# Patient Record
Sex: Female | Born: 1965 | Race: Black or African American | Hispanic: No | Smoking: Former smoker
Health system: Southern US, Community
[De-identification: ages and names within clinical notes are randomized; demographics above are authoritative.]

## PROBLEM LIST (undated history)

## (undated) DIAGNOSIS — M549 Dorsalgia, unspecified: Secondary | ICD-10-CM

## (undated) DIAGNOSIS — J449 Chronic obstructive pulmonary disease, unspecified: Secondary | ICD-10-CM

## (undated) DIAGNOSIS — F419 Anxiety disorder, unspecified: Secondary | ICD-10-CM

## (undated) DIAGNOSIS — F4310 Post-traumatic stress disorder, unspecified: Secondary | ICD-10-CM

## (undated) DIAGNOSIS — N200 Calculus of kidney: Secondary | ICD-10-CM

## (undated) DIAGNOSIS — G8929 Other chronic pain: Secondary | ICD-10-CM

## (undated) DIAGNOSIS — J439 Emphysema, unspecified: Secondary | ICD-10-CM

## (undated) DIAGNOSIS — M792 Neuralgia and neuritis, unspecified: Secondary | ICD-10-CM

## (undated) DIAGNOSIS — M797 Fibromyalgia: Secondary | ICD-10-CM

## (undated) HISTORY — PX: TONSILLECTOMY: SUR1361

## (undated) HISTORY — PX: TUBAL LIGATION: SHX77

## (undated) HISTORY — PX: OTHER SURGICAL HISTORY: SHX169

## (undated) SURGERY — EGD (ESOPHAGOGASTRODUODENOSCOPY)
Anesthesia: Monitor Anesthesia Care

---

## 2002-03-16 ENCOUNTER — Emergency Department (HOSPITAL_COMMUNITY): Admission: EM | Admit: 2002-03-16 | Discharge: 2002-03-17 | Payer: Self-pay | Admitting: *Deleted

## 2002-04-28 ENCOUNTER — Emergency Department (HOSPITAL_COMMUNITY): Admission: EM | Admit: 2002-04-28 | Discharge: 2002-04-28 | Payer: Self-pay | Admitting: Emergency Medicine

## 2002-04-28 ENCOUNTER — Encounter: Payer: Self-pay | Admitting: Emergency Medicine

## 2002-06-22 ENCOUNTER — Emergency Department (HOSPITAL_COMMUNITY): Admission: EM | Admit: 2002-06-22 | Discharge: 2002-06-23 | Payer: Self-pay | Admitting: Emergency Medicine

## 2002-06-23 ENCOUNTER — Encounter: Payer: Self-pay | Admitting: Emergency Medicine

## 2004-01-05 ENCOUNTER — Emergency Department (HOSPITAL_COMMUNITY): Admission: EM | Admit: 2004-01-05 | Discharge: 2004-01-05 | Payer: Self-pay | Admitting: Emergency Medicine

## 2006-06-02 ENCOUNTER — Emergency Department (HOSPITAL_COMMUNITY): Admission: EM | Admit: 2006-06-02 | Discharge: 2006-06-03 | Payer: Self-pay | Admitting: Emergency Medicine

## 2006-11-03 ENCOUNTER — Emergency Department (HOSPITAL_COMMUNITY): Admission: EM | Admit: 2006-11-03 | Discharge: 2006-11-04 | Payer: Self-pay | Admitting: Emergency Medicine

## 2007-01-01 ENCOUNTER — Emergency Department (HOSPITAL_COMMUNITY): Admission: EM | Admit: 2007-01-01 | Discharge: 2007-01-01 | Payer: Self-pay | Admitting: Emergency Medicine

## 2007-08-01 ENCOUNTER — Emergency Department (HOSPITAL_COMMUNITY): Admission: EM | Admit: 2007-08-01 | Discharge: 2007-08-01 | Payer: Self-pay | Admitting: Emergency Medicine

## 2007-10-12 ENCOUNTER — Encounter: Admission: RE | Admit: 2007-10-12 | Discharge: 2007-10-12 | Payer: Self-pay | Admitting: Orthopedic Surgery

## 2008-04-23 ENCOUNTER — Emergency Department (HOSPITAL_COMMUNITY): Admission: EM | Admit: 2008-04-23 | Discharge: 2008-04-23 | Payer: Self-pay | Admitting: Emergency Medicine

## 2009-01-05 ENCOUNTER — Emergency Department (HOSPITAL_COMMUNITY): Admission: EM | Admit: 2009-01-05 | Discharge: 2009-01-05 | Payer: Self-pay | Admitting: Emergency Medicine

## 2009-09-18 ENCOUNTER — Emergency Department (HOSPITAL_COMMUNITY): Admission: EM | Admit: 2009-09-18 | Discharge: 2009-09-18 | Payer: Self-pay | Admitting: Emergency Medicine

## 2009-11-14 ENCOUNTER — Emergency Department (HOSPITAL_COMMUNITY): Admission: EM | Admit: 2009-11-14 | Discharge: 2009-11-14 | Payer: Self-pay | Admitting: Emergency Medicine

## 2010-09-16 ENCOUNTER — Emergency Department (HOSPITAL_COMMUNITY): Admission: EM | Admit: 2010-09-16 | Discharge: 2010-09-16 | Payer: Self-pay | Admitting: Emergency Medicine

## 2011-03-12 LAB — COMPREHENSIVE METABOLIC PANEL
AST: 12 U/L (ref 0–37)
Alkaline Phosphatase: 47 U/L (ref 39–117)
Calcium: 8.6 mg/dL (ref 8.4–10.5)
GFR calc non Af Amer: >60 mL/min (ref >60)
Glucose, Bld: 83 mg/dL (ref 70–99)
Potassium: 3.4 mEq/L — ABNORMAL LOW (ref 3.5–5.1)
Sodium: 145 mEq/L (ref 135–145)
Total Bilirubin: 0.8 mg/dL (ref 0.3–1.2)

## 2011-03-12 LAB — DIFFERENTIAL
Eosinophils Absolute: 0 10*3/uL (ref 0.0–0.7)
Eosinophils Relative: 0 % (ref 0–5)
Lymphocytes Relative: 16 % (ref 12–46)
Lymphs Abs: 1.2 10*3/uL (ref 0.7–4.0)

## 2011-03-12 LAB — URINALYSIS, ROUTINE W REFLEX MICROSCOPIC
Glucose, UA: NEGATIVE mg/dL
Nitrite: NEGATIVE
Urobilinogen, UA: 0.2 mg/dL (ref 0.0–1.0)
pH: 6 (ref 5.0–8.0)

## 2011-03-12 LAB — CBC
HCT: 42.3 % (ref 36.0–46.0)
RBC: 4.4 MIL/uL (ref 3.87–5.11)

## 2011-03-12 LAB — PREGNANCY, URINE: Preg Test, Ur: NEGATIVE

## 2011-03-14 LAB — POCT I-STAT, CHEM 8
Creatinine, Ser: 1 mg/dL (ref 0.4–1.2)
HCT: 53 % — ABNORMAL HIGH (ref 36.0–46.0)
Hemoglobin: 18 g/dL — ABNORMAL HIGH (ref 12.0–15.0)
Sodium: 143 mEq/L (ref 135–145)

## 2011-03-25 LAB — DIFFERENTIAL
Basophils Absolute: 0 10*3/uL (ref 0.0–0.1)
Basophils Relative: 1 % (ref 0–1)
Eosinophils Absolute: 0.1 10*3/uL (ref 0.0–0.7)
Eosinophils Relative: 1 % (ref 0–5)
Lymphocytes Relative: 13 % (ref 12–46)
Lymphs Abs: 1.2 10*3/uL (ref 0.7–4.0)
Monocytes Absolute: 1.2 10*3/uL — ABNORMAL HIGH (ref 0.1–1.0)
Monocytes Relative: 12 % (ref 3–12)
Neutro Abs: 7.4 10*3/uL (ref 1.7–7.7)
Neutrophils Relative %: 74 % (ref 43–77)

## 2011-03-25 LAB — BASIC METABOLIC PANEL
BUN: 6 mg/dL (ref 6–23)
Calcium: 9.1 mg/dL (ref 8.4–10.5)
Chloride: 112 mEq/L (ref 96–112)
Creatinine, Ser: 0.66 mg/dL (ref 0.4–1.2)
GFR calc Af Amer: >60 mL/min (ref >60)
Glucose, Bld: 88 mg/dL (ref 70–99)
Sodium: 141 mEq/L (ref 135–145)

## 2011-03-25 LAB — CBC: RDW: 13.5 % (ref 11.5–15.5)

## 2011-03-25 LAB — POCT CARDIAC MARKERS: CKMB, poc: <1 ng/mL — ABNORMAL LOW (ref 1.0–8.0)

## 2011-04-16 ENCOUNTER — Inpatient Hospital Stay (HOSPITAL_COMMUNITY)
Admission: EM | Admit: 2011-04-16 | Discharge: 2011-04-18 | DRG: 897 | Disposition: A | Discharge status: Home / Self Care | Payer: Medicare Other | Attending: Internal Medicine | Admitting: Internal Medicine

## 2011-04-16 DIAGNOSIS — F4310 Post-traumatic stress disorder, unspecified: Secondary | ICD-10-CM | POA: Diagnosis present

## 2011-04-16 DIAGNOSIS — G4700 Insomnia, unspecified: Secondary | ICD-10-CM | POA: Diagnosis present

## 2011-04-16 DIAGNOSIS — F17200 Nicotine dependence, unspecified, uncomplicated: Secondary | ICD-10-CM | POA: Diagnosis present

## 2011-04-16 DIAGNOSIS — D72829 Elevated white blood cell count, unspecified: Secondary | ICD-10-CM | POA: Diagnosis present

## 2011-04-16 DIAGNOSIS — E860 Dehydration: Secondary | ICD-10-CM | POA: Diagnosis present

## 2011-04-16 DIAGNOSIS — F19939 Other psychoactive substance use, unspecified with withdrawal, unspecified: Principal | ICD-10-CM | POA: Diagnosis present

## 2011-04-16 DIAGNOSIS — E876 Hypokalemia: Secondary | ICD-10-CM | POA: Diagnosis present

## 2011-04-16 DIAGNOSIS — J309 Allergic rhinitis, unspecified: Secondary | ICD-10-CM | POA: Diagnosis present

## 2011-04-16 DIAGNOSIS — IMO0001 Reserved for inherently not codable concepts without codable children: Secondary | ICD-10-CM | POA: Diagnosis present

## 2011-04-16 DIAGNOSIS — N179 Acute kidney failure, unspecified: Secondary | ICD-10-CM | POA: Diagnosis present

## 2011-04-16 DIAGNOSIS — G589 Mononeuropathy, unspecified: Secondary | ICD-10-CM | POA: Diagnosis present

## 2011-04-16 DIAGNOSIS — F341 Dysthymic disorder: Secondary | ICD-10-CM | POA: Diagnosis present

## 2011-04-16 DIAGNOSIS — Z79899 Other long term (current) drug therapy: Secondary | ICD-10-CM

## 2011-04-16 DIAGNOSIS — F1120 Opioid dependence, uncomplicated: Secondary | ICD-10-CM | POA: Diagnosis present

## 2011-04-16 DIAGNOSIS — G8929 Other chronic pain: Secondary | ICD-10-CM | POA: Diagnosis present

## 2011-04-16 LAB — DIFFERENTIAL
Basophils Absolute: 0 10*3/uL (ref 0.0–0.1)
Basophils Relative: 0 % (ref 0–1)
Eosinophils Absolute: 0 10*3/uL (ref 0.0–0.7)
Lymphocytes Relative: 16 % (ref 12–46)
Monocytes Absolute: 1.1 10*3/uL — ABNORMAL HIGH (ref 0.1–1.0)

## 2011-04-16 LAB — URINE MICROSCOPIC-ADD ON

## 2011-04-16 LAB — COMPREHENSIVE METABOLIC PANEL
ALT: 15 U/L (ref 0–35)
AST: 17 U/L (ref 0–37)
Albumin: 5.7 g/dL — ABNORMAL HIGH (ref 3.5–5.2)
Alkaline Phosphatase: 92 U/L (ref 39–117)
Creatinine, Ser: 1.94 mg/dL — ABNORMAL HIGH (ref 0.4–1.2)
Glucose, Bld: 129 mg/dL — ABNORMAL HIGH (ref 70–99)

## 2011-04-16 LAB — CBC
HCT: 51.7 % — ABNORMAL HIGH (ref 36.0–46.0)
Hemoglobin: 18 g/dL — ABNORMAL HIGH (ref 12.0–15.0)
MCH: 32.3 pg (ref 26.0–34.0)
MCV: 92.8 fL (ref 78.0–100.0)
RBC: 5.57 MIL/uL — ABNORMAL HIGH (ref 3.87–5.11)
WBC: 15.6 10*3/uL — ABNORMAL HIGH (ref 4.0–10.5)

## 2011-04-16 LAB — URINALYSIS, ROUTINE W REFLEX MICROSCOPIC
Leukocytes, UA: NEGATIVE
Specific Gravity, Urine: 1.022 (ref 1.005–1.030)

## 2011-04-16 LAB — LIPASE, BLOOD: Lipase: 26 U/L (ref 11–59)

## 2011-04-17 LAB — URINE CULTURE
Colony Count: 50000
Culture  Setup Time: 201205081501

## 2011-04-17 LAB — COMPREHENSIVE METABOLIC PANEL
ALT: 9 U/L (ref 0–35)
Albumin: 2.8 g/dL — ABNORMAL LOW (ref 3.5–5.2)
BUN: 13 mg/dL (ref 6–23)
Chloride: 119 mEq/L — ABNORMAL HIGH (ref 96–112)
Potassium: 3.3 mEq/L — ABNORMAL LOW (ref 3.5–5.1)
Sodium: 143 mEq/L (ref 135–145)
Total Protein: 5.4 g/dL — ABNORMAL LOW (ref 6.0–8.3)

## 2011-04-17 LAB — POCT PREGNANCY, URINE: Preg Test, Ur: NEGATIVE

## 2011-04-18 LAB — BASIC METABOLIC PANEL
Calcium: 8.6 mg/dL (ref 8.4–10.5)
GFR calc Af Amer: >60 mL/min (ref >60)
GFR calc non Af Amer: >60 mL/min (ref >60)
Glucose, Bld: 80 mg/dL (ref 70–99)
Potassium: 4 mEq/L (ref 3.5–5.1)
Sodium: 139 mEq/L (ref 135–145)

## 2011-04-18 LAB — CBC
Platelets: 143 10*3/uL — ABNORMAL LOW (ref 150–400)
WBC: 7 10*3/uL (ref 4.0–10.5)

## 2011-05-01 NOTE — Discharge Summary (Signed)
Michele Lucas, Michele Lucas               ACCOUNT NO.:  [id number]  MEDICAL RECORD NO.:  [id number]           PATIENT TYPE:  I  LOCATION:  1511                         FACILITY[facility]  [facility]  PHYSICIAN:  Rosanna Randy, MDDATE OF BIRTH:  1966-04-27  DATE OF ADMISSION:  04/16/2011 DATE OF DISCHARGE:  04/18/2011                              DISCHARGE SUMMARY   PRIMARY CARE PHYSICIAN:  Dr. Clare Gandy at [facility]  DISCHARGE DIAGNOSES: 1. Nausea, vomiting, diarrhea secondary to opiate withdrawal. 2. Dehydration with acute kidney injury. 3. Hypokalemia. 4. Depression. 5. Anxiety. 6. Chronic back pain, on chronic narcotic use. 7. Fibromyalgia. 8. Post-traumatic stress disorder. 9. Neuropathy. 10.Tobacco abuse. 11.Insomnia. 12.Allergic rhinitis. 13.Reactive leukocytosis.  DISCHARGE MEDICATIONS: 1. Claritin 10 mg by mouth daily. 2. Lyrica 75 mg by mouth twice a day. 3. Ambien 10 mg 1 tablet by mouth daily at bedtime as needed for     insomnia. 4. Remeron 15 mg 1 tablet by mouth daily at bedtime. 5. Morphine sulfate 15 mg tablet to be taken every 8 hours schedule. 6. Oxycodone 5 mg 1 tablet by mouth 4 times a day as needed for     breakthrough pain. 7. Sertraline 50 mg 1 tablet by mouth daily.  DISPOSITION AND FOLLOWUP:  The patient has been discharged in stable and improved condition, currently not having any nausea, vomiting or diarrhea; able to tolerate full diet by mouth and is also not having any electrolyte abnormalities with completely back to normal renal function. The patient had been instructed to arrange a followup appointment with her primary care physician Dr. Charlott Holler in 5 to 10 days in order to review her chronic medical problems and to have any adjustment on her medications.  It is going to be also important during that appointment to review necessity of either a higher dose for her pain control which is still present and also to calculate the amount of narcotics  that she need to receive in order to prevent that the patient ran out earlier and experienced any symptoms similar to this opiate withdrawal that she just went through.  PROCEDURES:  Procedures performed during this hospitalization:  No procedures were performed during this admission.  No consultations were made during this admission.  BRIEF HISTORY OF PRESENT ILLNESS:  Michele Lucas is a 45 year old African- American female with a history of chronic back pain and chronic opiate use who presents with 1-week history of nausea, vomiting, diarrhea and abdominal pain.  The patient reports that she stopped her opiate medications about a week ago when she ran out of her pills.  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  She had gone to see them on April 25 at which time her medications had been refilled and were reportedly going to be mailed to her but they never arrived.  She therefore stopped her medications as previously stated one week ago when she ran out.  All the symptoms began following that.  The patient has been unable to eat or drink significantly since that time secondary to her associated symptoms.  The patient denies any fevers or any dysuria.  She denies any other symptoms like headache, chest pain, shortness of breath.  Also denies any blood in her stools.  The patient reports that she also has had some upper respiratory type of symptoms including nasal congestion and dry cough with some postnasal drip.  ADMISSION PHYSICAL EXAMINATION:  VITAL SIGNS:  On admission, the patient's vital signs demonstrated a temperature of 98.3 with a heart rate of 130, blood pressure 113/79, respiratory rate 20, oxygen saturation 99% on room air. GENERAL:  In general the patient was in moderate distress. HEENT:  With dry mucous membranes.  Normocephalic and without any trauma.  There was no ectasia.  No exudates or erythema inside her mouth. CARDIOVASCULAR EXAM:  Tachycardic, regular  rhythm.  No murmurs, rubs or gallops. LUNGS:  Clear to auscultation with good air movement and just scattered wheezing. ABDOMEN:  Soft, mildly tender to palpation, nondistended with positive bowel sounds. EXTREMITIES:  Able to move all extremities with no cyanosis, clubbing or edema. NEUROLOGIC EXAM:  Unremarkable.  PERTINENT LABORATORY DATA:  During this hospitalization on admission the patient had a CBC that demonstrated white blood cells of 15.6, hemoglobin 18, platelets 243.  She also had a comprehensive metabolic panel on admission that demonstrated a sodium of 138, potassium 3.4, chloride 100, bicarb 20, blood sugar 129, BUN 25, creatinine 1.94 with a GFR of 34.  LFTs were completely normal except for mild elevation in the patient's albumin up to 5.7 and also a total protein of 10.0.  Lipase was 26.  Urinalysis was completely normal.  Urine pregnancy was negative.  There was a negative urine culture.  Subsequent comprehensive metabolic panel the next day after admission demonstrated a sodium of 143 with a potassium of 3.3 and chloride of 119 with a bicarb of 17, glucose of 88, BUN 13 with a creatinine of 0.51, now her GFR was more than 60 and her liver function tests were now back to normal.  HOSPITAL COURSE BY PROBLEM: 1. Nausea, vomiting, diarrhea secondary to opiate withdrawal.  The     patient was restarted on her opiates initially IV where she was     unable to take p.o.'s with subsequent advance in her diet and also     medications by mouth.  Her symptoms rapidly responded.  She was no     longer having any nausea, vomiting or diarrhea and she was already     feeling a lot much better after fluid resuscitation and     electrolytes repletion.  The patient had received a prescription     for 1 month supply and has been instructed to follow with her     primary care physician for the next 5 to 10 days for further     adjustment of her medications and to get further refills  on her     meds.. 2. Dehydration with acute kidney injury, looking everything to be     secondary to prerenal azotemia.  At this point, the patient has     received fluid resuscitation and electrolytes repletion and at     discharge the patient's renal function and also electrolytes are     back to normal range. 3. Hypokalemia which is secondary to combination of volume contraction     and her diarrhea due to opiate withdrawal.  She received     electrolyte repletion throughout this admission and her potassium     is normal at discharge. 4. Depression and anxiety.  Plan is to continue using  mirtazapine and     Zoloft. 5. Chronic back pain.  She is going to continue follow with her     primary care physician for this problem and is going to be using     morphine sulfate 3 times a day with oxycodone p.r.n. for     breakthrough pain. 6. Fibromyalgia and neuropathy.  She is going to be using Lyrica, the     dose has been adjusted to 75 mg by mouth twice a day due to the     component of fibromyalgia as one of her problems.. 7. Tobacco abuse.  She received counseling throughout this admission     and was placed on nicotine patch.  She is planning to quit and we     have really encouraged her to get further assistance as an  outpatient.  Resources were provided by social work. 8. Insomnia.  We are going to continue using p.r.n. zolpidem. 9. Allergic rhinitis.  We are going to continue using Claritin daily. 10.Reactive leukocytosis all secondary to stress and demargination     which completely returned to normal after fluid resuscitation.  DISCHARGE PHYSICAL EXAMINATION:  VITAL SIGNS:  At discharge, the patient's vital signs were temperature 99.2, heart rate 76, respiratory rate 20, blood pressure 148/73, oxygen saturation 98% on room air. GENERAL:  In general, she was in no acute distress. RESPIRATORY SYSTEM:  Clear to sensation bilaterally. HEART:  Regular rate and rhythm. ABDOMEN:   Soft, nontender, nondistended with positive bowel sounds. EXTREMITIES:  Without any edema. NEUROLOGIC EXAM:  Nonfocal.  LABORATORY DATA:  Her lab work at discharge demonstrated white blood cells of 7.0 with a hemoglobin of 12, platelets 150.  Basic metabolic panel showed a sodium of 139, potassium 4.0, chloride 114, bicarb 20, BUN 6, creatinine 0.47, blood sugar was 80.     Rosanna Randy, MD     CEM/MEDQ  D:  04/18/2011  T:  04/18/2011  Job:  [id number]  cc:   Dr. Clare Gandy at [facility]  Electronically Signed by Vassie Loll MD on 05/01/2011 10:20:32 PM

## 2011-09-21 ENCOUNTER — Emergency Department (HOSPITAL_COMMUNITY)
Admission: EM | Admit: 2011-09-21 | Discharge: 2011-09-21 | Disposition: A | Discharge status: Home / Self Care | Payer: Medicare Other | Attending: Emergency Medicine | Admitting: Emergency Medicine

## 2011-09-21 DIAGNOSIS — K08409 Partial loss of teeth, unspecified cause, unspecified class: Secondary | ICD-10-CM | POA: Insufficient documentation

## 2011-09-21 DIAGNOSIS — K029 Dental caries, unspecified: Secondary | ICD-10-CM | POA: Insufficient documentation

## 2011-09-21 DIAGNOSIS — K089 Disorder of teeth and supporting structures, unspecified: Secondary | ICD-10-CM | POA: Insufficient documentation

## 2011-09-21 DIAGNOSIS — K08109 Complete loss of teeth, unspecified cause, unspecified class: Secondary | ICD-10-CM | POA: Insufficient documentation

## 2011-10-07 ENCOUNTER — Emergency Department (HOSPITAL_COMMUNITY): Payer: Medicare Other

## 2011-10-07 ENCOUNTER — Emergency Department (HOSPITAL_COMMUNITY)
Admission: EM | Admit: 2011-10-07 | Discharge: 2011-10-07 | Disposition: A | Discharge status: Home / Self Care | Payer: Medicare Other | Attending: Emergency Medicine | Admitting: Emergency Medicine

## 2011-10-07 DIAGNOSIS — F4310 Post-traumatic stress disorder, unspecified: Secondary | ICD-10-CM | POA: Insufficient documentation

## 2011-10-07 DIAGNOSIS — G609 Hereditary and idiopathic neuropathy, unspecified: Secondary | ICD-10-CM | POA: Insufficient documentation

## 2011-10-07 DIAGNOSIS — F411 Generalized anxiety disorder: Secondary | ICD-10-CM | POA: Insufficient documentation

## 2011-10-07 DIAGNOSIS — IMO0001 Reserved for inherently not codable concepts without codable children: Secondary | ICD-10-CM | POA: Insufficient documentation

## 2011-10-07 DIAGNOSIS — Z7982 Long term (current) use of aspirin: Secondary | ICD-10-CM | POA: Insufficient documentation

## 2011-10-07 DIAGNOSIS — Z79899 Other long term (current) drug therapy: Secondary | ICD-10-CM | POA: Insufficient documentation

## 2011-10-07 DIAGNOSIS — F3289 Other specified depressive episodes: Secondary | ICD-10-CM | POA: Insufficient documentation

## 2011-10-07 DIAGNOSIS — J40 Bronchitis, not specified as acute or chronic: Secondary | ICD-10-CM | POA: Insufficient documentation

## 2011-10-07 DIAGNOSIS — F329 Major depressive disorder, single episode, unspecified: Secondary | ICD-10-CM | POA: Insufficient documentation

## 2011-10-07 LAB — DIFFERENTIAL
Basophils Absolute: 0 10*3/uL (ref 0.0–0.1)
Lymphocytes Relative: 21 % (ref 12–46)
Lymphs Abs: 1.8 10*3/uL (ref 0.7–4.0)
Neutro Abs: 6 10*3/uL (ref 1.7–7.7)
Neutrophils Relative %: 72 % (ref 43–77)

## 2011-10-07 LAB — BASIC METABOLIC PANEL
BUN: 11 mg/dL (ref 6–23)
Chloride: 106 mEq/L (ref 96–112)
GFR calc Af Amer: >90 mL/min (ref >90)
GFR calc non Af Amer: >90 mL/min (ref >90)
Glucose, Bld: 95 mg/dL (ref 70–99)
Potassium: 3.6 mEq/L (ref 3.5–5.1)
Sodium: 139 mEq/L (ref 135–145)

## 2011-10-07 LAB — URINALYSIS, ROUTINE W REFLEX MICROSCOPIC
Bilirubin Urine: NEGATIVE
Hgb urine dipstick: NEGATIVE
Specific Gravity, Urine: 1.028 (ref 1.005–1.030)
Urobilinogen, UA: 1 mg/dL (ref 0.0–1.0)
pH: 6 (ref 5.0–8.0)

## 2011-10-07 LAB — CBC
HCT: 39.7 % (ref 36.0–46.0)
Hemoglobin: 13.5 g/dL (ref 12.0–15.0)
MCV: 94.7 fL (ref 78.0–100.0)
RBC: 4.19 MIL/uL (ref 3.87–5.11)
RDW: 14.3 % (ref 11.5–15.5)
WBC: 8.4 10*3/uL (ref 4.0–10.5)

## 2011-10-18 ENCOUNTER — Ambulatory Visit (HOSPITAL_COMMUNITY)
Admission: RE | Admit: 2011-10-18 | Discharge: 2011-10-18 | Disposition: A | Discharge status: Home / Self Care | Payer: Medicare Other | Source: Ambulatory Visit | Attending: Psychiatry | Admitting: Psychiatry

## 2011-10-18 DIAGNOSIS — F1120 Opioid dependence, uncomplicated: Secondary | ICD-10-CM | POA: Insufficient documentation

## 2011-10-18 NOTE — BH Assessment (Signed)
Assessment Note   Michele Lucas is an 45 y.o. female.   Axis I: OPIOID DEPENDENCE Axis II: Deferred Axis III: No past medical history on file. Axis IV: problems with access to health care services Axis V: 51-60 moderate symptoms  Past Medical History: No past medical history on file.  No past surgical history on file.  Family History: No family history on file.  Social History:  does not have a smoking history on file. She does not have any smokeless tobacco history on file. Her alcohol and drug histories not on file.  Allergies: Allergies not on file  Home Medications:  No current outpatient prescriptions on file as of 10/18/2011.   No current facility-administered medications on file as of 10/18/2011.    OB/GYN Status:  No LMP recorded.  General Assessment Data Assessment Number: 1  Living Arrangements: Spouse/significant other;Children;Relatives Can pt return to current living arrangement?: Yes Admission Status: Voluntary Is patient capable of signing voluntary admission?: Yes Transfer from: Home Referral Source: Self/Family/Friend  Risk to self Suicidal Ideation: No Suicidal Intent: No-Not Currently/Within Last 6 Months Is patient at risk for suicide?: No Suicidal Plan?: No Access to Means: No What has been your use of drugs/alcohol within the last 12 months?: PT HAS BEEN DEPENDENT ON NARCATICS WHICH SHE HAS BEEN ON FOR MANY YRS Other Self Harm Risks: NA Triggers for Past Attempts: Other (Comment) (NA) Intentional Self Injurious Behavior: None Factors that decrease suicide risk: Positive coping skills Family Suicide History: Unknown Recent stressful life event(s): Other (Comment) (MEDICAL ISSUES, NARCOTICS DEPENDENCE) Persecutory voices/beliefs?: No Depression: Yes Depression Symptoms: Loss of interest in usual pleasures Substance abuse history and/or treatment for substance abuse?: Yes  Risk to Others Homicidal Ideation: No Thoughts of Harm to Others:  No Current Homicidal Plan: No Access to Homicidal Means: No Identified Victim:  (NA) History of harm to others?: No Assessment of Violence: None Noted Violent Behavior Description: NA Does patient have access to weapons?: No Criminal Charges Pending?: No Does patient have a court date: No  Mental Status Report Appear/Hygiene: Other (Comment) (NEAT) Eye Contact: Good Motor Activity: Unsteady Speech: Slurred;Slow Level of Consciousness: Alert;Drowsy Mood: Depressed;Sad;Preoccupied Affect: Depressed;Sad Anxiety Level: None Thought Processes: Coherent;Relevant Judgement: Impaired Orientation: Person;Place;Time;Situation Obsessive Compulsive Thoughts/Behaviors: None  Cognitive Functioning Concentration: Decreased Memory: Recent Intact;Remote Intact IQ: Average Insight: Poor Impulse Control: Poor Appetite: Poor Weight Loss:  (UNK) Weight Gain:  (UNK) Sleep: No Change Total Hours of Sleep:  (UNK) Vegetative Symptoms: None  Prior Inpatient/Outpatient Therapy Prior Therapy: Outpatient Prior Therapy Dates: UNK Prior Therapy Facilty/Provider(s): [facility] Reason for Treatment: THERAPY & MED MANAGEMENT   PT PRESENTED REQUESTING MEDICAL HELP TO WEENE OFF NARCOTICS MEDS THAT PCP/PROVIDER HAS PUT HER ON. PT STATES SHE DID NOT WANT TO EXPERIENCE ANY WITHDRAWAL & STATE SHE WOULD BE WILLING TO DO DETOX IF SPOUSE WAS ON IN HOSPITAL GETTING READY TO UNDERGO SURGERY. PT STATES SHE HAS A LOT OF MEDICAL ISSUES WHICH SHE HAS BEEN DEALING WITH ALONG TIME. PT ADMITS BEING ON OXYCODONE & MORPHINE WHICH SHE WANTS TO GET OFF. PT ADMITS TO HAVING CRYING SPELLS SOMETIMES & SUICIDAL THOUGHTS WHICH COME & GO. PT ADMITS STILL DEALING WITH CHILDHOOD ABUSE. PT RANKS HER PAIN AT A LEVEL OF 22. PT SAYS SHE IS AFRAID TO HAVE BACK SURGERY DUE TO POSSIBLE SIDE AFFECTS SHE MAY HAVE. PT STATES SHE HAS BEEN DIGNOISED WITH PTSD; ENDOMETREOSIS; FIBROMYALAGIA; NEUROPATHY; CHRONIC BACK PAIN & ANXIETY D/O. PT WAS  INFORMED THAT [facility] DID NOT WEENE OFF NARCOTICS &  WOULD BE REFERRED TO PROVIDER. PT EXPRESSED THAT SHE WANTED TO BE IN A LONG TERM DETOX PROGRAM. PT WAS GIVEN INFORMATION ABOUT OUT CD/PSYCH IOP. PT STATES SHE WOULD GO BACK [facility] FOR SERVICE                  Additional Information 1:1 In Past 12 Months?: No CIRT Risk: No Elopement Risk: No Does patient have medical clearance?: No     Disposition:  Disposition Disposition of Patient: Referred to;Outpatient treatment [facility]) Patient referred to: Other (Comment) ( [facility])  On Site Evaluation by:   Reviewed with Physician:     Waldron Session 10/18/2011 7:12 PM

## 2012-03-15 ENCOUNTER — Emergency Department (HOSPITAL_COMMUNITY)
Admission: EM | Admit: 2012-03-15 | Discharge: 2012-03-15 | Disposition: A | Discharge status: Home / Self Care | Payer: Medicare Other | Attending: Emergency Medicine | Admitting: Emergency Medicine

## 2012-03-15 ENCOUNTER — Encounter (HOSPITAL_COMMUNITY): Payer: Self-pay | Admitting: Emergency Medicine

## 2012-03-15 DIAGNOSIS — K0510 Chronic gingivitis, plaque induced: Secondary | ICD-10-CM | POA: Insufficient documentation

## 2012-03-15 DIAGNOSIS — J4489 Other specified chronic obstructive pulmonary disease: Secondary | ICD-10-CM | POA: Insufficient documentation

## 2012-03-15 DIAGNOSIS — G8929 Other chronic pain: Secondary | ICD-10-CM | POA: Insufficient documentation

## 2012-03-15 DIAGNOSIS — K0889 Other specified disorders of teeth and supporting structures: Secondary | ICD-10-CM

## 2012-03-15 DIAGNOSIS — K089 Disorder of teeth and supporting structures, unspecified: Secondary | ICD-10-CM | POA: Insufficient documentation

## 2012-03-15 DIAGNOSIS — J449 Chronic obstructive pulmonary disease, unspecified: Secondary | ICD-10-CM | POA: Insufficient documentation

## 2012-03-15 HISTORY — DX: Anxiety disorder, unspecified: F41.9

## 2012-03-15 HISTORY — DX: Fibromyalgia: M79.7

## 2012-03-15 HISTORY — DX: Chronic obstructive pulmonary disease, unspecified: J44.9

## 2012-03-15 HISTORY — DX: Post-traumatic stress disorder, unspecified: F43.10

## 2012-03-15 HISTORY — DX: Other chronic pain: G89.29

## 2012-03-15 HISTORY — DX: Neuralgia and neuritis, unspecified: M79.2

## 2012-03-15 HISTORY — DX: Dorsalgia, unspecified: M54.9

## 2012-03-15 MED ORDER — OXYCODONE-ACETAMINOPHEN 5-325 MG PO TABS
ORAL_TABLET | ORAL | Status: AC
  Filled 2012-03-15: qty 1

## 2012-03-15 MED ORDER — OXYCODONE-ACETAMINOPHEN 5-325 MG PO TABS
1.0000 | ORAL_TABLET | Freq: Once | ORAL | Status: AC
  Administered 2012-03-15: 1 via ORAL
  Filled 2012-03-15: qty 1

## 2012-03-15 MED ORDER — CLINDAMYCIN HCL 300 MG PO CAPS
300.0000 mg | ORAL_CAPSULE | Freq: Once | ORAL | Status: AC
  Administered 2012-03-15: 300 mg via ORAL
  Filled 2012-03-15: qty 1

## 2012-03-15 MED ORDER — CLINDAMYCIN HCL 150 MG PO CAPS: 150.0000 mg | ORAL_CAPSULE | Freq: Four times a day (QID) | ORAL | Status: AC

## 2012-03-15 MED ORDER — CHLORHEXIDINE GLUCONATE 0.12 % MT SOLN: 15.0000 mL | Freq: Two times a day (BID) | OROMUCOSAL | Status: AC

## 2012-03-15 MED ORDER — OXYCODONE-ACETAMINOPHEN 5-325 MG PO TABS
1.0000 | ORAL_TABLET | Freq: Once | ORAL | Status: AC
  Administered 2012-03-15: 1 via ORAL

## 2012-03-15 MED ORDER — OXYCODONE-ACETAMINOPHEN 5-325 MG PO TABS: 1.0000 | ORAL_TABLET | Freq: Four times a day (QID) | ORAL | Status: AC | PRN

## 2012-03-15 NOTE — ED Notes (Signed)
Pt presented to the ER with dental pain, states pain started about a week ago, however, not able to make an appt until Monday, pain increasing, 10/10 at this time, pt states that 7 teeth that she has left in the mouth are all painful and infected.

## 2012-03-15 NOTE — Discharge Instructions (Signed)
Return to the ED with any concerns including fever, difficulty breathing or swallowing, swelling under your tongue, vomiting and not able to keep down liquids or antibiotics, or any other alarming symptoms

## 2012-03-15 NOTE — ED Provider Notes (Signed)
History     CSN: [id number]  Arrival date & time 03/15/12  0001   First MD Initiated Contact with Patient 03/15/12 0321      Chief Complaint  Patient presents with  . Dental Pain    (Consider location/radiation/quality/duration/timing/severity/associated sxs/prior treatment) HPI PT presents with c/o dental pain.  She has widespread dental decay and gum pain.  She has had pain over the past several days, has been trying ibuprofen without any relief.  No fever, no difficulty swallowing or breathing.  Pt has had to have multiple teeth pulled in the past and needs to have the remaining 7 teeth pulled as well. She states she has an appointment in 2 days with her dentist.    Past Medical History  Diagnosis Date  . Endometriosis   . PTSD (post-traumatic stress disorder)   . Neuropathic pain   . Chronic back pain   . Fibromyalgia   . COPD, mild   . Anxiety     Past Surgical History  Procedure Date  . Tubal ligation   . Copolscopy   . Tonsillectomy   . Cesarean section     History reviewed. No pertinent family history.  History  Substance Use Topics  . Smoking status: Current Everyday Smoker  . Smokeless tobacco: Not on file  . Alcohol Use: Yes    OB History    Grav Para Term Preterm Abortions TAB SAB Ect Mult Living                  Review of Systems ROS reviewed and all otherwise negative except for mentioned in HPI  Allergies  Ampicillin  Home Medications   Current Outpatient Rx  Name Route Sig Dispense Refill  . CHLORHEXIDINE GLUCONATE 0.12 % MT SOLN Mouth/Throat Use as directed 15 mLs in the mouth or throat 2 (two) times daily. 120 mL 0  . CLINDAMYCIN HCL 150 MG PO CAPS Oral Take 1 capsule (150 mg total) by mouth every 6 (six) hours. 28 capsule 0  . OXYCODONE-ACETAMINOPHEN 5-325 MG PO TABS Oral Take 1-2 tablets by mouth every 6 (six) hours as needed for pain. 15 tablet 0    BP 112/70  Pulse 90  Temp(Src) 99.1 F (37.3 C) (Oral)  Resp 16  Wt 130 lb  (58.968 kg)  SpO2 96% Vitals reviewed Physical Exam Physical Examination: General appearance - alert, well appearing, and in no distress Mental status - alert, oriented to person, place, and time Mouth - mucous membranes moist, pharynx normal without lesions, almost edentulous- has approx 7 teeth remaining which are very decayed, diffuse gingival irritation, no swelling under tongue Neck - supple, no significant adenopathy Chest - clear to auscultation, no wheezes, rales or rhonchi, symmetric air entry Heart - normal rate, regular rhythm, normal S1, S2, no murmurs, rubs, clicks or gallops Abdomen - soft, nontender, nondistended, no masses or organomegaly Extremities - peripheral pulses normal, no pedal edema, no clubbing or cyanosis Skin - normal coloration and turgor, no rashes  ED Course  Procedures (including critical care time)  Labs Reviewed - No data to display No results found.   1. Pain, dental   2. Gingivitis       MDM  Pt with numerous teeth with signficant erosion and decay, majority of teeth have already been pulled.  Diffuse gingival irritation and redness, no periapical abscess visualized.  Pt given pain meds as well as clindamycin for infection.  Will also prescribe peridex oral rinse for gingivitis.  Pt states she  has an appointment 03/16/12 with her dentist.          Ethelda Chick, MD 03/18/12 [phone]

## 2012-03-15 NOTE — ED Notes (Signed)
Patient ambulatory with steady gait. Respirations equal and unlabored. Skin warm and dry. No acute distress noted. 

## 2012-05-20 ENCOUNTER — Emergency Department (HOSPITAL_COMMUNITY)
Admission: EM | Admit: 2012-05-20 | Discharge: 2012-05-20 | Disposition: A | Discharge status: Home / Self Care | Payer: Medicare Other | Attending: Emergency Medicine | Admitting: Emergency Medicine

## 2012-05-20 ENCOUNTER — Encounter (HOSPITAL_COMMUNITY): Payer: Self-pay

## 2012-05-20 DIAGNOSIS — F4310 Post-traumatic stress disorder, unspecified: Secondary | ICD-10-CM | POA: Insufficient documentation

## 2012-05-20 DIAGNOSIS — R600 Localized edema: Secondary | ICD-10-CM

## 2012-05-20 DIAGNOSIS — M79609 Pain in unspecified limb: Secondary | ICD-10-CM | POA: Insufficient documentation

## 2012-05-20 DIAGNOSIS — M79604 Pain in right leg: Secondary | ICD-10-CM

## 2012-05-20 DIAGNOSIS — G8929 Other chronic pain: Secondary | ICD-10-CM | POA: Insufficient documentation

## 2012-05-20 DIAGNOSIS — R609 Edema, unspecified: Secondary | ICD-10-CM | POA: Insufficient documentation

## 2012-05-20 DIAGNOSIS — IMO0001 Reserved for inherently not codable concepts without codable children: Secondary | ICD-10-CM | POA: Insufficient documentation

## 2012-05-20 DIAGNOSIS — J4489 Other specified chronic obstructive pulmonary disease: Secondary | ICD-10-CM | POA: Insufficient documentation

## 2012-05-20 DIAGNOSIS — F17200 Nicotine dependence, unspecified, uncomplicated: Secondary | ICD-10-CM | POA: Insufficient documentation

## 2012-05-20 DIAGNOSIS — R21 Rash and other nonspecific skin eruption: Secondary | ICD-10-CM

## 2012-05-20 DIAGNOSIS — J449 Chronic obstructive pulmonary disease, unspecified: Secondary | ICD-10-CM | POA: Insufficient documentation

## 2012-05-20 MED ORDER — FAMOTIDINE 20 MG PO TABS: 20.0000 mg | ORAL_TABLET | Freq: Two times a day (BID) | ORAL | Status: DC

## 2012-05-20 MED ORDER — PREDNISONE 20 MG PO TABS
20.0000 mg | ORAL_TABLET | Freq: Once | ORAL | Status: AC
  Administered 2012-05-20: 20 mg via ORAL
  Filled 2012-05-20: qty 1

## 2012-05-20 MED ORDER — FAMOTIDINE 20 MG PO TABS
20.0000 mg | ORAL_TABLET | Freq: Once | ORAL | Status: AC
  Administered 2012-05-20: 20 mg via ORAL
  Filled 2012-05-20: qty 1

## 2012-05-20 MED ORDER — DIPHENHYDRAMINE HCL 25 MG PO CAPS: 25.0000 mg | ORAL_CAPSULE | Freq: Four times a day (QID) | ORAL | Status: DC | PRN

## 2012-05-20 MED ORDER — PREDNISONE 10 MG PO TABS: 20.0000 mg | ORAL_TABLET | Freq: Every day | ORAL | Status: DC

## 2012-05-20 MED ORDER — DIPHENHYDRAMINE HCL 25 MG PO CAPS
25.0000 mg | ORAL_CAPSULE | Freq: Once | ORAL | Status: AC
  Administered 2012-05-20: 25 mg via ORAL
  Filled 2012-05-20: qty 1

## 2012-05-20 NOTE — Discharge Instructions (Signed)
I suspect your rash is due to a drug reaction. Stop clindamycin. Take benadryl, pepcid, prednisone as prescribed. Keep legs elevated.   Drug Rash Skin reactions can be caused by several different drugs. Allergy to the medicine can cause itching, hives, and other rashes. Sun exposure causes a red rash with some medicines. Mononucleosis virus can cause a similar red rash when you are taking antibiotics. Sometimes, the rash may be accompanied by pain. The drug rash may happen with new drugs or with medicines that you have been taking for a while. The rash cannot be spread from person to person. In most cases, the symptoms of a drug rash are gone within a few days of stopping the medicine. Your rash, including hives (urticaria), is most likely from the following medicines:  Antibiotics or antimicrobials.   Anticonvulsants or seizure medicines.   Antihypertensives or blood pressure medicines.   Antimalarials.   Antidepressants or depression medicines.   Antianxiety drugs.   Diuretics or water pills.   Nonsteroidal anti-inflammatory drugs.   Simvastatin.   Lithium.   Omeprazole.   Allopurinol.   Pseudoephedrine.   Amiodarone.   Packed red blood cells, when you get a blood transfusion.   Contrast media, such as when getting an imaging test (CT or CAT scan).  This drug list is not all inclusive, but drug rashes have been reported with all the medicines listed above.Your caregiver will tell you which medicines to avoid. If you react to a medicine, a similar or worse reaction can occur the next time you take it. If you need to stop taking an antibiotic because of a drug rash, an alternative antibiotic may be needed to get rid of your infection. Antihistamine or cortisone drugs may be prescribed to help relieve your symptoms. Stay out of the sun until the rash is completely gone.  Be sure to let your caregiver know about your drug reaction. Do not take this medicine in the future. Call  your caregiver if your drug rash does not improve within 3 to 4 days. SEEK IMMEDIATE MEDICAL CARE IF:   You develop breathing problems, swelling in the throat, or wheezing.   You have weakness, fainting, fever, and muscle or joint pains.   You develop blisters or peeling of skin, especially around the mouth.  Document Released: 01/02/2005 Document Revised: 11/14/2011 Document Reviewed: 10/13/2008 [facility] Patient Information 2012 [place], [place].

## 2012-05-20 NOTE — ED Notes (Signed)
Bilat leg swelling & pain w/red splotches x 3 days.  States this happens Q 3-4 months.  Denies shob.

## 2012-05-20 NOTE — ED Provider Notes (Signed)
Medical screening examination/treatment/procedure(s) were conducted as a shared visit with non-physician practitioner(s) and myself.  I personally evaluated the patient during the encounter  Pt with nonspecific Bilat. LE redness and swelling; after starting on Clindamycin.    Flint Melter, MD 05/22/12 [phone]

## 2012-05-20 NOTE — ED Notes (Signed)
Pt states redness and swelling to feet and ankles bilateral increasing since Monday. Pt states pain. Warm to touch.

## 2012-05-20 NOTE — ED Provider Notes (Signed)
History     CSN: [id number]  Arrival date & time 05/20/12  2054   First MD Initiated Contact with Patient 05/20/12 2207      Chief Complaint  Patient presents with  . Leg Pain  . Leg Swelling    (Consider location/radiation/quality/duration/timing/severity/associated sxs/prior treatment) Patient is a 46 y.o. female presenting with leg pain. The history is provided by the patient.  Leg Pain  The incident occurred 2 days ago. There was no injury mechanism. The quality of the pain is described as aching and burning. The pain is at a severity of 7/10. The pain is moderate. The pain has been constant since onset. Pertinent negatives include no numbness.  Pt states she developed bilateral lower leg rash and swelling 2 days ago. States lower legs are very painful. Hx of the same, has happened twice in the last year, always resolves on its own. States it is very painful. Pt taking morphine and percocet at home with no relief. She denies any injury. Denies fever, chills, malaise. Pt does admits to starting on clindamycin 7days ago for tooth infection. States she has taken it in the past with no problems. Pt denies chest pain or sob.   Past Medical History  Diagnosis Date  . Endometriosis   . PTSD (post-traumatic stress disorder)   . Neuropathic pain   . Chronic back pain   . Fibromyalgia   . COPD, mild   . Anxiety     Past Surgical History  Procedure Date  . Tubal ligation   . Copolscopy   . Tonsillectomy   . Cesarean section     History reviewed. No pertinent family history.  History  Substance Use Topics  . Smoking status: Current Everyday Smoker  . Smokeless tobacco: Not on file  . Alcohol Use: Yes    OB History    Grav Para Term Preterm Abortions TAB SAB Ect Mult Living                  Review of Systems  Constitutional: Negative for fever and chills.  Respiratory: Negative.   Cardiovascular: Negative.   Musculoskeletal: Positive for myalgias and joint swelling.    Skin: Positive for color change.  Neurological: Negative for dizziness, weakness and numbness.    Allergies  Ampicillin  Home Medications   Current Outpatient Rx  Name Route Sig Dispense Refill  . ALBUTEROL SULFATE HFA 108 (90 BASE) MCG/ACT IN AERS Inhalation Inhale 2 puffs into the lungs every 6 (six) hours as needed. Shortness of breath    . VITAMIN D 1000 UNITS PO TABS Oral Take 1,000 Units by mouth daily.    Marland Kitchen CLINDAMYCIN HCL 150 MG PO CAPS Oral Take 150 mg by mouth 3 (three) times daily.    Marland Kitchen MEDROXYPROGESTERONE ACETATE 150 MG/ML IM SUSP Intramuscular Inject 150 mg into the muscle every 3 (three) months.    Marland Kitchen MIRTAZAPINE 15 MG PO TABS Oral Take 15 mg by mouth at bedtime.    . MORPHINE SULFATE ER 60 MG PO TBCR Oral Take 60 mg by mouth every 8 (eight) hours.    . OXYCODONE HCL 5 MG PO TABS Oral Take 5 mg by mouth every 4 (four) hours as needed. Pain    . PREGABALIN 50 MG PO CAPS Oral Take 50 mg by mouth 2 (two) times daily.    . SERTRALINE HCL 50 MG PO TABS Oral Take 50 mg by mouth daily.    Marland Kitchen ZOLPIDEM TARTRATE 10 MG PO TABS Oral  Take 10 mg by mouth at bedtime as needed.      BP 125/63  Pulse 113  Temp 98.5 F (36.9 C) (Oral)  Resp 20  Ht 5\' 2"  (1.575 m)  Wt 140 lb (63.504 kg)  BMI 25.61 kg/m2  SpO2 97%  Physical Exam  Vitals reviewed. Constitutional: She is oriented to person, place, and time. She appears well-developed and well-nourished. No distress.  HENT:  Head: Normocephalic.       Normal oral mucosa, no rash or lesions  Neck: Neck supple.  Cardiovascular: Normal rate.   Murmur heard. Pulmonary/Chest: Effort normal and breath sounds normal. No respiratory distress. She has no wheezes.  Musculoskeletal:       Swelling of bilateral feet and ankle up to mid shin. There is erythema over the lower shins, circumferential, extending from mid shin to the top of the ankle. Rash is non blanching. Tender, warm to the touch.   Neurological: She is alert and oriented to  person, place, and time.  Skin: Skin is warm and dry.       See Musculoskeletal exam  Psychiatric: She has a normal mood and affect.    ED Course  Procedures (including critical care time)  Lower extremity edema and rash. Possible vasculitis vs allergic reaction to clindamycin. Doubt cellulitis, pt afebrile, non toxic, rash is bilateral. Pt discussed with Dr. Effie Shy who has seen pt as well. Will treat with steroids, H1 and H2 blockers. Pt has appointment for follow up with pcp tomorrow.     1. Bilateral lower extremity edema   2. Bilateral lower extremity pain   3. Rash       MDM          Lottie Mussel, PA 05/21/12 0139

## 2012-12-05 ENCOUNTER — Emergency Department (HOSPITAL_COMMUNITY)
Admission: EM | Admit: 2012-12-05 | Discharge: 2012-12-05 | Disposition: A | Discharge status: Home / Self Care | Payer: Medicare Other | Attending: Emergency Medicine | Admitting: Emergency Medicine

## 2012-12-05 ENCOUNTER — Encounter (HOSPITAL_COMMUNITY): Payer: Self-pay | Admitting: Emergency Medicine

## 2012-12-05 DIAGNOSIS — M549 Dorsalgia, unspecified: Secondary | ICD-10-CM | POA: Insufficient documentation

## 2012-12-05 DIAGNOSIS — F17200 Nicotine dependence, unspecified, uncomplicated: Secondary | ICD-10-CM | POA: Insufficient documentation

## 2012-12-05 DIAGNOSIS — IMO0001 Reserved for inherently not codable concepts without codable children: Secondary | ICD-10-CM | POA: Insufficient documentation

## 2012-12-05 DIAGNOSIS — N809 Endometriosis, unspecified: Secondary | ICD-10-CM | POA: Insufficient documentation

## 2012-12-05 DIAGNOSIS — J4489 Other specified chronic obstructive pulmonary disease: Secondary | ICD-10-CM | POA: Insufficient documentation

## 2012-12-05 DIAGNOSIS — J449 Chronic obstructive pulmonary disease, unspecified: Secondary | ICD-10-CM | POA: Insufficient documentation

## 2012-12-05 DIAGNOSIS — H6120 Impacted cerumen, unspecified ear: Secondary | ICD-10-CM | POA: Insufficient documentation

## 2012-12-05 DIAGNOSIS — G8929 Other chronic pain: Secondary | ICD-10-CM | POA: Insufficient documentation

## 2012-12-05 DIAGNOSIS — F411 Generalized anxiety disorder: Secondary | ICD-10-CM | POA: Insufficient documentation

## 2012-12-05 DIAGNOSIS — F4310 Post-traumatic stress disorder, unspecified: Secondary | ICD-10-CM | POA: Insufficient documentation

## 2012-12-05 DIAGNOSIS — Z79899 Other long term (current) drug therapy: Secondary | ICD-10-CM | POA: Insufficient documentation

## 2012-12-05 NOTE — ED Notes (Signed)
Pt states that she needs her right ear irrigated.  She was trying to clean out her ears with a q tip last night but thinks that she pushed it further back and thinks that the end of the q tip may still be in her ears.  States this has happened before.

## 2012-12-05 NOTE — ED Provider Notes (Signed)
History     CSN: [id number]  Arrival date & time 12/05/12  0865   First MD Initiated Contact with Patient 12/05/12 [phone]      Chief Complaint  Patient presents with  . Cerumen Impaction    (Consider location/radiation/quality/duration/timing/severity/associated sxs/prior treatment) HPI Comments: This is a 46 year old female, who presents emergency department with chief complaint of earwax impaction. Patient states that she is using a Q-tip last night, when she noticed that her hearing decreased. She believes that she passed the earwax further into the ear canal. She states that her symptoms are moderate in severity. There are no aggravating or alleviating factors. She denies any other symptoms at this time.  The history is provided by the patient. No language interpreter was used.    Past Medical History  Diagnosis Date  . Endometriosis   . PTSD (post-traumatic stress disorder)   . Neuropathic pain   . Chronic back pain   . Fibromyalgia   . COPD, mild   . Anxiety     Past Surgical History  Procedure Date  . Tubal ligation   . Copolscopy   . Tonsillectomy   . Cesarean section     No family history on file.  History  Substance Use Topics  . Smoking status: Current Every Day Smoker  . Smokeless tobacco: Not on file  . Alcohol Use: Yes    OB History    Grav Para Term Preterm Abortions TAB SAB Ect Mult Living                  Review of Systems  All other systems reviewed and are negative.    Allergies  Ampicillin  Home Medications   Current Outpatient Rx  Name  Route  Sig  Dispense  Refill  . ALBUTEROL SULFATE HFA 108 (90 BASE) MCG/ACT IN AERS   Inhalation   Inhale 2 puffs into the lungs every 6 (six) hours as needed. Shortness of breath         . VITAMIN D 1000 UNITS PO TABS   Oral   Take 1,000 Units by mouth daily.         Marland Kitchen CLINDAMYCIN HCL 150 MG PO CAPS   Oral   Take 150 mg by mouth 3 (three) times daily.         Marland Kitchen DIPHENHYDRAMINE HCL  25 MG PO CAPS   Oral   Take 1 capsule (25 mg total) by mouth every 6 (six) hours as needed for itching.   30 capsule   0   . FAMOTIDINE 20 MG PO TABS   Oral   Take 1 tablet (20 mg total) by mouth 2 (two) times daily.   30 tablet   0   . MEDROXYPROGESTERONE ACETATE 150 MG/ML IM SUSP   Intramuscular   Inject 150 mg into the muscle every 3 (three) months.         Marland Kitchen MIRTAZAPINE 15 MG PO TABS   Oral   Take 15 mg by mouth at bedtime.         . MORPHINE SULFATE ER 60 MG PO TBCR   Oral   Take 60 mg by mouth every 8 (eight) hours.         . OXYCODONE HCL 5 MG PO TABS   Oral   Take 5 mg by mouth every 4 (four) hours as needed. Pain         . PREDNISONE 10 MG PO TABS   Oral   Take 2  tablets (20 mg total) by mouth daily.   10 tablet   0   . PREGABALIN 50 MG PO CAPS   Oral   Take 50 mg by mouth 2 (two) times daily.         . SERTRALINE HCL 50 MG PO TABS   Oral   Take 50 mg by mouth daily.         Marland Kitchen ZOLPIDEM TARTRATE 10 MG PO TABS   Oral   Take 10 mg by mouth at bedtime as needed.           BP 112/69  Pulse 89  Temp 98 F (36.7 C) (Oral)  Resp 16  SpO2 98%  Physical Exam  Nursing note and vitals reviewed. Constitutional: She is oriented to person, place, and time. She appears well-developed and well-nourished.  HENT:  Head: Normocephalic and atraumatic.  Left Ear: External ear normal.       Right ear cerumen impaction  Eyes: Conjunctivae normal and EOM are normal. Pupils are equal, round, and reactive to light.  Neck: Normal range of motion. Neck supple.  Cardiovascular: Normal rate and regular rhythm.  Exam reveals no friction rub.   Pulmonary/Chest: Effort normal. No respiratory distress.  Musculoskeletal: Normal range of motion.  Neurological: She is alert and oriented to person, place, and time.  Skin: Skin is warm and dry.  Psychiatric: She has a normal mood and affect. Her behavior is normal. Judgment and thought content normal.    ED  Course  Procedures (including critical care time)  Labs Reviewed - No data to display No results found.   1. Cerumen impaction       MDM  46 year old female with cerumen impaction. This rim and has been successfully removed by the nurse. Patient feels much better. I will discharge the patient to home with proper ear cleaning instructions. Patient understands and agrees with the plan. She is stable and ready for discharge.        Roxy Horseman, PA-C 12/05/12 1114

## 2012-12-05 NOTE — ED Provider Notes (Signed)
Medical screening examination/treatment/procedure(s) were performed by non-physician practitioner and as supervising physician I was immediately available for consultation/collaboration.   Upton Russey Y. Adora Yeh, MD 12/05/12 1142 

## 2012-12-05 NOTE — ED Notes (Signed)
Pt escorted to discharge window. Pt verbalized understanding discharge instructions. In no acute distress.  

## 2013-07-17 ENCOUNTER — Encounter (HOSPITAL_COMMUNITY): Payer: Self-pay | Admitting: Emergency Medicine

## 2013-07-17 ENCOUNTER — Emergency Department (HOSPITAL_COMMUNITY)
Admission: EM | Admit: 2013-07-17 | Discharge: 2013-07-17 | Disposition: A | Discharge status: Home / Self Care | Payer: Medicare Other | Attending: Emergency Medicine | Admitting: Emergency Medicine

## 2013-07-17 DIAGNOSIS — L03319 Cellulitis of trunk, unspecified: Secondary | ICD-10-CM | POA: Insufficient documentation

## 2013-07-17 DIAGNOSIS — L02214 Cutaneous abscess of groin: Secondary | ICD-10-CM

## 2013-07-17 DIAGNOSIS — IMO0002 Reserved for concepts with insufficient information to code with codable children: Secondary | ICD-10-CM | POA: Insufficient documentation

## 2013-07-17 DIAGNOSIS — IMO0001 Reserved for inherently not codable concepts without codable children: Secondary | ICD-10-CM | POA: Insufficient documentation

## 2013-07-17 DIAGNOSIS — F17200 Nicotine dependence, unspecified, uncomplicated: Secondary | ICD-10-CM | POA: Insufficient documentation

## 2013-07-17 DIAGNOSIS — Z8659 Personal history of other mental and behavioral disorders: Secondary | ICD-10-CM | POA: Insufficient documentation

## 2013-07-17 DIAGNOSIS — L02219 Cutaneous abscess of trunk, unspecified: Secondary | ICD-10-CM | POA: Insufficient documentation

## 2013-07-17 DIAGNOSIS — N949 Unspecified condition associated with female genital organs and menstrual cycle: Secondary | ICD-10-CM | POA: Insufficient documentation

## 2013-07-17 DIAGNOSIS — Z79899 Other long term (current) drug therapy: Secondary | ICD-10-CM | POA: Insufficient documentation

## 2013-07-17 DIAGNOSIS — J4489 Other specified chronic obstructive pulmonary disease: Secondary | ICD-10-CM | POA: Insufficient documentation

## 2013-07-17 DIAGNOSIS — N809 Endometriosis, unspecified: Secondary | ICD-10-CM | POA: Insufficient documentation

## 2013-07-17 DIAGNOSIS — G8929 Other chronic pain: Secondary | ICD-10-CM | POA: Insufficient documentation

## 2013-07-17 DIAGNOSIS — J449 Chronic obstructive pulmonary disease, unspecified: Secondary | ICD-10-CM | POA: Insufficient documentation

## 2013-07-17 MED ORDER — OXYCODONE-ACETAMINOPHEN 5-325 MG PO TABS: 1.0000 | ORAL_TABLET | ORAL | Status: DC | PRN

## 2013-07-17 MED ORDER — OXYCODONE-ACETAMINOPHEN 5-325 MG PO TABS
1.0000 | ORAL_TABLET | Freq: Once | ORAL | Status: AC
  Administered 2013-07-17: 1 via ORAL
  Filled 2013-07-17: qty 1

## 2013-07-17 NOTE — ED Provider Notes (Signed)
CSN: [id number]     Arrival date & time 07/17/13  1547 History     First MD Initiated Contact with Patient 07/17/13 1622     Chief Complaint  Patient presents with  . vaginal abcess    (Consider location/radiation/quality/duration/timing/severity/associated sxs/prior Treatment) Patient is a 47 y.o. female presenting with abscess. The history is provided by the patient.  Abscess Location:  Ano-genital Ano-genital abscess location:  Vagina Abscess quality: draining, painful and redness   Associated symptoms: no fever   Associated symptoms comment:  Painful swelling that started as a "hair bump" that she tried to open at home. No fever. She reports history of the same.    Past Medical History  Diagnosis Date  . Endometriosis   . PTSD (post-traumatic stress disorder)   . Neuropathic pain   . Chronic back pain   . Fibromyalgia   . COPD, mild   . Anxiety    Past Surgical History  Procedure Laterality Date  . Tubal ligation    . Copolscopy    . Tonsillectomy    . Cesarean section     No family history on file. History  Substance Use Topics  . Smoking status: Current Every Day Smoker  . Smokeless tobacco: Not on file  . Alcohol Use: Yes   OB History   Grav Para Term Preterm Abortions TAB SAB Ect Mult Living                 Review of Systems  Constitutional: Negative for fever.  Gastrointestinal: Negative for abdominal pain.  Genitourinary: Positive for vaginal pain. Negative for dysuria.  Skin: Positive for wound.    Allergies  Guaifenesin & derivatives; Ampicillin; and Clindamycin/lincomycin  Home Medications   Current Outpatient Rx  Name  Route  Sig  Dispense  Refill  . albuterol (PROVENTIL HFA;VENTOLIN HFA) 108 (90 BASE) MCG/ACT inhaler   Inhalation   Inhale 2 puffs into the lungs every 6 (six) hours as needed. Shortness of breath         . fluticasone (FLONASE) 50 MCG/ACT nasal spray   Nasal   Place 2 sprays into the nose daily.         Marland Kitchen morphine  (MS CONTIN) 60 MG 12 hr tablet   Oral   Take 60 mg by mouth every 12 (twelve) hours.          Marland Kitchen oxyCODONE (OXY IR/ROXICODONE) 5 MG immediate release tablet   Oral   Take 15 mg by mouth 3 (three) times daily. Pain         . pregabalin (LYRICA) 50 MG capsule   Oral   Take 50 mg by mouth every morning.          . medroxyPROGESTERone (DEPO-PROVERA) 150 MG/ML injection   Intramuscular   Inject 150 mg into the muscle every 3 (three) months.          BP 155/67  Temp(Src) 98.3 F (36.8 C) (Oral)  Resp 18  SpO2 96% Physical Exam  Constitutional: She is oriented to person, place, and time. She appears well-developed and well-nourished.  Neck: Normal range of motion.  Pulmonary/Chest: Effort normal.  Genitourinary:  No vulvar or vaginal swelling or tenderness.   Neurological: She is alert and oriented to person, place, and time.  Skin: Skin is warm and dry.  Painful swollen area to left inguinal fold with minimal drainage c/w abscess.     ED Course   Procedures (including critical care time) INCISION AND DRAINAGE Performed  by: Shilo Philipson A Consent: Verbal consent obtained. Risks and benefits: risks, benefits and alternatives were discussed Type: abscess  Body area: left inguinal fold  Anesthesia: local infiltration  Incision was made with a scalpel.  Local anesthetic: lidocaine 2% w/o epinephrine  Anesthetic total: 1 ml  Complexity: complex Blunt dissection to break up loculations  Drainage: purulent  Drainage amount: minimal  Packing material: 1/4 in iodoform gauze  Patient tolerance: Patient tolerated the procedure well with no immediate complications.    Labs Reviewed - No data to display No results found. No diagnosis found. 1. Abscess  MDM  Simple cutaneous abscess with I&D.  Arnoldo Hooker, PA-C 07/17/13 1818

## 2013-07-17 NOTE — ED Notes (Signed)
Pt states that last week she had an ingrown hair in her vaginal area and tried to pull all the hair out and cleaned it the next day she woke up with the abcess the size of her thumb. Pt states that she has tried squeezing all the infection out but it getting bigger and more sore and swollen.

## 2013-07-22 NOTE — ED Provider Notes (Signed)
Medical screening examination/treatment/procedure(s) were performed by non-physician practitioner and as supervising physician I was immediately available for consultation/collaboration.  Jalynne Persico T Lailie Smead, MD 07/22/13 1218 

## 2013-11-16 ENCOUNTER — Encounter (HOSPITAL_COMMUNITY): Payer: Self-pay | Admitting: Emergency Medicine

## 2013-11-16 ENCOUNTER — Emergency Department (HOSPITAL_COMMUNITY)
Admission: EM | Admit: 2013-11-16 | Discharge: 2013-11-16 | Disposition: A | Discharge status: Home / Self Care | Payer: Medicare Other | Attending: Emergency Medicine | Admitting: Emergency Medicine

## 2013-11-16 DIAGNOSIS — J449 Chronic obstructive pulmonary disease, unspecified: Secondary | ICD-10-CM | POA: Insufficient documentation

## 2013-11-16 DIAGNOSIS — Z79899 Other long term (current) drug therapy: Secondary | ICD-10-CM | POA: Insufficient documentation

## 2013-11-16 DIAGNOSIS — IMO0001 Reserved for inherently not codable concepts without codable children: Secondary | ICD-10-CM | POA: Insufficient documentation

## 2013-11-16 DIAGNOSIS — M2550 Pain in unspecified joint: Secondary | ICD-10-CM | POA: Insufficient documentation

## 2013-11-16 DIAGNOSIS — R000 Tachycardia, unspecified: Secondary | ICD-10-CM | POA: Insufficient documentation

## 2013-11-16 DIAGNOSIS — F411 Generalized anxiety disorder: Secondary | ICD-10-CM | POA: Insufficient documentation

## 2013-11-16 DIAGNOSIS — Z880 Allergy status to penicillin: Secondary | ICD-10-CM | POA: Insufficient documentation

## 2013-11-16 DIAGNOSIS — G8929 Other chronic pain: Secondary | ICD-10-CM | POA: Insufficient documentation

## 2013-11-16 DIAGNOSIS — F17200 Nicotine dependence, unspecified, uncomplicated: Secondary | ICD-10-CM | POA: Insufficient documentation

## 2013-11-16 DIAGNOSIS — J4489 Other specified chronic obstructive pulmonary disease: Secondary | ICD-10-CM | POA: Insufficient documentation

## 2013-11-16 DIAGNOSIS — M79609 Pain in unspecified limb: Secondary | ICD-10-CM | POA: Insufficient documentation

## 2013-11-16 DIAGNOSIS — Z8742 Personal history of other diseases of the female genital tract: Secondary | ICD-10-CM | POA: Insufficient documentation

## 2013-11-16 DIAGNOSIS — IMO0002 Reserved for concepts with insufficient information to code with codable children: Secondary | ICD-10-CM | POA: Insufficient documentation

## 2013-11-16 MED ORDER — BACLOFEN 10 MG PO TABS: 10.0000 mg | ORAL_TABLET | Freq: Three times a day (TID) | ORAL | Status: DC

## 2013-11-16 MED ORDER — KETOROLAC TROMETHAMINE 30 MG/ML IJ SOLN
30.0000 mg | Freq: Once | INTRAMUSCULAR | Status: AC
  Administered 2013-11-16: 30 mg via INTRAMUSCULAR
  Filled 2013-11-16: qty 1

## 2013-11-16 NOTE — ED Provider Notes (Addendum)
CSN: [id number]     Arrival date & time 11/16/13  0054 History   First MD Initiated Contact with Patient 11/16/13 0104     Chief Complaint  Patient presents with  . CHRONIC Leg Pain     bilateral   (Consider location/radiation/quality/duration/timing/severity/associated sxs/prior Treatment) HPI Comments: Patient presents with bilateral leg pain, onset @1  hour ago. Patient states this happens at least 3 times per month, states she saw a spine specialist [employer]) who told her she has "leaking discs that are pressing on the nerves in her legs" Patient states this has been going on for @ 2 years. Patient was advised she needs back surgery but does not want it, has not scheduled it. Patient states it feels like both legs have charley horses in them. Patient states pain is improved with walking. Patient took 20mg  Oxycodone at 1230-45 at home.  The history is provided by the patient.    Past Medical History  Diagnosis Date  . Endometriosis   . PTSD (post-traumatic stress disorder)   . Neuropathic pain   . Chronic back pain   . Fibromyalgia   . COPD, mild   . Anxiety    Past Surgical History  Procedure Laterality Date  . Tubal ligation    . Copolscopy    . Tonsillectomy    . Cesarean section     History reviewed. No pertinent family history. History  Substance Use Topics  . Smoking status: Current Every Day Smoker -- 1.00 packs/day    Types: Cigarettes  . Smokeless tobacco: Not on file  . Alcohol Use: Yes     Comment: denies   OB History   Grav Para Term Preterm Abortions TAB SAB Ect Mult Living                 Review of Systems  Musculoskeletal: Positive for arthralgias. Negative for back pain and joint swelling.  Skin: Negative for rash and wound.  Neurological: Negative for weakness and numbness.    Allergies  Guaifenesin & derivatives; Ampicillin; and Clindamycin/lincomycin  Home Medications   Current Outpatient Rx  Name  Route  Sig  Dispense   Refill  . albuterol (PROVENTIL HFA;VENTOLIN HFA) 108 (90 BASE) MCG/ACT inhaler   Inhalation   Inhale 2 puffs into the lungs every 6 (six) hours as needed. Shortness of breath         . baclofen (LIORESAL) 10 MG tablet   Oral   Take 1 tablet (10 mg total) by mouth 3 (three) times daily.   30 each   0   . fluticasone (FLONASE) 50 MCG/ACT nasal spray   Nasal   Place 2 sprays into the nose daily.         . medroxyPROGESTERone (DEPO-PROVERA) 150 MG/ML injection   Intramuscular   Inject 150 mg into the muscle every 3 (three) months.         . morphine (MS CONTIN) 60 MG 12 hr tablet   Oral   Take 60 mg by mouth every 12 (twelve) hours.          Marland Kitchen oxyCODONE (OXY IR/ROXICODONE) 5 MG immediate release tablet   Oral   Take 15 mg by mouth 3 (three) times daily. Pain         . oxyCODONE-acetaminophen (PERCOCET/ROXICET) 5-325 MG per tablet   Oral   Take 1 tablet by mouth every 4 (four) hours as needed for pain.   12 tablet   0   . pregabalin (LYRICA) 50  MG capsule   Oral   Take 50 mg by mouth every morning.           BP 140/78  Pulse 124  Temp(Src) 98.2 F (36.8 C) (Oral)  Resp 16  SpO2 97% Physical Exam  Nursing note and vitals reviewed. Constitutional: She appears well-developed and well-nourished.  HENT:  Head: Normocephalic.  Eyes: Pupils are equal, round, and reactive to light.  Neck: Normal range of motion.  Cardiovascular: Regular rhythm.  Tachycardia present.   Crying and aggitated  Pulmonary/Chest: Effort normal.  Musculoskeletal: Normal range of motion. She exhibits tenderness. She exhibits no edema.       Legs:   ED Course  Procedures (including critical care time) Labs Review Labs Reviewed - No data to display Imaging Review No results found.  EKG Interpretation   None       MDM   1. Chronic leg pain, left   2. Chronic leg pain, right    Patient states, that she no longer has a local primary care provider, that she is being  followed at the [facility] in [place], and she is waiting for referral to the pain clinic Patient took 20 mg of oxycodone prior to arrival.  With this in mind I will treat her with IM Toradol, and give her a prescription for a muscle relaxer, and suggestion followup with her doctors for further evaluation   Arman Filter, NP 11/16/13 0159  Arman Filter, NP 12/01/13 [phone]

## 2013-11-16 NOTE — ED Provider Notes (Signed)
Medical screening examination/treatment/procedure(s) were performed by non-physician practitioner and as supervising physician I was immediately available for consultation/collaboration.  EKG Interpretation   None        Layla Maw Dejon Lukas, DO 11/16/13 [phone]

## 2013-11-16 NOTE — ED Notes (Signed)
Patient presents with bilateral leg pain, onset @1  hour ago. Patient states this happens at least 3 times per month, states she saw a spine specialist [employer]) who told her she has "leaking discs that are pressing on the nerves in her legs" Patient states this has been going on for @ 2 years. Patient was advised she needs back surgery but does not want it, has not scheduled it. Patient states it feels like both legs have charley horses in them. Patient states pain is improved with walking. Patient took 20mg  Oxycodone at 1230-45 at home.

## 2013-11-16 NOTE — ED Notes (Signed)
Gail, NP at bedside. 

## 2013-12-02 NOTE — ED Provider Notes (Signed)
Medical screening examination/treatment/procedure(s) were performed by non-physician practitioner and as supervising physician I was immediately available for consultation/collaboration.  EKG Interpretation   None          Layla Maw Wonda Goodgame, DO 12/02/13 [phone]

## 2015-04-01 ENCOUNTER — Emergency Department (HOSPITAL_COMMUNITY): Payer: Medicare Other

## 2015-04-01 ENCOUNTER — Encounter (HOSPITAL_COMMUNITY): Payer: Self-pay | Admitting: Emergency Medicine

## 2015-04-01 ENCOUNTER — Inpatient Hospital Stay (HOSPITAL_COMMUNITY)
Admission: EM | Admit: 2015-04-01 | Discharge: 2015-04-04 | DRG: 194 | Disposition: A | Discharge status: Home / Self Care | Payer: Medicare Other | Attending: Internal Medicine | Admitting: Internal Medicine

## 2015-04-01 DIAGNOSIS — F17210 Nicotine dependence, cigarettes, uncomplicated: Secondary | ICD-10-CM | POA: Diagnosis present

## 2015-04-01 DIAGNOSIS — J189 Pneumonia, unspecified organism: Secondary | ICD-10-CM | POA: Diagnosis present

## 2015-04-01 DIAGNOSIS — R0602 Shortness of breath: Secondary | ICD-10-CM | POA: Diagnosis present

## 2015-04-01 DIAGNOSIS — R8290 Unspecified abnormal findings in urine: Secondary | ICD-10-CM | POA: Diagnosis present

## 2015-04-01 DIAGNOSIS — D649 Anemia, unspecified: Secondary | ICD-10-CM | POA: Diagnosis present

## 2015-04-01 DIAGNOSIS — J449 Chronic obstructive pulmonary disease, unspecified: Secondary | ICD-10-CM | POA: Diagnosis present

## 2015-04-01 DIAGNOSIS — J441 Chronic obstructive pulmonary disease with (acute) exacerbation: Secondary | ICD-10-CM | POA: Diagnosis present

## 2015-04-01 DIAGNOSIS — Z79899 Other long term (current) drug therapy: Secondary | ICD-10-CM

## 2015-04-01 DIAGNOSIS — F411 Generalized anxiety disorder: Secondary | ICD-10-CM | POA: Diagnosis present

## 2015-04-01 DIAGNOSIS — F1120 Opioid dependence, uncomplicated: Secondary | ICD-10-CM | POA: Diagnosis present

## 2015-04-01 DIAGNOSIS — H9190 Unspecified hearing loss, unspecified ear: Secondary | ICD-10-CM | POA: Diagnosis present

## 2015-04-01 DIAGNOSIS — G894 Chronic pain syndrome: Secondary | ICD-10-CM | POA: Diagnosis present

## 2015-04-01 DIAGNOSIS — F4310 Post-traumatic stress disorder, unspecified: Secondary | ICD-10-CM | POA: Diagnosis present

## 2015-04-01 DIAGNOSIS — M797 Fibromyalgia: Secondary | ICD-10-CM | POA: Diagnosis present

## 2015-04-01 DIAGNOSIS — E876 Hypokalemia: Secondary | ICD-10-CM | POA: Diagnosis present

## 2015-04-01 DIAGNOSIS — J42 Unspecified chronic bronchitis: Secondary | ICD-10-CM | POA: Diagnosis not present

## 2015-04-01 LAB — CBC WITH DIFFERENTIAL/PLATELET
Basophils Absolute: 0 10*3/uL (ref 0.0–0.1)
Basophils Relative: 0 % (ref 0–1)
Eosinophils Absolute: 0.1 10*3/uL (ref 0.0–0.7)
Eosinophils Relative: 1 % (ref 0–5)
HEMATOCRIT: 38.4 % (ref 36.0–46.0)
Hemoglobin: 12.2 g/dL (ref 12.0–15.0)
LYMPHS ABS: 1.6 10*3/uL (ref 0.7–4.0)
LYMPHS PCT: 20 % (ref 12–46)
MCH: 30 pg (ref 26.0–34.0)
MCHC: 31.8 g/dL (ref 30.0–36.0)
MCV: 94.6 fL (ref 78.0–100.0)
MONO ABS: 1.3 10*3/uL — AB (ref 0.1–1.0)
Monocytes Relative: 17 % — ABNORMAL HIGH (ref 3–12)
Neutro Abs: 5 10*3/uL (ref 1.7–7.7)
Neutrophils Relative %: 62 % (ref 43–77)
Platelets: 249 10*3/uL (ref 150–400)
RBC: 4.06 MIL/uL (ref 3.87–5.11)
RDW: 13.9 % (ref 11.5–15.5)
WBC: 8 10*3/uL (ref 4.0–10.5)

## 2015-04-01 LAB — URINALYSIS, ROUTINE W REFLEX MICROSCOPIC
Glucose, UA: NEGATIVE mg/dL
HGB URINE DIPSTICK: NEGATIVE
KETONES UR: 15 mg/dL — AB
Nitrite: POSITIVE — AB
PH: 5.5 (ref 5.0–8.0)
Protein, ur: 30 mg/dL — AB
Specific Gravity, Urine: >1.03 — ABNORMAL HIGH (ref 1.005–1.030)
Urobilinogen, UA: 2 mg/dL — ABNORMAL HIGH (ref 0.0–1.0)

## 2015-04-01 LAB — COMPREHENSIVE METABOLIC PANEL
ALK PHOS: 76 U/L (ref 39–117)
ALT: 21 U/L (ref 0–35)
AST: 30 U/L (ref 0–37)
Albumin: 3.7 g/dL (ref 3.5–5.2)
Anion gap: 11 (ref 5–15)
BILIRUBIN TOTAL: 1 mg/dL (ref 0.3–1.2)
BUN: 9 mg/dL (ref 6–23)
CHLORIDE: 103 mmol/L (ref 96–112)
CO2: 25 mmol/L (ref 19–32)
Calcium: 9.2 mg/dL (ref 8.4–10.5)
Creatinine, Ser: 0.72 mg/dL (ref 0.50–1.10)
GFR calc Af Amer: >90 mL/min (ref >90)
GFR calc non Af Amer: >90 mL/min (ref >90)
GLUCOSE: 111 mg/dL — AB (ref 70–99)
Potassium: 3.2 mmol/L — ABNORMAL LOW (ref 3.5–5.1)
Sodium: 139 mmol/L (ref 135–145)
Total Protein: 7.8 g/dL (ref 6.0–8.3)

## 2015-04-01 LAB — EXPECTORATED SPUTUM ASSESSMENT W GRAM STAIN, RFLX TO RESP C

## 2015-04-01 LAB — URINE MICROSCOPIC-ADD ON

## 2015-04-01 LAB — EXPECTORATED SPUTUM ASSESSMENT W REFEX TO RESP CULTURE: Special Requests: NORMAL

## 2015-04-01 LAB — I-STAT CG4 LACTIC ACID, ED: Lactic Acid, Venous: 1.19 mmol/L (ref 0.5–2.0)

## 2015-04-01 MED ORDER — IPRATROPIUM BROMIDE 0.02 % IN SOLN
0.5000 mg | Freq: Four times a day (QID) | RESPIRATORY_TRACT | Status: DC
  Administered 2015-04-02 – 2015-04-03 (×5): 0.5 mg via RESPIRATORY_TRACT
  Filled 2015-04-01 (×5): qty 2.5

## 2015-04-01 MED ORDER — ALBUTEROL SULFATE (2.5 MG/3ML) 0.083% IN NEBU: 2.5000 mg | INHALATION_SOLUTION | RESPIRATORY_TRACT | Status: DC | PRN

## 2015-04-01 MED ORDER — DEXTROSE 5 % IV SOLN
500.0000 mg | INTRAVENOUS | Status: DC
  Administered 2015-04-01 – 2015-04-03 (×3): 500 mg via INTRAVENOUS
  Filled 2015-04-01 (×3): qty 500

## 2015-04-01 MED ORDER — MORPHINE SULFATE ER 30 MG PO TBCR
60.0000 mg | EXTENDED_RELEASE_TABLET | Freq: Two times a day (BID) | ORAL | Status: DC
  Administered 2015-04-02 – 2015-04-04 (×5): 60 mg via ORAL
  Filled 2015-04-01 (×7): qty 2

## 2015-04-01 MED ORDER — ENOXAPARIN SODIUM 40 MG/0.4ML ~~LOC~~ SOLN
40.0000 mg | SUBCUTANEOUS | Status: DC
  Administered 2015-04-01 – 2015-04-03 (×2): 40 mg via SUBCUTANEOUS
  Filled 2015-04-01 (×3): qty 0.4

## 2015-04-01 MED ORDER — OXYCODONE HCL 5 MG PO TABS
15.0000 mg | ORAL_TABLET | Freq: Four times a day (QID) | ORAL | Status: DC | PRN
  Administered 2015-04-01 – 2015-04-04 (×8): 15 mg via ORAL
  Filled 2015-04-01 (×8): qty 3

## 2015-04-01 MED ORDER — OXYCODONE HCL 5 MG PO TABS
10.0000 mg | ORAL_TABLET | Freq: Once | ORAL | Status: AC
  Administered 2015-04-01: 10 mg via ORAL
  Filled 2015-04-01: qty 2

## 2015-04-01 MED ORDER — SODIUM CHLORIDE 0.9 % IV SOLN
INTRAVENOUS | Status: DC
  Administered 2015-04-01 – 2015-04-03 (×5): via INTRAVENOUS

## 2015-04-01 MED ORDER — CLONAZEPAM 0.5 MG PO TABS
0.5000 mg | ORAL_TABLET | Freq: Two times a day (BID) | ORAL | Status: DC | PRN
  Administered 2015-04-01 – 2015-04-03 (×3): 0.5 mg via ORAL
  Filled 2015-04-01 (×3): qty 1

## 2015-04-01 MED ORDER — DEXTROSE 5 % IV SOLN
1.0000 g | INTRAVENOUS | Status: DC
  Administered 2015-04-01 – 2015-04-03 (×3): 1 g via INTRAVENOUS
  Filled 2015-04-01 (×4): qty 10

## 2015-04-01 MED ORDER — OXYCODONE HCL 5 MG PO TABS
5.0000 mg | ORAL_TABLET | Freq: Once | ORAL | Status: AC
  Administered 2015-04-01: 5 mg via ORAL
  Filled 2015-04-01: qty 1

## 2015-04-01 MED ORDER — SODIUM CHLORIDE 0.9 % IV BOLUS (SEPSIS)
1000.0000 mL | Freq: Once | INTRAVENOUS | Status: AC
  Administered 2015-04-01: 1000 mL via INTRAVENOUS

## 2015-04-01 MED ORDER — ACETAMINOPHEN 325 MG PO TABS
650.0000 mg | ORAL_TABLET | Freq: Once | ORAL | Status: DC
  Filled 2015-04-01 (×2): qty 2

## 2015-04-01 MED ORDER — OSELTAMIVIR PHOSPHATE 75 MG PO CAPS
75.0000 mg | ORAL_CAPSULE | Freq: Two times a day (BID) | ORAL | Status: DC
  Administered 2015-04-01 – 2015-04-02 (×2): 75 mg via ORAL
  Filled 2015-04-01 (×2): qty 1

## 2015-04-01 MED ORDER — BUDESONIDE-FORMOTEROL FUMARATE 80-4.5 MCG/ACT IN AERO
2.0000 | INHALATION_SPRAY | Freq: Two times a day (BID) | RESPIRATORY_TRACT | Status: DC
  Administered 2015-04-02 – 2015-04-04 (×5): 2 via RESPIRATORY_TRACT
  Filled 2015-04-01: qty 6.9

## 2015-04-01 MED ORDER — POTASSIUM CHLORIDE CRYS ER 20 MEQ PO TBCR
40.0000 meq | EXTENDED_RELEASE_TABLET | Freq: Once | ORAL | Status: AC
  Administered 2015-04-01: 40 meq via ORAL
  Filled 2015-04-01: qty 2

## 2015-04-01 NOTE — ED Provider Notes (Signed)
CSN: [id number]     Arrival date & time 04/01/15  1538 History   First MD Initiated Contact with Patient 04/01/15 1612     Chief Complaint  Patient presents with  . Cough  . Generalized Body Aches     (Consider location/radiation/quality/duration/timing/severity/associated sxs/prior Treatment) Patient is a 49 y.o. female presenting with cough. The history is provided by the patient.  Cough Cough characteristics:  Productive Sputum characteristics:  Yellow Severity:  Severe Onset quality:  Gradual Duration:  2 weeks Timing:  Constant Progression:  Worsening Chronicity:  Recurrent Smoker: yes   Relieved by:  Nothing Worsened by:  Smoking Ineffective treatments:  Beta-agonist inhaler Associated symptoms: chills, fever, shortness of breath and wheezing   Associated symptoms: no rash     Past Medical History  Diagnosis Date  . Endometriosis   . PTSD (post-traumatic stress disorder)   . Neuropathic pain   . Chronic back pain   . Fibromyalgia   . COPD, mild   . Anxiety    Past Surgical History  Procedure Laterality Date  . Tubal ligation    . Copolscopy    . Tonsillectomy    . Cesarean section     No family history on file. History  Substance Use Topics  . Smoking status: Current Every Day Smoker -- 1.00 packs/day    Types: Cigarettes  . Smokeless tobacco: Not on file  . Alcohol Use: Yes     Comment: denies   OB History    No data available     Review of Systems  Constitutional: Positive for fever and chills.  Respiratory: Positive for cough, shortness of breath and wheezing.   Skin: Negative for rash.  All other systems reviewed and are negative.     Allergies  Guaifenesin & derivatives; Ampicillin; and Clindamycin/lincomycin  Home Medications   Prior to Admission medications   Medication Sig Start Date End Date Taking? Authorizing Provider  albuterol (PROVENTIL HFA;VENTOLIN HFA) 108 (90 BASE) MCG/ACT inhaler Inhale 2 puffs into the lungs every 6  (six) hours as needed. Shortness of breath    Historical Provider, MD  baclofen (LIORESAL) 10 MG tablet Take 1 tablet (10 mg total) by mouth 3 (three) times daily. 11/16/13   Earley Favor, NP  fluticasone (FLONASE) 50 MCG/ACT nasal spray Place 2 sprays into the nose daily.    Historical Provider, MD  medroxyPROGESTERone (DEPO-PROVERA) 150 MG/ML injection Inject 150 mg into the muscle every 3 (three) months.    Historical Provider, MD  morphine (MS CONTIN) 60 MG 12 hr tablet Take 60 mg by mouth every 12 (twelve) hours.     Historical Provider, MD  oxyCODONE (OXY IR/ROXICODONE) 5 MG immediate release tablet Take 15 mg by mouth 3 (three) times daily. Pain    Historical Provider, MD  oxyCODONE-acetaminophen (PERCOCET/ROXICET) 5-325 MG per tablet Take 1 tablet by mouth every 4 (four) hours as needed for pain. 07/17/13   Elpidio Anis, PA-C  pregabalin (LYRICA) 50 MG capsule Take 50 mg by mouth every morning.     Historical Provider, MD   BP 127/45 mmHg  Pulse 129  Temp(Src) 98.2 F (36.8 C) (Oral)  Resp 22  SpO2 89% Physical Exam  Constitutional: She is oriented to person, place, and time. She appears well-developed and well-nourished.  Non-toxic appearance. No distress.  HENT:  Head: Normocephalic and atraumatic.  Eyes: Conjunctivae, EOM and lids are normal. Pupils are equal, round, and reactive to light.  Neck: Normal range of motion. Neck supple. No tracheal  deviation present. No thyroid mass present.  Cardiovascular: Regular rhythm and normal heart sounds.  Tachycardia present.  Exam reveals no gallop.   No murmur heard. Pulmonary/Chest: Effort normal. No stridor. No respiratory distress. She has decreased breath sounds. She has wheezes. She has no rhonchi. She has no rales.  Abdominal: Soft. Normal appearance and bowel sounds are normal. She exhibits no distension. There is no tenderness. There is no rebound and no CVA tenderness.  Musculoskeletal: Normal range of motion. She exhibits no edema  or tenderness.  Neurological: She is alert and oriented to person, place, and time. She has normal strength. No cranial nerve deficit or sensory deficit. GCS eye subscore is 4. GCS verbal subscore is 5. GCS motor subscore is 6.  Skin: Skin is warm and dry. No abrasion and no rash noted.  Psychiatric: She has a normal mood and affect. Her speech is normal and behavior is normal.  Nursing note and vitals reviewed.   ED Course  Procedures (including critical care time) Labs Review Labs Reviewed  CULTURE, BLOOD (ROUTINE X 2)  CULTURE, BLOOD (ROUTINE X 2)  URINE CULTURE  CBC WITH DIFFERENTIAL/PLATELET  COMPREHENSIVE METABOLIC PANEL  URINALYSIS, ROUTINE W REFLEX MICROSCOPIC  I-STAT CG4 LACTIC ACID, ED    Imaging Review No results found.   EKG Interpretation None      MDM   Final diagnoses:  SOB (shortness of breath)    Patient started on IV antibiotics for 3 acquired pneumonia on the right lower lobe. Will be admitted to the medicine service    Lorre NickAnthony Tayona Sarnowski, MD 04/01/15 1840

## 2015-04-01 NOTE — H&P (Signed)
PCP:  [facility]   Referring provider Joni Reining Pisciotta  Chief Complaint:  cough  HPI: Michele Lucas is a 49 y.o. female   has a past medical history of Endometriosis; PTSD (post-traumatic stress disorder); Neuropathic pain; Chronic back pain; Fibromyalgia; COPD, mild; and Anxiety.   Presented with  2 weeks of cough, She states 1.5 weeks ago she was taken care of her grandson with the flu. She started to have body aches. She has chronic back pain but had to lift her Grandson she thought her pain was due to caring him. Few days later she started to have cough that was much worse. She presented to ER due to coughing and headache. Reports no fever, she has productive cough. Patient reports popping her Lyrica because it wasn't helping her. She is normally followed by [facility] for history of PTSD and fibromyalgia.  Hospitalist was called for admission for  CAP possible influenza  Review of Systems:    Pertinent positives include: fatigue, excess mucus,  productive cough, body aches  Constitutional:  No weight loss, night sweats, Fevers, chills,  weight loss  HEENT:  No headaches, Difficulty swallowing,Tooth/dental problems,Sore throat,  No sneezing, itching, ear ache, nasal congestion, post nasal drip,  Cardio-vascular:  No chest pain, Orthopnea, PND, anasarca, dizziness, palpitations.no Bilateral lower extremity swelling  GI:  No heartburn, indigestion, abdominal pain, nausea, vomiting, diarrhea, change in bowel habits, loss of appetite, melena, blood in stool, hematemesis Resp:  no shortness of breath at rest. No dyspnea on exertion, No No non-productive cough, No coughing up of blood.No change in color of mucus.No wheezing. Skin:  no rash or lesions. No jaundice GU:  no dysuria, change in color of urine, no urgency or frequency. No straining to urinate.  No flank pain.  Musculoskeletal:  No joint pain or no joint swelling. No decreased range of motion. No back pain.  Psych:    No change in mood or affect. No depression or anxiety. No memory loss.  Neuro: no localizing neurological complaints, no tingling, no weakness, no double vision, no gait abnormality, no slurred speech, no confusion  Otherwise ROS are negative except for above, 10 systems were reviewed  Past Medical History: Past Medical History  Diagnosis Date  . Endometriosis   . PTSD (post-traumatic stress disorder)   . Neuropathic pain   . Chronic back pain   . Fibromyalgia   . COPD, mild   . Anxiety    Past Surgical History  Procedure Laterality Date  . Tubal ligation    . Copolscopy    . Tonsillectomy    . Cesarean section       Medications: Prior to Admission medications   Medication Sig Start Date End Date Taking? Authorizing Provider  budesonide-formoterol (SYMBICORT) 80-4.5 MCG/ACT inhaler Inhale 2 puffs into the lungs 2 (two) times daily.   Yes Historical Provider, MD  clonazePAM (KLONOPIN) 0.5 MG tablet Take 0.5 mg by mouth 2 (two) times daily as needed for anxiety (anxiety).   Yes Historical Provider, MD  morphine (MS CONTIN) 60 MG 12 hr tablet Take 60 mg by mouth every 12 (twelve) hours.    Yes Historical Provider, MD  oxyCODONE (OXY IR/ROXICODONE) 5 MG immediate release tablet Take 15 mg by mouth 3 (three) times daily. Pain   Yes Historical Provider, MD  pregabalin (LYRICA) 50 MG capsule Take 50 mg by mouth every morning.    Yes Historical Provider, MD  baclofen (LIORESAL) 10 MG tablet Take 1 tablet (10 mg total)  by mouth 3 (three) times daily. Patient not taking: Reported on 04/01/2015 11/16/13   Earley FavorGail Schulz, NP  fluticasone [facility](FLONASE) 50 MCG/ACT nasal spray Place 2 sprays into the nose daily.    Historical Provider, MD  medroxyPROGESTERone (DEPO-PROVERA) 150 MG/ML injection Inject 150 mg into the muscle every 3 (three) months.    Historical Provider, MD  oxyCODONE-acetaminophen (PERCOCET/ROXICET) 5-325 MG per tablet Take 1 tablet by mouth every 4 (four) hours as needed for  pain. Patient not taking: Reported on 04/01/2015 07/17/13   Elpidio AnisShari Upstill, PA-C    Allergies:   Allergies  Allergen Reactions  . Guaifenesin & Derivatives Shortness Of Breath    "Mucinex brand"  . Ampicillin Hives and Rash  . Clindamycin/Lincomycin Swelling and Rash    Have these reactions in feet, ankles and legs.    Social History:  Ambulatory  independently   Lives at home alone,      reports that she has been smoking Cigarettes.  She has been smoking about 1.00 pack per day. She does not have any smokeless tobacco history on file. She reports that she does not drink alcohol or use illicit drugs.    Family History: family history is negative for Diabetes type II and Stroke.    Physical Exam: Patient Vitals for the past 24 hrs:  BP Temp Temp src Pulse Resp SpO2  04/01/15 1902 137/63 mmHg - - 93 17 95 %  04/01/15 1657 125/69 mmHg - - 100 17 95 %  04/01/15 1604 (!) 127/45 mmHg 98.2 F (36.8 C) Oral (!) 129 22 (!) 89 %    1. General:  in No Acute distress 2. Psychological: Alert and  Oriented 3. Head/ENT:     Dry Mucous Membranes                          Head Non traumatic, neck supple                          Normal   Dentition 4. SKIN:  decreased Skin turgor,  Skin clean Dry and intact no rash 5. Heart: Regular rate and rhythm no Murmur, Rub or gallop 6. Lungs:  no wheezes extensive crackles   right base 7. Abdomen: Soft, non-tender, Non distended 8. Lower extremities: no clubbing, cyanosis, or edema 9. Neurologically Grossly intact, moving all 4 extremities equally 10. MSK: Normal range of motion  body mass index is unknown because there is no weight on file.   Labs on Admission:   Results for orders placed or performed during the hospital encounter of 04/01/15 (from the past 24 hour(s))  CBC with Differential/Platelet     Status: Abnormal   Collection Time: 04/01/15  4:35 PM  Result Value Ref Range   WBC 8.0 4.0 - 10.5 K/uL   RBC 4.06 3.87 - 5.11 MIL/uL    Hemoglobin 12.2 12.0 - 15.0 g/dL   HCT 96.038.4 45.436.0 - 09.846.0 %   MCV 94.6 78.0 - 100.0 fL   MCH 30.0 26.0 - 34.0 pg   MCHC 31.8 30.0 - 36.0 g/dL   RDW 11.913.9 14.711.5 - 82.915.5 %   Platelets 249 150 - 400 K/uL   Neutrophils Relative % 62 43 - 77 %   Neutro Abs 5.0 1.7 - 7.7 K/uL   Lymphocytes Relative 20 12 - 46 %   Lymphs Abs 1.6 0.7 - 4.0 K/uL   Monocytes Relative 17 (H) 3 - 12 %  Monocytes Absolute 1.3 (H) 0.1 - 1.0 K/uL   Eosinophils Relative 1 0 - 5 %   Eosinophils Absolute 0.1 0.0 - 0.7 K/uL   Basophils Relative 0 0 - 1 %   Basophils Absolute 0.0 0.0 - 0.1 K/uL  Comprehensive metabolic panel     Status: Abnormal   Collection Time: 04/01/15  4:35 PM  Result Value Ref Range   Sodium 139 135 - 145 mmol/L   Potassium 3.2 (L) 3.5 - 5.1 mmol/L   Chloride 103 96 - 112 mmol/L   CO2 25 19 - 32 mmol/L   Glucose, Bld 111 (H) 70 - 99 mg/dL   BUN 9 6 - 23 mg/dL   Creatinine, Ser 7.85 0.50 - 1.10 mg/dL   Calcium 9.2 8.4 - 88.5 mg/dL   Total Protein 7.8 6.0 - 8.3 g/dL   Albumin 3.7 3.5 - 5.2 g/dL   AST 30 0 - 37 U/L   ALT 21 0 - 35 U/L   Alkaline Phosphatase 76 39 - 117 U/L   Total Bilirubin 1.0 0.3 - 1.2 mg/dL   GFR calc non Af Amer >90 >90 mL/min   GFR calc Af Amer >90 >90 mL/min   Anion gap 11 5 - 15  Urinalysis, Routine w reflex microscopic     Status: Abnormal   Collection Time: 04/01/15  4:51 PM  Result Value Ref Range   Color, Urine AMBER (A) YELLOW   APPearance CLOUDY (A) CLEAR   Specific Gravity, Urine >1.030 (H) 1.005 - 1.030   pH 5.5 5.0 - 8.0   Glucose, UA NEGATIVE NEGATIVE mg/dL   Hgb urine dipstick NEGATIVE NEGATIVE   Bilirubin Urine LARGE (A) NEGATIVE   Ketones, ur 15 (A) NEGATIVE mg/dL   Protein, ur 30 (A) NEGATIVE mg/dL   Urobilinogen, UA 2.0 (H) 0.0 - 1.0 mg/dL   Nitrite POSITIVE (A) NEGATIVE   Leukocytes, UA SMALL (A) NEGATIVE  Urine microscopic-add on     Status: None   Collection Time: 04/01/15  4:51 PM  Result Value Ref Range   Squamous Epithelial / LPF RARE  RARE   WBC, UA 0-2 <3 WBC/hpf   Bacteria, UA RARE RARE   Urine-Other MUCOUS PRESENT   I-Stat CG4 Lactic Acid, ED     Status: None   Collection Time: 04/01/15  5:03 PM  Result Value Ref Range   Lactic Acid, Venous 1.19 0.5 - 2.0 mmol/L    UA: nitrite positive WBC 0-2 rare bacteria  No results found for: HGBA1C  CrCl cannot be calculated (Unknown ideal weight.).  BNP (last 3 results) No results for input(s): PROBNP in the last 8760 hours.  Other results:  I have pearsonaly reviewed this: ECG REPORT not obtained   There were no vitals filed for this visit.   Cultures:    Component Value Date/Time   SDES URINE, CLEAN CATCH 04/16/2011 1146   SPECREQUEST NONE 04/16/2011 1146   CULT  04/16/2011 1146    Multiple bacterial morphotypes present, none predominant. Suggest appropriate recollection if clinically indicated.   REPTSTATUS 04/17/2011 FINAL 04/16/2011 1146     Radiological Exams on Admission: Dg Chest 2 View  04/01/2015   CLINICAL DATA:  Cough.  Congestion with fever.  EXAM: CHEST  2 VIEW  COMPARISON:  10/07/2011.  FINDINGS: Patchy RIGHT lower lobe opacity consistent with pneumonia. This was not present previously. Otherwise clear lung fields. Mild chronic CP angle blunting. Normal cardiomediastinal silhouette. No osseous findings.  IMPRESSION: RIGHT lower lobe pneumonia.   Electronically  Signed   By: Davonna Belling M.D.   On: 04/01/2015 18:16    Chart has been reviewed  Family not at  Bedside she has a neighbor here      Assessment/Plan  49 year old female with history of PTSD possible COPD presents cough and myalgias with chest x-ray significant for pneumonia with history of exposure to the person with influenza  Present on Admission:  . CAP (community acquired pneumonia) -  - will admit for treatment of CAP will start on appropriate antibiotic coverage.   Obtain sputum cultures, blood cultures if febrile or if decompensates.  Provide oxygen as needed. Given history  of exposure to influenza well start Tamiflu and check for flu  . COPD (chronic obstructive pulmonary disease) - currently appears to be stable will provide albuterol as needed and scheduled Atrovent audible wheezes at this point UA positive for nitrites will await results of urine culture   Prophylaxis:   Lovenox   CODE STATUS:  FULL CODE  as per patient    Disposition:                           To home once workup is complete and patient is stable  Other plan as per orders.  I have spent a total of 55 min on this admission  Elchanan Bob 04/01/2015, 7:40 PM  [facility]  Pager [phone]   after 2 AM please page floor coverage PA If 7AM-7PM, please contact the day team taking care of the patient  [email or web address]  Password [email or web address]

## 2015-04-01 NOTE — ED Notes (Signed)
Pt c/o cough, body aches, and "dehydration" x 2 wks.  Pt states that her urine is dark orange.  Wretching in triage.

## 2015-04-02 ENCOUNTER — Encounter (HOSPITAL_COMMUNITY): Payer: Self-pay

## 2015-04-02 DIAGNOSIS — F411 Generalized anxiety disorder: Secondary | ICD-10-CM

## 2015-04-02 DIAGNOSIS — J42 Unspecified chronic bronchitis: Secondary | ICD-10-CM

## 2015-04-02 DIAGNOSIS — R8290 Unspecified abnormal findings in urine: Secondary | ICD-10-CM | POA: Diagnosis present

## 2015-04-02 DIAGNOSIS — R0602 Shortness of breath: Secondary | ICD-10-CM | POA: Insufficient documentation

## 2015-04-02 DIAGNOSIS — J189 Pneumonia, unspecified organism: Principal | ICD-10-CM

## 2015-04-02 DIAGNOSIS — E876 Hypokalemia: Secondary | ICD-10-CM | POA: Diagnosis present

## 2015-04-02 DIAGNOSIS — G894 Chronic pain syndrome: Secondary | ICD-10-CM | POA: Diagnosis present

## 2015-04-02 DIAGNOSIS — D649 Anemia, unspecified: Secondary | ICD-10-CM | POA: Diagnosis present

## 2015-04-02 LAB — CBC WITH DIFFERENTIAL/PLATELET
Basophils Absolute: 0 10*3/uL (ref 0.0–0.1)
Basophils Relative: 0 % (ref 0–1)
EOS PCT: 2 % (ref 0–5)
Eosinophils Absolute: 0.1 10*3/uL (ref 0.0–0.7)
HEMATOCRIT: 34.1 % — AB (ref 36.0–46.0)
Hemoglobin: 10.8 g/dL — ABNORMAL LOW (ref 12.0–15.0)
LYMPHS ABS: 1.9 10*3/uL (ref 0.7–4.0)
Lymphocytes Relative: 33 % (ref 12–46)
MCH: 30.3 pg (ref 26.0–34.0)
MCHC: 31.7 g/dL (ref 30.0–36.0)
MCV: 95.5 fL (ref 78.0–100.0)
MONOS PCT: 10 % (ref 3–12)
Monocytes Absolute: 0.6 10*3/uL (ref 0.1–1.0)
NEUTROS ABS: 3.1 10*3/uL (ref 1.7–7.7)
NEUTROS PCT: 55 % (ref 43–77)
Platelets: 242 10*3/uL (ref 150–400)
RBC: 3.57 MIL/uL — AB (ref 3.87–5.11)
RDW: 14 % (ref 11.5–15.5)
WBC: 5.8 10*3/uL (ref 4.0–10.5)

## 2015-04-02 LAB — COMPREHENSIVE METABOLIC PANEL
ALBUMIN: 3 g/dL — AB (ref 3.5–5.2)
ALT: 15 U/L (ref 0–35)
ANION GAP: 5 (ref 5–15)
AST: 17 U/L (ref 0–37)
Alkaline Phosphatase: 55 U/L (ref 39–117)
BUN: 8 mg/dL (ref 6–23)
CALCIUM: 8.4 mg/dL (ref 8.4–10.5)
CO2: 27 mmol/L (ref 19–32)
CREATININE: 0.62 mg/dL (ref 0.50–1.10)
Chloride: 108 mmol/L (ref 96–112)
GFR calc Af Amer: >90 mL/min (ref >90)
GFR calc non Af Amer: >90 mL/min (ref >90)
GLUCOSE: 91 mg/dL (ref 70–99)
Potassium: 3.1 mmol/L — ABNORMAL LOW (ref 3.5–5.1)
Sodium: 140 mmol/L (ref 135–145)
Total Bilirubin: 0.8 mg/dL (ref 0.3–1.2)
Total Protein: 6.4 g/dL (ref 6.0–8.3)

## 2015-04-02 LAB — INFLUENZA PANEL BY PCR (TYPE A & B)
H1N1 flu by pcr: NOT DETECTED
Influenza A By PCR: NEGATIVE
Influenza B By PCR: NEGATIVE

## 2015-04-02 LAB — HEPATITIS PANEL, ACUTE
HCV Ab: NEGATIVE
Hep A IgM: NONREACTIVE
Hep B C IgM: NONREACTIVE
Hepatitis B Surface Ag: NEGATIVE

## 2015-04-02 LAB — HIV ANTIBODY (ROUTINE TESTING W REFLEX): HIV Screen 4th Generation wRfx: NONREACTIVE

## 2015-04-02 LAB — EXPECTORATED SPUTUM ASSESSMENT W REFEX TO RESP CULTURE

## 2015-04-02 LAB — EXPECTORATED SPUTUM ASSESSMENT W GRAM STAIN, RFLX TO RESP C

## 2015-04-02 LAB — GAMMA GT: GGT: 47 U/L (ref 7–51)

## 2015-04-02 LAB — TSH: TSH: 0.318 u[IU]/mL — ABNORMAL LOW (ref 0.350–4.500)

## 2015-04-02 LAB — MAGNESIUM: Magnesium: 1.8 mg/dL (ref 1.5–2.5)

## 2015-04-02 LAB — PHOSPHORUS: PHOSPHORUS: 2.5 mg/dL (ref 2.3–4.6)

## 2015-04-02 LAB — STREP PNEUMONIAE URINARY ANTIGEN: Strep Pneumo Urinary Antigen: NEGATIVE

## 2015-04-02 MED ORDER — ACETAMINOPHEN 325 MG PO TABS
650.0000 mg | ORAL_TABLET | ORAL | Status: DC | PRN
  Administered 2015-04-02: 325 mg via ORAL
  Administered 2015-04-03: 650 mg via ORAL
  Filled 2015-04-02 (×2): qty 2

## 2015-04-02 MED ORDER — POTASSIUM CHLORIDE 10 MEQ/100ML IV SOLN
10.0000 meq | INTRAVENOUS | Status: AC
  Administered 2015-04-02 (×4): 10 meq via INTRAVENOUS
  Filled 2015-04-02 (×4): qty 100

## 2015-04-02 MED ORDER — ENSURE ENLIVE PO LIQD
237.0000 mL | Freq: Two times a day (BID) | ORAL | Status: DC
  Administered 2015-04-02 – 2015-04-03 (×2): 237 mL via ORAL

## 2015-04-02 MED ORDER — POTASSIUM CHLORIDE CRYS ER 20 MEQ PO TBCR
40.0000 meq | EXTENDED_RELEASE_TABLET | Freq: Once | ORAL | Status: AC
  Administered 2015-04-02: 40 meq via ORAL
  Filled 2015-04-02: qty 4

## 2015-04-02 NOTE — Progress Notes (Signed)
Progress Note   Michele Lucas DOB: [birth year] DOA: 04/01/2015 PCP: [facility]   Brief Narrative:   Michele Lucas is an 49 y.o. female with a PMH of COPD managed with Symbicort who was admitted 04/01/15 chief complaint of cough, myalgias and headache. On initial evaluation, vital signs showed BP 127/45, pulse 129, respirations 22 and O2 sats ration of 89%. Chest x-ray showed a right lower lobe pneumonia.  Assessment/Plan:   Principal Problem:   CAP (community acquired pneumonia) - Continue empiric Rocephin/azithromycin, d/c Tamiflu. - Follow-up sputum cultures, blood cultures, influenza panel, HIV PCR, strep/Legionella antigens.  Active Problems:   Urolbilinogen level high in urine - No elevation of LFTs or evidence of biliary obstruction. - May be from chronic hepatitis or cirrhosis.  Husband is being treated for hepatitis C. - Check acute hepatitis panel and GGT.    Normocytic anemia - 2 g drop in hemoglobin likely secondary to dilutional factors. Monitor.    Hypokalemia - Give 4 runs of IV potassium.  Unable to swallow oral tablets.    Chronic pain syndrome / opiate dependency - Patient is on chronic MS Contin 60 mg every 12 hours and oxycodone for breakthrough pain. - Lyrica currently on hold.    Anxiety - Continue Klonopin.    COPD (chronic obstructive pulmonary disease) - Continue bronchodilators every 6 hours. Continue Symbicort.    DVT Prophylaxis - Continue Lovenox.  Code Status: Full. Family Communication: Husband, Michele Lucas, is emergency contact.  Updated by telephone.  He can be reached at [phone]. Disposition Plan: Home when respiratory status stable, cultures/work up completed.   IV Access:    Peripheral IV   Procedures and diagnostic studies:   Dg Chest 2 View 04/01/2015: RIGHT lower lobe pneumonia.     Medical Consultants:    None.  Anti-Infectives:    Rocephin 04/01/15--->  Azithromycin 04/01/15--->  Tamiflu  04/01/15--->04/02/15  Subjective:   Michele Lucas says she is less dyspneic, still with a cough.  Says her urine is dark in color.  Says she has had recent thrush in her mouth.   Objective:    Filed Vitals:   04/01/15 2028 04/02/15 0237 04/02/15 0605 04/02/15 0747  BP: 127/73  128/56   Pulse: 106  90   Temp: 98.9 F (37.2 C)  99 F (37.2 C)   TempSrc: Oral  Oral   Resp: 18  18   Height:  (1.575 m)     Weight: 45.7 kg (100 lb 12 oz)     SpO2: 94% 88% 92% 92%    Intake/Output Summary (Last 24 hours) at 04/02/15 0751 Last data filed at 04/02/15 0740  Gross per 24 hour  Intake      0 ml  Output    125 ml  Net   -125 ml    Exam: Gen:  NAD, poor dentition, thin Cardiovascular:  Tachy, No M/R/G Respiratory:  Lungs with scattered rhonchi Gastrointestinal:  Abdomen soft, NT/ND, + BS Extremities:  No C/E/C   Data Reviewed:    Labs: Basic Metabolic Panel:  Recent Labs Lab 04/01/15 1635 04/02/15 0454  NA 139 140  K 3.2* 3.1*  CL 103 108  CO2 25 27  GLUCOSE 111* 91  BUN 9 8  CREATININE 0.72 0.62  CALCIUM 9.2 8.4   GFR Estimated Creatinine Clearance: 60.7 mL/min (by C-G formula based on Cr of 0.62). Liver Function Tests:  Recent Labs Lab 04/01/15 1635 04/02/15 0454  AST 30 17  ALT 21  15  ALKPHOS 76 55  BILITOT 1.0 0.8  PROT 7.8 6.4  ALBUMIN 3.7 3.0*   CBC:  Recent Labs Lab 04/01/15 1635 04/02/15 0454  WBC 8.0 5.8  NEUTROABS 5.0 3.1  HGB 12.2 10.8*  HCT 38.4 34.1*  MCV 94.6 95.5  PLT 249 242   Hgb A1c: No results for input(s): HGBA1C in the last 72 hours. Lipid Profile: No results for input(s): CHOL, HDL, LDLCALC, TRIG, CHOLHDL, LDLDIRECT in the last 72 hours. Thyroid function studies: No results for input(s): TSH, T4TOTAL, T3FREE, THYROIDAB in the last 72 hours.  Invalid input(s): FREET3  Sepsis Labs:  Recent Labs Lab 04/01/15 1635 04/01/15 1703 04/02/15 0454  WBC 8.0  --  5.8  LATICACIDVEN  --  1.19  --     Microbiology Recent Results (from the past 240 hour(s))  Culture, sputum-assessment     Status: None   Collection Time: 04/01/15  9:29 PM  Result Value Ref Range Status   Specimen Description SPU  Final   Special Requests Normal  Final   Sputum evaluation   Final    MICROSCOPIC FINDINGS SUGGEST THAT THIS SPECIMEN IS NOT REPRESENTATIVE OF LOWER RESPIRATORY SECRETIONS. PLEASE RECOLLECT. NOTIFIED Oretha EllisSOPHIA PICKETT,RN [phone] @ 1034 BY J SCOTTON    Report Status 04/01/2015 FINAL  Final     Medications:   . acetaminophen  650 mg Oral Once  . azithromycin  500 mg Intravenous Q24H  . budesonide-formoterol  2 puff Inhalation BID  . cefTRIAXone (ROCEPHIN)  IV  1 g Intravenous Q24H  . enoxaparin (LOVENOX) injection  40 mg Subcutaneous Q24H  . feeding supplement (ENSURE ENLIVE)  237 mL Oral BID BM  . ipratropium  0.5 mg Nebulization Q6H  . morphine  60 mg Oral Q12H  . oseltamivir  75 mg Oral BID   Continuous Infusions: . sodium chloride 125 mL/hr at 04/02/15 0543    Time spent: 35 minutes with > 50% of time discussing current diagnostic test results, clinical impression and plan of care.    LOS: 1 day   RAMA,CHRISTINA  [facility] Pager [phone]. If unable to reach me by pager, please call my cell phone at [phone].  *Please refer to [email or web address], password [email or web address] to get updated schedule on who will round on this patient, as hospitalists switch teams weekly. If 7PM-7AM, please contact night-coverage at [email or web address], password [email or web address] for any overnight needs.  04/02/2015, 7:51 AM

## 2015-04-02 NOTE — Progress Notes (Signed)
Utilization review completed.  

## 2015-04-02 NOTE — Progress Notes (Signed)
Pt with low grade temp of 100.2. MD made aware and request made for tylenol. Order given. Vwilliams,rn.

## 2015-04-03 DIAGNOSIS — R8290 Unspecified abnormal findings in urine: Secondary | ICD-10-CM

## 2015-04-03 LAB — BASIC METABOLIC PANEL
ANION GAP: 4 — AB (ref 5–15)
BUN: 9 mg/dL (ref 6–23)
CO2: 23 mmol/L (ref 19–32)
CREATININE: 0.66 mg/dL (ref 0.50–1.10)
Calcium: 8.2 mg/dL — ABNORMAL LOW (ref 8.4–10.5)
Chloride: 113 mmol/L — ABNORMAL HIGH (ref 96–112)
GFR calc Af Amer: >90 mL/min (ref >90)
GFR calc non Af Amer: >90 mL/min (ref >90)
Glucose, Bld: 83 mg/dL (ref 70–99)
Potassium: 3.9 mmol/L (ref 3.5–5.1)
SODIUM: 140 mmol/L (ref 135–145)

## 2015-04-03 LAB — EXPECTORATED SPUTUM ASSESSMENT W GRAM STAIN, RFLX TO RESP C

## 2015-04-03 LAB — URINALYSIS, ROUTINE W REFLEX MICROSCOPIC
BILIRUBIN URINE: NEGATIVE
Glucose, UA: NEGATIVE mg/dL
Hgb urine dipstick: NEGATIVE
KETONES UR: NEGATIVE mg/dL
Leukocytes, UA: NEGATIVE
Nitrite: NEGATIVE
PH: 6 (ref 5.0–8.0)
Protein, ur: NEGATIVE mg/dL
SPECIFIC GRAVITY, URINE: 1.025 (ref 1.005–1.030)
Urobilinogen, UA: 1 mg/dL (ref 0.0–1.0)

## 2015-04-03 LAB — EXPECTORATED SPUTUM ASSESSMENT W REFEX TO RESP CULTURE

## 2015-04-03 LAB — CBC
HEMATOCRIT: 33.4 % — AB (ref 36.0–46.0)
HEMOGLOBIN: 10.7 g/dL — AB (ref 12.0–15.0)
MCH: 30.7 pg (ref 26.0–34.0)
MCHC: 32 g/dL (ref 30.0–36.0)
MCV: 95.7 fL (ref 78.0–100.0)
PLATELETS: 244 10*3/uL (ref 150–400)
RBC: 3.49 MIL/uL — ABNORMAL LOW (ref 3.87–5.11)
RDW: 14.2 % (ref 11.5–15.5)
WBC: 4.5 10*3/uL (ref 4.0–10.5)

## 2015-04-03 LAB — URINE CULTURE
CULTURE: NO GROWTH
Colony Count: NO GROWTH

## 2015-04-03 LAB — LACTATE DEHYDROGENASE: LDH: 137 U/L (ref 94–250)

## 2015-04-03 LAB — PROTIME-INR
INR: 1.11 (ref 0.00–1.49)
Prothrombin Time: 14.5 seconds (ref 11.6–15.2)

## 2015-04-03 MED ORDER — IPRATROPIUM-ALBUTEROL 0.5-2.5 (3) MG/3ML IN SOLN
3.0000 mL | Freq: Three times a day (TID) | RESPIRATORY_TRACT | Status: DC
  Administered 2015-04-03 – 2015-04-04 (×3): 3 mL via RESPIRATORY_TRACT
  Filled 2015-04-03 (×3): qty 3

## 2015-04-03 MED ORDER — BENZONATATE 100 MG PO CAPS
100.0000 mg | ORAL_CAPSULE | Freq: Two times a day (BID) | ORAL | Status: DC | PRN
  Administered 2015-04-03 (×2): 100 mg via ORAL
  Filled 2015-04-03 (×2): qty 1

## 2015-04-03 MED ORDER — DEXTROMETHORPHAN POLISTIREX ER 30 MG/5ML PO SUER
30.0000 mg | Freq: Two times a day (BID) | ORAL | Status: DC
  Administered 2015-04-03 – 2015-04-04 (×3): 30 mg via ORAL
  Filled 2015-04-03 (×3): qty 5

## 2015-04-03 NOTE — Progress Notes (Signed)
Pt O2 sats 91% at rest on RA.  Have placed back on 1L of O2 via [place], sats are at 94%.  Will continue to monitor.

## 2015-04-03 NOTE — Progress Notes (Signed)
97% on RA at rest.

## 2015-04-03 NOTE — Progress Notes (Signed)
INITIAL NUTRITION ASSESSMENT   DOCUMENTATION CODES Per approved criteria  -Underweight   INTERVENTION: - Continue FLD and advance to Dysphagia 3 if able - Will order Ensure Enlive po BID, each supplement provides 350 kcal and 20 grams of protein - RD to continue to monitor for needs  NUTRITION DIAGNOSIS: Difficulty chewing related to recent teeth extractions with associated pain as evidenced by pt report of extractions 03/15/15.   Goal: Pt to meet >/= 90% estimated nutrition needs  Monitor:  Meal and supplement intakes, weight trends, labs, I/O's  Reason for Assessment: Consult and Malnutrition Screening Tool assessment  49 y.o. female  Admitting Dx: CAP (community acquired pneumonia)  ASSESSMENT: Pt is a 49 year old with hx which includes PTSD and possible COPD. She is hard of hearing. Pt reports she had multiple teeth pulled on 03/15/15. Since that time, she has had pain with intakes and has mainly consumed items that require minimal to no chewing.  Pt seen for MST: 2 and consult for assessment of nutrition requirements and status. BMI indicates underweight status. Pt ate 0% of breakfast and 25% lunch yesterday per chart review. Visualized breakfast tray with 25-50% completion and pt reports she is saving jello and juice for later.   Pt indicates that since teeth extractions she has not been consuming much PO and that her son bought her Ensure. Pt very interested in receiving a supplement here. Pt reports UBW of 153 lbs and that she last weighed this prior to extractions. Question current weight recording based on visualization of pt. No signs of muscle or fat wasting or edema during physical assessment.   Not meeting needs at this time. Labs and medications reviewed; K: 3.1 mmol/L.  Height: Ht Readings from Last 1 Encounters:  04/01/15  (1.575 m)    Weight: Wt Readings from Last 1 Encounters:  04/01/15 100 lb 12 oz (45.7 kg)    Ideal Body Weight: 110 lbs (50 kg)  %  Ideal Body Weight: 91%  Wt Readings from Last 10 Encounters:  04/01/15 100 lb 12 oz (45.7 kg)  05/20/12 140 lb (63.504 kg)  03/15/12 130 lb (58.968 kg)    Usual Body Weight: per pt report: 153 lbs (69.54 kg)  % Usual Body Weight: 65%  BMI:  Body mass index is 18.42 kg/(m^2).  Estimated Nutritional Needs: Kcal: 1300-1500  Protein: 60-80 grams Fluid: 2L/day  Skin: WDL  Diet Order: Diet regular Room service appropriate?: Yes; Fluid consistency:: Thin  EDUCATION NEEDS: -No education needs identified at this time   Intake/Output Summary (Last 24 hours) at 04/03/15 1003 Last data filed at 04/03/15 0605  Gross per 24 hour  Intake 1975.33 ml  Output    350 ml  Net 1625.33 ml    Last BM: PTA   Labs:   Recent Labs Lab 04/01/15 1635 04/02/15 0454 04/02/15 1138 04/03/15 0814  NA 139 140  --  140  K 3.2* 3.1*  --  3.9  CL 103 108  --  113*  CO2 25 27  --  23  BUN 9 8  --  9  CREATININE 0.72 0.62  --  0.66  CALCIUM 9.2 8.4  --  8.2*  MG  --   --  1.8  --   PHOS  --   --  2.5  --   GLUCOSE 111* 91  --  83    CBG (last 3)  No results for input(s): GLUCAP in the last 72 hours.  Scheduled Meds: .  acetaminophen  650 mg Oral Once  . azithromycin  500 mg Intravenous Q24H  . budesonide-formoterol  2 puff Inhalation BID  . cefTRIAXone (ROCEPHIN)  IV  1 g Intravenous Q24H  . dextromethorphan  30 mg Oral BID  . enoxaparin (LOVENOX) injection  40 mg Subcutaneous Q24H  . feeding supplement (ENSURE ENLIVE)  237 mL Oral BID BM  . ipratropium-albuterol  3 mL Nebulization TID  . morphine  60 mg Oral Q12H    Continuous Infusions: . sodium chloride 50 mL/hr at 04/02/15 1702    Past Medical History  Diagnosis Date  . Endometriosis   . PTSD (post-traumatic stress disorder)   . Neuropathic pain   . Chronic back pain   . Fibromyalgia   . COPD, mild   . Anxiety     Past Surgical History  Procedure Laterality Date  . Tubal ligation    . Copolscopy    .  Tonsillectomy    . Cesarean section      Trenton GammonJessica Michale Emmerich, RD, LDN Inpatient Clinical Dietitian Pager # [phone] After hours/weekend pager # [phone]

## 2015-04-03 NOTE — Progress Notes (Signed)
Patient is 93% on RA.  Will check during ambulation when patient is ready to walk.

## 2015-04-03 NOTE — Progress Notes (Signed)
Progress Note   Michele Lucas ZOX:[id number] DOB: 06/14/1966 DOA: 04/01/2015 [facility]   Brief Narrative:   Michele BariCarla Lucas is an 49 y.o. female with a PMH of COPD managed with Symbicort who was admitted 04/01/15 chief complaint of cough, myalgias and headache. On initial evaluation, vital signs showed BP 127/45, pulse 129, respirations 22 and O2 sats ration of 89%. Chest x-ray showed a right lower lobe pneumonia.  Assessment/Plan:   Principal Problem:   CAP (community acquired pneumonia) - Continue empiric Rocephin/azithromycin. - Strep pneumonia negative. HIV nonreactive. Influenza panel negative. - Follow-up sputum cultures, blood cultures, Legionella antigens.  Active Problems:   Urolbilinogen level high in urine - No elevation of LFTs or evidence of biliary obstruction. GGT levels WNL. Acute hepatitis serologies negative. - Check PT, haptoglobin, LDH rule out hemolysis. - Repeat U/A.    Normocytic anemia - 2 g drop in hemoglobin likely secondary to dilutional factors. Monitor.    Hypokalemia - Given 4 runs of IV potassium 04/02/15.    Chronic pain syndrome / opiate dependency - Patient is on chronic MS Contin 60 mg every 12 hours and oxycodone for breakthrough pain. - Lyrica currently on hold.    Anxiety - Continue Klonopin.    COPD (chronic obstructive pulmonary disease) - Continue bronchodilators every 6 hours. Continue Symbicort.    DVT Prophylaxis - Continue Lovenox.  Code Status: Full. Family Communication: Husband, Michele Lucas, is emergency contact.  Updated by telephone.  He can be reached at [phone]. Disposition Plan: Home when respiratory status stable, cultures/work up completed.   IV Access:    Peripheral IV   Procedures and diagnostic studies:   Dg Chest 2 View 04/01/2015: RIGHT lower lobe pneumonia.     Medical Consultants:    None.  Anti-Infectives:    Rocephin 04/01/15--->  Azithromycin 04/01/15--->  Tamiflu  04/01/15--->04/02/15  Subjective:   Michele Lucas says she is less dyspneic, still with a cough.  Says her urine is still dark in color.  Still a bit hoarse from coughing spell, yellow sputum.  Allergic to Mucinex.  Objective:    Filed Vitals:   04/03/15 0115 04/03/15 0127 04/03/15 0225 04/03/15 0536  BP:   136/58 141/57  Pulse:   73 71  Temp:   98.3 F (36.8 C) 98.4 F (36.9 C)  TempSrc:   Oral Oral  Resp:   18 18  Height:      Weight:      SpO2: 90% 96% 99% 100%    Intake/Output Summary (Last 24 hours) at 04/03/15 0719 Last data filed at 04/03/15 [id number]  Gross per 24 hour  Intake 1324.5 ml  Output    475 ml  Net  849.5 ml    Exam: Gen:  NAD, oxygen on Cardiovascular:  Tachy, No M/R/G Respiratory:  Lungs with scattered rhonchi on right.  Occ wheeze Gastrointestinal:  Abdomen soft, NT/ND, + BS Extremities:  No C/E/C   Data Reviewed:    Labs: Basic Metabolic Panel:  Recent Labs Lab 04/01/15 1635 04/02/15 0454 04/02/15 1138  NA 139 140  --   K 3.2* 3.1*  --   CL 103 108  --   CO2 25 27  --   GLUCOSE 111* 91  --   BUN 9 8  --   CREATININE 0.72 0.62  --   CALCIUM 9.2 8.4  --   MG  --   --  1.8  PHOS  --   --  2.5   GFR  Estimated Creatinine Clearance: 60.7 mL/min (by C-G formula based on Cr of 0.62). Liver Function Tests:  Recent Labs Lab 04/01/15 1635 04/02/15 0454  AST 30 17  ALT 21 15  ALKPHOS 76 55  BILITOT 1.0 0.8  PROT 7.8 6.4  ALBUMIN 3.7 3.0*   CBC:  Recent Labs Lab 04/01/15 1635 04/02/15 0454  WBC 8.0 5.8  NEUTROABS 5.0 3.1  HGB 12.2 10.8*  HCT 38.4 34.1*  MCV 94.6 95.5  PLT 249 242   Thyroid function studies:  Recent Labs  04/02/15 1138  TSH 0.318*    Sepsis Labs:  Recent Labs Lab 04/01/15 1635 04/01/15 1703 04/02/15 0454  WBC 8.0  --  5.8  LATICACIDVEN  --  1.19  --    Microbiology Recent Results (from the past 240 hour(s))  Culture, blood (routine x 2)     Status: None (Preliminary result)   Collection  Time: 04/01/15  4:35 PM  Result Value Ref Range Status   Specimen Description BLOOD RIGHT HAND  Final   Special Requests BOTTLES DRAWN AEROBIC ONLY 2 CC  Final   Culture   Final           BLOOD CULTURE RECEIVED NO GROWTH TO DATE CULTURE WILL BE HELD FOR 5 DAYS BEFORE ISSUING A FINAL NEGATIVE REPORT Note: Culture results may be compromised due to an inadequate volume of blood received in culture bottles. Performed at [facility]    Report Status PENDING  Incomplete  Culture, blood (routine x 2)     Status: None (Preliminary result)   Collection Time: 04/01/15  4:35 PM  Result Value Ref Range Status   Specimen Description BLOOD BLOOD RIGHT FOREARM  Final   Special Requests BOTTLES DRAWN AEROBIC AND ANAEROBIC 2 CC EACH  Final   Culture   Final           BLOOD CULTURE RECEIVED NO GROWTH TO DATE CULTURE WILL BE HELD FOR 5 DAYS BEFORE ISSUING A FINAL NEGATIVE REPORT Note: Culture results may be compromised due to an inadequate volume of blood received in culture bottles. Performed at [facility]    Report Status PENDING  Incomplete  Urine culture     Status: None   Collection Time: 04/01/15  4:52 PM  Result Value Ref Range Status   Specimen Description URINE, RANDOM  Final   Special Requests NONE  Final   Colony Count NO GROWTH Performed at [facility]   Final   Culture NO GROWTH Performed at [facility]   Final   Report Status 04/03/2015 FINAL  Final  Culture, sputum-assessment     Status: None   Collection Time: 04/01/15  9:29 PM  Result Value Ref Range Status   Specimen Description SPU  Final   Special Requests Normal  Final   Sputum evaluation   Final    MICROSCOPIC FINDINGS SUGGEST THAT THIS SPECIMEN IS NOT REPRESENTATIVE OF LOWER RESPIRATORY SECRETIONS. PLEASE RECOLLECT. NOTIFIED Oretha Ellis [id number] @ 1034 BY J SCOTTON    Report Status 04/01/2015 FINAL  Final  Culture, expectorated sputum-assessment     Status: None   Collection  Time: 04/02/15  7:33 AM  Result Value Ref Range Status   Specimen Description SPUTUM  Final   Special Requests NONE  Final   Sputum evaluation   Final    MICROSCOPIC FINDINGS SUGGEST THAT THIS SPECIMEN IS NOT REPRESENTATIVE OF LOWER RESPIRATORY SECRETIONS. PLEASE RECOLLECT. V JACKSON AT 0854 ON 04.24.2016 BY NBROOKS  Report Status 04/02/2015 FINAL  Final     Medications:   . acetaminophen  650 mg Oral Once  . azithromycin  500 mg Intravenous Q24H  . budesonide-formoterol  2 puff Inhalation BID  . cefTRIAXone (ROCEPHIN)  IV  1 g Intravenous Q24H  . enoxaparin (LOVENOX) injection  40 mg Subcutaneous Q24H  . feeding supplement (ENSURE ENLIVE)  237 mL Oral BID BM  . ipratropium-albuterol  3 mL Nebulization TID  . morphine  60 mg Oral Q12H   Continuous Infusions: . sodium chloride 50 mL/hr at 04/02/15 1702    Time spent: 25 minutes.    LOS: 2 days   RAMA,CHRISTINA  [facility] Pager [phone]. If unable to reach me by pager, please call my cell phone at [phone].  *Please refer to [email or web address], password [email or web address] to get updated schedule on who will round on this patient, as hospitalists switch teams weekly. If 7PM-7AM, please contact night-coverage at [email or web address], password [email or web address] for any overnight needs.  04/03/2015, 7:19 AM

## 2015-04-04 LAB — LEGIONELLA ANTIGEN, URINE

## 2015-04-04 LAB — HAPTOGLOBIN: HAPTOGLOBIN: 193 mg/dL (ref 34–200)

## 2015-04-04 MED ORDER — ACETAMINOPHEN 325 MG PO TABS: 650.0000 mg | ORAL_TABLET | ORAL | Status: DC | PRN

## 2015-04-04 MED ORDER — BENZONATATE 100 MG PO CAPS: 100.0000 mg | ORAL_CAPSULE | Freq: Two times a day (BID) | ORAL | Status: DC | PRN

## 2015-04-04 MED ORDER — AZITHROMYCIN 500 MG PO TABS
500.0000 mg | ORAL_TABLET | ORAL | Status: DC
  Filled 2015-04-04: qty 1

## 2015-04-04 MED ORDER — CEFUROXIME AXETIL 500 MG PO TABS: 500.0000 mg | ORAL_TABLET | Freq: Two times a day (BID) | ORAL | Status: DC

## 2015-04-04 MED ORDER — DEXTROMETHORPHAN POLISTIREX ER 30 MG/5ML PO SUER: 30.0000 mg | Freq: Two times a day (BID) | ORAL | Status: DC

## 2015-04-04 MED ORDER — AZITHROMYCIN 250 MG PO TABS: ORAL_TABLET | ORAL | Status: DC

## 2015-04-04 NOTE — Progress Notes (Signed)
D/C home no needs or orders.

## 2015-04-04 NOTE — Discharge Summary (Signed)
Physician Discharge Summary  Michele Lucas BJY:[id number] DOB: 04/24/1966 DOA: 04/01/2015  [facility]: [facility]  Admit date: 04/01/2015 Discharge date: 04/04/2015   Recommendations for Outpatient Follow-Up:   1. The patient has outpatient follow-up at the [facility] on 04/11/15. 2. Follow-up final sputum and blood cultures when available.   Discharge Diagnosis:   Principal Problem:    CAP (community acquired pneumonia) Active Problems:    COPD (chronic obstructive pulmonary disease)    Chronic pain syndrome    Generalized anxiety disorder    Hypokalemia    Normocytic anemia    Abnormal urinalysis    SOB (shortness of breath)   Discharge disposition:  Home.    Discharge Condition: Improved.  Diet recommendation:  Regular.   History of Present Illness:   Michele Lucas is an 49 y.o. female with a PMH of COPD managed with Symbicort who was admitted 04/01/15 chief complaint of cough, myalgias and headache. On initial evaluation, vital signs showed BP 127/45, pulse 129, respirations 22 and O2 sats ration of 89%. Chest x-ray showed a right lower lobe pneumonia.   Hospital Course by Problem:   Principal Problem:  CAP (community acquired pneumonia) - Treated with empiric Rocephin/azithromycin with rapid improvement in symptoms. - Discharge home on Ceftin/azithromycin to complete a 7 day course of Ceftin and a five-day course of azithromycin. - Strep pneumonia and legionella negative. HIV nonreactive. Influenza panel negative. - Follow-up sputum cultures, blood cultures.  Active Problems:  Urolbilinogen level high in urine - No elevation of LFTs or evidence of biliary obstruction. GGT levels WNL. Acute hepatitis serologies negative. - PT, haptoglobin, and LDH WNL, no evidence of hemolysis. - Repeat U/A negative for bilirubin.   Normocytic anemia - 2 g drop in hemoglobin likely secondary to dilutional factors.    Hypokalemia - Given 4 runs of IV potassium  04/02/15.   Chronic pain syndrome / opiate dependency - Patient is on chronic MS Contin 60 mg every 12 hours and oxycodone for breakthrough pain. - Lyrica currently on hold.   Anxiety - Continue Klonopin.   COPD (chronic obstructive pulmonary disease) - Continue bronchodilators every 6 hours. Continue Symbicort.    Medical Consultants:    None.   Discharge Exam:   Filed Vitals:   04/04/15 0559  BP: 124/66  Pulse: 70  Temp: 98 F (36.7 C)  Resp: 18   Filed Vitals:   04/03/15 2139 04/03/15 2205 04/04/15 0559 04/04/15 0812  BP: 130/53  124/66   Pulse: 76  70   Temp: 98.3 F (36.8 C)  98 F (36.7 C)   TempSrc: Oral  Oral   Resp: 18  18   Height:      Weight:      SpO2: 96% 98% 94% 92%    Gen:  NAD Cardiovascular:  RRR, No M/R/G Respiratory: Lungs CTAB Gastrointestinal: Abdomen soft, NT/ND with normal active bowel sounds. Extremities: No C/E/C   The results of significant diagnostics from this hospitalization (including imaging, microbiology, ancillary and laboratory) are listed below for reference.     Procedures and Diagnostic Studies:   Dg Chest 2 View 04/01/2015: RIGHT lower lobe pneumonia.   Labs:   Basic Metabolic Panel:  Recent Labs Lab 04/01/15 1635 04/02/15 0454 04/02/15 1138 04/03/15 0814  NA 139 140  --  140  K 3.2* 3.1*  --  3.9  CL 103 108  --  113*  CO2 25 27  --  23  GLUCOSE 111* 91  --  83  BUN 9 8  --  9  CREATININE 0.72 0.62  --  0.66  CALCIUM 9.2 8.4  --  8.2*  MG  --   --  1.8  --   PHOS  --   --  2.5  --    GFR Estimated Creatinine Clearance: 60.7 mL/min (by C-G formula based on Cr of 0.66). Liver Function Tests:  Recent Labs Lab 04/01/15 1635 04/02/15 0454  AST 30 17  ALT 21 15  ALKPHOS 76 55  BILITOT 1.0 0.8  PROT 7.8 6.4  ALBUMIN 3.7 3.0*   Coagulation profile  Recent Labs Lab 04/03/15 0814  INR 1.11    CBC:  Recent Labs Lab 04/01/15 1635 04/02/15 0454 04/03/15 0814  WBC 8.0 5.8 4.5   NEUTROABS 5.0 3.1  --   HGB 12.2 10.8* 10.7*  HCT 38.4 34.1* 33.4*  MCV 94.6 95.5 95.7  PLT 249 242 244   Thyroid function studies  Recent Labs  04/02/15 1138  TSH 0.318*   Microbiology Recent Results (from the past 240 hour(s))  Culture, blood (routine x 2)     Status: None (Preliminary result)   Collection Time: 04/01/15  4:35 PM  Result Value Ref Range Status   Specimen Description BLOOD RIGHT HAND  Final   Special Requests BOTTLES DRAWN AEROBIC ONLY 2 CC  Final   Culture   Final           BLOOD CULTURE RECEIVED NO GROWTH TO DATE CULTURE WILL BE HELD FOR 5 DAYS BEFORE ISSUING A FINAL NEGATIVE REPORT Note: Culture results may be compromised due to an inadequate volume of blood received in culture bottles. Performed at [facility]    Report Status PENDING  Incomplete  Culture, blood (routine x 2)     Status: None (Preliminary result)   Collection Time: 04/01/15  4:35 PM  Result Value Ref Range Status   Specimen Description BLOOD BLOOD RIGHT FOREARM  Final   Special Requests BOTTLES DRAWN AEROBIC AND ANAEROBIC 2 CC EACH  Final   Culture   Final           BLOOD CULTURE RECEIVED NO GROWTH TO DATE CULTURE WILL BE HELD FOR 5 DAYS BEFORE ISSUING A FINAL NEGATIVE REPORT Note: Culture results may be compromised due to an inadequate volume of blood received in culture bottles. Performed at [facility]    Report Status PENDING  Incomplete  Urine culture     Status: None   Collection Time: 04/01/15  4:52 PM  Result Value Ref Range Status   Specimen Description URINE, RANDOM  Final   Special Requests NONE  Final   Colony Count NO GROWTH Performed at [facility]   Final   Culture NO GROWTH Performed at [facility]   Final   Report Status 04/03/2015 FINAL  Final  Culture, sputum-assessment     Status: None   Collection Time: 04/01/15  9:29 PM  Result Value Ref Range Status   Specimen Description SPU  Final   Special Requests Normal  Final    Sputum evaluation   Final    MICROSCOPIC FINDINGS SUGGEST THAT THIS SPECIMEN IS NOT REPRESENTATIVE OF LOWER RESPIRATORY SECRETIONS. PLEASE RECOLLECT. NOTIFIED Oretha Ellis [id number] @ 1034 BY J SCOTTON    Report Status 04/01/2015 FINAL  Final  Culture, expectorated sputum-assessment     Status: None   Collection Time: 04/02/15  7:33 AM  Result Value Ref Range Status   Specimen Description SPUTUM  Final  Special Requests NONE  Final   Sputum evaluation   Final    MICROSCOPIC FINDINGS SUGGEST THAT THIS SPECIMEN IS NOT REPRESENTATIVE OF LOWER RESPIRATORY SECRETIONS. PLEASE RECOLLECT. Levert Feinstein AT 1610 ON 04.24.2016 BY NBROOKS    Report Status 04/02/2015 FINAL  Final  Culture, expectorated sputum-assessment     Status: None   Collection Time: 04/03/15  9:41 AM  Result Value Ref Range Status   Specimen Description SPUTUM  Final   Special Requests NONE  Final   Sputum evaluation   Final    THIS SPECIMEN IS ACCEPTABLE. RESPIRATORY CULTURE REPORT TO FOLLOW.   Report Status 04/03/2015 FINAL  Final  Culture, respiratory (NON-Expectorated)     Status: None (Preliminary result)   Collection Time: 04/03/15  9:41 AM  Result Value Ref Range Status   Specimen Description SPUTUM  Final   Special Requests NONE  Final   Gram Stain   Final    FEW WBC PRESENT,BOTH PMN AND MONONUCLEAR RARE SQUAMOUS EPITHELIAL CELLS PRESENT NO ORGANISMS SEEN Performed at [facility]    Culture   Final    Culture reincubated for better growth Performed at [facility]    Report Status PENDING  Incomplete     Discharge Instructions:   Discharge Instructions    Call MD for:  difficulty breathing, headache or visual disturbances    Complete by:  As directed      Call MD for:  extreme fatigue    Complete by:  As directed      Call MD for:  hives    Complete by:  As directed      Call MD for:  severe uncontrolled pain    Complete by:  As directed      Call MD for:  temperature >100.4     Complete by:  As directed      Diet general    Complete by:  As directed      Discharge instructions    Complete by:  As directed   You were treated for pneumonia while you were in the hospital.  It is important that you see your PCP in follow up, and have him/her order a follow up chest x-ray in 4-6 weeks to ensure resolution of the pneumonia and to exclude any underlying pathology.  Take all of your antibiotics, as prescribed, even if you feel better.  Do not discontinue antibiotics prematurely.     Increase activity slowly    Complete by:  As directed             Medication List    STOP taking these medications        baclofen 10 MG tablet  Commonly known as:  LIORESAL     oxyCODONE-acetaminophen 5-325 MG per tablet  Commonly known as:  PERCOCET/ROXICET      TAKE these medications        acetaminophen 325 MG tablet  Commonly known as:  TYLENOL  Take 2 tablets (650 mg total) by mouth every 4 (four) hours as needed for mild pain, fever or headache.     azithromycin 250 MG tablet  Commonly known as:  ZITHROMAX  Take for 2 more days.     benzonatate 100 MG capsule  Commonly known as:  TESSALON  Take 1 capsule (100 mg total) by mouth 2 (two) times daily as needed for cough.     budesonide-formoterol 80-4.5 MCG/ACT inhaler  Commonly known as:  SYMBICORT  Inhale 2 puffs into the  lungs 2 (two) times daily.     cefUROXime 500 MG tablet  Commonly known as:  CEFTIN  Take 1 tablet (500 mg total) by mouth 2 (two) times daily with a meal.     clonazePAM 0.5 MG tablet  Commonly known as:  KLONOPIN  Take 0.5 mg by mouth 2 (two) times daily as needed for anxiety (anxiety).     dextromethorphan 30 MG/5ML liquid  Commonly known as:  DELSYM  Take 5 mLs (30 mg total) by mouth 2 (two) times daily.     fluticasone 50 MCG/ACT nasal spray  Commonly known as:  FLONASE  Place 2 sprays into the nose daily.     medroxyPROGESTERone 150 MG/ML injection  Commonly known as:   DEPO-PROVERA  Inject 150 mg into the muscle every 3 (three) months.     morphine 60 MG 12 hr tablet  Commonly known as:  MS CONTIN  Take 60 mg by mouth every 12 (twelve) hours.     oxyCODONE 5 MG immediate release tablet  Commonly known as:  Oxy IR/ROXICODONE  Take 15 mg by mouth 3 (three) times daily. Pain     pregabalin 50 MG capsule  Commonly known as:  LYRICA  Take 50 mg by mouth every morning.           Follow-up Information    [facility].   Specialty:  General Practice   Why:  Hospital follow up for pneumonia.   Contact information:   [address]  [facility] [place] [id number] [phone]        Time coordinating discharge: 35 minutes.  Signed:  Lelon Lucas  Pager [phone] [facility] 04/04/2015, 6:16 PM

## 2015-04-04 NOTE — Care Management Note (Signed)
    Page 1 of 1   04/04/2015     1:04:52 PM CARE MANAGEMENT NOTE 04/04/2015  Patient:  Michele Lucas,Michele Lucas   Account Number:  [id number]  Date Initiated:  04/04/2015  Documentation initiated by:  Lanier ClamMAHABIR,Thimothy Barretta  Subjective/Objective Assessment:   49 y/o f admitted w/Pna.     Action/Plan:   From home.   Anticipated DC Date:  04/04/2015   Anticipated DC Plan:  HOME/SELF CARE      DC Planning Services  CM consult      Choice offered to / List presented to:             Status of service:  Completed, signed off Medicare Important Message given?   (If response is "NO", the following Medicare IM given date fields will be blank) Date Medicare IM given:   Medicare IM given by:   Date Additional Medicare IM given:   Additional Medicare IM given by:    Discharge Disposition:  HOME/SELF CARE  Per UR Regulation:  Reviewed for med. necessity/level of care/duration of stay  If discussed at Long Length of Stay Meetings, dates discussed:    Comments:  04/04/15 Lanier ClamKathy Micayla Brathwaite RN BSN [phone] d/c home no needs or orders.

## 2015-04-04 NOTE — Progress Notes (Signed)
D/C home, no needs or orders.

## 2015-04-04 NOTE — Progress Notes (Signed)
This patient is receiving the antibiotic Azithromycin by the intravenous route. Based on criteria approved by the Pharmacy and Therapeutics Committee, and the Infectious Disease Division, the antibiotic(s) is / are being converted to equivalent oral dose form(s). These criteria include: . Patient being treated for a respiratory tract infection, urinary tract infection, cellulitis, or Clostridium Difficile Associated Diarrhea . The patient is not neutropenic and does not exhibit a GI malabsorption state . The patient is eating (either orally or per tube) and/or has been taking other orally administered medications for at least 24 hours. . The patient is improving clinically (physician assessment and a 24-hour Tmax of < 100.5 F).  If you have questions about this conversion, please contact the pharmacy department. Thank you.  Clance BollAmanda Aarushi Hemric, PharmD, BCPS Pager: [phone] 04/04/2015 8:35 AM

## 2015-04-04 NOTE — Discharge Instructions (Signed)
Cough, Adult ° A cough is a reflex that helps clear your throat and airways. It can help heal the body or may be a reaction to an irritated airway. A cough may only last 2 or 3 weeks (acute) or may last more than 8 weeks (chronic).  °CAUSES °Acute cough: °· Viral or bacterial infections. °Chronic cough: °· Infections. °· Allergies. °· Asthma. °· Post-nasal drip. °· Smoking. °· Heartburn or acid reflux. °· Some medicines. °· Chronic lung problems (COPD). °· Cancer. °SYMPTOMS  °· Cough. °· Fever. °· Chest pain. °· Increased breathing rate. °· High-pitched whistling sound when breathing (wheezing). °· Colored mucus that you cough up (sputum). °TREATMENT  °· A bacterial cough may be treated with antibiotic medicine. °· A viral cough must run its course and will not respond to antibiotics. °· Your caregiver may recommend other treatments if you have a chronic cough. °HOME CARE INSTRUCTIONS  °· Only take over-the-counter or prescription medicines for pain, discomfort, or fever as directed by your caregiver. Use cough suppressants only as directed by your caregiver. °· Use a cold steam vaporizer or humidifier in your bedroom or home to help loosen secretions. °· Sleep in a semi-upright position if your cough is worse at night. °· Rest as needed. °· Stop smoking if you smoke. °SEEK IMMEDIATE MEDICAL CARE IF:  °· You have pus in your sputum. °· Your cough starts to worsen. °· You cannot control your cough with suppressants and are losing sleep. °· You begin coughing up blood. °· You have difficulty breathing. °· You develop pain which is getting worse or is uncontrolled with medicine. °· You have a fever. °MAKE SURE YOU:  °· Understand these instructions. °· Will watch your condition. °· Will get help right away if you are not doing well or get worse. °Document Released: 05/24/2011 Document Revised: 02/17/2012 Document Reviewed: 05/24/2011 °ExitCare® Patient Information ©2015 [employer]. This information is not intended  to replace advice given to you by your health care provider. Make sure you discuss any questions you have with your health care provider. °Pneumonia °Pneumonia is an infection of the lungs.  °CAUSES °Pneumonia may be caused by bacteria or a virus. Usually, these infections are caused by breathing infectious particles into the lungs (respiratory tract). °SIGNS AND SYMPTOMS  °· Cough. °· Fever. °· Chest pain. °· Increased rate of breathing. °· Wheezing. °· Mucus production. °DIAGNOSIS  °If you have the common symptoms of pneumonia, your health care provider will typically confirm the diagnosis with a chest X-ray. The X-ray will show an abnormality in the lung (pulmonary infiltrate) if you have pneumonia. Other tests of your blood, urine, or sputum may be done to find the specific cause of your pneumonia. Your health care provider may also do tests (blood gases or pulse oximetry) to see how well your lungs are working. °TREATMENT  °Some forms of pneumonia may be spread to other people when you cough or sneeze. You may be asked to wear a mask before and during your exam. Pneumonia that is caused by bacteria is treated with antibiotic medicine. Pneumonia that is caused by the influenza virus may be treated with an antiviral medicine. Most other viral infections must run their course. These infections will not respond to antibiotics.  °HOME CARE INSTRUCTIONS  °· Cough suppressants may be used if you are losing too much rest. However, coughing protects you by clearing your lungs. You should avoid using cough suppressants if you can. °· Your health care provider may have   prescribed medicine if he or she thinks your pneumonia is caused by bacteria or influenza. Finish your medicine even if you start to feel better. °· Your health care provider may also prescribe an expectorant. This loosens the mucus to be coughed up. °· Take medicines only as directed by your health care provider. °· Do not smoke. Smoking is a common cause  of bronchitis and can contribute to pneumonia. If you are a smoker and continue to smoke, your cough may last several weeks after your pneumonia has cleared. °· A cold steam vaporizer or humidifier in your room or home may help loosen mucus. °· Coughing is often worse at night. Sleeping in a semi-upright position in a recliner or using a couple pillows under your head will help with this. °· Get rest as you feel it is needed. Your body will usually let you know when you need to rest. °PREVENTION °A pneumococcal shot (vaccine) is available to prevent a common bacterial cause of pneumonia. This is usually suggested for: °· People over 65 years old. °· Patients on chemotherapy. °· People with chronic lung problems, such as bronchitis or emphysema. °· People with immune system problems. °If you are over 65 or have a high risk condition, you may receive the pneumococcal vaccine if you have not received it before. In some countries, a routine influenza vaccine is also recommended. This vaccine can help prevent some cases of pneumonia. You may be offered the influenza vaccine as part of your care. °If you smoke, it is time to quit. You may receive instructions on how to stop smoking. Your health care provider can provide medicines and counseling to help you quit. °SEEK MEDICAL CARE IF: °You have a fever. °SEEK IMMEDIATE MEDICAL CARE IF:  °· Your illness becomes worse. This is especially true if you are elderly or weakened from any other disease. °· You cannot control your cough with suppressants and are losing sleep. °· You begin coughing up blood. °· You develop pain which is getting worse or is uncontrolled with medicines. °· Any of the symptoms which initially brought you in for treatment are getting worse rather than better. °· You develop shortness of breath or chest pain. °MAKE SURE YOU:  °· Understand these instructions. °· Will watch your condition. °· Will get help right away if you are not doing well or get  worse. °Document Released: 11/25/2005 Document Revised: 04/11/2014 Document Reviewed: 02/14/2011 °ExitCare® Patient Information ©2015 [employer]. This information is not intended to replace advice given to you by your health care provider. Make sure you discuss any questions you have with your health care provider. ° °

## 2015-04-06 LAB — CULTURE, RESPIRATORY W GRAM STAIN: Culture: NORMAL

## 2015-04-06 LAB — CULTURE, RESPIRATORY

## 2015-04-07 LAB — CULTURE, BLOOD (ROUTINE X 2)
CULTURE: NO GROWTH
Culture: NO GROWTH

## 2015-10-01 ENCOUNTER — Encounter (HOSPITAL_COMMUNITY): Payer: Self-pay | Admitting: Emergency Medicine

## 2015-10-01 ENCOUNTER — Emergency Department (HOSPITAL_COMMUNITY)
Admission: EM | Admit: 2015-10-01 | Discharge: 2015-10-01 | Disposition: A | Discharge status: Home / Self Care | Payer: Medicare Other | Attending: Emergency Medicine | Admitting: Emergency Medicine

## 2015-10-01 DIAGNOSIS — Z79899 Other long term (current) drug therapy: Secondary | ICD-10-CM | POA: Diagnosis not present

## 2015-10-01 DIAGNOSIS — G8929 Other chronic pain: Secondary | ICD-10-CM | POA: Diagnosis not present

## 2015-10-01 DIAGNOSIS — Z9851 Tubal ligation status: Secondary | ICD-10-CM | POA: Insufficient documentation

## 2015-10-01 DIAGNOSIS — N809 Endometriosis, unspecified: Secondary | ICD-10-CM | POA: Insufficient documentation

## 2015-10-01 DIAGNOSIS — Z720 Tobacco use: Secondary | ICD-10-CM | POA: Insufficient documentation

## 2015-10-01 DIAGNOSIS — F419 Anxiety disorder, unspecified: Secondary | ICD-10-CM | POA: Insufficient documentation

## 2015-10-01 DIAGNOSIS — Z793 Long term (current) use of hormonal contraceptives: Secondary | ICD-10-CM | POA: Diagnosis not present

## 2015-10-01 DIAGNOSIS — M549 Dorsalgia, unspecified: Secondary | ICD-10-CM | POA: Diagnosis not present

## 2015-10-01 DIAGNOSIS — M79604 Pain in right leg: Secondary | ICD-10-CM | POA: Insufficient documentation

## 2015-10-01 DIAGNOSIS — Z3202 Encounter for pregnancy test, result negative: Secondary | ICD-10-CM | POA: Insufficient documentation

## 2015-10-01 DIAGNOSIS — J449 Chronic obstructive pulmonary disease, unspecified: Secondary | ICD-10-CM | POA: Insufficient documentation

## 2015-10-01 LAB — URINALYSIS, ROUTINE W REFLEX MICROSCOPIC
Bilirubin Urine: NEGATIVE
GLUCOSE, UA: NEGATIVE mg/dL
Hgb urine dipstick: NEGATIVE
Ketones, ur: NEGATIVE mg/dL
LEUKOCYTES UA: NEGATIVE
Nitrite: NEGATIVE
PROTEIN: NEGATIVE mg/dL
SPECIFIC GRAVITY, URINE: 1.024 (ref 1.005–1.030)
UROBILINOGEN UA: 1 mg/dL (ref 0.0–1.0)
pH: 5.5 (ref 5.0–8.0)

## 2015-10-01 LAB — POC URINE PREG, ED: PREG TEST UR: NEGATIVE

## 2015-10-01 MED ORDER — HYDROMORPHONE HCL 1 MG/ML IJ SOLN
1.0000 mg | Freq: Once | INTRAMUSCULAR | Status: AC
  Administered 2015-10-01: 1 mg via INTRAMUSCULAR
  Filled 2015-10-01: qty 1

## 2015-10-01 MED ORDER — OXYCODONE-ACETAMINOPHEN 5-325 MG PO TABS
2.0000 | ORAL_TABLET | Freq: Once | ORAL | Status: AC
  Administered 2015-10-01: 2 via ORAL
  Filled 2015-10-01: qty 2

## 2015-10-01 MED ORDER — KETOROLAC TROMETHAMINE 60 MG/2ML IM SOLN
60.0000 mg | Freq: Once | INTRAMUSCULAR | Status: AC
  Administered 2015-10-01: 60 mg via INTRAMUSCULAR
  Filled 2015-10-01: qty 2

## 2015-10-01 NOTE — ED Provider Notes (Signed)
CSN: [id number]     Arrival date & time 10/01/15  1118 History   First MD Initiated Contact with Patient 10/01/15 1146     Chief Complaint  Patient presents with  . Leg Pain    chronic recurrent r/leg pain  . Back Pain    chronic back pain     (Consider location/radiation/quality/duration/timing/severity/associated sxs/prior Treatment) Patient is a 49 y.o. female presenting with leg pain.  Leg Pain Location:  Leg Injury: no   Leg location:  R leg Pain details:    Quality:  Sharp   Radiates to:  Does not radiate   Severity:  Severe   Onset quality:  Gradual   Duration: chronic, worse over past 2 days.   Timing:  Constant   Progression:  Worsening Relieved by:  Nothing Exacerbated by: bearing weight. Ineffective treatments:  None tried Associated symptoms: back pain (chronic) and swelling (Intermittent swelling of both legs.  Severely in June, she says (4 months ago). none today.)   Associated symptoms: no muscle weakness, no numbness and no tingling     Past Medical History  Diagnosis Date  . Endometriosis   . PTSD (post-traumatic stress disorder)   . Neuropathic pain   . Chronic back pain   . Fibromyalgia   . COPD, mild (HCC)   . Anxiety    Past Surgical History  Procedure Laterality Date  . Tubal ligation    . Copolscopy    . Tonsillectomy    . Cesarean section     Family History  Problem Relation Age of Onset  . Diabetes type II Neg Hx   . Stroke Neg Hx    Social History  Substance Use Topics  . Smoking status: Current Every Day Smoker -- 1.00 packs/day    Types: Cigarettes  . Smokeless tobacco: None  . Alcohol Use: No     Comment: denies   OB History    No data available     Review of Systems  Musculoskeletal: Positive for back pain (chronic).  All other systems reviewed and are negative.     Allergies  Guaifenesin & derivatives; Ampicillin; and Clindamycin/lincomycin  Home Medications   Prior to Admission medications   Medication  Sig Start Date End Date Taking? Authorizing Provider  budesonide-formoterol (SYMBICORT) 80-4.5 MCG/ACT inhaler Inhale 2 puffs into the lungs 2 (two) times daily.   Yes Historical Provider, MD  clonazePAM (KLONOPIN) 0.5 MG tablet Take 0.5 mg by mouth 2 (two) times daily as needed for anxiety (anxiety).   Yes Historical Provider, MD  fluticasone (FLONASE) 50 MCG/ACT nasal spray Place 2 sprays into the nose daily.   Yes Historical Provider, MD  medroxyPROGESTERone (DEPO-PROVERA) 150 MG/ML injection Inject 150 mg into the muscle every 3 (three) months.   Yes Historical Provider, MD  morphine (MS CONTIN) 60 MG 12 hr tablet Take 60 mg by mouth every 12 (twelve) hours.    Yes Historical Provider, MD  oxyCODONE (OXY IR/ROXICODONE) 5 MG immediate release tablet Take 15 mg by mouth 3 (three) times daily. Pain   Yes Historical Provider, MD  acetaminophen (TYLENOL) 325 MG tablet Take 2 tablets (650 mg total) by mouth every 4 (four) hours as needed for mild pain, fever or headache. Patient not taking: Reported on 10/01/2015 04/04/15   Maryruth Bunhristina P Rama, MD  azithromycin (ZITHROMAX) 250 MG tablet Take for 2 more days. Patient not taking: Reported on 10/01/2015 04/04/15   Maryruth Bunhristina P Rama, MD  benzonatate (TESSALON) 100 MG capsule Take 1 capsule (  100 mg total) by mouth 2 (two) times daily as needed for cough. Patient not taking: Reported on 10/01/2015 04/04/15   Maryruth Bun Rama, MD  cefUROXime (CEFTIN) 500 MG tablet Take 1 tablet (500 mg total) by mouth 2 (two) times daily with a meal. Patient not taking: Reported on 10/01/2015 04/04/15   Maryruth Bun Rama, MD  dextromethorphan (DELSYM) 30 MG/5ML liquid Take 5 mLs (30 mg total) by mouth 2 (two) times daily. Patient not taking: Reported on 10/01/2015 04/04/15   Maryruth Bun Rama, MD   BP 141/68 mmHg  Pulse 105  Temp(Src) 98.1 F (36.7 C) (Oral)  Resp 18  SpO2 96%  LMP  Physical Exam  Constitutional: She is oriented to person, place, and time. She appears  well-developed and well-nourished. She appears distressed (lying on side, crying.).  HENT:  Head: Normocephalic and atraumatic.  Mouth/Throat: Oropharynx is clear and moist.  Eyes: Conjunctivae are normal. Pupils are equal, round, and reactive to light. No scleral icterus.  Neck: Neck supple.  Cardiovascular: Normal rate, regular rhythm, normal heart sounds and intact distal pulses.   No murmur heard. Pulmonary/Chest: Effort normal and breath sounds normal. No stridor. No respiratory distress. She has no rales.  Abdominal: Soft. Bowel sounds are normal. She exhibits no distension. There is no tenderness.  Musculoskeletal: Normal range of motion.  Hyperalgesic with even near-palpation of her entire right leg.  No focality. 2+ DP pulses and foot is warm and well perfused.  No erythema. Good sensation and strength.   Neurological: She is alert and oriented to person, place, and time.  Skin: Skin is warm and dry. No rash noted.  Psychiatric: She has a normal mood and affect. Her behavior is normal.  Nursing note and vitals reviewed.   ED Course  Procedures (including critical care time) Labs Review Labs Reviewed  URINALYSIS, ROUTINE W REFLEX MICROSCOPIC (NOT AT [facility]) - Abnormal; Notable for the following:    Color, Urine AMBER (*)    APPearance CLOUDY (*)    All other components within normal limits  POC URINE PREG, ED    Imaging Review No results found. I have personally reviewed and evaluated these images and lab results as part of my medical decision-making.   EKG Interpretation None      MDM   Final diagnoses:  Right leg pain  Chronic back pain    49 yo female with hx of chronic back and leg pain and fibromyalgia presenting with severe right leg and low back pain.  No fevers, no muscle weakness, no numbness, no tingling.  Her family reports that she develops pain this severe with some frequency.  She has been followed by her primary doctor for this pain and for her  intermittent leg swelling.    After pain meds, she was able to ambulate and had good strength in legs.  I don't think she needs any emergent spinal imaging today.  She will follow up with her PCP and spine specialist and return if needed.    Blake Divine, MD 10/01/15 [id number]

## 2015-10-01 NOTE — Discharge Instructions (Signed)

## 2015-10-01 NOTE — ED Notes (Signed)
Pt reports r/leg pain. Pt reports increased in r/leg pain this morning. [facility] and [facility]. Pt reports intermittent swelling in legs

## 2016-05-22 ENCOUNTER — Emergency Department (HOSPITAL_COMMUNITY)

## 2016-05-22 ENCOUNTER — Emergency Department (HOSPITAL_COMMUNITY)
Admission: EM | Admit: 2016-05-22 | Discharge: 2016-05-22 | Disposition: A | Discharge status: Home / Self Care | Attending: Emergency Medicine | Admitting: Emergency Medicine

## 2016-05-22 ENCOUNTER — Encounter (HOSPITAL_COMMUNITY): Payer: Self-pay | Admitting: Emergency Medicine

## 2016-05-22 DIAGNOSIS — R5383 Other fatigue: Secondary | ICD-10-CM

## 2016-05-22 DIAGNOSIS — J449 Chronic obstructive pulmonary disease, unspecified: Secondary | ICD-10-CM | POA: Diagnosis not present

## 2016-05-22 DIAGNOSIS — R059 Cough, unspecified: Secondary | ICD-10-CM

## 2016-05-22 DIAGNOSIS — Z79899 Other long term (current) drug therapy: Secondary | ICD-10-CM | POA: Diagnosis not present

## 2016-05-22 DIAGNOSIS — F17210 Nicotine dependence, cigarettes, uncomplicated: Secondary | ICD-10-CM | POA: Diagnosis not present

## 2016-05-22 DIAGNOSIS — M7989 Other specified soft tissue disorders: Secondary | ICD-10-CM | POA: Diagnosis not present

## 2016-05-22 DIAGNOSIS — R05 Cough: Secondary | ICD-10-CM

## 2016-05-22 DIAGNOSIS — R0602 Shortness of breath: Secondary | ICD-10-CM

## 2016-05-22 LAB — CBC WITH DIFFERENTIAL/PLATELET
BASOS ABS: 0 10*3/uL (ref 0.0–0.1)
Basophils Relative: 0 %
Eosinophils Absolute: 0 10*3/uL (ref 0.0–0.7)
Eosinophils Relative: 1 %
HEMATOCRIT: 40.6 % (ref 36.0–46.0)
HEMOGLOBIN: 13.5 g/dL (ref 12.0–15.0)
LYMPHS PCT: 36 %
Lymphs Abs: 1.8 10*3/uL (ref 0.7–4.0)
MCH: 30.1 pg (ref 26.0–34.0)
MCHC: 33.3 g/dL (ref 30.0–36.0)
MCV: 90.6 fL (ref 78.0–100.0)
MONO ABS: 0.5 10*3/uL (ref 0.1–1.0)
Monocytes Relative: 9 %
NEUTROS ABS: 2.7 10*3/uL (ref 1.7–7.7)
NEUTROS PCT: 54 %
Platelets: 186 10*3/uL (ref 150–400)
RBC: 4.48 MIL/uL (ref 3.87–5.11)
RDW: 13.2 % (ref 11.5–15.5)
WBC: 5.1 10*3/uL (ref 4.0–10.5)

## 2016-05-22 LAB — COMPREHENSIVE METABOLIC PANEL
ALBUMIN: 4.3 g/dL (ref 3.5–5.0)
ALT: 10 U/L — ABNORMAL LOW (ref 14–54)
AST: 15 U/L (ref 15–41)
Alkaline Phosphatase: 47 U/L (ref 38–126)
Anion gap: 8 (ref 5–15)
BILIRUBIN TOTAL: 0.9 mg/dL (ref 0.3–1.2)
BUN: 11 mg/dL (ref 6–20)
CALCIUM: 9.4 mg/dL (ref 8.9–10.3)
CO2: 22 mmol/L (ref 22–32)
Chloride: 107 mmol/L (ref 101–111)
Creatinine, Ser: 0.66 mg/dL (ref 0.44–1.00)
GFR calc Af Amer: >60 mL/min (ref >60)
GLUCOSE: 85 mg/dL (ref 65–99)
POTASSIUM: 4 mmol/L (ref 3.5–5.1)
Sodium: 137 mmol/L (ref 135–145)
TOTAL PROTEIN: 7.5 g/dL (ref 6.5–8.1)

## 2016-05-22 LAB — URINALYSIS, ROUTINE W REFLEX MICROSCOPIC
GLUCOSE, UA: NEGATIVE mg/dL
Hgb urine dipstick: NEGATIVE
KETONES UR: 40 mg/dL — AB
LEUKOCYTES UA: NEGATIVE
Nitrite: NEGATIVE
Protein, ur: NEGATIVE mg/dL
Specific Gravity, Urine: 1.031 — ABNORMAL HIGH (ref 1.005–1.030)
pH: 5.5 (ref 5.0–8.0)

## 2016-05-22 LAB — LIPASE, BLOOD: LIPASE: 23 U/L (ref 11–51)

## 2016-05-22 LAB — D-DIMER, QUANTITATIVE: D-Dimer, Quant: <0.27 ug/mL-FEU (ref 0.00–0.50)

## 2016-05-22 MED ORDER — OXYCODONE-ACETAMINOPHEN 5-325 MG PO TABS: 1.0000 | ORAL_TABLET | Freq: Once | ORAL | Status: DC

## 2016-05-22 MED ORDER — SODIUM CHLORIDE 0.9 % IV BOLUS (SEPSIS)
1000.0000 mL | Freq: Once | INTRAVENOUS | Status: AC
  Administered 2016-05-22: 1000 mL via INTRAVENOUS

## 2016-05-22 MED ORDER — KETOROLAC TROMETHAMINE 30 MG/ML IJ SOLN
30.0000 mg | Freq: Once | INTRAMUSCULAR | Status: AC
  Administered 2016-05-22: 30 mg via INTRAVENOUS
  Filled 2016-05-22: qty 1

## 2016-05-22 MED ORDER — DOXYCYCLINE HYCLATE 100 MG PO CAPS: 100.0000 mg | ORAL_CAPSULE | Freq: Two times a day (BID) | ORAL | Status: DC

## 2016-05-22 MED ORDER — DEXAMETHASONE SODIUM PHOSPHATE 10 MG/ML IJ SOLN
10.0000 mg | Freq: Once | INTRAMUSCULAR | Status: AC
  Administered 2016-05-22: 10 mg via INTRAVENOUS
  Filled 2016-05-22: qty 1

## 2016-05-22 MED ORDER — ONDANSETRON HCL 4 MG/2ML IJ SOLN: 4.0000 mg | Freq: Once | INTRAMUSCULAR | Status: DC

## 2016-05-22 MED ORDER — ACETAMINOPHEN 325 MG PO TABS: 650.0000 mg | ORAL_TABLET | Freq: Once | ORAL | Status: DC

## 2016-05-22 NOTE — ED Notes (Signed)
Pt states that she is ShOB, dehydrated and lethargic.  She states that she has PNA but the "[facility] [place] [facility] [place] [facility]'t".

## 2016-05-22 NOTE — ED Notes (Addendum)
Made 3rd request for urine,pt not quite ready to provide one.

## 2016-05-22 NOTE — ED Provider Notes (Signed)
CSN: [id number]     Arrival date & time 05/22/16  1000 History   First MD Initiated Contact with Patient 05/22/16 1142     Chief Complaint  Patient presents with  . Shortness of Breath  . Fatigue     (Consider location/radiation/quality/duration/timing/severity/associated sxs/prior Treatment) HPI Comments: Michele Lucas is a [age] y.o. female with history of COPD, fibromyalgia, neuropathic pain, chronic back pain, raynaud's, and anxiety presents to ED with complaint of dehydration, fatigue, and shortness of breath. Patient states symptoms have persisted for approximately one month. She has associated productive cough with blood in sputum. She endorses "crackling in chest" with breathing.  Other associated symptoms include chills, night sweats, nasal congestion, decrease appetite, and palpitations. She denies chest pain. No lower extremity swelling at this time, although she endorses a history of intermittent b/l lower extremity swelling. She denies any recent long distance travel/surgery/hospitalizations, no h/o blood clots, and no h/o cancer or cancer treatment in the last six months. She uses albuterol and symbicort inhalers for her shortness of breath. Denies abdominal complaints. No urinary complaints. No headache, dizziness, or lightheadedness. She has b/l weakness in her feet she attributes to her raynaud's. Patient also states she is out of her chronic pain medication. 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   The history is provided by the patient.    Past Medical History  Diagnosis Date  . Endometriosis   . PTSD (post-traumatic stress disorder)   . Neuropathic pain   . Chronic back pain   . Fibromyalgia   . COPD, mild (HCC)   . Anxiety    Past Surgical History  Procedure Laterality Date  . Tubal ligation    . Copolscopy    . Tonsillectomy    . Cesarean section     Family History  Problem Relation Age of  Onset  . Diabetes type II Neg Hx   . Stroke Neg Hx    Social History  Substance Use Topics  . Smoking status: Current Every Day Smoker -- 1.00 packs/day    Types: Cigarettes  . Smokeless tobacco: None  . Alcohol Use: No     Comment: denies   OB History    No data available     Review of Systems  Constitutional: Positive for chills, diaphoresis and fatigue ( generalized). Negative for fever.  HENT: Positive for congestion.   Respiratory: Positive for cough and shortness of breath.   Cardiovascular: Positive for palpitations and leg swelling ( intermittent, currently none).  Neurological: Positive for weakness ( b/l lower extremity, secondary to Raynauds).  All other systems reviewed and are negative.     Allergies  Guaifenesin & derivatives; Ibuprofen; Tylenol; Ampicillin; and Clindamycin/lincomycin  Home Medications   Prior to Admission medications   Medication Sig Start Date End Date Taking? Authorizing Provider  budesonide-formoterol (SYMBICORT) 80-4.5 MCG/ACT inhaler Inhale 2 puffs into the lungs 2 (two) times daily as needed (shortness of breath).    Yes Historical Provider, MD  fluticasone (FLONASE) 50 MCG/ACT nasal spray Place 2 sprays into the nose daily as needed for allergies.    Yes Historical Provider, MD  medroxyPROGESTERone (DEPO-PROVERA) 150 MG/ML injection Inject 150 mg into the muscle every 3 (three) months.   Yes Historical Provider, MD  morphine (MS CONTIN) 60 MG 12 hr tablet Take 60 mg by mouth every 12 (twelve) hours.    Yes Historical Provider, MD  oxyCODONE (OXY  IR/ROXICODONE) 5 MG immediate release tablet Take 15 mg by mouth 3 (three) times daily. Pain   Yes Historical Provider, MD  doxycycline (VIBRAMYCIN) 100 MG capsule Take 1 capsule (100 mg total) by mouth 2 (two) times daily. 05/22/16   Lona Kettle, PA-C   BP 154/68 mmHg  Pulse 82  Temp(Src) 99.8 F (37.7 C) (Oral)  Resp 16  Ht 5\' 2"  (1.575 m)  Wt 45.178 kg  BMI 18.21 kg/m2  SpO2  97% Physical Exam  Constitutional: She appears well-developed. She appears cachectic. She appears ill.  HENT:  Head: Normocephalic and atraumatic.  Mouth/Throat: Uvula is midline, oropharynx is clear and moist and mucous membranes are normal. No trismus in the jaw. No uvula swelling. No oropharyngeal exudate, posterior oropharyngeal edema, posterior oropharyngeal erythema or tonsillar abscesses.  Eyes: Conjunctivae and EOM are normal. Pupils are equal, round, and reactive to light. Right eye exhibits no discharge. Left eye exhibits no discharge. No scleral icterus.  Neck: Normal range of motion. Neck supple.  Cardiovascular: Normal rate, regular rhythm, normal heart sounds and intact distal pulses.   No murmur heard. Pulmonary/Chest: Effort normal. No respiratory distress. She has decreased breath sounds (throughout ). She has no wheezes. She has no rhonchi. She has no rales.  Abdominal: Soft. Bowel sounds are normal. There is tenderness ( diffuse mild). There is no rebound and no guarding.  Musculoskeletal: Normal range of motion.  Lymphadenopathy:    She has no cervical adenopathy.  Neurological: She is alert. Coordination normal.  Skin: Skin is warm and dry. She is not diaphoretic.  Psychiatric: She has a normal mood and affect.    ED Course  Procedures (including critical care time) Labs Review Labs Reviewed  COMPREHENSIVE METABOLIC PANEL - Abnormal; Notable for the following:    ALT 10 (*)    All other components within normal limits  URINALYSIS, ROUTINE W REFLEX MICROSCOPIC (NOT AT [facility]) - Abnormal; Notable for the following:    Color, Urine AMBER (*)    Specific Gravity, Urine 1.031 (*)    Bilirubin Urine SMALL (*)    Ketones, ur 40 (*)    All other components within normal limits  CBC WITH DIFFERENTIAL/PLATELET  LIPASE, BLOOD  D-DIMER, QUANTITATIVE (NOT AT [facility])    Imaging Review Dg Chest 2 View  05/22/2016  CLINICAL DATA:  Increased weakness, and lethargy. EXAM: CHEST   2 VIEW COMPARISON:  04/01/2015. FINDINGS: The heart size and mediastinal contours are within normal limits. Both lungs are clear but hyperinflated. The visualized skeletal structures are unremarkable. Improved aeration compared with priors. IMPRESSION: COPD, no active disease. Electronically Signed   By: Elsie Stain M.D.   On: 05/22/2016 12:01   I have personally reviewed and evaluated these images and lab results as part of my medical decision-making.   EKG Interpretation   Date/Time:  Wednesday May 22 2016 12:01:58 EDT Ventricular Rate:  89 PR Interval:  116 QRS Duration: 70 QT Interval:  410 QTC Calculation: 499 R Axis:   77 Text Interpretation:  Sinus rhythm Borderline short PR interval Right  atrial enlargement Left ventricular hypertrophy Borderline prolonged QT  interval since last tracing no significant change Confirmed by Effie Shy  MD,  ELLIOTT ([id number]) on 05/22/2016 3:52:58 PM      MDM   Final diagnoses:  Cough  Other fatigue  Shortness of breath    Patient is afebrile and ill appearing. She appears older than stated age. She is tachycardic and BP 110/5mmHg. She had mildly diffuse  abdominal tenderness and decreased breath sounds throughout on physical exam. CXR negative for acute pneumonia, PTX, or pleural effusion. Well's score 2.5. D-dimer <0.27, doubt PE. EKG shows not significant change from previous tracing. Lipase normal - doubt pancreatitis. CMP re-assuring, doubt acute cholecystitis. CBC re-assuring. U/A remarkable for dehydration - patient received 2L IVF. IV steroids in ED and pain medication.   ?URI vs. ?COPD exacerbation. Pressure improved and no longer tachycardic. Discussed results with patient and daughter. Given history of COPD, will treat with ABX for possible exacerbation - Rx ABX. Continue budesonide and albuterol inhaler PRN for relief of shortness of breath or wheezing. Symptomatic management for nasal congestion - zyrtec, allegra, or claritin + flonase.   Encouraged follow up with PCP within one week. Return precautions provided. Patient voiced understanding and is agreeable.    Lona Kettle, PA-C 05/22/16 2008  Mancel Bale, MD 05/23/16 2051

## 2016-05-22 NOTE — Discharge Instructions (Signed)
Read the information below.   Your urine showed some dehydration - we gave you two liters of fluid. You were also give steroids in the ED to help reduce inflammation.   Your labs were re-assuring. Your chest x-ray was negative for pneumonia; however, given history of COPD you are being treated for possible pneumonia.  Continue to take your inhalers as prescribed and as needed for shortness of breath.  Be sure to drink plenty of fluids.  Use the prescribed medication as directed.  Please discuss all new medications with your pharmacist.   Be sure to follow up with your PCP in the next week for re-evaluation following ED visit.  You may return to the Emergency Department at any time for worsening condition or any new symptoms that concern you. Return to ED if you develop fever, shortness of breath, chest pain, new neurologic symptoms, or loss of consciousness.      Fatigue Fatigue is feeling tired all of the time, a lack of energy, or a lack of motivation. Occasional or mild fatigue is often a normal response to activity or life in general. However, long-lasting (chronic) or extreme fatigue may indicate an underlying medical condition. HOME CARE INSTRUCTIONS  Watch your fatigue for any changes. The following actions may help to lessen any discomfort you are feeling:  Talk to your health care provider about how much sleep you need each night. Try to get the required amount every night.  Take medicines only as directed by your health care provider.  Eat a healthy and nutritious diet. Ask your health care provider if you need help changing your diet.  Drink enough fluid to keep your urine clear or pale yellow.  Practice ways of relaxing, such as yoga, meditation, massage therapy, or acupuncture.  Exercise regularly.   Change situations that cause you stress. Try to keep your work and personal routine reasonable.  Do not abuse illegal drugs.  Limit alcohol intake to no more than 1 drink  per day for nonpregnant women and 2 drinks per day for men. One drink equals 12 ounces of beer, 5 ounces of wine, or 1 ounces of hard liquor.  Take a multivitamin, if directed by your health care provider. SEEK MEDICAL CARE IF:   Your fatigue does not get better.  You have a fever.   You have unintentional weight loss or gain.  You have headaches.   You have difficulty:   Falling asleep.  Sleeping throughout the night.  You feel angry, guilty, anxious, or sad.   You are unable to have a bowel movement (constipation).   You skin is dry.   Your legs or another part of your body is swollen.  SEEK IMMEDIATE MEDICAL CARE IF:   You feel confused.   Your vision is blurry.  You feel faint or pass out.   You have a severe headache.   You have severe abdominal, pelvic, or back pain.   You have chest pain, shortness of breath, or an irregular or fast heartbeat.   You are unable to urinate or you urinate less than normal.   You develop abnormal bleeding, such as bleeding from the rectum, vagina, nose, lungs, or nipples.  You vomit blood.   You have thoughts about harming yourself or committing suicide.   You are worried that you might harm someone else.    This information is not intended to replace advice given to you by your health care provider. Make sure you discuss any questions you  have with your health care provider.   Document Released: 09/22/2007 Document Revised: 12/16/2014 Document Reviewed: 03/29/2014 Elsevier Interactive Patient Education 2016 [employer]  Shortness of Breath Shortness of breath means you have trouble breathing. Shortness of breath needs medical care right away. HOME CARE   Do not smoke.  Avoid being around chemicals or things (paint fumes, dust) that may bother your breathing.  Rest as needed. Slowly begin your normal activities.  Only take medicines as told by your doctor.  Keep all doctor visits as  told. GET HELP RIGHT AWAY IF:   Your shortness of breath gets worse.  You feel lightheaded, pass out (faint), or have a cough that is not helped by medicine.  You cough up blood.  You have pain with breathing.  You have pain in your chest, arms, shoulders, or belly (abdomen).  You have a fever.  You cannot walk up stairs or exercise the way you normally do.  You do not get better in the time expected.  You have a hard time doing normal activities even with rest.  You have problems with your medicines.  You have any new symptoms. MAKE SURE YOU:  Understand these instructions.  Will watch your condition.  Will get help right away if you are not doing well or get worse.   This information is not intended to replace advice given to you by your health care provider. Make sure you discuss any questions you have with your health care provider.   Document Released: 05/13/2008 Document Revised: 11/30/2013 Document Reviewed: 02/10/2012 Elsevier Interactive Patient Education 2016 [employer]   [employer] Resource Guide Financial Assistance The United Ways 211 is a great source of information about community services available.  Access by dialing 2-1-1 from anywhere in [place], or by website -  PooledIncome.plwww.nc211.org.   Other Local Resources (Updated 12/2015)  Financial Assistance   Services    Phone Number and Address  [facility] Low-cost medical care - 1st and 3rd Saturday of every month Must not qualify for public or private insurance and must have limited income [phone] [address] [place], [place]    [place] [employer] [place] Department of Social Services Child care Emergency assistance for housing and [employer] Food stamps Medicaid [phone] [address] [place], [place] [address]   [facility] Low-cost medical care for children, communicable diseases, sexually-transmitted diseases, immunizations, maternity  care, womens health and family planning [phone] [place], [place] [address]  [facility]  Medication assistance for [place] [facility] [facility] [place] residents Must meet income requirements [phone] [address] [place], [place].    [facility] Social Services Child care Emergency assistance for housing and [employer] Food stamps Medicaid [phone] [address] [place], [place] [address]  [facility]  Low-cost medical care,  Monday through Friday, 9 am to 6 pm.   Accepts Medicare/Medicaid, and self-pay [phone] [address] [place], [place] [address]  [facility] Low-cost medical care - Monday through Friday, 8:30 am - 5:30 pm Accepts Medicaid and self-pay [phone] [address] [place], [place] [address]   [facility] Primary medical care, including for those with sickle cell disease Accepts Medicare, Medicaid, insurance and self-pay [phone] [address] [place], [place]  [facility]  Primary medical care Accepts Medicare, [place], insurance and self-pay [phone], [address] [place] [place], [place] [address]   [place] [facility]  [employer] of Social Services Child care Emergency assistance for housing and [employer] Medicaid [phone] [address] [place], [place] [address]  [facility] Department of Health and [employer] Child care Emergency assistance for housing and [employer] Medicaid [phone] [address] [place], [place] [address]   [facility] Medication Assistance Program Medication assistance for [facility] residents with no insurance only Must have a primary care doctor [phone] [address] [place], [place]  [facility]  Primary medical care [place], [place],  insurance  [phone] [place], [place]  Med[facility]ist  Medication assistance [phone]  [facility]  Primary medical care Accepts Medicare, [place], insurance and self-pay [phone] [address] [place], [place] [address]  [facility]  Primary medical care Accepts Medicare, [place], insurance and self-pay [phone] [address] [place], [place] [address]  [facility] [place] residents between the ages of 32 and 44 who do not have any form of health insurance, Medicare, [place], or [place] benefits.  Services are provided free of charge to uninsured patients who fall within federal poverty guidelines.   Hours: Tuesdays and Thursdays, 4:15 - 8 pm [phone] [address] [place], [place] [address]  [facility]    Primary medical care Dental care Nutritional counseling Pharmacy Accepts Medicaid, Medicare, most insurance. Fees are adjusted based on ability to pay.   [phone] [facility] [address] [place], [place]  [phone] [facility] [address] [place], [place]  [phone] [facility] [place], [place]  [phone] [facility], [address] [place], [place]  [phone] [facility] [address] [place], [place]  [facility] Womens health and family planning [phone] [address] [place], [place]  [facility] Department of Social Services Child care Emergency assistance for housing and [employer] Medicaid [phone] [address], [place], [place] [address]   [facility]   Ages 10 and older Hours: Mondays and Thursdays, 7:00 am - 9:00 am Patients are seen on a first come, first served basis. [phone], [phone] [place], [place]  [facility] Division of Social Services Child care Emergency assistance for housing and [employer] Medicaid [phone] [address] [place], [place] [address]  [facility] Medication assistance Rental assistance Food pantry Medication assistance Housing assistance Emergency food distribution Utility assistance [phone] [address] [place], [place]  [phone]  [address] [place], [place] [address] Hours: Tuesdays and Thursdays from 9am - 12 noon by appointment only  [phone] [address] [place], [place] [address]  [facility] Lanae Boast  Accepts private insurance, [place], and [place]. Payment is based on a sliding scale for those without insurance. Hours: Mondays, Tuesdays and Thursdays, 8:30 am - 5:30 pm.   [phone] [address] [place], [place]  [facility] - Family Medicine at [facility], [place], and [place]. Payment is based on a sliding scale for those without insurance. [phone] [address] [place], [place]  [facility] - Pediatrics at E. Control and instrumentation engineer, [employer], and [place]. Payment is based on a sliding scale for those without insurance [phone] [address], [place], [place]  [facility] - Pediatrics at [employer]  private insurance, Medicare, and Medicaid. Payment is based on a sliding scale for those without insurance. [phone] [address] [place], [place]  [facility] - Pediatrics at [facility], [place], and [place]. Payment is based on a sliding scale for those without insurance. [phone], [phone] [place], [place].    [facility] Maternity care. Accepts Medicaid and self-pay. [phone] [address] [place], [place]

## 2016-05-22 NOTE — ED Notes (Signed)
Made 2nd request for urine,pt unable to provide on at this time.

## 2016-05-22 NOTE — ED Notes (Signed)
RN advised she would collect blood samples when she started the IV.

## 2016-05-22 NOTE — ED Notes (Addendum)
Made 1st request for urine,pt unable to provide one at this time 

## 2016-05-22 NOTE — ED Provider Notes (Signed)
  Face-to-face evaluation   History: She presents for evaluation of cough productive of sputum for several days. Chills with generalized achiness. He denies fever, nausea or vomiting.  Physical exam: Frail, elderly-appearing individual, in no respiratory distress. Clear drainage from left eye. No conjunctival injection. Heart regular rate and rhythm, no murmur. Lungs with diminished bilaterally without wheezes, Rales or rhonchi    EKG Interpretation  Date/Time:  Wednesday May 22 2016 12:01:58 EDT Ventricular Rate:  89 PR Interval:  116 QRS Duration: 70 QT Interval:  410 QTC Calculation: 499 R Axis:   77 Text Interpretation:  Sinus rhythm Borderline short PR interval Right atrial enlargement Left ventricular hypertrophy Borderline prolonged QT interval since last tracing no significant change Confirmed by Effie ShyWENTZ  MD, Mechele CollinELLIOTT ([id number]) on 05/22/2016 3:52:58 PM        Medical screening examination/treatment/procedure(s) were conducted as a shared visit with non-physician practitioner(s) and myself.  I personally evaluated the patient during the encounter  Mancel BaleElliott Fritzi Scripter, MD 05/23/16 2051

## 2016-10-06 ENCOUNTER — Inpatient Hospital Stay (HOSPITAL_COMMUNITY)
Admission: EM | Admit: 2016-10-06 | Discharge: 2016-10-13 | DRG: 200 | Disposition: A | Discharge status: Home Health | Attending: Internal Medicine | Admitting: Internal Medicine

## 2016-10-06 ENCOUNTER — Emergency Department (HOSPITAL_COMMUNITY)

## 2016-10-06 ENCOUNTER — Encounter (HOSPITAL_COMMUNITY): Payer: Self-pay | Admitting: Emergency Medicine

## 2016-10-06 DIAGNOSIS — N809 Endometriosis, unspecified: Secondary | ICD-10-CM | POA: Diagnosis not present

## 2016-10-06 DIAGNOSIS — M797 Fibromyalgia: Secondary | ICD-10-CM | POA: Diagnosis not present

## 2016-10-06 DIAGNOSIS — Z881 Allergy status to other antibiotic agents status: Secondary | ICD-10-CM | POA: Diagnosis not present

## 2016-10-06 DIAGNOSIS — R042 Hemoptysis: Secondary | ICD-10-CM | POA: Diagnosis not present

## 2016-10-06 DIAGNOSIS — Z886 Allergy status to analgesic agent status: Secondary | ICD-10-CM | POA: Diagnosis not present

## 2016-10-06 DIAGNOSIS — Z09 Encounter for follow-up examination after completed treatment for conditions other than malignant neoplasm: Secondary | ICD-10-CM

## 2016-10-06 DIAGNOSIS — K5900 Constipation, unspecified: Secondary | ICD-10-CM | POA: Diagnosis not present

## 2016-10-06 DIAGNOSIS — F1123 Opioid dependence with withdrawal: Secondary | ICD-10-CM | POA: Diagnosis present

## 2016-10-06 DIAGNOSIS — F17210 Nicotine dependence, cigarettes, uncomplicated: Secondary | ICD-10-CM | POA: Diagnosis not present

## 2016-10-06 DIAGNOSIS — J9311 Primary spontaneous pneumothorax: Secondary | ICD-10-CM | POA: Diagnosis not present

## 2016-10-06 DIAGNOSIS — R079 Chest pain, unspecified: Secondary | ICD-10-CM | POA: Diagnosis present

## 2016-10-06 DIAGNOSIS — F4310 Post-traumatic stress disorder, unspecified: Secondary | ICD-10-CM | POA: Diagnosis present

## 2016-10-06 DIAGNOSIS — G894 Chronic pain syndrome: Secondary | ICD-10-CM | POA: Diagnosis present

## 2016-10-06 DIAGNOSIS — I7300 Raynaud's syndrome without gangrene: Secondary | ICD-10-CM | POA: Diagnosis present

## 2016-10-06 DIAGNOSIS — J449 Chronic obstructive pulmonary disease, unspecified: Secondary | ICD-10-CM | POA: Diagnosis not present

## 2016-10-06 DIAGNOSIS — M549 Dorsalgia, unspecified: Secondary | ICD-10-CM | POA: Diagnosis not present

## 2016-10-06 DIAGNOSIS — Z7951 Long term (current) use of inhaled steroids: Secondary | ICD-10-CM | POA: Diagnosis not present

## 2016-10-06 DIAGNOSIS — Z9689 Presence of other specified functional implants: Secondary | ICD-10-CM

## 2016-10-06 DIAGNOSIS — F419 Anxiety disorder, unspecified: Secondary | ICD-10-CM | POA: Diagnosis present

## 2016-10-06 DIAGNOSIS — J9383 Other pneumothorax: Principal | ICD-10-CM | POA: Diagnosis present

## 2016-10-06 DIAGNOSIS — Z9889 Other specified postprocedural states: Secondary | ICD-10-CM

## 2016-10-06 DIAGNOSIS — J939 Pneumothorax, unspecified: Secondary | ICD-10-CM | POA: Diagnosis present

## 2016-10-06 DIAGNOSIS — D62 Acute posthemorrhagic anemia: Secondary | ICD-10-CM | POA: Diagnosis not present

## 2016-10-06 LAB — BASIC METABOLIC PANEL
Anion gap: 13 (ref 5–15)
BUN: 30 mg/dL — AB (ref 6–20)
CALCIUM: 10.7 mg/dL — AB (ref 8.9–10.3)
CO2: 19 mmol/L — AB (ref 22–32)
CREATININE: 0.96 mg/dL (ref 0.44–1.00)
Chloride: 102 mmol/L (ref 101–111)
GFR calc Af Amer: >60 mL/min (ref >60)
GFR calc non Af Amer: >60 mL/min (ref >60)
GLUCOSE: 146 mg/dL — AB (ref 65–99)
Potassium: 4.7 mmol/L (ref 3.5–5.1)
Sodium: 134 mmol/L — ABNORMAL LOW (ref 135–145)

## 2016-10-06 LAB — I-STAT TROPONIN, ED: Troponin i, poc: 0.02 ng/mL (ref 0.00–0.08)

## 2016-10-06 LAB — CBC WITH DIFFERENTIAL/PLATELET
Basophils Absolute: 0 10*3/uL (ref 0.0–0.1)
Basophils Relative: 0 %
EOS PCT: 0 %
Eosinophils Absolute: 0 10*3/uL (ref 0.0–0.7)
HCT: 46.2 % — ABNORMAL HIGH (ref 36.0–46.0)
Hemoglobin: 15.8 g/dL — ABNORMAL HIGH (ref 12.0–15.0)
LYMPHS ABS: 1 10*3/uL (ref 0.7–4.0)
LYMPHS PCT: 6 %
MCH: 32 pg (ref 26.0–34.0)
MCHC: 34.2 g/dL (ref 30.0–36.0)
MCV: 93.5 fL (ref 78.0–100.0)
MONO ABS: 1.7 10*3/uL — AB (ref 0.1–1.0)
Monocytes Relative: 10 %
Neutro Abs: 13.7 10*3/uL — ABNORMAL HIGH (ref 1.7–7.7)
Neutrophils Relative %: 84 %
PLATELETS: 267 10*3/uL (ref 150–400)
RBC: 4.94 MIL/uL (ref 3.87–5.11)
RDW: 14.1 % (ref 11.5–15.5)
WBC: 16.5 10*3/uL — ABNORMAL HIGH (ref 4.0–10.5)

## 2016-10-06 LAB — BRAIN NATRIURETIC PEPTIDE: B Natriuretic Peptide: 29.5 pg/mL (ref 0.0–100.0)

## 2016-10-06 MED ORDER — CLONIDINE HCL 0.1 MG PO TABS
0.1000 mg | ORAL_TABLET | Freq: Once | ORAL | Status: DC
  Filled 2016-10-06: qty 1

## 2016-10-06 MED ORDER — FENTANYL CITRATE (PF) 100 MCG/2ML IJ SOLN
50.0000 ug | Freq: Once | INTRAMUSCULAR | Status: AC
  Administered 2016-10-06: 50 ug via INTRAVENOUS
  Filled 2016-10-06: qty 2

## 2016-10-06 MED ORDER — FENTANYL CITRATE (PF) 100 MCG/2ML IJ SOLN
50.0000 ug | INTRAMUSCULAR | Status: AC | PRN
  Administered 2016-10-06 – 2016-10-07 (×3): 50 ug via INTRAVENOUS
  Filled 2016-10-06 (×3): qty 2

## 2016-10-06 MED ORDER — SODIUM CHLORIDE 0.9 % IV BOLUS (SEPSIS)
1000.0000 mL | Freq: Once | INTRAVENOUS | Status: AC
  Administered 2016-10-06: 1000 mL via INTRAVENOUS

## 2016-10-06 MED ORDER — HYDROMORPHONE HCL 2 MG/ML IJ SOLN
1.0000 mg | Freq: Once | INTRAMUSCULAR | Status: AC
  Administered 2016-10-06: 1 mg via INTRAVENOUS
  Filled 2016-10-06: qty 1

## 2016-10-06 NOTE — ED Notes (Signed)
Daughter Lana FishBrionna would like to be called when patient is admitted to room [phone].

## 2016-10-06 NOTE — ED Provider Notes (Signed)
[facility] DEPT Provider Note   CSN: [id number] Arrival date & time: 10/06/16  2102     History   Chief Complaint No chief complaint on file.   HPI Michele Lucas is a 50 y.o. female.  The history is provided by the patient.  Chest Pain   This is a new problem. The current episode started 12 to 24 hours ago. The problem occurs constantly. The problem has been gradually worsening. The pain is associated with rest. The pain is present in the lateral region. The pain is moderate. The quality of the pain is described as sharp. The pain does not radiate. Duration of episode(s) is 1 day. Associated symptoms include cough and shortness of breath. She has tried nothing for the symptoms. The treatment provided no relief. Risk factors: chronic opioid use with withdrawal after running out of medicatirons.  Her past medical history is significant for COPD. Past medical history comments: chronic pain syndrome    Past Medical History:  Diagnosis Date  . Anxiety   . Chronic back pain   . COPD, mild ([place])   . Endometriosis   . Fibromyalgia   . Neuropathic pain   . PTSD (post-traumatic stress disorder)     Patient Active Problem List   Diagnosis Date Noted  . Chronic pain syndrome 04/02/2015  . Generalized anxiety disorder 04/02/2015  . Hypokalemia 04/02/2015  . Normocytic anemia 04/02/2015  . Abnormal urinalysis 04/02/2015  . SOB (shortness of breath)   . CAP (community acquired pneumonia) 04/01/2015  . COPD (chronic obstructive pulmonary disease) ([place]) 04/01/2015    Past Surgical History:  Procedure Laterality Date  . CESAREAN SECTION    . copolscopy    . TONSILLECTOMY    . TUBAL LIGATION      OB History    No data available       Home Medications    Prior to Admission medications   Medication Sig Start Date End Date Taking? Authorizing Provider  budesonide-formoterol (SYMBICORT) 80-4.5 MCG/ACT inhaler Inhale 2 puffs into the lungs 2 (two) times daily as needed  (shortness of breath).     Historical Provider, MD  doxycycline (VIBRAMYCIN) 100 MG capsule Take 1 capsule (100 mg total) by mouth 2 (two) times daily. 05/22/16   Roxanna Mew, PA-C  fluticasone [facility]) 50 MCG/ACT nasal spray Place 2 sprays into the nose daily as needed for allergies.     Historical Provider, MD  medroxyPROGESTERone (DEPO-PROVERA) 150 MG/ML injection Inject 150 mg into the muscle every 3 (three) months.    Historical Provider, MD  morphine (MS CONTIN) 60 MG 12 hr tablet Take 60 mg by mouth every 12 (twelve) hours.     Historical Provider, MD  oxyCODONE (OXY IR/ROXICODONE) 5 MG immediate release tablet Take 15 mg by mouth 3 (three) times daily. Pain    Historical Provider, MD    Family History Family History  Problem Relation Age of Onset  . Diabetes type II Neg Hx   . Stroke Neg Hx     Social History Social History  Substance Use Topics  . Smoking status: Current Every Day Smoker    Packs/day: 1.00    Types: Cigarettes  . Smokeless tobacco: Not on file  . Alcohol use No     Comment: denies     Allergies   Guaifenesin & derivatives; Ibuprofen; Tylenol [acetaminophen]; Ampicillin; and Clindamycin/lincomycin   Review of Systems Review of Systems  Respiratory: Positive for cough and shortness of breath.   Cardiovascular: Positive for  chest pain.  All other systems reviewed and are negative.    Physical Exam Updated Vital Signs BP 145/87 (BP Location: Right Arm)   Pulse 115   Temp 98.2 F (36.8 C) (Oral)   Resp (!) 35   SpO2 (!) 89%   Physical Exam  Constitutional: She is oriented to person, place, and time. She appears well-developed and well-nourished. She appears distressed (uncomfortable d/t pain).  HENT:  Head: Normocephalic.  Nose: Nose normal.  Eyes: Conjunctivae are normal.  Neck: Neck supple. No tracheal deviation present.  Cardiovascular: Regular rhythm and normal heart sounds.  Tachycardia present.   Pulmonary/Chest: Effort  normal. Tachypnea noted. No respiratory distress. She has decreased breath sounds (diffusely worse on left ).  Abdominal: Soft. She exhibits no distension.  Neurological: She is alert and oriented to person, place, and time.  Skin: Skin is warm and dry.  Psychiatric: She has a normal mood and affect.     ED Treatments / Results  Labs (all labs ordered are listed, but only abnormal results are displayed) Labs Reviewed  BASIC METABOLIC PANEL - Abnormal; Notable for the following:       Result Value   Sodium 134 (*)    CO2 19 (*)    Glucose, Bld 146 (*)    BUN 30 (*)    Calcium 10.7 (*)    All other components within normal limits  CBC WITH DIFFERENTIAL/PLATELET - Abnormal; Notable for the following:    WBC 16.5 (*)    Hemoglobin 15.8 (*)    HCT 46.2 (*)    Neutro Abs 13.7 (*)    Monocytes Absolute 1.7 (*)    All other components within normal limits  BRAIN NATRIURETIC PEPTIDE  URINALYSIS, ROUTINE W REFLEX MICROSCOPIC (NOT AT [facility])  BASIC METABOLIC PANEL  I-STAT TROPOININ, ED    EKG  EKG Interpretation  Date/Time:  Sunday October 06 2016 21:06:04 EDT Ventricular Rate:  117 PR Interval:    QRS Duration: 67 QT Interval:  395 QTC Calculation: 552 R Axis:   93 Text Interpretation:  Sinus tachycardia Ventricular premature complex Biatrial enlargement Borderline right axis deviation Probable left ventricular hypertrophy Nonspecific T abnormalities, diffuse leads Prolonged QT interval Artifact in lead(s) V4 V5 V6 Since last tracing rate faster Interpretation limited secondary to artifact Confirmed by Alahia Whicker MD, Venetta Knee [phone]) on 10/07/2016 1:21:09 AM       Radiology Dg Chest Port 1 View  Result Date: 10/06/2016 CLINICAL DATA:  Left pneumothorax EXAM: PORTABLE CHEST 1 VIEW COMPARISON:  10/06/2016 at 21:25 FINDINGS: A left chest tube has been placed with reduction of the left pneumothorax. A moderate left pleural air collection remains in the medial, apical and lateral base  regions. The right lung remains clear. Pulmonary vasculature is normal. IMPRESSION: Moderate residual left pneumothorax although it is substantially reduced after chest tube placement. Electronically Signed   By: Andreas Newport M.D.   On: 10/06/2016 23:18   Dg Chest Port 1 View  Result Date: 10/06/2016 CLINICAL DATA:  Acute onset of left-sided chest pain and tenderness. Initial encounter. EXAM: PORTABLE CHEST 1 VIEW COMPARISON:  Chest radiograph performed 05/22/2016 FINDINGS: There is a large left-sided pneumothorax, with partial collapse of the left lung. No definite tension is seen. The right lung appears clear. No pleural effusion is identified. The cardiomediastinal silhouette remains normal in size. No acute osseous abnormalities are seen. IMPRESSION: Large left-sided pneumothorax, with partial collapse of the left lung. No evidence of tension at this time. Critical Value/emergent results  were called by telephone at the time of interpretation on 10/06/2016 at 9:40 pm to Dr. Leo Grosser, who verbally acknowledged these results. Electronically Signed   By: Garald Balding M.D.   On: 10/06/2016 21:41    Procedures  CRITICAL CARE Performed by: Leo Grosser Total critical care time: 30 minutes Critical care time was exclusive of separately billable procedures and treating other patients. Critical care was necessary to treat or prevent imminent or life-threatening deterioration. Critical care was time spent personally by me on the following activities: development of treatment plan with patient and/or surrogate as well as nursing, discussions with consultants, evaluation of patient's response to treatment, examination of patient, obtaining history from patient or surrogate, ordering and performing treatments and interventions, ordering and review of laboratory studies, ordering and review of radiographic studies, pulse oximetry and re-evaluation of patient's condition.  CHEST TUBE  INSERTION Date/Time: 10/07/2016 1:15 AM Performed by: Leo Grosser Authorized by: Leo Grosser   Consent:    Consent obtained:  Verbal and written   Consent given by:  Patient   Risks discussed:  Bleeding, infection, pain and damage to surrounding structures   Alternatives discussed:  No treatment Pre-procedure details:    Skin preparation:  ChloraPrep Sedation:    Sedation type:  Anxiolysis (and pain control with fentanyl and dilaudid) Anesthesia (see MAR for exact dosages):    Anesthesia method:  Local infiltration   Local anesthetic:  Lidocaine 2% WITH epi Procedure details:    Placement location:  L lateral   Scalpel size:  11   Tube size ([place]): 14 Fr.   Dissection instrument: percutaneous kit with wire guidance.   Tension pneumothorax: no     Tube connected to:  Suction   Drainage characteristics:  Air only   Suture material:  2-0 silk   Dressing:  Xeroform gauze Post-procedure details:    Post-insertion x-ray findings: tube in good position     Patient tolerance of procedure:  Tolerated well, no immediate complications   (including critical care time)  Medications Ordered in ED Medications  fentaNYL (SUBLIMAZE) injection 50 mcg (50 mcg Intravenous Given 10/07/16 0043)  0.9 %  sodium chloride infusion (not administered)  sodium chloride 0.9 % bolus 1,000 mL (1,000 mLs Intravenous New Bag/Given 10/06/16 2141)  HYDROmorphone (DILAUDID) injection 1 mg (1 mg Intravenous Given 10/06/16 2225)  fentaNYL (SUBLIMAZE) injection 50 mcg (50 mcg Intravenous Given 10/06/16 2306)     Initial Impression / Assessment and Plan / ED Course  I have reviewed the triage vital signs and the nursing notes.  Pertinent labs & imaging results that were available during my care of the patient were reviewed by me and considered in my medical decision making (see chart for details).  Clinical Course    50 y.o. female presents with left chest pain starting in left neck and spreading  down starting yesterday and worsening today. She is on high dose narcotic therapy for chronic pain and ran out of medicine 2 days ago which precipitated withdrawal with vomiting, diarrhea and sweatiness. She appears uncomfortable and is hypoxic with h/o COPD. XR with large left pneumothorax likely explaining symptoms. Possibly secondary to vomiting and blebs from COPD. Chest tube placed after discussion with CT surgery who recommended admission to medicine and will consult. Internal medicine was consulted for admission and will see the patient in the emergency department.   Final Clinical Impressions(s) / ED Diagnoses   Final diagnoses:  Chest pain  Pneumothorax on left    New  Prescriptions New Prescriptions   No medications on file     Leo Grosser, MD 10/07/16 [phone]

## 2016-10-06 NOTE — H&P (Signed)
Date: 10/06/2016               Patient Name:  Michele Lucas MRN: [id number]  DOB: December 28, 1965 Age / Sex: 50 y.o., female   PCP: No Pcp Per Patient         Medical Service: Internal Medicine Teaching Service         Attending Physician: Dr. Earl Lagos, MD    First Contact: Dr. Thomasene Lot Pager: [phone]  Second Contact: Dr. Valentino Nose Pager: [phone]       After Hours (After 5p/  First Contact Pager: [phone]  weekends / holidays): Second Contact Pager: [phone]   Chief Complaint: left sided chest pain  History of Present Illness: Michele Lucas is a 50 year old woman with history of COPD and chronic pain that presents to the ED with left sided chest pain that started tonight when trying to lay down.  She states the pain spontaneously started in her neck and migrated to the left side of her chest.  Prior to her chest pain she was having a three day history of coughing, vomiting, diarrhea and weakness.  She stated that she ran out of her pain medications which include Oxycodone and Morphine and states she was having withdrawal symptoms.  She has only had one previous episode of withdrawals in the past put states that her pain medication usually lasts for the month.  She reports that her husband has been in and out of the hospital recently.  She states she had to use more pain medication this month because of increased stress and physical activity related to her husband's health.  She reports taking Oxycodone 20mg  three times a day and morphine 60mg  twice a day.  She states she is seen by the [facility] for her medical conditions which include fibromyalgia, endometriosis, back pain and raynaud's syndrome.   Meds:  Current Meds  Medication Sig  . budesonide-formoterol (SYMBICORT) 80-4.5 MCG/ACT inhaler Inhale 2 puffs into the lungs 2 (two) times daily as needed (shortness of breath).   . citalopram (CELEXA) 40 MG tablet Take 40 mg by mouth daily.  . fluticasone (FLONASE) 50  MCG/ACT nasal spray Place 2 sprays into the nose daily as needed for allergies.   . medroxyPROGESTERone (DEPO-PROVERA) 150 MG/ML injection Inject 150 mg into the muscle every 3 (three) months.  . morphine (MS CONTIN) 60 MG 12 hr tablet Take 60 mg by mouth every 12 (twelve) hours.   [place] Kitchen oxyCODONE (OXY IR/ROXICODONE) 5 MG immediate release tablet Take 15 mg by mouth 3 (three) times daily. Pain     Allergies: Allergies as of 10/06/2016 - Review Complete 10/06/2016  Allergen Reaction Noted  . Guaifenesin & derivatives Shortness Of Breath 12/05/2012  . Ibuprofen  05/22/2016  . Tylenol [acetaminophen]  05/22/2016  . Ampicillin Hives and Rash 03/15/2012  . Clindamycin/lincomycin Swelling and Rash 12/05/2012   Past Medical History:  Diagnosis Date  . Anxiety   . Chronic back pain   . COPD, mild ([facility])   . Endometriosis   . Fibromyalgia   . Neuropathic pain   . PTSD (post-traumatic stress disorder)     Family History:  Patient states that she is not informed of any medical conditions in her family.  Social History: Tobacco use:  Smoked cigarettes since she was 41.  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  [place]    Review of Systems: A complete ROS was negative except as per HPI.   Physical Exam: Blood pressure 140/82, pulse 119, temperature 98.2 F (36.8 C), temperature source Oral, resp. rate 24, weight 99 lb (44.9 kg), SpO2 93 %. Vitals:   10/06/16 2123 10/06/16 2200 10/06/16 2230 10/06/16 2315  BP:  147/78 146/79 140/82  Pulse:  110 116 119  Resp:  24 26 24   Temp:      TempSrc:      SpO2:  97% 98% 93%  Weight: 99 lb (44.9 kg)      General: Vital signs reviewed.  Patient is thin, in  acute distress and unable to fully cooperative with exam, specifically pulmonary exam.  Head: Normocephalic and atraumatic. Eyes: EOMI, conjunctivae normal, no scleral icterus.  Neck:  Supple, trachea midline, normal ROM Cardiovascular: RRR, S1 normal, S2 normal, no murmurs, gallops, or rubs. Pulmonary/Chest: Poor inspiratory effort.  No wheezes, rhales or rhonic noted Abdominal: Soft, non-tender, non-distended, BS +, no masses, organomegaly, or guarding present.  Musculoskeletal: No joint deformities, erythema, or stiffness,tenderness to palpation of lower extremities bilaterally. Extremities: No lower extremity edema bilaterally,  pulses symmetric and intact bilaterally. No cyanosis or clubbing. Skin: Warm, dry and intact. No rashes or erythema. Psychiatric: Normal mood and affect. speech and behavior is normal. Cognition and memory are normal.   BMET    Component Value Date/Time   NA 134 (L) 10/06/2016 2114   K 4.7 10/06/2016 2114   CL 102 10/06/2016 2114   CO2 19 (L) 10/06/2016 2114   GLUCOSE 146 (H) 10/06/2016 2114   BUN 30 (H) 10/06/2016 2114   CREATININE 0.96 10/06/2016 2114   CALCIUM 10.7 (H) 10/06/2016 2114   GFRNONAA >60 10/06/2016 2114   GFRAA >60 10/06/2016 2114   CBC    Component Value Date/Time   WBC 16.5 (H) 10/06/2016 2114   RBC 4.94 10/06/2016 2114   HGB 15.8 (H) 10/06/2016 2114   HCT 46.2 (H) 10/06/2016 2114   PLT 267 10/06/2016 2114   MCV 93.5 10/06/2016 2114   MCH 32.0 10/06/2016 2114   MCHC 34.2 10/06/2016 2114   RDW 14.1 10/06/2016 2114   LYMPHSABS 1.0 10/06/2016 2114   MONOABS 1.7 (H) 10/06/2016 2114   EOSABS 0.0 10/06/2016 2114   BASOSABS 0.0 10/06/2016 2114     EKG: Sinus tachycardia with no ST elevations or t wave inversions.  Premature ventricular complexes and QT is prolonged.  CXR:  FINDINGS: There is a large left-sided pneumothorax, with partial collapse of the left lung. No definite tension is seen. The right lung appears clear. No pleural effusion is identified. IMPRESSION: Large left-sided pneumothorax, with partial collapse of the left lung. No evidence of tension at this time.  Assessment & Plan by  Problem: Active Problems: Spontaneous pneumothorax on left After several days of nausea and vomiting, attributed to opioid withdrawal the patient developed a spontaneous pneumothorax.  She denies any previous history of a pneumothorax.  Patient has an extensive smoking history and is the most significant risk factor for developing a spontaneous pneumothorax.  She is being managed with a chest tube and fentanyl for pain. - Chest tube - fentanyl   - IV zofran - telemetry  - cardiothoracic following, appreciate recommendations   Chronic Pain Patient is on morphine 60mg  ER BID and Oxycodone 5mg  three times daily.  She refilled her medication on 09/12/2016 and ran out of her medication on Friday 10/27.  She states she has been  taking her morphine as prescribed but has been taking an extra dose of oxycodone daily because of the increased stress and physical activity required to take care of her husband that has been in and out of the hospital and currently in a rehab center.  She has been having withdrawal symptoms for the past 3 days.  We will restart her home narcotic medication - Restart oxycodone and morphine  COPD Stable. On Symbicort.  [facility]. - Continue home medications  Raynaud syndrome [facility].  Those medical notes are not in EPIC. She states she has raynaud's syndrome and had a recent flare that only affect her feet.  She was tender on exam to her lower extremities bilaterally.  She is not taking any medication currently.  May consider a calcium channel blocker such as nifedipine or amlodipine. - Monitor     Anxiety Stable.  Patient takes celexa 40mg  daily - Continue celexa  Diet:  Heart, thin DVT prophylaxis: subq lovenox Code status:  Full code    Dispo: Admit patient to Inpatient with expected length of stay greater than 2 midnights.  Signed: Camelia PhenesJessica Ratliff Rheagan Nayak,  DO 10/06/2016, 11:38 PM  Pager: [phone]

## 2016-10-06 NOTE — ED Notes (Signed)
Nurse drawing labs. 

## 2016-10-06 NOTE — ED Triage Notes (Signed)
Patient presents to ED with Chest pain to left side of chest. Tender on palpation per EDP. Patient has been without chronic pain meds since Friday and states she feels like she's withdrawing. On arrival, patient on nonrebreather, 02 saturation at 92% with labored, shallow breathing. Patient alert and oriented x 4. 90% on room air, with said history of COPD. Yellow sclera noted.

## 2016-10-07 ENCOUNTER — Inpatient Hospital Stay (HOSPITAL_COMMUNITY)

## 2016-10-07 DIAGNOSIS — Z79891 Long term (current) use of opiate analgesic: Secondary | ICD-10-CM

## 2016-10-07 DIAGNOSIS — Z978 Presence of other specified devices: Secondary | ICD-10-CM

## 2016-10-07 DIAGNOSIS — J449 Chronic obstructive pulmonary disease, unspecified: Secondary | ICD-10-CM

## 2016-10-07 DIAGNOSIS — G894 Chronic pain syndrome: Secondary | ICD-10-CM

## 2016-10-07 DIAGNOSIS — Z79899 Other long term (current) drug therapy: Secondary | ICD-10-CM

## 2016-10-07 DIAGNOSIS — Z7951 Long term (current) use of inhaled steroids: Secondary | ICD-10-CM

## 2016-10-07 DIAGNOSIS — J9383 Other pneumothorax: Principal | ICD-10-CM

## 2016-10-07 DIAGNOSIS — I7300 Raynaud's syndrome without gangrene: Secondary | ICD-10-CM

## 2016-10-07 DIAGNOSIS — J9311 Primary spontaneous pneumothorax: Secondary | ICD-10-CM

## 2016-10-07 DIAGNOSIS — F17200 Nicotine dependence, unspecified, uncomplicated: Secondary | ICD-10-CM

## 2016-10-07 DIAGNOSIS — F419 Anxiety disorder, unspecified: Secondary | ICD-10-CM

## 2016-10-07 LAB — BASIC METABOLIC PANEL
ANION GAP: 8 (ref 5–15)
BUN: 21 mg/dL — ABNORMAL HIGH (ref 6–20)
CALCIUM: 9.5 mg/dL (ref 8.9–10.3)
CO2: 21 mmol/L — ABNORMAL LOW (ref 22–32)
Chloride: 107 mmol/L (ref 101–111)
Creatinine, Ser: 0.61 mg/dL (ref 0.44–1.00)
Glucose, Bld: 119 mg/dL — ABNORMAL HIGH (ref 65–99)
POTASSIUM: 3.7 mmol/L (ref 3.5–5.1)
SODIUM: 136 mmol/L (ref 135–145)

## 2016-10-07 LAB — MRSA PCR SCREENING: MRSA BY PCR: NEGATIVE

## 2016-10-07 MED ORDER — ENOXAPARIN SODIUM 30 MG/0.3ML ~~LOC~~ SOLN
20.0000 mg | SUBCUTANEOUS | Status: DC
  Administered 2016-10-07 – 2016-10-09 (×3): 20 mg via SUBCUTANEOUS
  Filled 2016-10-07 (×3): qty 0.3
  Filled 2016-10-07 (×2): qty 0.2
  Filled 2016-10-07: qty 0.3
  Filled 2016-10-07: qty 0.2

## 2016-10-07 MED ORDER — SODIUM CHLORIDE 0.9 % IV SOLN
INTRAVENOUS | Status: AC
  Administered 2016-10-07: 02:00:00 via INTRAVENOUS

## 2016-10-07 MED ORDER — ONDANSETRON HCL 4 MG PO TABS: 4.0000 mg | ORAL_TABLET | Freq: Four times a day (QID) | ORAL | Status: DC | PRN

## 2016-10-07 MED ORDER — ONDANSETRON HCL 4 MG/2ML IJ SOLN: 4.0000 mg | Freq: Four times a day (QID) | INTRAMUSCULAR | Status: DC | PRN

## 2016-10-07 MED ORDER — CITALOPRAM HYDROBROMIDE 20 MG PO TABS
40.0000 mg | ORAL_TABLET | Freq: Every day | ORAL | Status: DC
  Administered 2016-10-07 – 2016-10-13 (×7): 40 mg via ORAL
  Filled 2016-10-07 (×7): qty 2

## 2016-10-07 MED ORDER — ENOXAPARIN SODIUM 40 MG/0.4ML ~~LOC~~ SOLN: 40.0000 mg | SUBCUTANEOUS | Status: DC

## 2016-10-07 MED ORDER — OXYCODONE HCL 5 MG PO TABS
10.0000 mg | ORAL_TABLET | Freq: Three times a day (TID) | ORAL | Status: DC
  Administered 2016-10-07 – 2016-10-09 (×7): 10 mg via ORAL
  Filled 2016-10-07 (×7): qty 2

## 2016-10-07 MED ORDER — MORPHINE SULFATE ER 30 MG PO TBCR
60.0000 mg | EXTENDED_RELEASE_TABLET | Freq: Two times a day (BID) | ORAL | Status: DC
  Administered 2016-10-07 – 2016-10-09 (×6): 60 mg via ORAL
  Filled 2016-10-07 (×6): qty 2

## 2016-10-07 MED ORDER — SODIUM CHLORIDE 0.9% FLUSH
3.0000 mL | Freq: Two times a day (BID) | INTRAVENOUS | Status: DC
  Administered 2016-10-07 – 2016-10-12 (×12): 3 mL via INTRAVENOUS

## 2016-10-07 MED ORDER — MOMETASONE FURO-FORMOTEROL FUM 100-5 MCG/ACT IN AERO
2.0000 | INHALATION_SPRAY | Freq: Two times a day (BID) | RESPIRATORY_TRACT | Status: DC
  Administered 2016-10-07 – 2016-10-13 (×13): 2 via RESPIRATORY_TRACT
  Filled 2016-10-07 (×2): qty 8.8

## 2016-10-07 MED ORDER — SENNOSIDES-DOCUSATE SODIUM 8.6-50 MG PO TABS
2.0000 | ORAL_TABLET | Freq: Every day | ORAL | Status: DC
  Filled 2016-10-07 (×4): qty 2

## 2016-10-07 NOTE — Progress Notes (Addendum)
[address]       [place] [id number]             [phone]         Subjective:  Assisting with chest tube management. CXR done on H2O seal. - Now on suction Associates the pntx sx with cough and vomiting  Objective: Vital signs in last 24 hours: Temp:  [98.2 F (36.8 C)-99.4 F (37.4 C)] 99.4 F (37.4 C) (10/30 0719) Pulse Rate:  [92-120] 120 (10/30 0900) Cardiac Rhythm: Sinus tachycardia (10/30 0900) Resp:  [19-37] 23 (10/30 0900) BP: (127-152)/(71-88) 142/72 (10/30 0719) SpO2:  [86 %-99 %] 97 % (10/30 1012) FiO2 (%):  [4 %] 4 % (10/30 0236) Weight:  [93 lb 14.7 oz (42.6 kg)-99 lb (44.9 kg)] 93 lb 14.7 oz (42.6 kg) (10/30 0155)  Hemodynamic parameters for last 24 hours:    Intake/Output from previous day: 10/29 0701 - 10/30 0700 In: 100 [I.V.:100] Out: 200 [Urine:200] Intake/Output this shift: Total I/O In: 646.7 [I.V.:646.7] Out: 0   General appearance: alert, cooperative, appears older than stated age and distracted Heart: regular rate and rhythm Lungs: poor effort, sounds clear Abdomen: benign  Lab Results:  Recent Labs  10/06/16 2114  WBC 16.5*  HGB 15.8*  HCT 46.2*  PLT 267   BMET:  Recent Labs  10/06/16 2114 10/07/16 0405  NA 134* 136  K 4.7 3.7  CL 102 107  CO2 19* 21*  GLUCOSE 146* 119*  BUN 30* 21*  CREATININE 0.96 0.61  CALCIUM 10.7* 9.5    PT/INR: No results for input(s): LABPROT, INR in the last 72 hours. ABG    Component Value Date/Time   TCO2 15 09/18/2009 0923   CBG (last 3)  No results for input(s): GLUCAP in the last 72 hours.  Meds Scheduled Meds: . citalopram  40 mg Oral Daily  . enoxaparin (LOVENOX) injection  20 mg Subcutaneous Q24H  . mometasone-formoterol  2 puff Inhalation BID  . morphine  60 mg Oral Q12H  . oxyCODONE  10 mg Oral TID  . sodium chloride flush  3 mL Intravenous Q12H   Continuous Infusions: . sodium chloride Stopped (10/07/16 0856)   PRN Meds:.ondansetron **OR**  ondansetron (ZOFRAN) IV  Xrays Dg Chest Port 1 View  Result Date: 10/07/2016 CLINICAL DATA:  Left-sided pneumothorax with chest tube treatment. EXAM: PORTABLE CHEST 1 VIEW COMPARISON:  Portable chest x-ray of October 06, 2016 FINDINGS: The patient is mildly rotated toward the left. There is a persistent left-sided pneumothorax similar in volume to that seen on the previous study. The pleural space air lies predominantly medially in the mid and upper hemi thorax and laterally in the lower hemi thorax. The small caliber left chest tube overlies the posterior lateral aspects of the sixth and seventh ribs. The right lung is hyperinflated and clear. The heart is normal in size. The pulmonary vascularity is not engorged. There is calcification in the wall of the thoracic aorta. There is no pleural effusion. The observed bony thorax is unremarkable. IMPRESSION: Persistent left-sided pneumothorax likely occupying 30% of the pleural space volume. The chest tube is in reasonable position. Probable underlying COPD. Electronically Signed   By: David  [place]az[place] M.D.   On: 10/07/2016 07:44   Dg Chest Port 1 View  Result Date: 10/06/2016 CLINICAL DATA:  Left pneumothorax EXAM: PORTABLE CHEST 1 VIEW COMPARISON:  10/06/2016 at 21:25 FINDINGS: A left chest tube has been placed with reduction of the left  pneumothorax. A moderate left pleural air collection remains in the medial, apical and lateral base regions. The right lung remains clear. Pulmonary vasculature is normal. IMPRESSION: Moderate residual left pneumothorax although it is substantially reduced after chest tube placement. Electronically Signed   By: Ellery Plunkaniel R Mitchell M.D.   On: 10/06/2016 23:18   Dg Chest Port 1 View  Result Date: 10/06/2016 CLINICAL DATA:  Acute onset of left-sided chest pain and tenderness. Initial encounter. EXAM: PORTABLE CHEST 1 VIEW COMPARISON:  Chest radiograph performed 05/22/2016 FINDINGS: There is a large left-sided pneumothorax,  with partial collapse of the left lung. No definite tension is seen. The right lung appears clear. No pleural effusion is identified. The cardiomediastinal silhouette remains normal in size. No acute osseous abnormalities are seen. IMPRESSION: Large left-sided pneumothorax, with partial collapse of the left lung. No evidence of tension at this time. Critical Value/emergent results were called by telephone at the time of interpretation on 10/06/2016 at 9:40 pm to Dr. Lyndal PulleyANIEL KNOTT, who verbally acknowledged these results. Electronically Signed   By: Roanna RaiderJeffery  Chang M.D.   On: 10/06/2016 21:41    Assessment/Plan: L pntx - recheck CXR now that tube is on suction. No air leak but cough effort is poor secondary to pain    LOS: 1 day    Michele Lucas,Michele Lucas 10/07/2016  Chest tube needs to be on suction currently Follow up chest xray in am I have seen and examined Michele Lucas and agree with the above assessment  and plan.  Delight OvensEdward B Lakevia Perris MD [facility] [phone] Office [phone] 10/07/2016 5:53 PM

## 2016-10-07 NOTE — Progress Notes (Signed)
   Subjective: Patient appeared in distress this morning. She continues to have severe left-sided chest pain. She did state that her shortness of breath was improved from presentation. She only asked for better pain control but had no additional questions this morning.  Objective:  Vital signs in last 24 hours: Vitals:   10/07/16 0236 10/07/16 0719 10/07/16 0900 10/07/16 1012  BP: 127/71 (!) 142/72    Pulse: (!) 103 92 (!) 120   Resp: (!) 23 19 (!) 23   Temp: 99 F (37.2 C) 99.4 F (37.4 C)    TempSrc: Oral Oral    SpO2: 99% 96% 96% 97%  Weight:       Physical Exam  Constitutional: She is oriented to person, place, and time.  Patient appeared in acute distress. She was clutching a pillow and laying on her right side secondary to severe pain from her chest tube and pneumothorax. She would not remove this pillow to allow me to auscultate for heart sounds or for breath sounds other than over the left lung which was the site of her back that was exposed.  Breath sounds were absent at the apices but present in the base.  HENT:  Head: Normocephalic and atraumatic.  GI:  Unable to assess secondary to patient positioning and limitation of pain  Musculoskeletal: She exhibits no edema.  Neurological: She is alert and oriented to person, place, and time.     Assessment/Plan: 1. Spontaneous left pneumothorax The patient presented with severe left chest pain which was demonstrated on chest radiograph secondary to a left pneumothorax. This pneumothorax occurred after several days of violent vomiting secondary to opioid withdrawal. The patient has several risk factors which predisposes her to developing a pneumothorax including COPD and smoking. I think the most likely etiology for the patient's pneumothorax was secondary to bleb rupture in the setting of increased intrathoracic pressure during episodes of emesis. A chest tube was placed in the emergency department. Cardiovascular thoracic surgery  is following regarding management of the chest tube. -- Follow-up CVTS recommendations -- Chest tube -- Fentanyl 50mcg every hour prn -- MS Contin 60 mg every 12 hours -- Oxycodone immediate release 10 mg 3 times a day   2. Chronic Pain She is on an extensive opioid regimen at home for chronic pain. We will resume this home regimen. Additionally, we have added fentanyl 50 mcg as above for continued management of her pain secondary to left pneumothorax and chest tube -- Oxycodone immediate release 10 mg 3 times daily -- MS Contin 60 mg every 12 hours -- Fentanyl as above  3. COPD Stable. On Symbicort.  She states she was diagnosed at the [facility] with COPD two years ago. -- Continue home medications  4. Raynaud syndrome [facility].  Those medical notes are not in EPIC. She states she has raynaud's syndrome and had a recent flare that only affect her feet.  She was tender on exam to her lower extremities bilaterally. She continues to be tender on palpation today. -- Monitor    --  We will discuss calcium channel blocker with the patient when she is able  5. Anxiety Stable.  Patient takes celexa 40mg  daily - Continue celexa  Diet:  Heart, thin DVT prophylaxis: subq lovenox Code status:  Full code    Dispo: Anticipated discharge in approximately 1-2 day(s).   Thomasene LotJames Toy Eisemann, MD 10/07/2016, 11:19 AM Pager: [phone]

## 2016-10-08 ENCOUNTER — Inpatient Hospital Stay (HOSPITAL_COMMUNITY)

## 2016-10-08 LAB — BASIC METABOLIC PANEL
Anion gap: 8 (ref 5–15)
BUN: 15 mg/dL (ref 6–20)
CHLORIDE: 105 mmol/L (ref 101–111)
CO2: 23 mmol/L (ref 22–32)
Calcium: 9.4 mg/dL (ref 8.9–10.3)
Creatinine, Ser: 0.56 mg/dL (ref 0.44–1.00)
GFR calc non Af Amer: >60 mL/min (ref >60)
Glucose, Bld: 100 mg/dL — ABNORMAL HIGH (ref 65–99)
POTASSIUM: 4 mmol/L (ref 3.5–5.1)
SODIUM: 136 mmol/L (ref 135–145)

## 2016-10-08 LAB — URINALYSIS, ROUTINE W REFLEX MICROSCOPIC
BILIRUBIN URINE: NEGATIVE
Glucose, UA: NEGATIVE mg/dL
KETONES UR: NEGATIVE mg/dL
Leukocytes, UA: NEGATIVE
NITRITE: NEGATIVE
PH: 6 (ref 5.0–8.0)
Protein, ur: NEGATIVE mg/dL
SPECIFIC GRAVITY, URINE: 1.023 (ref 1.005–1.030)

## 2016-10-08 LAB — URINE MICROSCOPIC-ADD ON

## 2016-10-08 MED ORDER — FENTANYL CITRATE (PF) 100 MCG/2ML IJ SOLN
50.0000 ug | INTRAMUSCULAR | Status: DC | PRN
  Administered 2016-10-08 (×2): 50 ug via INTRAVENOUS
  Filled 2016-10-08 (×2): qty 2

## 2016-10-08 MED ORDER — FENTANYL CITRATE (PF) 100 MCG/2ML IJ SOLN
INTRAMUSCULAR | Status: AC
  Filled 2016-10-08: qty 2

## 2016-10-08 MED ORDER — FENTANYL CITRATE (PF) 100 MCG/2ML IJ SOLN
50.0000 ug | INTRAMUSCULAR | Status: DC | PRN
  Administered 2016-10-08 – 2016-10-09 (×4): 50 ug via INTRAVENOUS
  Filled 2016-10-08 (×4): qty 2

## 2016-10-08 NOTE — Clinical Social Work Note (Addendum)
Clinical Social Work Assessment  Patient Details  Name: Michele Lucas MRN: [id number] Date of Birth: 02/04/1966  Date of referral:  10/08/16               Reason for consult:  Substance Use/ETOH Abuse                Permission sought to share information with:  Family Supports Permission granted to share information::  Yes, Verbal Permission Granted  Name::     Rochel BromeBrionna Ayoub (Daughter) [phone]  Agency::     Relationship::     Contact Information:     Housing/Transportation Living arrangements for the past 2 months:  Single Family Home Source of Information:  Patient Patient Interpreter Needed:  None Criminal Activity/Legal Involvement Pertinent to Current Situation/Hospitalization:  No - Comment as needed Significant Relationships:  Adult Children, Other Family Members, Spouse, Community Support Lives with:  Spouse Do you feel safe going back to the place where you live?  Yes Need for family participation in patient care:  No (Coment)  Care giving concerns:  Patient reports no concerns. It should be noted that Patient's [facility] and family expressed concerns with Patient abusing her prescription pain medications.    Social Worker assessment / plan:  Patient is a 50 y.o. female who presents with left chest pain starting in left neck and spreading down starting yesterday and worsening today. She is on high dose narcotic therapy for chronic pain and ran out of medicine 2 days ago which precipitated withdrawal with vomiting, diarrhea and sweatiness. CSW engaged with Patient at her bedside. CSW introduced, self, role of CSW, and discussed concerns for prescription pain medication abuse. Patient denies abusing her pain medications. She reports that she is prescribed Oxycodone and Morphine for her chronic pain and reports that she is prescribed her medication prescribed by the [facility]. She states she has been taking her morphine as prescribed but has been taking an extra dose of  oxycodone daily because of the increased stress and physical activity required to take care of her husband that has been in and out of the hospital and currently in a rehab center.  She has been having withdrawal symptoms for the past 3 days. 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 Patient reports that she used to receive mental health services but stopped when her husband got sick. CSW provided Patient with resources for substance abuse. Patient receptive to information provided. CSW provided emotional support and supportive counseling. Patient denies any further concerns at this time. CSW attempted Patient's daughter for further collateral information. HIPAA compliant voice message left. CSW signing off at this time. Please contact if new need(s) arise.   Employment status:  Disabled (Comment on whether or not currently receiving Disability) Insurance information:  VA Benefit PT Recommendations:  Not assessed at this time Information / Referral to community resources:  Residential Substance Abuse Treatment Options, Outpatient Substance Abuse Treatment Options  Patient/Family's Response to care:  Patient reports that her care has "been wonderful". She denies any concerns at this time.   Patient/Family's Understanding of and Emotional Response to Diagnosis, Current Treatment, and Prognosis:  Patient verbalizes an understanding of her diagnosis, current treatment, and prognosis. Patient denies abusing her pain medications. Patient able to describe in great detail what led to her present hospitalization. Patient agreeable to substance abuse resources however does not feel that she is abusing her medications.   Emotional Assessment Appearance:  Appears stated age Attitude/Demeanor/Rapport:   (Cooperative; Engaging; Appropriate) Affect (typically observed):  Appropriate, Calm, Pleasant, Stable Orientation:  Oriented to Self, Oriented to Place, Oriented  to  Time, Oriented to Situation Alcohol / Substance use:  Tobacco Use, Other (Prescription Drugs) Psych involvement (Current and /or in the community):  Outpatient Provider  Discharge Needs  Concerns to be addressed:  Care Coordination, Substance Abuse Concerns Readmission within the last 30 days:  No Current discharge risk:  None Barriers to Discharge:  Continued Medical Work up   North[place] Airlinesshley N Gardner, LCSW 10/08/2016, 2:25 PM

## 2016-10-08 NOTE — Progress Notes (Addendum)
[address]       [place] [id number]             [phone]         Subjective: C/o pain from tube  Objective: Vital signs in last 24 hours: Temp:  [97.6 F (36.4 C)-100.2 F (37.9 C)] 97.6 F (36.4 C) (10/31 0341) Pulse Rate:  [90-120] 90 (10/31 0341) Cardiac Rhythm: Normal sinus rhythm (10/31 0341) Resp:  [17-26] 23 (10/31 0341) BP: (119-136)/(63-78) 122/73 (10/31 0341) SpO2:  [92 %-97 %] 94 % (10/31 0749)  Hemodynamic parameters for last 24 hours:    Intake/Output from previous day: 10/30 0701 - 10/31 0700 In: 886.7 [P.O.:240; I.V.:646.7] Out: 165 [Urine:150; Chest Tube:15] Intake/Output this shift: No intake/output data recorded.  General appearance: alert, cooperative and no distress Heart: regular rate and rhythm Lungs: coarse Abdomen: nontender Extremities: no edema  Lab Results:  Recent Labs  10/06/16 2114  WBC 16.5*  HGB 15.8*  HCT 46.2*  PLT 267   BMET:  Recent Labs  10/07/16 0405 10/08/16 0722  NA 136 136  K 3.7 4.0  CL 107 105  CO2 21* 23  GLUCOSE 119* 100*  BUN 21* 15  CREATININE 0.61 0.56  CALCIUM 9.5 9.4    PT/INR: No results for input(s): LABPROT, INR in the last 72 hours. ABG    Component Value Date/Time   TCO2 15 09/18/2009 0923   CBG (last 3)  No results for input(s): GLUCAP in the last 72 hours.  Meds Scheduled Meds: . citalopram  40 mg Oral Daily  . enoxaparin (LOVENOX) injection  20 mg Subcutaneous Q24H  . mometasone-formoterol  2 puff Inhalation BID  . morphine  60 mg Oral Q12H  . oxyCODONE  10 mg Oral TID  . senna-docusate  2 tablet Oral QHS  . sodium chloride flush  3 mL Intravenous Q12H   Continuous Infusions:  PRN Meds:.ondansetron **OR** ondansetron (ZOFRAN) IV  Xrays Dg Chest Port 1 View  Result Date: 10/07/2016 CLINICAL DATA:  Left-sided pneumothorax. EXAM: PORTABLE CHEST 1 VIEW COMPARISON:  Earlier today at 0736 hours. FINDINGS: 1125 hours. Left chest tube is unchanged in  position. Decrease in size of pneumothorax, with the visceral pleural line 12 mm from the chest wall currently versus 23 mm previously. Approximately 10%. Patient rotated left. Normal heart size. Minimal left pleural fluid suspected. Clear right lung. Patchy left base atelectasis is increased. IMPRESSION: Left chest tube remaining in place, with decrease in approximately 10% pneumothorax. Electronically Signed   By: Jeronimo GreavesKyle  Talbot M.D.   On: 10/07/2016 11:58   Dg Chest Port 1 View  Result Date: 10/07/2016 CLINICAL DATA:  Left-sided pneumothorax with chest tube treatment. EXAM: PORTABLE CHEST 1 VIEW COMPARISON:  Portable chest x-ray of October 06, 2016 FINDINGS: The patient is mildly rotated toward the left. There is a persistent left-sided pneumothorax similar in volume to that seen on the previous study. The pleural space air lies predominantly medially in the mid and upper hemi thorax and laterally in the lower hemi thorax. The small caliber left chest tube overlies the posterior lateral aspects of the sixth and seventh ribs. The right lung is hyperinflated and clear. The heart is normal in size. The pulmonary vascularity is not engorged. There is calcification in the wall of the thoracic aorta. There is no pleural effusion. The observed bony thorax is unremarkable. IMPRESSION: Persistent left-sided pneumothorax likely occupying 30% of the pleural space volume. The chest tube is in  reasonable position. Probable underlying COPD. Electronically Signed   By: David  SwazilandJordan M.D.   On: 10/07/2016 07:44   Dg Chest Port 1 View  Result Date: 10/06/2016 CLINICAL DATA:  Left pneumothorax EXAM: PORTABLE CHEST 1 VIEW COMPARISON:  10/06/2016 at 21:25 FINDINGS: A left chest tube has been placed with reduction of the left pneumothorax. A moderate left pleural air collection remains in the medial, apical and lateral base regions. The right lung remains clear. Pulmonary vasculature is normal. IMPRESSION: Moderate residual  left pneumothorax although it is substantially reduced after chest tube placement. Electronically Signed   By: Ellery Plunkaniel R Mitchell M.D.   On: 10/06/2016 23:18   Dg Chest Port 1 View  Result Date: 10/06/2016 CLINICAL DATA:  Acute onset of left-sided chest pain and tenderness. Initial encounter. EXAM: PORTABLE CHEST 1 VIEW COMPARISON:  Chest radiograph performed 05/22/2016 FINDINGS: There is a large left-sided pneumothorax, with partial collapse of the left lung. No definite tension is seen. The right lung appears clear. No pleural effusion is identified. The cardiomediastinal silhouette remains normal in size. No acute osseous abnormalities are seen. IMPRESSION: Large left-sided pneumothorax, with partial collapse of the left lung. No evidence of tension at this time. Critical Value/emergent results were called by telephone at the time of interpretation on 10/06/2016 at 9:40 pm to Dr. Lyndal PulleyANIEL KNOTT, who verbally acknowledged these results. Electronically Signed   By: Roanna RaiderJeffery  Chang M.D.   On: 10/06/2016 21:41    Assessment/Plan: 1 lung is expanded on suction- appears to have a large lower lobe bleb on left. Keep on suction 2 mobilize as able- PT is consulted   LOS: 2 days    GOLD,WAYNE E 10/08/2016  I have seen and examined Michele Lucas and agree with the above assessment  and plan.  Delight OvensEdward B Arthor Gorter MD Beeper [phone] Office [phone] 10/08/2016 6:59 PM

## 2016-10-08 NOTE — Evaluation (Signed)
Physical Therapy Evaluation Patient Details Name: Michele Lucas MRN: [id number] DOB: 07/04/1966 Today's Date: 10/08/2016   History of Present Illness  Patient is a 50 y/o female with hx of PTSD, fibromyalgia, COPD, endometrosis, chronic pain back presents with left PTX s/p chest tube placement.   Clinical Impression  Patient presents with pain, mild DOE and decreased mobility s/p chest tube and PTX. Tolerated standing and SPT to chair with Min A for balance/safety. Education re: log roll technique, bracing and importance of mobility. Pt will need supervision at home initially. Will plan for gait training next session as tolerated. Will follow acutely to maximize independence and mobility prior to return home.    Follow Up Recommendations  (pending progress)HHPT and supervision for OOB/mobility    Equipment Recommendations  Rolling walker with 5" wheels    Recommendations for Other Services       Precautions / Restrictions Precautions Precautions: Fall Precaution Comments: chest tube Restrictions Weight Bearing Restrictions: No      Mobility  Bed Mobility Overal bed mobility: Needs Assistance Bed Mobility: Rolling;Sidelying to Sit Rolling: Min guard Sidelying to sit: Min guard;HOB elevated       General bed mobility comments: Increased time to get to EOB with step by step cues. No dizziness.  Transfers Overall transfer level: Needs assistance Equipment used: 1 person hand held assist Transfers: Sit to/from UGI CorporationStand;Stand Pivot Transfers Sit to Stand: Min assist Stand pivot transfers: Min assist       General transfer comment: Assist to boost from EOB with cues for hand placement. No dizziness. SPT bed to chair with Min A. Sp02 dropped to high 80s on 02 but resolved quickly.  Ambulation/Gait                Stairs            Wheelchair Mobility    Modified Rankin (Stroke Patients Only)       Balance Overall balance assessment: Needs  assistance Sitting-balance support: Feet supported;No upper extremity supported Sitting balance-Leahy Scale: Fair     Standing balance support: During functional activity Standing balance-Leahy Scale: Poor Standing balance comment: Reliant on UE support in static and dynamic standing for balance. Difficulty getting upright due to pain from chest tube.                             Pertinent Vitals/Pain Pain Assessment: Faces Faces Pain Scale: Hurts even more Pain Location: chest tube site Pain Descriptors / Indicators: Sore;Aching;Grimacing;Guarding Pain Intervention(s): Monitored during session;Repositioned;Limited activity within patient's tolerance    Home Living Family/patient expects to be discharged to:: Private residence Living Arrangements: Spouse/significant other Available Help at Discharge: Family Type of Home: House Home Access: Stairs to enter Entrance Stairs-Rails: Right Entrance Stairs-Number of Steps: 2 Home Layout: One level Home Equipment: Cane - single point;Crutches      Prior Function Level of Independence: Independent with assistive device(s)         Comments: Pt uses SPC vs crutches when having a Raynaud's flair up in feet.      Hand Dominance        Extremity/Trunk Assessment   Upper Extremity Assessment: Defer to OT evaluation           Lower Extremity Assessment: Generalized weakness         Communication   Communication: No difficulties  Cognition Arousal/Alertness: Awake/alert Behavior During Therapy: WFL for tasks assessed/performed Overall Cognitive Status: Within Functional Limits for  tasks assessed                      General Comments      Exercises     Assessment/Plan    PT Assessment Patient needs continued PT services  PT Problem List Decreased strength;Decreased mobility;Decreased activity tolerance;Decreased balance;Pain;Decreased knowledge of use of DME;Cardiopulmonary status limiting  activity          PT Treatment Interventions DME instruction;Therapeutic activities;Gait training;Therapeutic exercise;Patient/family education;Balance training;Functional mobility training;Stair training    PT Goals (Current goals can be found in the Care Plan section)  Acute Rehab PT Goals Patient Stated Goal: to feel better and get this chest tube out PT Goal Formulation: With patient Time For Goal Achievement: 10/22/16 Potential to Achieve Goals: Fair    Frequency Min 3X/week   Barriers to discharge        Co-evaluation               End of Session   Activity Tolerance: Patient limited by pain Patient left: in chair;with call bell/phone within reach;with nursing/sitter in room Nurse Communication: Mobility status         Time: [id number] PT Time Calculation (min) (ACUTE ONLY): 18 min   Charges:   PT Evaluation $PT Eval Moderate Complexity: 1 Procedure     PT G Codes:        Jissell Trafton A Tonishia Steffy 10/08/2016, 10:45 AM Mylo RedShauna Shakora Nordquist, PT, DPT [phone]

## 2016-10-08 NOTE — Progress Notes (Signed)
Subjective: No acute events overnight. Patient continues to have severe left-sided pleuritic chest pain associated with her chest tube and pneumothorax. She did receive additional pain medication this morning which helped her. She worked with physical therapy and was sitting up in a chair. She denied nausea, vomiting or abdominal pain. Overall, she states that her breathing has improved. She had no additional questions this morning.  Objective:  Vital signs in last 24 hours: Vitals:   10/08/16 0749 10/08/16 0814 10/08/16 1245 10/08/16 1323  BP:  (!) 187/75 112/65 127/60  Pulse:  89 94   Resp:  (!) 28 (!) 24   Temp:  98 F (36.7 C) 98.1 F (36.7 C)   TempSrc:  Axillary Oral   SpO2: 94% 91% 90%   Weight:       Physical Exam  Constitutional: She is oriented to person, place, and time. She appears well-developed and well-nourished.  Appears in pain but no longer in acute distress  HENT:  Head: Normocephalic and atraumatic.  Cardiovascular: Regular rhythm.  Exam reveals no friction rub.   No murmur heard. Tachycardic on examination  Respiratory:  Left upper lobe quiet on auscultation, right lung fields normal, left middle and lower lobe with some crackles but the patient is moving air. She has a left chest tube present with a large bandage over the area.  GI: Soft. Bowel sounds are normal. She exhibits no distension. There is no tenderness.  Musculoskeletal: She exhibits no edema.  Neurological: She is alert and oriented to person, place, and time.     Assessment/Plan:  In summary, patient continues to have left-sided chest pain related to her chest tube and pneumothorax. She worked with PT and was able to sit up in bed. She is receiving more of her pain medication which has greatly improved her pain. We did a point-of-care ultrasound and we noticed  A lines and no presence of B lines. We did not appreciate movement associated with the visceral or parietal pleura at the lung apices.  No sliding sign identified. For now, we will continue with chest tube attached to suction and follow recommendations by cardiovascular thoracic surgery.  1. Spontaneous left pneumothorax A lines present by point-of-care ultrasound. No B lines appreciated. No sliding sign identified. We will continue with chest tube attached to suction. -- Follow-up CVTS recommendations -- Chest tube -- Fentanyl 50mcg every 2 hours prn -- MS Contin 60 mg every 12 hours -- Oxycodone immediate release 10 mg 3 times a day   2. Chronic Pain She is on an extensive opioid regimen at home for chronic pain. We will resume this home regimen. Additionally, we have added fentanyl 50 mcg as above for continued management of her pain secondary to left pneumothorax and chest tube -- Oxycodone immediate release 10 mg 3 times daily -- MS Contin 60 mg every 12 hours -- Fentanyl as above  3. COPD Stable. On Symbicort. She states she was [facility] two [facility]. -- Continue home medications  4. Raynaud syndrome [facility] Those medical notes are not in EPIC. She states she has raynaud's syndrome and had a recent flare that only affect her feet. She was tender on exam to her lower extremities bilaterally. She continues to be tender on palpation today. -- Monitor  --  We will discuss calcium channel blocker with the patient when she is able  5. Anxiety Stable. Patient takes celexa  40mg  daily - Continue celexa  Diet: Heart, thin DVT prophylaxis: subq lovenox Code status: Full code  Dispo: Anticipated discharge in approximately 1-2 day(s).   Thomasene LotJames Delroy Ordway, MD 10/08/2016, 1:24 PM Pager: [phone]

## 2016-10-08 NOTE — Care Management Note (Signed)
Case Management Note  Patient Details  Name: Michele BariCarla Papandrea MRN: [id number] Date of Birth: 01/24/1966  Subjective/Objective:   Patient with l ptx, chest tube to suction, per pt eval rec hhpt and rolling walker.  Will offer choice to patient.                 Action/Plan:   Expected Discharge Date:                  Expected Discharge Plan:  Home w Home Health Services  In-House Referral:     Discharge planning Services  CM Consult  Post Acute Care Choice:    Choice offered to:     DME Arranged:    DME Agency:     HH Arranged:    HH Agency:     Status of Service:  In process, will continue to follow  If discussed at Long Length of Stay Meetings, dates discussed:    Additional Comments:  Leone Havenaylor, Ivin Rosenbloom Clinton, RN 10/08/2016, 4:53 PM

## 2016-10-09 ENCOUNTER — Inpatient Hospital Stay (HOSPITAL_COMMUNITY)

## 2016-10-09 DIAGNOSIS — M797 Fibromyalgia: Secondary | ICD-10-CM

## 2016-10-09 LAB — CBC
HCT: 39.2 % (ref 36.0–46.0)
HEMOGLOBIN: 12.3 g/dL (ref 12.0–15.0)
MCH: 30.5 pg (ref 26.0–34.0)
MCHC: 31.4 g/dL (ref 30.0–36.0)
MCV: 97.3 fL (ref 78.0–100.0)
PLATELETS: 171 10*3/uL (ref 150–400)
RBC: 4.03 MIL/uL (ref 3.87–5.11)
RDW: 13.7 % (ref 11.5–15.5)
WBC: 9 10*3/uL (ref 4.0–10.5)

## 2016-10-09 LAB — BASIC METABOLIC PANEL
ANION GAP: 8 (ref 5–15)
BUN: 11 mg/dL (ref 6–20)
CALCIUM: 9.1 mg/dL (ref 8.9–10.3)
CO2: 28 mmol/L (ref 22–32)
CREATININE: 0.62 mg/dL (ref 0.44–1.00)
Chloride: 101 mmol/L (ref 101–111)
Glucose, Bld: 103 mg/dL — ABNORMAL HIGH (ref 65–99)
Potassium: 3.9 mmol/L (ref 3.5–5.1)
SODIUM: 137 mmol/L (ref 135–145)

## 2016-10-09 MED ORDER — ENOXAPARIN SODIUM 30 MG/0.3ML ~~LOC~~ SOLN
30.0000 mg | SUBCUTANEOUS | Status: DC
  Administered 2016-10-10 – 2016-10-13 (×4): 30 mg via SUBCUTANEOUS
  Filled 2016-10-09 (×4): qty 0.3

## 2016-10-09 MED ORDER — OXYCODONE HCL 5 MG PO TABS
10.0000 mg | ORAL_TABLET | Freq: Four times a day (QID) | ORAL | Status: DC | PRN
  Administered 2016-10-09 (×3): 10 mg via ORAL
  Filled 2016-10-09 (×3): qty 2

## 2016-10-09 MED ORDER — MORPHINE SULFATE ER 30 MG PO TBCR
60.0000 mg | EXTENDED_RELEASE_TABLET | Freq: Three times a day (TID) | ORAL | Status: DC
  Administered 2016-10-09 – 2016-10-10 (×2): 60 mg via ORAL
  Filled 2016-10-09 (×2): qty 2

## 2016-10-09 NOTE — Progress Notes (Signed)
Physical Therapy Treatment Patient Details Name: Michele Lucas MRN: [id number] DOB: 11/13/1966 Today's Date: 10/09/2016    History of Present Illness Patient is a 50 y/o female with hx of PTSD, fibromyalgia, COPD, endometrosis, chronic pain back presents with left PTX s/p chest tube placement.     PT Comments    Pt ambulated 4480' today with RW and min A on 3L O2 with sats maintained 93%, HR up to 106 bpm. Pt continues to be limited  By left sided pain from chest tube as well as fatigue. PT will continue to follow.   Follow Up Recommendations  Home health PT;Supervision for mobility/OOB     Equipment Recommendations  Rolling walker with 5" wheels    Recommendations for Other Services       Precautions / Restrictions Precautions Precautions: Fall Precaution Comments: chest tube Restrictions Weight Bearing Restrictions: No    Mobility  Bed Mobility Overal bed mobility: Needs Assistance Bed Mobility: Sidelying to Sit;Sit to Supine   Sidelying to sit: Supervision   Sit to supine: Supervision   General bed mobility comments: pt already on side with HOB elevated, was able to pivot to EOB with supervision. Pt returned to SL position with assist only for management of lines  Transfers Overall transfer level: Needs assistance Equipment used: Rolling walker (2 wheeled) Transfers: Sit to/from [employer]Stand;Stand Pivot Transfers Sit to Stand: Min assist         General transfer comment: vc's for hand placement, min A for power up, increased time needed to stand. Pt also pivoted from recliner to [facility] with min A. Instructed her to begin using BSC or toilet with nursing staff instead of bed pan  Ambulation/Gait Ambulation/Gait assistance: Min assist Ambulation Distance (Feet): 80 Feet Assistive device: Rolling walker (2 wheeled) Gait Pattern/deviations: Decreased stride length;Trunk flexed Gait velocity: decreased Gait velocity interpretation: <1.8 ft/sec, indicative of risk for  recurrent falls General Gait Details: guarded gait with very short step length but pt glad to be ambulating. O2 sats 93% on 3L O2. HR up to 106 bpm with ambulation.    Stairs            Wheelchair Mobility    Modified Rankin (Stroke Patients Only)       Balance Overall balance assessment: Needs assistance Sitting-balance support: Single extremity supported Sitting balance-Leahy Scale: Fair Sitting balance - Comments: limited by pain   Standing balance support: No upper extremity supported Standing balance-Leahy Scale: Fair Standing balance comment: able to pivot to [facility] without UE support                    Cognition Arousal/Alertness: Awake/alert Behavior During Therapy: WFL for tasks assessed/performed Overall Cognitive Status: Within Functional Limits for tasks assessed                      Exercises      General Comments        Pertinent Vitals/Pain Pain Assessment: Faces Faces Pain Scale: Hurts even more Pain Location: left chest Pain Descriptors / Indicators: Guarding;Sore Pain Intervention(s): Limited activity within patient's tolerance;Monitored during session;Premedicated before session    Home Living                      Prior Function            PT Goals (current goals can now be found in the care plan section) Acute Rehab PT Goals Patient Stated Goal: to feel better and get  this chest tube out PT Goal Formulation: With patient Time For Goal Achievement: 10/22/16 Potential to Achieve Goals: Fair Progress towards PT goals: Progressing toward goals    Frequency    Min 3X/week      PT Plan Current plan remains appropriate    Co-evaluation             End of Session Equipment Utilized During Treatment: Oxygen Activity Tolerance: Patient limited by pain Patient left: in bed;with call bell/phone within reach     Time: 1007-1045 PT Time Calculation (min) (ACUTE ONLY): 38 min  Charges:  $Gait Training:  23-37 mins $Therapeutic Activity: 8-22 mins                    G Codes:     Lyanne CoVictoria Deigo Alonso, PT  Acute Rehab Services  [phone]  Lyanne CoManess, Mandee Pluta 10/09/2016, 12:13 PM

## 2016-10-09 NOTE — Progress Notes (Signed)
   Subjective: Patient endorses improvement in functionality and shortness of breath, though still in significant pain that limits regular respirations. She endorses one episode of hemoptysis early this AM with no other productive cough before or since. She has an appetite and is eager to eat again.   Objective:  Vital signs in last 24 hours: Vitals:   10/09/16 0830 10/09/16 1002 10/09/16 1229 10/09/16 1515  BP: 109/69  (!) 101/57 118/66  Pulse: 88  96 100  Resp: 15  (!) 21 17  Temp: 98.1 F (36.7 C)  98 F (36.7 C) 97.4 F (36.3 C)  TempSrc: Oral  Oral Oral  SpO2: 100% 97% 98% 96%  Weight:       Constitutional: sitting up in bed, appears in mild discomfort CV: RRR, no murmurs, rubs or gallops appreciated, pulses intact Resp: Breath sounds appreciated in all lung regions, mild crackles Abd: soft, +BS MSK: moves all 4 extremities freely Neuro: A&O x3  Assessment/Plan:  Active Problems:   Pneumothorax on left  Spontaneous left pneumothorax: AM CXR consistent with almost complete re-expansion of L lung. Chest tube on suction  --f/u CTVS recc's, continue chest tube, switch to water seal --MS Contin 60mg  TID, Oxy IR 10mg  q6hr   Chronic pain 2/2 fibromyalgia:  Home regimen of MS Contin 60mg  q12 and Oxy IR 20 TID; working on adequately managing her pain in setting of chest tube --MS Contin 60mg  TID, oxy IR 10mg  q6hr  COPD:  On symbicort at home. Continue inpatient.  Anxiety:  Patient on Celexa 40mg  daily --continue home med  Dispo: Anticipated discharge in approximately 2 day(s).   Nyra MarketGorica Michele Harr, MD 10/09/2016, 5:22 PM Pager: Nyra MarketGorica Kailen Hinkle, MD IMTS - PGY1 Pager [phone]

## 2016-10-09 NOTE — Progress Notes (Addendum)
[address]       [place] [id number]             [phone]         Subjective: Feeling a bit better, some productive cough  Objective: Vital signs in last 24 hours: Temp:  [97.4 F (36.3 C)-99.1 F (37.3 C)] 98.3 F (36.8 C) (11/01 0250) Pulse Rate:  [83-102] 102 (11/01 0250) Cardiac Rhythm: Normal sinus rhythm (11/01 0808) Resp:  [19-28] 19 (11/01 0250) BP: (112-187)/(60-75) 140/72 (11/01 0250) SpO2:  [90 %-99 %] 98 % (11/01 0250)  Hemodynamic parameters for last 24 hours:    Intake/Output from previous day: 10/31 0701 - 11/01 0700 In: 123 [P.O.:120; I.V.:3] Out: 0  Intake/Output this shift: No intake/output data recorded.  General appearance: alert, cooperative and no distress Heart: regular rate and rhythm Lungs: clear to auscultation bilaterally  Lab Results:  Recent Labs  10/06/16 2114 10/09/16 0422  WBC 16.5* 9.0  HGB 15.8* 12.3  HCT 46.2* 39.2  PLT 267 171   BMET:  Recent Labs  10/08/16 0722 10/09/16 0422  NA 136 137  K 4.0 3.9  CL 105 101  CO2 23 28  GLUCOSE 100* 103*  BUN 15 11  CREATININE 0.56 0.62  CALCIUM 9.4 9.1    PT/INR: No results for input(s): LABPROT, INR in the last 72 hours. ABG    Component Value Date/Time   TCO2 15 09/18/2009 0923   CBG (last 3)  No results for input(s): GLUCAP in the last 72 hours.  Meds Scheduled Meds: . citalopram  40 mg Oral Daily  . enoxaparin (LOVENOX) injection  20 mg Subcutaneous Q24H  . mometasone-formoterol  2 puff Inhalation BID  . morphine  60 mg Oral Q12H  . oxyCODONE  10 mg Oral TID  . senna-docusate  2 tablet Oral QHS  . sodium chloride flush  3 mL Intravenous Q12H   Continuous Infusions:  PRN Meds:.fentaNYL (SUBLIMAZE) injection, ondansetron **OR** ondansetron (ZOFRAN) IV  Xrays Dg Chest Port 1 View  Result Date: 10/08/2016 CLINICAL DATA:  Shortness of breath, left chest tube EXAM: PORTABLE CHEST 1 VIEW COMPARISON:  10/07/2016 FINDINGS: Left chest  tube remains in place. No visible pneumothorax. Elevation of the left hemidiaphragm with left base atelectasis. Right lung is clear. Heart is normal size. No visible effusion. IMPRESSION: Left chest tube in place without visible pneumothorax. Elevated left hemidiaphragm with left base atelectasis. Electronically Signed   By: Charlett NoseKevin  Dover M.D.   On: 10/08/2016 08:40   Dg Chest Port 1 View  Result Date: 10/07/2016 CLINICAL DATA:  Left-sided pneumothorax. EXAM: PORTABLE CHEST 1 VIEW COMPARISON:  Earlier today at 0736 hours. FINDINGS: 1125 hours. Left chest tube is unchanged in position. Decrease in size of pneumothorax, with the visceral pleural line 12 mm from the chest wall currently versus 23 mm previously. Approximately 10%. Patient rotated left. Normal heart size. Minimal left pleural fluid suspected. Clear right lung. Patchy left base atelectasis is increased. IMPRESSION: Left chest tube remaining in place, with decrease in approximately 10% pneumothorax. Electronically Signed   By: Jeronimo GreavesKyle  Talbot M.D.   On: 10/07/2016 11:58    Assessment/Plan:  1 appears stable, will get CXR. No air leak currently so hopefully can go to H2O seal today 2 medical management per primary svc    LOS: 3 days    GOLD,WAYNE E 10/09/2016   Ct to water seal I have seen and examined Tamala Bariarla Kolk and agree with the  above assessment  and plan.  Delight OvensEdward B Aicia Babinski MD [facility] [phone] Office [phone] 10/09/2016 5:56 PM

## 2016-10-10 ENCOUNTER — Inpatient Hospital Stay (HOSPITAL_COMMUNITY)

## 2016-10-10 DIAGNOSIS — R042 Hemoptysis: Secondary | ICD-10-CM

## 2016-10-10 DIAGNOSIS — J9311 Primary spontaneous pneumothorax: Secondary | ICD-10-CM

## 2016-10-10 MED ORDER — KETOROLAC TROMETHAMINE 30 MG/ML IJ SOLN
30.0000 mg | Freq: Three times a day (TID) | INTRAMUSCULAR | Status: AC | PRN
Start: 2016-10-10 — End: 2016-10-11
  Administered 2016-10-10 – 2016-10-11 (×3): 30 mg via INTRAVENOUS
  Filled 2016-10-10 (×3): qty 1

## 2016-10-10 MED ORDER — OXYCODONE HCL 5 MG PO TABS
10.0000 mg | ORAL_TABLET | ORAL | Status: DC | PRN
  Administered 2016-10-10 – 2016-10-13 (×16): 10 mg via ORAL
  Filled 2016-10-10 (×16): qty 2

## 2016-10-10 MED ORDER — MORPHINE SULFATE ER 15 MG PO TBCR
60.0000 mg | EXTENDED_RELEASE_TABLET | Freq: Two times a day (BID) | ORAL | Status: DC
  Administered 2016-10-10 – 2016-10-13 (×6): 60 mg via ORAL
  Filled 2016-10-10 (×2): qty 4
  Filled 2016-10-10 (×2): qty 2
  Filled 2016-10-10 (×3): qty 4

## 2016-10-10 NOTE — Progress Notes (Addendum)
      [address]       [place] [id number]             [phone]       Subjective:  Continues to have pain along left side.  Pain medication is not providing full relief.  She has also been having hemoptysis, which is dark in color.  Objective: Vital signs in last 24 hours: Temp:  [97.4 F (36.3 C)-98.6 F (37 C)] 97.8 F (36.6 C) (11/02 0321) Pulse Rate:  [82-100] 88 (11/02 0321) Cardiac Rhythm: Normal sinus rhythm (11/02 0711) Resp:  [12-21] 16 (11/02 0321) BP: (96-124)/(56-69) 96/59 (11/02 0321) SpO2:  [92 %-100 %] 94 % (11/02 0321)  Intake/Output from previous day: 11/01 0701 - 11/02 0700 In: 240 [P.O.:240] Out: 210 [Urine:200; Chest Tube:10]  General appearance: alert, cooperative and no distress Heart: regular rate and rhythm Lungs: clear to auscultation bilaterally Abdomen: soft, non-tender; bowel sounds normal; no masses,  no organomegaly Extremities: extremities normal, atraumatic, no cyanosis or edema Wound: clean and dry  Lab Results:  Recent Labs  10/09/16 0422  WBC 9.0  HGB 12.3  HCT 39.2  PLT 171   BMET:  Recent Labs  10/08/16 0722 10/09/16 0422  NA 136 137  K 4.0 3.9  CL 105 101  CO2 23 28  GLUCOSE 100* 103*  BUN 15 11  CREATININE 0.56 0.62  CALCIUM 9.4 9.1    PT/INR: No results for input(s): LABPROT, INR in the last 72 hours. ABG    Component Value Date/Time   TCO2 15 09/18/2009 0923   CBG (last 3)  No results for input(s): GLUCAP in the last 72 hours.  Assessment/Plan:  1. Pneumothorax- CXR shows re-development of 10-15% pneumothorax since transitioned off suction 2. Pulm- wean oxygen as tolerated, hemoptysis likely due to popped bleb, monitor instructed patient to let nursing know immediately if this is bright red in color 3. Pain control- will add Toradol 30 mg every 8 hours prn for 3 doses 4. Dispo- patient placed back to suction, repeat CXR in AM... Care per primary  LOS: 4 days    BARRETT,  ERIN 10/10/2016  Small basilar ptx put back to suction  I have seen and examined Tamala Bariarla Baumgardner and agree with the above assessment  and plan.  Delight OvensEdward B Mustapha Colson MD Bee[facility] [phone] Office [phone] 10/10/2016 9:47 AM

## 2016-10-10 NOTE — Progress Notes (Signed)
   Subjective: Patient states she has had more episodes of coughing up dark blood. States breathing is easier today and she is overall more comfortable but pain is still present. Denies shortness of breath, abdominal pain, or diarrhea. Has not had BM since admission and has been declining stool softener.  Objective:  Vital signs in last 24 hours: Vitals:   10/09/16 2011 10/09/16 2303 10/10/16 0321 10/10/16 0800  BP:  (!) 119/56 (!) 96/59   Pulse:  90 88 91  Resp:  17 16 (!) 21  Temp:  97.6 F (36.4 C) 97.8 F (36.6 C)   TempSrc:  Oral Oral   SpO2: 93% 92% 94% 92%  Weight:       Constitutional: NAD, lying in bed CV: RRR, no murmurs, rubs or gallops appreciated, no LE edema, pulses intact Resp: no increased work of breathing, CTAB, no crackles or wheezing appreciated Abd: soft, +BS, NDNT MSK: Chest tube in place on R lateral chest - dry, clean bandages, moves all extremities without difficulty.  Assessment/Plan:  Active Problems:   Pneumothorax on left  Spontaneous left pneumothorax: AM CXR consistent with development of two areas of pneumo in RU and RL regions since being on water seal. Dark hemoptysis likely due to bleb burst 2/2 COPD and trauma. --f/u CTVS recc's, continue chest tube, switch to suction --MS Contin 60mg  BID, Oxy IR 10mg  q4hr, Toradol q8 PRN --continue monitoring for bright red blood in sputum  Chronic pain 2/2 fibromyalgia:  Home regimen of MS Contin 60mg  q12 and Oxy IR 20 TID; working on adequately managing her pain in setting of chest tube --MS Contin 60mg  BID, Oxy IR 10mg  q4hr, Toradol q8 PRN --encouraged stool softener this PM if no BM  COPD:  On symbicort at home. Continue inpatient.  Anxiety:  Patient on Celexa 40mg  daily --continue home med  Dispo: Anticipated discharge in approximately 1-2 day(s).   Nyra MarketGorica Azile Minardi, MD 10/10/2016, 8:46 AM Pager: Nyra MarketGorica Zeta Bucy, MD IMTS - PGY1 Pager [phone]

## 2016-10-11 ENCOUNTER — Inpatient Hospital Stay (HOSPITAL_COMMUNITY)

## 2016-10-11 LAB — CBC
HCT: 35.3 % — ABNORMAL LOW (ref 36.0–46.0)
HEMOGLOBIN: 11.3 g/dL — AB (ref 12.0–15.0)
MCH: 30.5 pg (ref 26.0–34.0)
MCHC: 32 g/dL (ref 30.0–36.0)
MCV: 95.4 fL (ref 78.0–100.0)
PLATELETS: 176 10*3/uL (ref 150–400)
RBC: 3.7 MIL/uL — AB (ref 3.87–5.11)
RDW: 13.3 % (ref 11.5–15.5)
WBC: 6.5 10*3/uL (ref 4.0–10.5)

## 2016-10-11 LAB — BASIC METABOLIC PANEL
ANION GAP: 5 (ref 5–15)
BUN: 17 mg/dL (ref 6–20)
CO2: 30 mmol/L (ref 22–32)
Calcium: 9.1 mg/dL (ref 8.9–10.3)
Chloride: 102 mmol/L (ref 101–111)
Creatinine, Ser: 0.62 mg/dL (ref 0.44–1.00)
GFR calc Af Amer: >60 mL/min (ref >60)
Glucose, Bld: 88 mg/dL (ref 65–99)
POTASSIUM: 4 mmol/L (ref 3.5–5.1)
SODIUM: 137 mmol/L (ref 135–145)

## 2016-10-11 MED ORDER — SENNOSIDES-DOCUSATE SODIUM 8.6-50 MG PO TABS
2.0000 | ORAL_TABLET | Freq: Every day | ORAL | Status: DC
Start: 2016-10-11 — End: 2016-10-11
  Administered 2016-10-11: 2 via ORAL
  Filled 2016-10-11: qty 2

## 2016-10-11 MED ORDER — KETOROLAC TROMETHAMINE 30 MG/ML IJ SOLN
30.0000 mg | Freq: Three times a day (TID) | INTRAMUSCULAR | Status: DC | PRN
  Administered 2016-10-11 – 2016-10-13 (×3): 30 mg via INTRAVENOUS
  Filled 2016-10-11 (×3): qty 1

## 2016-10-11 MED ORDER — SENNOSIDES-DOCUSATE SODIUM 8.6-50 MG PO TABS
2.0000 | ORAL_TABLET | Freq: Two times a day (BID) | ORAL | Status: DC
  Administered 2016-10-11 – 2016-10-13 (×4): 2 via ORAL
  Filled 2016-10-11 (×4): qty 2

## 2016-10-11 NOTE — Discharge Summary (Signed)
Name: Michele Lucas MRN: [id number] DOB: 10/29/1966 50 y.o. PCP: No Pcp Per Patient  Date of Admission: 10/06/2016  9:02 PM Date of Discharge: 10/20/2016 Attending Physician: No att. providers found  Discharge Diagnosis: 1. Pneumothorax Active Problems:   Pneumothorax on left   Discharge Medications:   Medication List    TAKE these medications   budesonide-formoterol 80-4.5 MCG/ACT inhaler Commonly known as:  SYMBICORT Inhale 2 puffs into the lungs 2 (two) times daily as needed (shortness of breath).   citalopram 40 MG tablet Commonly known as:  CELEXA Take 40 mg by mouth daily.   fluticasone 50 MCG/ACT nasal spray Commonly known as:  FLONASE Place 2 sprays into the nose daily as needed for allergies.   medroxyPROGESTERone 150 MG/ML injection Commonly known as:  DEPO-PROVERA Inject 150 mg into the muscle every 3 (three) months.   morphine 60 MG 12 hr tablet Commonly known as:  MS CONTIN Take 1 tablet (60 mg total) by mouth every 12 (twelve) hours.   oxyCODONE 5 MG immediate release tablet Commonly known as:  Oxy IR/ROXICODONE Take 3 tablets (15 mg total) by mouth 3 (three) times daily. Pain       Disposition and follow-up:   [facility]  At the hospital follow up visit please address:  1.   --is her chest pain improving? --any further hemoptysis?  2.  Labs / imaging needed at time of follow-up:  --possibly CXR if concerned about persistent or recurrent pneumothorax  3.  Pending labs/ test needing follow-up: none  Follow-up Appointments: Follow-up Information    [facility].   Why:  HHPT Contact information: [address] [place] [address] [phone]        Delight OvensEdward B Gerhardt, MD. Call in 1 day(s).   Specialty:  Cardiothoracic Surgery Why:  Will need to call the office for a 2 week follow-up appointment Contact information: [address] [place] [address] [phone]           Hospital Course by problem list: Active Problems:   Pneumothorax on left   Spontaneous left pneumothorax:  Patient admitted with chest pain for 1 day with associated shortness of breath. She recently ran out of long term pain medicine and developed withdrawal with nausea, vomiting, and diarrhea which preceded the chest pain. On admission, CXR showed left pneumothorax; a chest tube was place in the ED. During admission, patient was eventually transitioned from suction, to water seal to removal of chest tube, with confirmation of pneumothorax resolution by daily CXRs. The chest tube was monitored by cardiothoracic surgery team. She did have a couple of episodes of coughing up dark blood that resolved. This was though to be due to bleb burst (suggestive on CXR). Her pain was managed with initially fentanyl then switched to POoxy IR and short course of toradol, and she was given GI regimen to combat constipation. She worked with PT/OT while inpatient and was recommended for HHPT - info was provided for patient at discharge  COPD:  Patient with COPD with suggestion of pulmonary bleb on xrays. She was continued on her home symbicort.   Chronic pain 2/2 fibromyalgia:  Patient on home regimen of MS Contin 60mg  q12hrs and OXY IR 20mg  TID, which was continued at discharge.   Anxiety: Patient was continued on her home Celexa 40mg  daily.  Discharge Vitals:   BP (!) 95/42 (BP Location: Right  Arm)   Pulse 97   Temp 98.3 F (36.8 C) (Oral)   Resp 18   Ht 5\' 2"  (1.575 m)   Wt 96 lb 12.5 oz (43.9 kg)   SpO2 97%   BMI 17.70 kg/m   Pertinent Labs, Studies, and Procedures:  CBC    Component Value Date/Time   WBC 5.2 10/12/2016 0324   RBC 3.55 (L) 10/12/2016 0324   HGB 10.8 (L) 10/12/2016 0324   HCT 34.0 (L) 10/12/2016 0324   PLT 192 10/12/2016 0324   MCV 95.8 10/12/2016 0324   MCH 30.4 10/12/2016 0324   MCHC 31.8  10/12/2016 0324   RDW 13.3 10/12/2016 0324   LYMPHSABS 1.0 10/06/2016 2114   MONOABS 1.7 (H) 10/06/2016 2114   EOSABS 0.0 10/06/2016 2114   BASOSABS 0.0 10/06/2016 2114   BMP Latest Ref Rng & Units 10/11/2016 10/09/2016 10/08/2016  Glucose 65 - 99 mg/dL 88 161(W103(H) 960(A100(H)  BUN 6 - 20 mg/dL 17 11 15   Creatinine 0.44 - 1.00 mg/dL 5.400.62 9.810.62 1.910.56  Sodium 135 - 145 mmol/L 137 137 136  Potassium 3.5 - 5.1 mmol/L 4.0 3.9 4.0  Chloride 101 - 111 mmol/L 102 101 105  CO2 22 - 32 mmol/L 30 28 23   Calcium 8.9 - 10.3 mg/dL 9.1 9.1 9.4   CXR 47/8/295611/04/2016: Mild left basilar atelectasis with persistent visualization of a loculated gas collection at the inferior left chest question loculated pneumothorax versus bleb.   Discharge Instructions: Discharge Instructions    Diet - low sodium heart healthy    Complete by:  As directed    Increase activity slowly    Complete by:  As directed       Signed: Nyra MarketGorica Alleene Stoy, MD 10/20/2016, 10:20 PM   Pager [phone]

## 2016-10-11 NOTE — Clinical Social Work Note (Signed)
CSW met with patient to discuss need for substance abuse treatment per unit CSW's request. Patient states she is willing to go to a residential treatment program. CSW explained that the patient will not remain in the hospital waiting for a residential treatment bed if she is medically stable. CSW will send referrals to residential treatment facilities once the patient is medically stable as the treatment facilities will not take referrals on patient that is not medically stable. The patient and patient's sister understands that bed availability may be an issue. Unit CSW will also be looking into possible SNF placement in the event the patient is unable to return home at discharge.  Liz Beach MSW, [place], [place], [phone]

## 2016-10-11 NOTE — Progress Notes (Addendum)
      [address]       [place], [id number]             [phone]           Subjective: Patient states gets an occasional "mucous darkened sputum".   Objective: Vital signs in last 24 hours: Temp:  [97.7 F (36.5 C)-98.9 F (37.2 C)] 98 F (36.7 C) (11/03 0700) Pulse Rate:  [80-95] 93 (11/03 0700) Cardiac Rhythm: Normal sinus rhythm (11/03 0758) Resp:  [12-18] 17 (11/03 0700) BP: (98-115)/(51-66) 101/59 (11/03 0700) SpO2:  [92 %-99 %] 96 % (11/03 0848) Weight:  [96 lb 12.5 oz (43.9 kg)] 96 lb 12.5 oz (43.9 kg) (11/03 0352)      Intake/Output from previous day: 11/02 0701 - 11/03 0700 In: 1100 [P.O.:1080; I.V.:20] Out: 3 [Urine:2; Stool:1]   Physical Exam:  Cardiovascular: RRR Pulmonary: Clear to auscultation on left and mostly clear on right-slightly diminished basilar breath sound Wounds: Dressing is clean and dry.   Chest Tube: to suction, no air leak  Lab Results: CBC: Recent Labs  10/09/16 0422 10/11/16 0553  WBC 9.0 6.5  HGB 12.3 11.3*  HCT 39.2 35.3*  PLT 171 176   BMET:  Recent Labs  10/09/16 0422 10/11/16 0553  NA 137 137  K 3.9 4.0  CL 101 102  CO2 28 30  GLUCOSE 103* 88  BUN 11 17  CREATININE 0.62 0.62  CALCIUM 9.1 9.1    PT/INR: No results for input(s): LABPROT, INR in the last 72 hours. ABG:  INR: Will add last result for INR, ABG once components are confirmed Will add last 4 CBG results once components are confirmed  Assessment/Plan:  1. CV - SR in the 90's. 2.  Pulmonary - continue Symbicort. Chest tube to suction. No air leak. CXR this am shows improvement in left pneumothorax, stable left base bleb/ pneumothorax. Will place chest tube to water seal. Check CXR in am. 3. ABL anemia-H and H 11.3 and 35.3 this am. 4. Mangaement per medicine  Delson Dulworth MPA-C 10/11/2016,10:38 AM

## 2016-10-11 NOTE — Progress Notes (Signed)
   Subjective: Patient had one episode of dark hemoptysis this AM. She states her breathing and mobility continue to improve. She still has pain but endorsed the toradol has helped yesterday in conjunction with oxy IR. Patient endorses no BM and has declined senna each night because she does not have abdominal discomfort from constipation currently. We discussed importance of regular bowel movements due to opioid use and more importantly to reduce straining.   Objective:  Vital signs in last 24 hours: Vitals:   10/11/16 0700 10/11/16 0848 10/11/16 1100 10/11/16 1500  BP: (!) 101/59  108/69 118/62  Pulse: 93  94 92  Resp: 17  18 15   Temp: 98 F (36.7 C)  98.3 F (36.8 C) 98.4 F (36.9 C)  TempSrc: Oral  Oral Axillary  SpO2: 93% 96% 97% 90%  Weight:      Height:       Constitutional: NAD, lying in bed CV: RRR, no murmurs, rubs or gallops appreciated, no LE edema, pulses intact Resp: no increased work of breathing, CTAB, no crackles or wheezing appreciated Abd: soft, +BS, NDNT MSK: Chest tube in place on R lateral chest - dry, clean bandages, moves all extremities without difficulty.  Assessment/Plan:  Active Problems:   Pneumothorax on left  Spontaneous left pneumothorax: AM CXR consistent with decreased LUL pneumothorax and either resolving LLL pneumothorax vs bleb. Dark hemoptysis likely due to bleb burst 2/2 COPD and trauma. --f/u CTVS recc's, continue chest tube - water seal --transfer to floor --MS Contin 60mg  BID, Oxy IR 10mg  q4hr, Toradol q8 PRN x 6 doses (recieved for one day, will keep <5 days total) --continue monitoring for bright red blood in sputum  Chronic pain 2/2 fibromyalgia:  Home regimen of MS Contin 60mg  q12 and Oxy IR 20 TID; working on adequately managing her pain in setting of chest tube.  --MS Contin 60mg  BID, Oxy IR 10mg  q4hr, Toradol q8 PRN x 6 doses (recieved for one day, will keep <5 days total) --Patient agreed to senna - increased to BID  COPD:    On symbicort at home. Continue inpatient.  Anxiety:  Patient on Celexa 40mg  daily --continue home med  Dispo: Anticipated discharge in approximately 1-2 day(s).   Nyra MarketGorica Callum Wolf, MD 10/11/2016, 4:41 PM Pager [phone]

## 2016-10-11 NOTE — Care Management Note (Addendum)
Case Management Note  Patient Details  Name: Michele BariCarla Brandow MRN: [id number] Date of Birth: 07/29/1966  Subjective/Objective:   Patient with l ptx, chest tube to suction, per pt eval rec hhpt and rolling walker, patient states she has a rolling walker at home that is her husband's but she can use it , she also has a quad cane.  She states she would like [facility] for HHPT because they were suppose to work with her spouse also.  [facility] and pcp is Colgate PalmoliveDavid Edleman.  She states her two sisters will be coming to help her when she goes home, so she will have assistance.   NCM spoke with [facility]aura [facility] she gave me Thyra BreedSusan Heaton RN phone [phone].  She would be the one to put in consult for HHPT.  Also she gave me Fredrik CoveAnn Adams [phone] or [place] [phone].  The clinic phone is [phone].     NCM spoke with Fredrik CoveAnn Adams at [phone] she states to [facility] the [facility] and she will get the authorization to them to start on Monday.  NCM called Elnita Maxwellheryl with [facility] to relay this message and she states she will get patient set for the HHPT.   NCM will need to fax dc summary to Avelina Laineavid Edleman at [phone].           Action/Plan:   Expected Discharge Date:                  Expected Discharge Plan:  Home w Home Health Services  In-House Referral:     Discharge planning Services  CM Consult  Post Acute Care Choice:    Choice offered to:     DME Arranged:    DME Agency:     HH Arranged:    HH Agency:     Status of Service:  In process, will continue to follow  If discussed at Long Length of Stay Meetings, dates discussed:    Additional Comments:  Leone Havenaylor, Delainey Winstanley Clinton, RN 10/11/2016, 12:32 PM

## 2016-10-11 NOTE — Progress Notes (Signed)
Although on soft diet, graham crackers ok for patient to eat per MD.

## 2016-10-11 NOTE — Progress Notes (Signed)
Offered to get patient up to chair, she states she would like to get up to eat breakfast but does not want to get up now.  Will continue to encourage and monitor patient.

## 2016-10-11 NOTE — Progress Notes (Signed)
Physical Therapy Treatment Patient Details Name: Michele Lucas MRN: [id number] DOB: 06/26/1966 Today's Date: 10/11/2016    History of Present Illness Patient is a 50 y/o female with hx of PTSD, fibromyalgia, COPD, endometrosis, chronic pain back presents with left PTX s/p chest tube placement.     PT Comments    Patient progressing well towards PT goals. Improved ambulation distance with encouragement. Tolerated ambulating on RA while maintaining Sp02 >90%. Encouraged increasing activity and mobility while in hospital and to ambulate with nursing daily. Will continue to follow.   Follow Up Recommendations  Home health PT;Supervision for mobility/OOB     Equipment Recommendations  Rolling walker with 5" wheels    Recommendations for Other Services       Precautions / Restrictions Precautions Precautions: Fall Precaution Comments: chest tube Restrictions Weight Bearing Restrictions: No    Mobility  Bed Mobility Overal bed mobility: Needs Assistance Bed Mobility: Rolling;Sidelying to Sit Rolling: Supervision Sidelying to sit: Supervision;HOB elevated       General bed mobility comments: Cues for log roll technique. Use of rail for support. Managed lines/chest tube.  Transfers Overall transfer level: Needs assistance Equipment used: Rolling walker (2 wheeled);None Transfers: Sit to/from UGI CorporationStand;Stand Pivot Transfers Sit to Stand: Min assist;Min guard Stand pivot transfers: Min assist       General transfer comment: VC's for hand placement. SPT bed to chair Min A for pt to eat breakfast. Stood from chair x1.  Ambulation/Gait Ambulation/Gait assistance: Min guard Ambulation Distance (Feet): 165 Feet Assistive device: Rolling walker (2 wheeled) Gait Pattern/deviations: Step-through pattern;Decreased stride length;Trunk flexed Gait velocity: decreased Gait velocity interpretation: <1.8 ft/sec, indicative of risk for recurrent falls General Gait Details: Guarded gait  with very short step lengths; Sp02 >90% on RA. HR in low 100s.   Stairs            Wheelchair Mobility    Modified Rankin (Stroke Patients Only)       Balance Overall balance assessment: Needs assistance Sitting-balance support: Feet supported;No upper extremity supported Sitting balance-Leahy Scale: Fair     Standing balance support: No upper extremity supported Standing balance-Leahy Scale: Fair Standing balance comment: Able to stand statically without UE support.                     Cognition Arousal/Alertness: Awake/alert Behavior During Therapy: WFL for tasks assessed/performed Overall Cognitive Status: Within Functional Limits for tasks assessed                      Exercises      General Comments        Pertinent Vitals/Pain Pain Assessment: Faces Faces Pain Scale: Hurts even more Pain Location: left chest Pain Descriptors / Indicators: Guarding;Sore Pain Intervention(s): Monitored during session;Premedicated before session    Home Living                      Prior Function            PT Goals (current goals can now be found in the care plan section) Progress towards PT goals: Progressing toward goals    Frequency    Min 3X/week      PT Plan Current plan remains appropriate    Co-evaluation             End of Session   Activity Tolerance: Patient tolerated treatment well Patient left: in bed;with nursing/sitter in room (sitting EOB with nurse tech in room for  bath)     Time: 1019 [phone] PT Time Calculation (min) (ACUTE ONLY): 21 min  Charges:  $Gait Training: 8-22 mins $Therapeutic Activity: 8-22 mins                    G Codes:      Hobart Marte A Kmya Placide 10/11/2016, 11:08 AM  Mylo RedShauna Latron Ribas, PT, DPT [phone]

## 2016-10-12 ENCOUNTER — Inpatient Hospital Stay (HOSPITAL_COMMUNITY)

## 2016-10-12 DIAGNOSIS — Z9689 Presence of other specified functional implants: Secondary | ICD-10-CM

## 2016-10-12 DIAGNOSIS — K5900 Constipation, unspecified: Secondary | ICD-10-CM

## 2016-10-12 LAB — CBC
HCT: 34 % — ABNORMAL LOW (ref 36.0–46.0)
Hemoglobin: 10.8 g/dL — ABNORMAL LOW (ref 12.0–15.0)
MCH: 30.4 pg (ref 26.0–34.0)
MCHC: 31.8 g/dL (ref 30.0–36.0)
MCV: 95.8 fL (ref 78.0–100.0)
PLATELETS: 192 10*3/uL (ref 150–400)
RBC: 3.55 MIL/uL — ABNORMAL LOW (ref 3.87–5.11)
RDW: 13.3 % (ref 11.5–15.5)
WBC: 5.2 10*3/uL (ref 4.0–10.5)

## 2016-10-12 NOTE — Progress Notes (Signed)
10/12/2016 1630 Chest tube removed per MD order and protocol.  Pt tolerated well. Kathryne HitchAllen, Ja Pistole C

## 2016-10-12 NOTE — Progress Notes (Signed)
   Subjective:   No acute events overnight. Pt feels better and is excited to know that her pneumothorax had resolved and that the chest tube was coming out today.   Objective:  Vital signs in last 24 hours: Vitals:   10/11/16 2015 10/11/16 2242 10/12/16 0509 10/12/16 0829  BP: (!) 110/50  (!) 98/48   Pulse: 76  74   Resp: 20  20   Temp: 97.7 F (36.5 C)  98.1 F (36.7 C)   TempSrc: Oral  Oral   SpO2: 94% 95% 94% 91%  Weight:      Height:       Constitutional: NAD, lying in bed CV: RRR, no murmurs, rubs or gallops appreciated, no LE edema, pulses intact Resp: no increased work of breathing, CTAB, no crackles or wheezing appreciated Abd: soft, +BS, NDNT MSK: Chest tube in place on R lateral chest - dry, clean bandages, moves all extremities without difficulty.  Assessment/Plan:  Active Problems:   Pneumothorax on left  Spontaneous left pneumothorax: AM CXR shows pneumothorax resolved  -per CVTS, chest tube likely coming out today  --MS Contin 60mg  BID, Oxy IR 10mg  q4hr, Toradol q8 PRN x 6 doses  --continue monitoring for bright red blood in sputum  Chronic pain 2/2 fibromyalgia:  Home regimen of MS Contin 60mg  q12 and Oxy IR 20 TID; working on adequately managing her pain in setting of chest tube.  --MS Contin 60mg  BID, Oxy IR 10mg  q4hr, Toradol q8 PRN   Constipation: -senokot BID  COPD:  On symbicort at home. Continue inpatient.  Anxiety:  Patient on Celexa 40mg  daily --continue home med  Dispo: Anticipated discharge in approximately 1-2 day(s).   Michele LeverParth Willene Holian, MD 10/12/2016, 9:21 AM Pager [phone]

## 2016-10-12 NOTE — Progress Notes (Addendum)
      [address]       [place] [id number]             [phone]         Subjective: Feeling like she can breath better this morning.  Objective: Vital signs in last 24 hours: Temp:  [97.7 F (36.5 C)-98.5 F (36.9 C)] 98.1 F (36.7 C) (11/04 0509) Pulse Rate:  [74-94] 74 (11/04 0509) Cardiac Rhythm: Normal sinus rhythm (11/04 0700) Resp:  [15-20] 20 (11/04 0509) BP: (98-119)/(48-69) 98/48 (11/04 0509) SpO2:  [90 %-97 %] 94 % (11/04 0509)     Intake/Output from previous day: 11/03 0701 - 11/04 0700 In: 960 [P.O.:960] Out: 250 [Urine:250] Intake/Output this shift: No intake/output data recorded.  General appearance: alert, cooperative and no distress Heart: regular rate and rhythm, S1, S2 normal, no murmur, click, rub or gallop Lungs: clear to auscultation bilaterally Extremities: extremities normal, atraumatic, no cyanosis or edema Wound: clean and dry  Lab Results:  Recent Labs  10/11/16 0553 10/12/16 0324  WBC 6.5 5.2  HGB 11.3* 10.8*  HCT 35.3* 34.0*  PLT 176 192   BMET:  Recent Labs  10/11/16 0553  NA 137  K 4.0  CL 102  CO2 30  GLUCOSE 88  BUN 17  CREATININE 0.62  CALCIUM 9.1    PT/INR: No results for input(s): LABPROT, INR in the last 72 hours. ABG    Component Value Date/Time   TCO2 15 09/18/2009 0923   CBG (last 3)  No results for input(s): GLUCAP in the last 72 hours.  Assessment/Plan: S/P chest tube placement for pneumothorax  1. CV- NSR, BP stable 2. Pulm-chest xray this morning shows complete resolution of left pneumothorax. CT on water seal this morning without leak. Can likely discontinue today 3. H and H stable today at 10.8/34.0 4. Management per medicine  Plan: Likely remove chest tube today with a repeat chest xray in the morning. Will discuss with attending physician.      LOS: 6 days    Michele Lucas 10/12/2016   Chart reviewed, patient examined, agree with above. CXR ok and no air leak  so will remove the tube.

## 2016-10-13 ENCOUNTER — Inpatient Hospital Stay (HOSPITAL_COMMUNITY)

## 2016-10-13 MED ORDER — MORPHINE SULFATE ER 60 MG PO TBCR: 60.0000 mg | EXTENDED_RELEASE_TABLET | Freq: Two times a day (BID) | ORAL | 0 refills | Status: DC

## 2016-10-13 MED ORDER — OXYCODONE HCL 5 MG PO TABS: 15.0000 mg | ORAL_TABLET | Freq: Three times a day (TID) | ORAL | 0 refills | Status: DC

## 2016-10-13 NOTE — Discharge Instructions (Signed)
It was a pleasure taking care of you Please call the surgeon's office for a 2 week follow up appointment Please also follow up with your PCP/ pain management clinic.

## 2016-10-13 NOTE — Progress Notes (Addendum)
      [address]       Michele Lucas,[place] [id number]             [phone]         Subjective: She is overall feeling better. She has some pain below her left breast which she says feels like fluid.   Objective: Vital signs in last 24 hours: Temp:  [98.2 F (36.8 C)-98.3 F (36.8 C)] 98.3 F (36.8 C) (11/05 0701) Pulse Rate:  [77-80] 77 (11/05 0701) Cardiac Rhythm: Normal sinus rhythm (11/05 0700) Resp:  [18] 18 (11/05 0701) BP: (104-107)/(52-55) 104/52 (11/05 0701) SpO2:  [91 %-97 %] 93 % (11/05 0736)     Intake/Output from previous day: 11/04 0701 - 11/05 0700 In: 1203 [P.O.:1200; I.V.:3] Out: -  Intake/Output this shift: No intake/output data recorded.  General appearance: alert, cooperative and no distress Heart: regular rate and rhythm, S1, S2 normal, no murmur, click, rub or gallop Lungs: clear to auscultation bilaterally and fluid sound below left breast Extremities: extremities normal, atraumatic, no cyanosis or edema Wound: clean and dry  Lab Results:  Recent Labs  10/11/16 0553 10/12/16 0324  WBC 6.5 5.2  HGB 11.3* 10.8*  HCT 35.3* 34.0*  PLT 176 192   BMET:  Recent Labs  10/11/16 0553  NA 137  K 4.0  CL 102  CO2 30  GLUCOSE 88  BUN 17  CREATININE 0.62  CALCIUM 9.1    PT/INR: No results for input(s): LABPROT, INR in the last 72 hours. ABG    Component Value Date/Time   TCO2 15 09/18/2009 0923   CBG (last 3)  No results for input(s): GLUCAP in the last 72 hours.  Assessment/Plan: S/P chest tube placement for pneumothorax  1. CV- NSR, BP stable 2. Pulm-chest xray this morning showed Mild LEFT basilar atelectasis with persistent visualization of a loculated gas collection at the inferior LEFT chest question loculated pneumothorax versus bleb. Patient on room air with good oxygen saturation. 3. H and H stable at 10.8/34.0 4. Management per medicine  Plan: Anticipated discharge today or tomorrow per medicine. Loculated  air bubble vs bleb on CXR.     LOS: 7 days    Michele Lucas 10/13/2016   Chart reviewed, patient examined, agree with above. CXR looks fine. She can go home per medicine team.

## 2016-10-13 NOTE — Progress Notes (Signed)
AC spoke with pt - pt agreeable to discharge. Spoke with pt daughter on the phone - daughter will be at hospital at 6pm to pick up pt.  Pt given discharge paperwork including paper prescriptions for MS Contin and Oxycodone. Pt educated on follow up appointments, medications. Pt discharged with list of outpatient rehab facilities.   IV removed. Telemetry removed, CCMD notified.   Leonidas Rombergaitlin S Bumbledare, RN

## 2016-10-13 NOTE — Progress Notes (Signed)
CM received call from RN pt is to be discharged.  CM notified [facility] DME rep, Reggie to please deliver the rolling walker to room prior to discharge. Per previous CM directive, this CM faxed DC summary to Avelina Laineavid Edleman [phone].  No other CM needs were communicated.

## 2016-10-13 NOTE — Progress Notes (Signed)
CSW called [facility] to see if they had any bed available for patient. [facility] currently does not have any beds and is not accepting patients to their ER. Patient spoke with crisis line representative regarding needs. CSW updated family and confirmed [facility]. CSW gave patient list of residential treatment facilities for patient to follow up with.   Patients niece, Colin MuldersBrianna, will be picking patient up for discharge.   Stacy GardnerErin Wardell Pokorski, LCSWA Clinical Social Worker [phone]

## 2016-10-13 NOTE — Progress Notes (Addendum)
Pt refusing discharge. SW gave pt information on rehab facilities and instructions to call Monday. Pt states she "feels safer" in the hospital and refuses to call her ride or give RN the phone number.   Primary team made aware. [facility] AC made aware - will come see pt.  Leonidas Rombergaitlin S Bumbledare, RN

## 2016-10-13 NOTE — Progress Notes (Signed)
   Subjective:   No acute events overnight. Pt feels better. Denies dyspnea, pain, n/v. Chest tube removed yesterday  Looking forward to be discharged  Objective:  Vital signs in last 24 hours: Vitals:   10/12/16 2043 10/12/16 2052 10/13/16 0701 10/13/16 0736  BP:  (!) 107/55 (!) 104/52   Pulse:  77 77   Resp:  18 18   Temp:  98.2 F (36.8 C) 98.3 F (36.8 C)   TempSrc:  Oral Oral   SpO2: 95% 97% 92% 93%  Weight:      Height:       Constitutional: NAD, lying in bed CV: RRR, no murmurs, rubs or gallops appreciated, no LE edema, pulses intact Resp: no increased work of breathing, CTAB, no crackles or wheezing appreciated Abd: soft, +BS, NDNT MSK: chest tube removed yesterday, bandage present. Mildly tender to palpation. - dry, clean bandages, moves all extremities without difficulty.  Assessment/Plan:  Active Problems:   Pneumothorax on left  Spontaneous left pneumothorax: CVTS following, and had chest tube removed yesterday. Had CXR today which showed some left basilar atelectasis. Overall we independently reviewed the CXR and compared it to yesterday, and her pneumothorax is resolved.   -discharge home   Chronic pain 2/2 fibromyalgia:  Home regimen of MS Contin 60mg  q12 and Oxy IR 20 TID; working on adequately managing her pain in setting of chest tube.  --MS Contin 60mg  BID, Oxy IR 10mg  q4hr, Toradol q8 PRN   Constipation: -senokot BID  COPD:  On symbicort at home. Continue inpatient.  Anxiety:  Patient on Celexa 40mg  daily --continue home med  Dispo: Anticipated discharge today   Deneise LeverParth Harlynn Kimbell, MD 10/13/2016, 11:56 AM Pager [phone]

## 2016-10-13 NOTE — Discharge Summary (Signed)
Medicine attending discharge note: I personally examined this patient on the day of discharge together with resident physician Dr. Deneise LeverParth Saraiya and I attests to the accuracy of his discharge evaluation and plan which we discussed together.  Clinical summary: 50 year old woman admitted to the hospital on 10/06/2016 when she presented with acute onset of left sided chest pain. She is a heavy smoker and has underlying obstructive airway disease. Pain occurred after a 3 day history of coughing and vomiting. On initial exam she was in acute distress. Lungs overall clear but poor respiratory effort. Oxygen saturation variable between 93-97 percent on room air. A chest radiograph showed a large left pneumothorax with partial lung collapse.  Hospital course: Urgent consultation with cardiovascular surgery was obtained in a left chest tube was placed. She had some initial low-grade hemoptysis which resolved. Lung reexpanded. Chest tube was removed on November 4. Chest radiograph remains stable at 24 hours with complete resolution of the pneumothorax. 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 It appears that the patient probably ruptured a bulla to account for the pneumothorax related to coughing and vomiting.  No other acute issues determined during this admission. She has chronic fibromyalgia. She is on chronic narcotic analgesics.  Disposition: Condition stable at time of discharge. There were no complications.

## 2016-10-13 NOTE — Care Management (Signed)
facesheet for fax Michele Lucas, Michele Lucas #[id number] (CSN: [id number]) (50 y.o. F) (Adm: 10/06/16)  [id number]  PCP   NO PCP PER PATIENT  Demographics  Comment    Last edited by  on at   Address: Home Phone: Work Phone: Mobile Phone:  [address]  [place] [address]   [phone]  SSN: Insurance: Marital Status: Religion:  [id number] CHAMPVA Married None  Patient Ethnicity & Race   Ethnic Group Patient Race  Not Hispanic or Latino Black or African American  Documents Filed to Patient   Power of Attorney Living Will Clinical Unknown Study Attachment Consent Form ABN Waiver After Visit Summary Lab Result Scan Code Status MyChart Status Advance Care Planning  Not on File Not on File Not on File Not on File Not on File Not on File Mosetta PigeonFiled Filed FULL [Updated on 10/07/16 0159] Pending Jump to the Activity  Admission Information   Attending Provider Admitting Provider Admission Type Admission Date/Time  Gust RungErik C Hoffman, DO Earl LagosNischal Narendra, MD Emergency 10/06/16 2102  Discharge Date Hospital Service Auth/Cert Status Service Area   Internal Medicine Incomplete [facility] SERVICE AREA  Unit Room/Bed Admission Status   MC-2W CARDIAC     2W18C/2W18C-01 Admission (Confirmed)   Hospital Account   Name Acct ID Class Status Primary Coverage  Michele Lucas, Michele Lucas [id number] Inpatient Open CHAMPVA - CHAMPVA      Guarantor Account (for Hospital Account [id number])   Name Relation to Pt Service Area Active? Acct Type  Michele Barioleman, Epifania Self CHSA Yes Personal/Family  Address Phone    [address] [place] [address] [phone])        Coverage Information (for Hospital Account [id number])   F/O Payor/Plan Precert #  CHAMPVA/CHAMPVA   Subscriber Subscriber #  Michele Lucas, Michele Lucas [id number]  Address Phone  [id number] [place], [place] [id number] [phone]

## 2016-10-15 NOTE — Progress Notes (Signed)
Patient left her RW in her room. Contacted pt to make aware. States she does not need and wants credit back. She has two RW at home. Contacted [facility] DME, Brad to pick up at nurse's station to credit back to pt's account. Isidoro DonningAlesia Moriyah Byington RN CCM Case Mgmt phone [phone]

## 2017-06-24 ENCOUNTER — Encounter (HOSPITAL_COMMUNITY): Payer: Self-pay | Admitting: Family Medicine

## 2017-06-24 ENCOUNTER — Emergency Department (HOSPITAL_COMMUNITY)

## 2017-06-24 DIAGNOSIS — Z79899 Other long term (current) drug therapy: Secondary | ICD-10-CM | POA: Diagnosis not present

## 2017-06-24 DIAGNOSIS — R079 Chest pain, unspecified: Secondary | ICD-10-CM | POA: Insufficient documentation

## 2017-06-24 DIAGNOSIS — J449 Chronic obstructive pulmonary disease, unspecified: Secondary | ICD-10-CM | POA: Diagnosis not present

## 2017-06-24 DIAGNOSIS — F17210 Nicotine dependence, cigarettes, uncomplicated: Secondary | ICD-10-CM | POA: Diagnosis not present

## 2017-06-24 NOTE — ED Triage Notes (Signed)
Patient reports is experiencing left sided chest pain underneath her breast. Pain started today while she was getting out of bed to ambulate to the bathroom. Denies pain radiating but reports she has SHOB, lightheadedness, diaphoresis, and weakness as associated symptoms. Patient talking in full sentences with no distress noted.

## 2017-06-25 ENCOUNTER — Emergency Department (HOSPITAL_COMMUNITY)
Admission: EM | Admit: 2017-06-25 | Discharge: 2017-06-25 | Disposition: A | Discharge status: Home / Self Care | Attending: Emergency Medicine | Admitting: Emergency Medicine

## 2017-06-25 DIAGNOSIS — R079 Chest pain, unspecified: Secondary | ICD-10-CM

## 2017-06-25 HISTORY — DX: Emphysema, unspecified: J43.9

## 2017-06-25 LAB — CBC
HEMATOCRIT: 40.9 % (ref 36.0–46.0)
Hemoglobin: 13.3 g/dL (ref 12.0–15.0)
MCH: 31.3 pg (ref 26.0–34.0)
MCHC: 32.5 g/dL (ref 30.0–36.0)
MCV: 96.2 fL (ref 78.0–100.0)
PLATELETS: 158 10*3/uL (ref 150–400)
RBC: 4.25 MIL/uL (ref 3.87–5.11)
RDW: 14.1 % (ref 11.5–15.5)
WBC: 5.8 10*3/uL (ref 4.0–10.5)

## 2017-06-25 LAB — BASIC METABOLIC PANEL
Anion gap: 5 (ref 5–15)
BUN: 15 mg/dL (ref 6–20)
CHLORIDE: 106 mmol/L (ref 101–111)
CO2: 31 mmol/L (ref 22–32)
CREATININE: 0.77 mg/dL (ref 0.44–1.00)
Calcium: 9.7 mg/dL (ref 8.9–10.3)
GFR calc Af Amer: >60 mL/min (ref >60)
GFR calc non Af Amer: >60 mL/min (ref >60)
GLUCOSE: 69 mg/dL (ref 65–99)
POTASSIUM: 4.3 mmol/L (ref 3.5–5.1)
SODIUM: 142 mmol/L (ref 135–145)

## 2017-06-25 LAB — POCT I-STAT TROPONIN I: Troponin i, poc: 0.01 ng/mL (ref 0.00–0.08)

## 2017-06-25 MED ORDER — OMEPRAZOLE 20 MG PO CPDR: 20.0000 mg | DELAYED_RELEASE_CAPSULE | Freq: Every day | ORAL | 0 refills | Status: DC

## 2017-06-25 MED ORDER — SUCRALFATE 1 GM/10ML PO SUSP
1.0000 g | Freq: Three times a day (TID) | ORAL | 0 refills | Status: DC
Start: 2017-06-25 — End: 2017-11-05

## 2017-06-25 NOTE — ED Provider Notes (Signed)
[facility]-EMERGENCY DEPT Provider Note   CSN: [id number] Arrival date & time: 06/24/17  2317     History   Chief Complaint Chief Complaint  Patient presents with  . Chest Pain    HPI Michele Lucas is a 51 y.o. female.  Patient with history of COPD, chronic pain, fibromyalgia, anxiety, presents with complaint of chest pain that is left sided, sharp, that started yesterday morning (06/24/17) and has been constant. Later in the day she started having left arm pain. No modifying factors to the chest pain. Arm is more painful when moving in any direction. No SOB, cough, fever, diaphoresis. She has not taken anything for symptoms.    The history is provided by the patient. No language interpreter was used.  Chest Pain   Pertinent negatives include no diaphoresis, no fever and no nausea.    Past Medical History:  Diagnosis Date  . Anxiety   . Chronic back pain   . COPD, mild (HCC)   . Emphysema lung (HCC)   . Endometriosis   . Fibromyalgia   . Neuropathic pain   . PTSD (post-traumatic stress disorder)     Patient Active Problem List   Diagnosis Date Noted  . Pneumothorax on left 10/06/2016  . Chronic pain syndrome 04/02/2015  . Generalized anxiety disorder 04/02/2015  . Hypokalemia 04/02/2015  . Normocytic anemia 04/02/2015  . Abnormal urinalysis 04/02/2015  . SOB (shortness of breath)   . CAP (community acquired pneumonia) 04/01/2015  . COPD (chronic obstructive pulmonary disease) (HCC) 04/01/2015    Past Surgical History:  Procedure Laterality Date  . CESAREAN SECTION    . copolscopy    . TONSILLECTOMY    . TUBAL LIGATION      OB History    No data available       Home Medications    Prior to Admission medications   Medication Sig Start Date End Date Taking? Authorizing Provider  budesonide-formoterol (SYMBICORT) 80-4.5 MCG/ACT inhaler Inhale 2 puffs into the lungs 2 (two) times daily as needed (shortness of breath).     [provider]  citalopram  (CELEXA) 40 MG tablet Take 40 mg by mouth daily.    [provider]  fluticasone (FLONASE) 50 MCG/ACT nasal spray Place 2 sprays into the nose daily as needed for allergies.     [provider]  medroxyPROGESTERone (DEPO-PROVERA) 150 MG/ML injection Inject 150 mg into the muscle every 3 (three) months.    [provider]  morphine (MS CONTIN) 60 MG 12 hr tablet Take 1 tablet (60 mg total) by mouth every 12 (twelve) hours. 10/13/16   Deneise Lever, MD  oxyCODONE (OXY IR/ROXICODONE) 5 MG immediate release tablet Take 3 tablets (15 mg total) by mouth 3 (three) times daily. Pain 10/13/16   Deneise Lever, MD    Family History Family History  Problem Relation Age of Onset  . Diabetes type II Neg Hx   . Stroke Neg Hx     Social History Social History  Substance Use Topics  . Smoking status: Current Every Day Smoker    Packs/day: 1.00    Types: Cigarettes  . Smokeless tobacco: Current User  . Alcohol use No     Allergies   Guaifenesin & derivatives; Ibuprofen; Tylenol [acetaminophen]; Ampicillin; and Clindamycin/lincomycin   Review of Systems Review of Systems  Constitutional: Negative for chills, diaphoresis and fever.  HENT: Negative.   Respiratory: Negative.   Cardiovascular: Positive for chest pain.  Gastrointestinal: Negative.  Negative for nausea.  Musculoskeletal: Negative.   Skin: Negative.   Neurological: Negative.      Physical Exam Updated Vital Signs Ht 5\' 2"  (1.575 m)   Wt 40.8 kg (90 lb)   BMI 16.46 kg/m   Physical Exam  Constitutional: She is oriented to person, place, and time. She appears well-developed and well-nourished.  HENT:  Head: Normocephalic.  Neck: Normal range of motion. Neck supple.  Cardiovascular: Normal rate, regular rhythm and intact distal pulses.   Pulmonary/Chest: Effort normal and breath sounds normal. She has no wheezes. She has no rales. She exhibits tenderness.  Abdominal: Soft. Bowel sounds are normal.  There is no tenderness. There is no rebound and no guarding.  Musculoskeletal: Normal range of motion. She exhibits no edema.  Left arm and shoulder unremarkable in appearance, no swelling or discoloration. There is pain with movement. No bony deformity.  Neurological: She is alert and oriented to person, place, and time.  Skin: Skin is warm and dry. No rash noted.  Psychiatric: She has a normal mood and affect.     ED Treatments / Results  Labs (all labs ordered are listed, but only abnormal results are displayed) Labs Reviewed  BASIC METABOLIC PANEL  CBC  I-STAT TROPOININ, ED  POCT I-STAT TROPONIN I   Results for orders placed or performed during the hospital encounter of 06/25/17  Basic metabolic panel  Result Value Ref Range   Sodium 142 135 - 145 mmol/L   Potassium 4.3 3.5 - 5.1 mmol/L   Chloride 106 101 - 111 mmol/L   CO2 31 22 - 32 mmol/L   Glucose, Bld 69 65 - 99 mg/dL   BUN 15 6 - 20 mg/dL   Creatinine, Ser 1.610.77 0.44 - 1.00 mg/dL   Calcium 9.7 8.9 - 09.610.3 mg/dL   GFR calc non Af Amer >60 >60 mL/min   GFR calc Af Amer >60 >60 mL/min   Anion gap 5 5 - 15  CBC  Result Value Ref Range   WBC 5.8 4.0 - 10.5 K/uL   RBC 4.25 3.87 - 5.11 MIL/uL   Hemoglobin 13.3 12.0 - 15.0 g/dL   HCT 04.540.9 40.936.0 - 81.146.0 %   MCV 96.2 78.0 - 100.0 fL   MCH 31.3 26.0 - 34.0 pg   MCHC 32.5 30.0 - 36.0 g/dL   RDW 91.414.1 78.211.5 - 95.615.5 %   Platelets 158 150 - 400 K/uL  POCT i-Stat troponin I  Result Value Ref Range   Troponin i, poc 0.01 0.00 - 0.08 ng/mL   Comment 3             EKG  EKG Interpretation None       Radiology Dg Chest 2 View  Result Date: 06/25/2017 CLINICAL DATA:  Chest pain beneath the left breast radiating to left arm times 24 hours. Dyspnea. EXAM: CHEST  2 VIEW COMPARISON:  10/13/2016 FINDINGS: Emphysematous hyperinflation of the lungs consistent with COPD. Heart and mediastinal contours are stable. No pneumothorax or pulmonary consolidations. No acute nor suspicious  osseous abnormality. IMPRESSION: Findings consistent with COPD. Electronically Signed   By: Tollie Ethavid  Kwon M.D.   On: 06/25/2017 00:45    Procedures Procedures (including critical care time)  Medications Ordered in ED Medications - No data to display   Initial Impression / Assessment and Plan / ED Course  I have reviewed the triage vital signs and the nursing notes.  Pertinent labs & imaging results that were available during my care of the patient were reviewed by  me and considered in my medical decision making (see chart for details).     Patient with history of COPD, continuous smoker, presents with chest pain atypical for cardiac pain. Labs are negative, including troponin. EKG without ischemia.   Feel she is stable for discharge home and PCP follow up.   Final Clinical Impressions(s) / ED Diagnoses   Final diagnoses:  None   1. Nonspecific chest pain 2. Musculoskeletal pain, left shoulder  New Prescriptions New Prescriptions   No medications on file     Danne Harbor 06/25/17 Merrily Brittle, MD 06/27/17 [phone]

## 2017-11-05 ENCOUNTER — Encounter (HOSPITAL_COMMUNITY): Payer: Self-pay | Admitting: Emergency Medicine

## 2017-11-05 ENCOUNTER — Emergency Department (HOSPITAL_COMMUNITY)
Admission: EM | Admit: 2017-11-05 | Discharge: 2017-11-06 | Disposition: A | Discharge status: Home / Self Care | Payer: Medicare Other | Attending: Emergency Medicine | Admitting: Emergency Medicine

## 2017-11-05 ENCOUNTER — Emergency Department (HOSPITAL_COMMUNITY): Payer: Medicare Other

## 2017-11-05 DIAGNOSIS — Z79899 Other long term (current) drug therapy: Secondary | ICD-10-CM | POA: Insufficient documentation

## 2017-11-05 DIAGNOSIS — H6121 Impacted cerumen, right ear: Secondary | ICD-10-CM | POA: Diagnosis not present

## 2017-11-05 DIAGNOSIS — R51 Headache: Secondary | ICD-10-CM | POA: Insufficient documentation

## 2017-11-05 DIAGNOSIS — J449 Chronic obstructive pulmonary disease, unspecified: Secondary | ICD-10-CM | POA: Diagnosis not present

## 2017-11-05 DIAGNOSIS — F17210 Nicotine dependence, cigarettes, uncomplicated: Secondary | ICD-10-CM | POA: Diagnosis not present

## 2017-11-05 DIAGNOSIS — R519 Headache, unspecified: Secondary | ICD-10-CM

## 2017-11-05 NOTE — ED Notes (Signed)
ED Provider at bedside. 

## 2017-11-05 NOTE — ED Provider Notes (Signed)
[facility]-EMERGENCY DEPT Provider Note   CSN: [id number] Arrival date & time: 11/05/17  1816     History   Chief Complaint Chief Complaint  Patient presents with  . Headache  . right ear clogged    HPI Michele Lucas is a 51 y.o. female.  Patient presents to the emergency department with a chief complaint of headache.  She states that she has had a constant daily headache that is worse upon awakening times 3 weeks.  She denies any numbness, weakness, or tingling.  Denies any vision changes or slurred speech.  She denies any fevers, chills, or neck stiffness.  She states that she has never been evaluated for this headache before.  She rates the pain as a 5 out of 10.  She states that the headache is central, but is also sometimes on the right side.  Additionally, patient complains of right-sided cerumen impaction times 3 months.   The history is provided by the patient. No language interpreter was used.    Past Medical History:  Diagnosis Date  . Anxiety   . Chronic back pain   . COPD, mild (HCC)   . Emphysema lung (HCC)   . Endometriosis   . Fibromyalgia   . Neuropathic pain   . PTSD (post-traumatic stress disorder)     Patient Active Problem List   Diagnosis Date Noted  . Pneumothorax on left 10/06/2016  . Chronic pain syndrome 04/02/2015  . Generalized anxiety disorder 04/02/2015  . Hypokalemia 04/02/2015  . Normocytic anemia 04/02/2015  . Abnormal urinalysis 04/02/2015  . SOB (shortness of breath)   . CAP (community acquired pneumonia) 04/01/2015  . COPD (chronic obstructive pulmonary disease) (HCC) 04/01/2015    Past Surgical History:  Procedure Laterality Date  . CESAREAN SECTION    . copolscopy    . TONSILLECTOMY    . TUBAL LIGATION      OB History    No data available       Home Medications    Prior to Admission medications   Medication Sig Start Date End Date Taking? Authorizing Provider  albuterol (PROVENTIL  HFA;VENTOLIN HFA) 108 (90 Base) MCG/ACT inhaler Inhale 1-2 puffs into the lungs every 6 (six) hours as needed for wheezing or shortness of breath.   Yes [provider]  buprenorphine-naloxone (SUBOXONE) 8-2 mg SUBL SL tablet Place 1 tablet under the tongue daily.   Yes [provider]  mirtazapine (REMERON) 15 MG tablet Take 15 mg by mouth at bedtime.   Yes [provider]  venlafaxine (EFFEXOR) 75 MG tablet Take 75 mg by mouth 2 (two) times daily.   Yes [provider]  vitamin B-12 (CYANOCOBALAMIN) 1000 MCG tablet Take 2,000 mcg by mouth daily.   Yes [provider]    Family History Family History  Problem Relation Age of Onset  . Diabetes type II Neg Hx   . Stroke Neg Hx     Social History Social History   Tobacco Use  . Smoking status: Current Every Day Smoker    Packs/day: 1.00    Types: Cigarettes  . Smokeless tobacco: Current User  Substance Use Topics  . Alcohol use: No  . Drug use: No     Allergies   Guaifenesin & derivatives; Ibuprofen; Tylenol [acetaminophen]; Ampicillin; and Clindamycin/lincomycin   Review of Systems Review of Systems  All other systems reviewed and are negative.    Physical Exam Updated Vital Signs BP 132/83 (BP Location: Left Arm)  Pulse (!) 105   Temp 98.1 F (36.7 C) (Oral)   Resp 17   Ht 5\' 2"  (1.575 m)   Wt 45.4 kg (100 lb)   SpO2 98%   BMI 18.29 kg/m   Physical Exam  Constitutional: She is oriented to person, place, and time. She appears well-developed and well-nourished.  HENT:  Head: Normocephalic and atraumatic.  Right Ear: External ear normal.  Left Ear: External ear normal.  Eyes: Conjunctivae and EOM are normal. Pupils are equal, round, and reactive to light.  Neck: Normal range of motion. Neck supple.  No pain with neck flexion, no meningismus  Cardiovascular: Normal rate, regular rhythm and normal heart sounds. Exam reveals no gallop and no friction rub.  No murmur  heard. Pulmonary/Chest: Effort normal and breath sounds normal. No respiratory distress. She has no wheezes. She has no rales. She exhibits no tenderness.  Abdominal: Soft. She exhibits no distension and no mass. There is no tenderness. There is no rebound and no guarding.  Musculoskeletal: Normal range of motion. She exhibits no edema or tenderness.  Normal gait.  Neurological: She is alert and oriented to person, place, and time. She has normal reflexes.  CN 3-12 intact, normal finger to nose, no pronator drift, sensation and strength intact bilaterally.  Skin: Skin is warm and dry.  Psychiatric: She has a normal mood and affect. Her behavior is normal. Judgment and thought content normal.  Nursing note and vitals reviewed.    ED Treatments / Results  Labs (all labs ordered are listed, but only abnormal results are displayed) Labs Reviewed  CBC WITH DIFFERENTIAL/PLATELET  BASIC METABOLIC PANEL    EKG  EKG Interpretation None     ED ECG REPORT  I personally interpreted this EKG   Date: 11/06/2017   Rate: 91  Rhythm: normal sinus rhythm  QRS Axis: normal  Intervals: normal  ST/T Wave abnormalities: normal  Conduction Disutrbances:none  Narrative Interpretation:   Old EKG Reviewed: none available    Radiology Ct Head Wo Contrast  Result Date: 11/06/2017 CLINICAL DATA:  Acute headache. Patient reports constant headache for 2 months. Dizziness and lightheadedness. EXAM: CT HEAD WITHOUT CONTRAST TECHNIQUE: Contiguous axial images were obtained from the base of the skull through the vertex without intravenous contrast. COMPARISON:  None. FINDINGS: Brain: Brain volume is normal for age. No intracranial hemorrhage, mass effect, or midline shift. No hydrocephalus. The basilar cisterns are patent. No evidence of territorial infarct or acute ischemia. No extra-axial or intracranial fluid collection. Vascular: No hyperdense vessel or unexpected calcification. Skull: Normal. Negative  for fracture or focal lesion. Sinuses/Orbits: Frontal sinus is hypo pneumatized. Paranasal sinuses and mastoid air cells are clear. The visualized orbits are unremarkable. Other: None. IMPRESSION: Unremarkable noncontrast head CT.  No explanation for headache. Electronically Signed   By: Rubye Oaks M.D.   On: 11/06/2017 00:19    Procedures Procedures (including critical care time)  Medications Ordered in ED Medications - No data to display   Initial Impression / Assessment and Plan / ED Course  I have reviewed the triage vital signs and the nursing notes.  Pertinent labs & imaging results that were available during my care of the patient were reviewed by me and considered in my medical decision making (see chart for details).     Patient with headache x 3 weeks.  CT imaging is negative.  Laboratory workup is reassuring.  Patient given fluids, Benadryl, and Reglan in the ED.  Pt HA treated and improved  while in ED.  Presentation is non concerning [facility], ICH, Meningitis, or temporal arteritis. Pt is afebrile with no focal neuro deficits, nuchal rigidity, or change in vision. Pt is to follow up with PCP to discuss prophylactic medication. Pt verbalizes understanding and is agreeable with plan to dc.    Final Clinical Impressions(s) / ED Diagnoses   Final diagnoses:  Acute nonintractable headache, unspecified headache type  Impacted cerumen of right ear    ED Discharge Orders    None       Roxy HorsemanBrowning, Herschel Fleagle, PA-C 11/06/17 0121    Rolland PorterJames, Mark, MD 11/12/17 2210

## 2017-11-05 NOTE — ED Triage Notes (Signed)
Patient c/o headache that has been constant over the past 2 months with dizziness and lightheadedness. Patient reports right clogged ear that needs to be irrigated for 3 months, which she thinks could be cause of lightheadedness.

## 2017-11-06 DIAGNOSIS — R51 Headache: Secondary | ICD-10-CM | POA: Diagnosis not present

## 2017-11-06 LAB — CBC WITH DIFFERENTIAL/PLATELET
BASOS PCT: 0 %
Basophils Absolute: 0 10*3/uL (ref 0.0–0.1)
Eosinophils Absolute: 0.2 10*3/uL (ref 0.0–0.7)
Eosinophils Relative: 3 %
HEMATOCRIT: 40.6 % (ref 36.0–46.0)
HEMOGLOBIN: 13.2 g/dL (ref 12.0–15.0)
LYMPHS ABS: 3.9 10*3/uL (ref 0.7–4.0)
Lymphocytes Relative: 53 %
MCH: 31.3 pg (ref 26.0–34.0)
MCHC: 32.5 g/dL (ref 30.0–36.0)
MCV: 96.2 fL (ref 78.0–100.0)
MONOS PCT: 8 %
Monocytes Absolute: 0.6 10*3/uL (ref 0.1–1.0)
NEUTROS ABS: 2.6 10*3/uL (ref 1.7–7.7)
NEUTROS PCT: 36 %
Platelets: 150 10*3/uL (ref 150–400)
RBC: 4.22 MIL/uL (ref 3.87–5.11)
RDW: 13.1 % (ref 11.5–15.5)
WBC: 7.3 10*3/uL (ref 4.0–10.5)

## 2017-11-06 LAB — BASIC METABOLIC PANEL
Anion gap: 7 (ref 5–15)
BUN: 14 mg/dL (ref 6–20)
CHLORIDE: 103 mmol/L (ref 101–111)
CO2: 29 mmol/L (ref 22–32)
CREATININE: 0.68 mg/dL (ref 0.44–1.00)
Calcium: 9.5 mg/dL (ref 8.9–10.3)
GFR calc non Af Amer: >60 mL/min (ref >60)
Glucose, Bld: 88 mg/dL (ref 65–99)
Potassium: 4.1 mmol/L (ref 3.5–5.1)
Sodium: 139 mmol/L (ref 135–145)

## 2017-11-06 MED ORDER — METOCLOPRAMIDE HCL 5 MG/ML IJ SOLN
10.0000 mg | Freq: Once | INTRAMUSCULAR | Status: DC
  Filled 2017-11-06: qty 2

## 2017-11-06 MED ORDER — CARBAMIDE PEROXIDE 6.5 % OT SOLN: 5.0000 [drp] | Freq: Two times a day (BID) | OTIC | 0 refills | Status: DC

## 2017-11-06 MED ORDER — SODIUM CHLORIDE 0.9 % IV BOLUS (SEPSIS): 1000.0000 mL | Freq: Once | INTRAVENOUS | Status: DC

## 2017-11-06 MED ORDER — DIPHENHYDRAMINE HCL 50 MG/ML IJ SOLN
25.0000 mg | Freq: Once | INTRAMUSCULAR | Status: DC
  Filled 2017-11-06: qty 1

## 2017-11-06 MED ORDER — METOCLOPRAMIDE HCL 10 MG PO TABS
10.0000 mg | ORAL_TABLET | Freq: Once | ORAL | Status: AC
  Administered 2017-11-06: 10 mg via ORAL
  Filled 2017-11-06: qty 1

## 2017-11-06 MED ORDER — DIPHENHYDRAMINE HCL 25 MG PO CAPS
25.0000 mg | ORAL_CAPSULE | Freq: Once | ORAL | Status: AC
  Administered 2017-11-06: 25 mg via ORAL
  Filled 2017-11-06: qty 1

## 2017-11-06 NOTE — ED Notes (Signed)
Ear irrigation has been performed and caused the delay in departure.

## 2017-11-06 NOTE — ED Notes (Signed)
Patient transported to CT 

## 2018-04-03 ENCOUNTER — Inpatient Hospital Stay (HOSPITAL_COMMUNITY)
Admission: EM | Admit: 2018-04-03 | Discharge: 2018-04-13 | DRG: 190 | Disposition: A | Discharge status: Home / Self Care | Payer: Medicare Other | Attending: Internal Medicine | Admitting: Internal Medicine

## 2018-04-03 ENCOUNTER — Other Ambulatory Visit: Payer: Self-pay

## 2018-04-03 ENCOUNTER — Emergency Department (HOSPITAL_COMMUNITY): Payer: Medicare Other

## 2018-04-03 ENCOUNTER — Encounter (HOSPITAL_COMMUNITY): Payer: Self-pay | Admitting: Emergency Medicine

## 2018-04-03 DIAGNOSIS — M792 Neuralgia and neuritis, unspecified: Secondary | ICD-10-CM | POA: Insufficient documentation

## 2018-04-03 DIAGNOSIS — I080 Rheumatic disorders of both mitral and aortic valves: Secondary | ICD-10-CM | POA: Diagnosis present

## 2018-04-03 DIAGNOSIS — Z888 Allergy status to other drugs, medicaments and biological substances status: Secondary | ICD-10-CM | POA: Diagnosis not present

## 2018-04-03 DIAGNOSIS — D696 Thrombocytopenia, unspecified: Secondary | ICD-10-CM | POA: Diagnosis not present

## 2018-04-03 DIAGNOSIS — E871 Hypo-osmolality and hyponatremia: Secondary | ICD-10-CM | POA: Diagnosis not present

## 2018-04-03 DIAGNOSIS — N809 Endometriosis, unspecified: Secondary | ICD-10-CM | POA: Diagnosis present

## 2018-04-03 DIAGNOSIS — Z79899 Other long term (current) drug therapy: Secondary | ICD-10-CM

## 2018-04-03 DIAGNOSIS — Z881 Allergy status to other antibiotic agents status: Secondary | ICD-10-CM | POA: Diagnosis not present

## 2018-04-03 DIAGNOSIS — F4310 Post-traumatic stress disorder, unspecified: Secondary | ICD-10-CM | POA: Diagnosis present

## 2018-04-03 DIAGNOSIS — J439 Emphysema, unspecified: Principal | ICD-10-CM | POA: Diagnosis present

## 2018-04-03 DIAGNOSIS — Z681 Body mass index (BMI) 19 or less, adult: Secondary | ICD-10-CM | POA: Diagnosis not present

## 2018-04-03 DIAGNOSIS — J209 Acute bronchitis, unspecified: Secondary | ICD-10-CM | POA: Diagnosis not present

## 2018-04-03 DIAGNOSIS — E860 Dehydration: Secondary | ICD-10-CM | POA: Diagnosis present

## 2018-04-03 DIAGNOSIS — Z716 Tobacco abuse counseling: Secondary | ICD-10-CM

## 2018-04-03 DIAGNOSIS — E43 Unspecified severe protein-calorie malnutrition: Secondary | ICD-10-CM

## 2018-04-03 DIAGNOSIS — J441 Chronic obstructive pulmonary disease with (acute) exacerbation: Secondary | ICD-10-CM | POA: Diagnosis not present

## 2018-04-03 DIAGNOSIS — B9781 Human metapneumovirus as the cause of diseases classified elsewhere: Secondary | ICD-10-CM | POA: Diagnosis present

## 2018-04-03 DIAGNOSIS — R7989 Other specified abnormal findings of blood chemistry: Secondary | ICD-10-CM | POA: Diagnosis present

## 2018-04-03 DIAGNOSIS — E46 Unspecified protein-calorie malnutrition: Secondary | ICD-10-CM | POA: Insufficient documentation

## 2018-04-03 DIAGNOSIS — G8929 Other chronic pain: Secondary | ICD-10-CM | POA: Diagnosis present

## 2018-04-03 DIAGNOSIS — F17210 Nicotine dependence, cigarettes, uncomplicated: Secondary | ICD-10-CM | POA: Diagnosis present

## 2018-04-03 DIAGNOSIS — M549 Dorsalgia, unspecified: Secondary | ICD-10-CM | POA: Diagnosis not present

## 2018-04-03 DIAGNOSIS — Z720 Tobacco use: Secondary | ICD-10-CM | POA: Diagnosis not present

## 2018-04-03 DIAGNOSIS — R000 Tachycardia, unspecified: Secondary | ICD-10-CM | POA: Diagnosis present

## 2018-04-03 DIAGNOSIS — Z886 Allergy status to analgesic agent status: Secondary | ICD-10-CM

## 2018-04-03 DIAGNOSIS — F419 Anxiety disorder, unspecified: Secondary | ICD-10-CM | POA: Diagnosis present

## 2018-04-03 DIAGNOSIS — J9601 Acute respiratory failure with hypoxia: Secondary | ICD-10-CM | POA: Diagnosis not present

## 2018-04-03 DIAGNOSIS — E872 Acidosis: Secondary | ICD-10-CM | POA: Diagnosis present

## 2018-04-03 DIAGNOSIS — G629 Polyneuropathy, unspecified: Secondary | ICD-10-CM | POA: Diagnosis present

## 2018-04-03 DIAGNOSIS — E861 Hypovolemia: Secondary | ICD-10-CM | POA: Diagnosis present

## 2018-04-03 DIAGNOSIS — M797 Fibromyalgia: Secondary | ICD-10-CM | POA: Diagnosis present

## 2018-04-03 DIAGNOSIS — J449 Chronic obstructive pulmonary disease, unspecified: Secondary | ICD-10-CM

## 2018-04-03 DIAGNOSIS — R0600 Dyspnea, unspecified: Secondary | ICD-10-CM | POA: Diagnosis not present

## 2018-04-03 DIAGNOSIS — J440 Chronic obstructive pulmonary disease with acute lower respiratory infection: Secondary | ICD-10-CM | POA: Diagnosis not present

## 2018-04-03 DIAGNOSIS — R109 Unspecified abdominal pain: Secondary | ICD-10-CM | POA: Diagnosis not present

## 2018-04-03 DIAGNOSIS — K219 Gastro-esophageal reflux disease without esophagitis: Secondary | ICD-10-CM | POA: Diagnosis present

## 2018-04-03 DIAGNOSIS — R197 Diarrhea, unspecified: Secondary | ICD-10-CM | POA: Diagnosis not present

## 2018-04-03 LAB — INFLUENZA PANEL BY PCR (TYPE A & B)
Influenza A By PCR: NEGATIVE
Influenza B By PCR: NEGATIVE

## 2018-04-03 LAB — RESPIRATORY PANEL BY PCR
ADENOVIRUS-RVPPCR: NOT DETECTED
Bordetella pertussis: NOT DETECTED
CORONAVIRUS 229E-RVPPCR: NOT DETECTED
CORONAVIRUS HKU1-RVPPCR: NOT DETECTED
CORONAVIRUS OC43-RVPPCR: NOT DETECTED
Chlamydophila pneumoniae: NOT DETECTED
Coronavirus NL63: NOT DETECTED
INFLUENZA B-RVPPCR: NOT DETECTED
Influenza A: NOT DETECTED
MYCOPLASMA PNEUMONIAE-RVPPCR: NOT DETECTED
Metapneumovirus: DETECTED — AB
PARAINFLUENZA VIRUS 1-RVPPCR: NOT DETECTED
Parainfluenza Virus 2: NOT DETECTED
Parainfluenza Virus 3: NOT DETECTED
Parainfluenza Virus 4: NOT DETECTED
RESPIRATORY SYNCYTIAL VIRUS-RVPPCR: NOT DETECTED
Rhinovirus / Enterovirus: NOT DETECTED

## 2018-04-03 LAB — I-STAT VENOUS BLOOD GAS, ED
ACID-BASE EXCESS: 2 mmol/L (ref 0.0–2.0)
Bicarbonate: 28.8 mmol/L — ABNORMAL HIGH (ref 20.0–28.0)
O2 Saturation: 60 %
PH VEN: 7.341 (ref 7.250–7.430)
TCO2: 30 mmol/L (ref 22–32)
pCO2, Ven: 53.2 mmHg (ref 44.0–60.0)
pO2, Ven: 34 mmHg (ref 32.0–45.0)

## 2018-04-03 LAB — BASIC METABOLIC PANEL
Anion gap: 12 (ref 5–15)
BUN: 9 mg/dL (ref 6–20)
CALCIUM: 9.1 mg/dL (ref 8.9–10.3)
CO2: 24 mmol/L (ref 22–32)
Chloride: 96 mmol/L — ABNORMAL LOW (ref 101–111)
Creatinine, Ser: 0.72 mg/dL (ref 0.44–1.00)
GFR calc Af Amer: >60 mL/min (ref >60)
GLUCOSE: 117 mg/dL — AB (ref 65–99)
POTASSIUM: 4.5 mmol/L (ref 3.5–5.1)
Sodium: 132 mmol/L — ABNORMAL LOW (ref 135–145)

## 2018-04-03 LAB — I-STAT CG4 LACTIC ACID, ED
Lactic Acid, Venous: 1.76 mmol/L (ref 0.5–1.9)
Lactic Acid, Venous: 2.12 mmol/L (ref 0.5–1.9)

## 2018-04-03 LAB — CBC WITH DIFFERENTIAL/PLATELET
Basophils Absolute: 0 10*3/uL (ref 0.0–0.1)
Basophils Relative: 0 %
EOS PCT: 0 %
Eosinophils Absolute: 0 10*3/uL (ref 0.0–0.7)
HCT: 39.8 % (ref 36.0–46.0)
Hemoglobin: 12.9 g/dL (ref 12.0–15.0)
LYMPHS PCT: 16 %
Lymphs Abs: 1 10*3/uL (ref 0.7–4.0)
MCH: 30.7 pg (ref 26.0–34.0)
MCHC: 32.4 g/dL (ref 30.0–36.0)
MCV: 94.8 fL (ref 78.0–100.0)
MONO ABS: 0.7 10*3/uL (ref 0.1–1.0)
MONOS PCT: 11 %
Neutro Abs: 4.7 10*3/uL (ref 1.7–7.7)
Neutrophils Relative %: 73 %
PLATELETS: 96 10*3/uL — AB (ref 150–400)
RBC: 4.2 MIL/uL (ref 3.87–5.11)
RDW: 13.4 % (ref 11.5–15.5)
WBC: 6.4 10*3/uL (ref 4.0–10.5)

## 2018-04-03 LAB — I-STAT TROPONIN, ED: Troponin i, poc: 0 ng/mL (ref 0.00–0.08)

## 2018-04-03 LAB — BRAIN NATRIURETIC PEPTIDE: B NATRIURETIC PEPTIDE 5: 16.4 pg/mL (ref 0.0–100.0)

## 2018-04-03 LAB — LACTIC ACID, PLASMA
Lactic Acid, Venous: 2.5 mmol/L (ref 0.5–1.9)
Lactic Acid, Venous: 1.8 mmol/L (ref 0.5–1.9)

## 2018-04-03 MED ORDER — METHYLPREDNISOLONE SODIUM SUCC 125 MG IJ SOLR
60.0000 mg | Freq: Four times a day (QID) | INTRAMUSCULAR | Status: DC
  Administered 2018-04-03 – 2018-04-04 (×4): 60 mg via INTRAVENOUS
  Filled 2018-04-03 (×4): qty 2

## 2018-04-03 MED ORDER — MIRTAZAPINE 15 MG PO TABS
15.0000 mg | ORAL_TABLET | Freq: Every day | ORAL | Status: DC
  Administered 2018-04-03: 15 mg via ORAL
  Filled 2018-04-03: qty 1

## 2018-04-03 MED ORDER — LEVALBUTEROL HCL 0.63 MG/3ML IN NEBU
0.6300 mg | INHALATION_SOLUTION | Freq: Four times a day (QID) | RESPIRATORY_TRACT | Status: DC
  Administered 2018-04-03 – 2018-04-06 (×11): 0.63 mg via RESPIRATORY_TRACT
  Filled 2018-04-03 (×12): qty 3

## 2018-04-03 MED ORDER — SODIUM CHLORIDE 0.9 % IV SOLN
INTRAVENOUS | Status: DC
  Administered 2018-04-03: 14:00:00 via INTRAVENOUS
  Administered 2018-04-03: 125 mL/h via INTRAVENOUS
  Administered 2018-04-04 (×2): via INTRAVENOUS
  Administered 2018-04-05: 75 mL/h via INTRAVENOUS
  Administered 2018-04-05: 18:00:00 via INTRAVENOUS

## 2018-04-03 MED ORDER — ACETAMINOPHEN 325 MG PO TABS: 650.0000 mg | ORAL_TABLET | Freq: Four times a day (QID) | ORAL | Status: DC | PRN

## 2018-04-03 MED ORDER — ONDANSETRON HCL 4 MG/2ML IJ SOLN
4.0000 mg | Freq: Four times a day (QID) | INTRAMUSCULAR | Status: DC | PRN
  Administered 2018-04-04 – 2018-04-07 (×4): 4 mg via INTRAVENOUS
  Filled 2018-04-03 (×4): qty 2

## 2018-04-03 MED ORDER — METHYLPREDNISOLONE SODIUM SUCC 125 MG IJ SOLR
125.0000 mg | Freq: Once | INTRAMUSCULAR | Status: AC
  Administered 2018-04-03: 125 mg via INTRAVENOUS
  Filled 2018-04-03: qty 2

## 2018-04-03 MED ORDER — IPRATROPIUM BROMIDE 0.02 % IN SOLN
0.5000 mg | Freq: Four times a day (QID) | RESPIRATORY_TRACT | Status: DC
  Administered 2018-04-03 – 2018-04-06 (×11): 0.5 mg via RESPIRATORY_TRACT
  Filled 2018-04-03 (×12): qty 2.5

## 2018-04-03 MED ORDER — SODIUM CHLORIDE 0.9 % IV SOLN
1.0000 g | Freq: Once | INTRAVENOUS | Status: AC
  Administered 2018-04-03: 1 g via INTRAVENOUS
  Filled 2018-04-03: qty 10

## 2018-04-03 MED ORDER — ALBUTEROL (5 MG/ML) CONTINUOUS INHALATION SOLN
10.0000 mg/h | INHALATION_SOLUTION | RESPIRATORY_TRACT | Status: DC
  Administered 2018-04-03: 10 mg/h via RESPIRATORY_TRACT
  Filled 2018-04-03: qty 20

## 2018-04-03 MED ORDER — AZITHROMYCIN 250 MG PO TABS
500.0000 mg | ORAL_TABLET | Freq: Once | ORAL | Status: AC
  Administered 2018-04-03: 500 mg via ORAL
  Filled 2018-04-03: qty 2

## 2018-04-03 MED ORDER — ACETAMINOPHEN 650 MG RE SUPP: 650.0000 mg | Freq: Four times a day (QID) | RECTAL | Status: DC | PRN

## 2018-04-03 MED ORDER — NICOTINE 21 MG/24HR TD PT24
21.0000 mg | MEDICATED_PATCH | Freq: Every day | TRANSDERMAL | Status: DC
  Administered 2018-04-03 – 2018-04-13 (×11): 21 mg via TRANSDERMAL
  Filled 2018-04-03 (×11): qty 1

## 2018-04-03 MED ORDER — VENLAFAXINE HCL 75 MG PO TABS
75.0000 mg | ORAL_TABLET | Freq: Two times a day (BID) | ORAL | Status: DC
  Administered 2018-04-03 – 2018-04-13 (×20): 75 mg via ORAL
  Filled 2018-04-03 (×22): qty 1

## 2018-04-03 MED ORDER — ONDANSETRON HCL 4 MG PO TABS
4.0000 mg | ORAL_TABLET | Freq: Four times a day (QID) | ORAL | Status: DC | PRN
  Administered 2018-04-12: 4 mg via ORAL
  Filled 2018-04-03: qty 1

## 2018-04-03 MED ORDER — LEVALBUTEROL HCL 0.63 MG/3ML IN NEBU: 0.6300 mg | INHALATION_SOLUTION | Freq: Three times a day (TID) | RESPIRATORY_TRACT | Status: DC

## 2018-04-03 MED ORDER — ALBUTEROL SULFATE (2.5 MG/3ML) 0.083% IN NEBU: 2.5000 mg | INHALATION_SOLUTION | RESPIRATORY_TRACT | Status: DC | PRN

## 2018-04-03 MED ORDER — ENSURE ENLIVE PO LIQD
237.0000 mL | Freq: Three times a day (TID) | ORAL | Status: DC
  Administered 2018-04-03 – 2018-04-11 (×21): 237 mL via ORAL

## 2018-04-03 MED ORDER — DOXYCYCLINE HYCLATE 100 MG PO TABS
100.0000 mg | ORAL_TABLET | Freq: Two times a day (BID) | ORAL | Status: DC
  Administered 2018-04-04 – 2018-04-13 (×19): 100 mg via ORAL
  Filled 2018-04-03 (×20): qty 1

## 2018-04-03 MED ORDER — SODIUM CHLORIDE 0.9% FLUSH
3.0000 mL | Freq: Two times a day (BID) | INTRAVENOUS | Status: DC
  Administered 2018-04-03 – 2018-04-13 (×18): 3 mL via INTRAVENOUS

## 2018-04-03 MED ORDER — ENOXAPARIN SODIUM 40 MG/0.4ML ~~LOC~~ SOLN: 40.0000 mg | SUBCUTANEOUS | Status: DC

## 2018-04-03 MED ORDER — BUPRENORPHINE HCL-NALOXONE HCL 8-2 MG SL SUBL
1.0000 | SUBLINGUAL_TABLET | Freq: Every day | SUBLINGUAL | Status: DC
  Administered 2018-04-03 – 2018-04-13 (×11): 1 via SUBLINGUAL
  Filled 2018-04-03 (×12): qty 1

## 2018-04-03 NOTE — ED Triage Notes (Addendum)
Pt arrives via [facility] from home for c/o worsening sob over the past 2 days, hx of copd. Pt received 0.5 mg atrovent and 10mb albuterol pta. Endorses yellow phlegm. Pt a/ox4, ems reports wheezing in all lung fields.

## 2018-04-03 NOTE — ED Provider Notes (Signed)
[facility]ENC[facility] Provider Note   CSN: [id number] Arrival date & time: 04/03/18  0830     History   Chief Complaint Chief Complaint  Patient presents with  . Shortness of Breath    HPI Michele Lucas is a 52 y.o. female.  Pt presents to the ED today with SOB.  The pt said she's had worsening sob for the past 2 days.  She has been coughing up yellow phlegm.  The pt was given 10 mg albuterol and 0.5 mg atrovent by EMS.  She said prednisone upsets her stomach, so EMS did not give her solumedrol.  The pt does not wear O2 at home.  Pt broke out in a sweat this morning with worsening sob, so her daughter called EMS.     Past Medical History:  Diagnosis Date  . Anxiety   . Chronic back pain   . COPD, mild (HCC)   . Emphysema lung (HCC)   . Endometriosis   . Fibromyalgia   . Neuropathic pain   . PTSD (post-traumatic stress disorder)     Patient Active Problem List   Diagnosis Date Noted  . Acute respiratory failure with hypoxia (HCC) 04/03/2018  . Acute bronchitis with COPD (HCC) 04/03/2018  . Pneumothorax on left 10/06/2016  . Chronic pain syndrome 04/02/2015  . Generalized anxiety disorder 04/02/2015  . Hypokalemia 04/02/2015  . Normocytic anemia 04/02/2015  . Abnormal urinalysis 04/02/2015  . SOB (shortness of breath)   . CAP (community acquired pneumonia) 04/01/2015  . COPD (chronic obstructive pulmonary disease) (HCC) 04/01/2015    Past Surgical History:  Procedure Laterality Date  . CESAREAN SECTION    . copolscopy    . TONSILLECTOMY    . TUBAL LIGATION       OB History   None      Home Medications    Prior to Admission medications   Medication Sig Start Date End Date Taking? Authorizing Provider  albuterol (PROVENTIL HFA;VENTOLIN HFA) 108 (90 Base) MCG/ACT inhaler Inhale 1-2 puffs into the lungs every 6 (six) hours as needed for wheezing or shortness of breath.    [provider]  buprenorphine-naloxone  (SUBOXONE) 8-2 mg SUBL SL tablet Place 1 tablet under the tongue daily.    [provider]  carbamide peroxide (DEBROX) 6.5 % OTIC solution Place 5 drops into the right ear 2 (two) times daily. 11/06/17   Roxy HorsemanBrowning, Robert, PA-C  mirtazapine (REMERON) 15 MG tablet Take 15 mg by mouth at bedtime.    [provider]  venlafaxine (EFFEXOR) 75 MG tablet Take 75 mg by mouth 2 (two) times daily.    [provider]  vitamin B-12 (CYANOCOBALAMIN) 1000 MCG tablet Take 2,000 mcg by mouth daily.    [provider]    Family History Family History  Problem Relation Age of Onset  . Diabetes type II Neg Hx   . Stroke Neg Hx     Social History Social History   Tobacco Use  . Smoking status: Current Every Day Smoker    Packs/day: 1.00    Types: Cigarettes  . Smokeless tobacco: Current User  Substance Use Topics  . Alcohol use: No  . Drug use: No     Allergies   Guaifenesin & derivatives; Ibuprofen; Tylenol [acetaminophen]; Ampicillin; and Clindamycin/lincomycin   Review of Systems Review of Systems  Respiratory: Positive for cough, shortness of breath and wheezing.   All other systems reviewed and are negative.    Physical Exam  Updated Vital Signs BP 117/75   Pulse (!) 127   Temp 99 F (37.2 C) (Oral)   Resp (!) 22   SpO2 (!) 85%   Physical Exam  Constitutional: She is oriented to person, place, and time. She appears well-developed and well-nourished. She appears distressed.  HENT:  Head: Normocephalic and atraumatic.  Mouth/Throat: Oropharynx is clear and moist.  Eyes: Pupils are equal, round, and reactive to light. EOM are normal.  Neck: Normal range of motion. Neck supple.  Cardiovascular: Regular rhythm. Tachycardia present.  Pulmonary/Chest: Accessory muscle usage present. Tachypnea noted. She is in respiratory distress. She has wheezes.  Abdominal: Soft. Bowel sounds are normal.  Musculoskeletal: Normal range of motion.       Right  lower leg: Normal.       Left lower leg: Normal.  Neurological: She is alert and oriented to person, place, and time.  Skin: Skin is warm. Capillary refill takes less than 2 seconds.  Psychiatric: She has a normal mood and affect. Her behavior is normal.  Nursing note and vitals reviewed.    ED Treatments / Results  Labs (all labs ordered are listed, but only abnormal results are displayed) Labs Reviewed  BASIC METABOLIC PANEL - Abnormal; Notable for the following components:      Result Value   Sodium 132 (*)    Chloride 96 (*)    Glucose, Bld 117 (*)    All other components within normal limits  CBC WITH DIFFERENTIAL/PLATELET - Abnormal; Notable for the following components:   Platelets 96 (*)    All other components within normal limits  CULTURE, BLOOD (ROUTINE X 2)  CULTURE, BLOOD (ROUTINE X 2)  BRAIN NATRIURETIC PEPTIDE  I-STAT TROPONIN, ED  I-STAT CG4 LACTIC ACID, ED  I-STAT CG4 LACTIC ACID, ED  I-STAT ARTERIAL BLOOD GAS, ED    EKG EKG Interpretation  Date/Time:  Friday April 03 2018 08:51:59 EDT Ventricular Rate:  117 PR Interval:  118 QRS Duration: 72 QT Interval:  288 QTC Calculation: 401 R Axis:   80 Text Interpretation:  Sinus tachycardia Right atrial enlargement Left ventricular hypertrophy with repolarization abnormality Abnormal ECG Since last tracing rate faster Confirmed by Jacalyn Lefevre [phone]) on 04/03/2018 8:59:38 AM   Radiology Dg Chest 2 View  Result Date: 04/03/2018 CLINICAL DATA:  Worsening shortness of breath over the past 2 days. History COPD. EXAM: CHEST - 2 VIEW COMPARISON:  Chest x-ray dated June 25, 2017. FINDINGS: The heart size and mediastinal contours are within normal limits. Normal pulmonary vascularity. The lungs remain hyperinflated. No focal consolidation, pleural effusion, or pneumothorax. No acute osseous abnormality. IMPRESSION: COPD.  No active cardiopulmonary disease. Electronically Signed   By: Obie Dredge M.D.   On:  04/03/2018 09:17    Procedures Procedures (including critical care time)  Medications Ordered in ED Medications  albuterol (PROVENTIL,VENTOLIN) solution continuous neb (0 mg/hr Nebulization Stopped 04/03/18 1036)  methylPREDNISolone sodium succinate (SOLU-MEDROL) 125 mg/2 mL injection 60 mg (has no administration in time range)  levalbuterol (XOPENEX) nebulizer solution 0.63 mg (has no administration in time range)  methylPREDNISolone sodium succinate (SOLU-MEDROL) 125 mg/2 mL injection 125 mg (125 mg Intravenous Given 04/03/18 0853)  cefTRIAXone (ROCEPHIN) 1 g in sodium chloride 0.9 % 100 mL IVPB (0 g Intravenous Stopped 04/03/18 1036)  azithromycin (ZITHROMAX) tablet 500 mg (500 mg Oral Given 04/03/18 6045)     Initial Impression / Assessment and Plan / ED Course  I have reviewed the triage vital signs and the nursing  notes.  Pertinent labs & imaging results that were available during my care of the patient were reviewed by me and considered in my medical decision making (see chart for details).    Breathing has improved with nebs, but she is still tachypneic and wheezing.  No pna on CXR, but she is coughing up colored sputum, so she was given rocephin and zithromax for bronchitis.  She is still hypoxic on 4l, so I spoke with the hospitalists for admission.  Final Clinical Impressions(s) / ED Diagnoses   Final diagnoses:  COPD exacerbation (HCC)  Acute bronchitis, unspecified organism  Acute respiratory failure with hypoxia [facility])    ED Discharge Orders    None       Jacalyn Lefevre, MD 04/03/18 1050

## 2018-04-03 NOTE — Progress Notes (Signed)
ABG ordered for patient however venous sample was obtained.  Results were given to MD and no further sticks performed at this time.    Ref. Range 04/03/2018 11:17  Sample type Unknown VENOUS  pH, Ven Latest Ref Range: 7.250 - 7.430  7.341  pCO2, Ven Latest Ref Range: 44.0 - 60.0 mmHg 53.2  pO2, Ven Latest Ref Range: 32.0 - 45.0 mmHg 34.0  TCO2 Latest Ref Range: 22 - 32 mmol/L 30  Acid-Base Excess Latest Ref Range: 0.0 - 2.0 mmol/L 2.0  Bicarbonate Latest Ref Range: 20.0 - 28.0 mmol/L 28.8 (H)  O2 Saturation Latest Units: % 60.0  Patient temperature Unknown 98.6 F  Collection site Unknown RADIAL, [facility] T.Marland Kitchen..Marland Kitchen

## 2018-04-03 NOTE — Progress Notes (Signed)
Initial Nutrition Assessment  DOCUMENTATION CODES:   Underweight, Severe malnutrition in context of chronic illness  INTERVENTION:    Ensure Enlive po BID, each supplement provides 350 kcal and 20 grams of protein  Magic cup TID with meals, each supplement provides 290 kcal and 9 grams of protein  NUTRITION DIAGNOSIS:   Severe Malnutrition related to chronic illness(COPD) as evidenced by energy intake < or equal to 75% for > or equal to 1 month, severe fat depletion, severe muscle depletion.  GOAL:   Patient will meet greater than or equal to 90% of their needs  MONITOR:   PO intake, Supplement acceptance, Weight trends, Labs  REASON FOR ASSESSMENT:   Consult Assessment of nutrition requirement/status  ASSESSMENT:   Patient with PMH significant for COPD, tobacco abuse, PTSD, fibromyalgia/chronic pain/neuropathic pain, and chronic malnutrition. Presents this admission with acute bronchitis with COPD.    Spoke with pt at bedside who is very tearful. Denies any loss in appetite. States she typically eats three meals each day with snacks. Most meal are eaten at restaurants and include:  B- grits, eggs, and sausage L- shrimp with a grain at [employer] D- steak with vegetable/grain at a steak house  Question validity of her dietary recall. Reports using supplementation 4-6 times each day, but "they do not work." Discussed the importance of high calorie high protein intake for preservation of lean body mass. Suggested high calorie high protein food options pt could eat at home. RD to order supplements this stay.   Pt endorses a UBW of 100 lb. Records are limited in weight history but show a stated weight of 100 lb 11/05/18 and 88 lb this admission. Hard to determine actual weight loss with limited evidence. Nutrition-Focused physical exam completed.   Medications reviewed and include: suboxone, solu-medrol, Remeron, NS @ 125 ml/hr Labs reviewed: Na 132 (L)  NUTRITION -  FOCUSED PHYSICAL EXAM:    Most Recent Value  Orbital Region  Moderate depletion  Upper Arm Region  Severe depletion  Thoracic and Lumbar Region  Severe depletion  Buccal Region  Moderate depletion  Temple Region  Moderate depletion  Clavicle Bone Region  Severe depletion  Clavicle and Acromion Bone Region  Severe depletion  Scapular Bone Region  Severe depletion  Dorsal Hand  Moderate depletion  Patellar Region  Severe depletion  Anterior Thigh Region  Severe depletion  Posterior Calf Region  Severe depletion  Edema (RD Assessment)  None  Hair  Reviewed  Eyes  Reviewed  Mouth  Reviewed  Skin  Reviewed  Nails  Reviewed     Diet Order:  Diet regular Room service appropriate? Yes; Fluid consistency: Thin  EDUCATION NEEDS:   Education needs have been addressed  Skin:  Skin Assessment: Reviewed RN Assessment  Last BM:  04/03/18  Height:   Ht Readings from Last 1 Encounters:  04/03/18 5\' 2"  (1.575 m)    Weight:   Wt Readings from Last 1 Encounters:  04/03/18 88 lb 13.5 oz (40.3 kg)    Ideal Body Weight:  50 kg  BMI:  Body mass index is 16.25 kg/m.  Estimated Nutritional Needs:   Kcal:  1450-1650 kcal  Protein:  70-80 g  Fluid:  >1.4 L/day    Vanessa Kickarly Carolan Avedisian RD, LDN Clinical Nutrition Pager # [phone]- [phone]

## 2018-04-03 NOTE — Progress Notes (Addendum)
Patient has ordered ABG, patient refuses to let RT obtain ABG stating she has a needle phobia and has been "stuck" enough today. NP Junious SilkAllison Ellis made aware and [facility]. Will continue to monitor respiratory status.

## 2018-04-03 NOTE — ED Notes (Signed)
Dr. Particia NearingHaviland made aware that patient's O2 sat 86% on 4L O2 and continuous neb is finished, MD at bedside to assess patient.

## 2018-04-03 NOTE — H&P (Signed)
History and Physical    Michele Lucas ZOX:[id number] DOB: 02/23/1966 DOA: 04/03/2018  **Will admit patient based on the expectation that the patient will need hospitalization/ hospital care that crosses at least 2 midnights  PCP: Clare GandyEdelman, David, MD [facility] [place]  Attending physician: Luberta Robertsonhatterjee  Patient coming from/Resides with: Private residence  Chief Complaint: Shortness of breath  HPI: Michele BariCarla Allsbrook is a 52 y.o. female with medical history significant for COPD not on oxygen with continued tobacco abuse, PTSD/anxiety disorder in the context of prior military service, fibromyalgia/chronic pain/neuropathic pain, and chronic malnutrition.  She reports for the past 2 days she has been having extreme shortness of breath, productive cough with yellow-green sputum, she has not been having any fevers, she has been diaphoretic especially just prior to coming to the ER.  She was transported to the ER via EMS.  Was given 0.5 mg of Atrovent and a continuous 10 mg albuterol neb by EMS.  She was found with diffuse wheezing throughout lung fields.  Upon presentation to the ER patient had O2 sats down to 86% she was continued on nasal cannula oxygen.  Air movement improved with administration of additional nebs and Solu-Medrol.  Chest x-ray without focal infiltrate.  She did have low-grade temperature 99.3 and had persistent tachycardia with rates up to the 130s in the context of beta agonist medications.  ABG has been ordered by EDP-I was in room during draw and sample quite dark in appearance and result more consistent with venous blood gas.  And also given dosage each of IV Zithromax and Rocephin.  She reports poor oral intake secondary to inability to breathe therefore unable to eat.  ED Course:  Vital Signs: BP 117/75   Pulse (!) 127   Temp 99 F (37.2 C) (Oral)   Resp (!) 22   SpO2 (!) 85%  Chest x-ray: No acute cardiopulmonary process Lab data: Sodium 132, potassium 4.5, chloride 96, CO2 24, glucose  117, BUN 9, creatinine 0.72, anion gap 12, BNP 16.4, troponin negative, lactic acid 1.76 up to 2.12 on second withdrawal, white count 6400 with normal differential, hemoglobin 12.9, platelets 96,000, blood cultures obtained in the ER Medications and treatments: Albuterol continuous neb 10 mg/hr x1, Solu-Medrol 125 mg IV x1, Rocephin 1 g IV x1, Zithromax 500 mg IV x1  Review of Systems:  In addition to the HPI above,  No Fever-chills, myalgias or other constitutional symptoms No Headache, changes with Vision or hearing, new weakness, tingling, numbness in any extremity, dizziness, dysarthria or word finding difficulty, gait disturbance or imbalance, tremors or seizure activity No problems swallowing food or Liquids, indigestion/reflux, choking or coughing while eating, abdominal pain with or after eating No Chest pain, palpitations, orthopnea  No Abdominal pain, N/V, melena,hematochezia, dark tarry stools, constipation No dysuria, malodorous urine, hematuria or flank pain No new skin rashes, lesions, masses or bruises, No new joint pains, aches, swelling or redness No recent unintentional weight gain or loss No polyuria, polydypsia or polyphagia   Past Medical History:  Diagnosis Date  . Anxiety   . Chronic back pain   . COPD, mild (HCC)   . Emphysema lung (HCC)   . Endometriosis   . Fibromyalgia   . Neuropathic pain   . PTSD (post-traumatic stress disorder)     Past Surgical History:  Procedure Laterality Date  . CESAREAN SECTION    . copolscopy    . TONSILLECTOMY    . TUBAL LIGATION      Social History  Socioeconomic History  . Marital status: Married    Spouse name: Not on file  . Number of children: Not on file  . Years of education: Not on file  . Highest education level: Not on file  Occupational History  . Not on file  Social Needs  . Financial resource strain: Not on file  . Food insecurity:    Worry: Not on file    Inability: Not on file  . Transportation  needs:    Medical: Not on file    Non-medical: Not on file  Tobacco Use  . Smoking status: Current Every Day Smoker    Packs/day: 1.00    Types: Cigarettes  . Smokeless tobacco: Current User  . Tobacco comment: Previously documented in a pack per day-she now reports 4 to 5 cigarettes/day  Substance and Sexual Activity  . Alcohol use: No  . Drug use: No  . Sexual activity: Yes    Birth control/protection: Surgical  Lifestyle  . Physical activity:    Days per week: Not on file    Minutes per session: Not on file  . Stress: Not on file  Relationships  . Social connections:    Talks on phone: Not on file    Gets together: Not on file    Attends religious service: Not on file    Active member of club or organization: Not on file    Attends meetings of clubs or organizations: Not on file    Relationship status: Not on file  . Intimate partner violence:    Fear of current or ex partner: Not on file    Emotionally abused: Not on file    Physically abused: Not on file    Forced sexual activity: Not on file  Other Topics Concern  . Not on file  Social History Narrative  . Not on file    Mobility: Utilizes a cane Work history: Disabled   Allergies  Allergen Reactions  . Guaifenesin & Derivatives Shortness Of Breath    "Mucinex brand"  . Ibuprofen     Upset stomach  . Tylenol [Acetaminophen]     Upset stomach  . Ampicillin Hives and Rash  . Clindamycin/Lincomycin Swelling and Rash    Have these reactions in feet, ankles and legs.    Family History  Problem Relation Age of Onset  . Diabetes type II Neg Hx   . Stroke Neg Hx      Prior to Admission medications   Medication Sig Start Date End Date Taking? Authorizing Provider  albuterol (PROVENTIL HFA;VENTOLIN HFA) 108 (90 Base) MCG/ACT inhaler Inhale 1-2 puffs into the lungs every 6 (six) hours as needed for wheezing or shortness of breath.    [provider]  buprenorphine-naloxone (SUBOXONE) 8-2 mg SUBL SL  tablet Place 1 tablet under the tongue daily.    [provider]  carbamide peroxide (DEBROX) 6.5 % OTIC solution Place 5 drops into the right ear 2 (two) times daily. 11/06/17   Roxy Horseman, PA-C  mirtazapine (REMERON) 15 MG tablet Take 15 mg by mouth at bedtime.    [provider]  venlafaxine (EFFEXOR) 75 MG tablet Take 75 mg by mouth 2 (two) times daily.    [provider]  vitamin B-12 (CYANOCOBALAMIN) 1000 MCG tablet Take 2,000 mcg by mouth daily.    [provider]    Physical Exam: Vitals:   04/03/18 0900 04/03/18 1004 04/03/18 1027 04/03/18 1030  BP:  (!) 123/94 117/76 117/75  Pulse:  Marland Kitchen)  122  (!) 127  Resp:  (!) 25  (!) 22  Temp:      TempSrc:      SpO2: (!) 88% (!) 88%  (!) 85%      Constitutional: NAD, anxious but pleasant comfortable Eyes: PERRL, lids and conjunctivae normal ENMT: Mucous membranes are dry. Posterior pharynx clear of any exudate or lesions. Poor dentition.  Neck: normal, supple, no masses, no thyromegaly Respiratory: Coarse expiratory rhonchi with wheezes primarily in the right lobe, good air movement, 4 L nasal cannula oxygen with O2 sats 85%, no increased work of breathing at rest. Cardiovascular: Regular rate and rhythm, no murmurs / rubs / gallops. No extremity edema. 2+ pedal pulses. No carotid bruits.  Abdomen: no tenderness, no masses palpated. No hepatosplenomegaly. Bowel sounds positive.  Musculoskeletal: no clubbing / cyanosis. No joint deformity upper and lower extremities. Good ROM, no contractures. Normal muscle tone.  Skin: no rashes, lesions, ulcers. No induration Neurologic: CN 2-12 grossly intact. Sensation intact, DTR normal. Strength 5/5 x all 4 extremities.  Psychiatric: Normal judgment and insight. Alert and oriented x 3. Normal mood.    Labs on Admission: I have personally reviewed following labs and imaging studies  CBC: Recent Labs  Lab 04/03/18 0855  WBC 6.4  NEUTROABS 4.7  HGB 12.9   HCT 39.8  MCV 94.8  PLT 96*   Basic Metabolic Panel: Recent Labs  Lab 04/03/18 0855  NA 132*  K 4.5  CL 96*  CO2 24  GLUCOSE 117*  BUN 9  CREATININE 0.72  CALCIUM 9.1   GFR: CrCl cannot be calculated (Unknown ideal weight.). Liver Function Tests: No results for input(s): AST, ALT, ALKPHOS, BILITOT, PROT, ALBUMIN in the last 168 hours. No results for input(s): LIPASE, AMYLASE in the last 168 hours. No results for input(s): AMMONIA in the last 168 hours. Coagulation Profile: No results for input(s): INR, PROTIME in the last 168 hours. Cardiac Enzymes: No results for input(s): CKTOTAL, CKMB, CKMBINDEX, TROPONINI in the last 168 hours. BNP (last 3 results) No results for input(s): PROBNP in the last 8760 hours. HbA1C: No results for input(s): HGBA1C in the last 72 hours. CBG: No results for input(s): GLUCAP in the last 168 hours. Lipid Profile: No results for input(s): CHOL, HDL, LDLCALC, TRIG, CHOLHDL, LDLDIRECT in the last 72 hours. Thyroid Function Tests: No results for input(s): TSH, T4TOTAL, FREET4, T3FREE, THYROIDAB in the last 72 hours. Anemia Panel: No results for input(s): VITAMINB12, FOLATE, FERRITIN, TIBC, IRON, RETICCTPCT in the last 72 hours. Urine analysis:    Component Value Date/Time   COLORURINE AMBER (A) 10/07/2016 2345   APPEARANCEUR CLOUDY (A) 10/07/2016 2345   LABSPEC 1.023 10/07/2016 2345   PHURINE 6.0 10/07/2016 2345   GLUCOSEU NEGATIVE 10/07/2016 2345   HGBUR TRACE (A) 10/07/2016 2345   BILIRUBINUR NEGATIVE 10/07/2016 2345   KETONESUR NEGATIVE 10/07/2016 2345   PROTEINUR NEGATIVE 10/07/2016 2345   UROBILINOGEN 1.0 10/01/2015 1225   NITRITE NEGATIVE 10/07/2016 2345   LEUKOCYTESUR NEGATIVE 10/07/2016 2345   Sepsis Labs: @LABRCNTIP (procalcitonin:4,lacticidven:4) )No results found for this or any previous visit (from the past 240 hour(s)).   Radiological Exams on Admission: Dg Chest 2 View  Result Date: 04/03/2018 CLINICAL DATA:   Worsening shortness of breath over the past 2 days. History COPD. EXAM: CHEST - 2 VIEW COMPARISON:  Chest x-ray dated June 25, 2017. FINDINGS: The heart size and mediastinal contours are within normal limits. Normal pulmonary vascularity. The lungs remain hyperinflated. No focal consolidation, pleural effusion, or  pneumothorax. No acute osseous abnormality. IMPRESSION: COPD.  No active cardiopulmonary disease. Electronically Signed   By: Obie Dredge M.D.   On: 04/03/2018 09:17    EKG: (Independently reviewed) sinus tachycardia with ventricular rate 170 bpm, QTC 401 ms, voltage criteria consistent with LVH, no acute ischemic changes  Assessment/Plan Principal Problem:   Acute respiratory failure with hypoxia/Acute bronchitis with COPD -Patient presents with increased work of breathing and hypoxia in the context of 2 days of upper respiratory illness with productive yellow-green sputum and negative chest x-ray (no pneumonia) -Improved O2 sats with application of any mass with 55% FiO2 -Continue supportive care with oxygen and nebs-been ongoing tachycardia will utilize Xopenex nebs every 6 hours with Atrovent -Albuterol neb 2.5 mg every 2 hours prn -Oral doxycycline -Oxygen-wean as tolerated-was not on oxygen prior to admission -Respiratory viral panel and influenza PCR -Admits to smoking 4 to 5 cigarettes/day-tobacco cessation counseling -Follow-up on blood cultures obtained in the ER  Active Problems:   Thrombocytopenia  -Uncertain if no finding in the absence of anemia or spurious lab result -Repeat labs in a.m.    Acute hyponatremia -Secondary to acute dehydration -Gentle IV fluid hydration -Follow labs    Elevated lactic acid level -Initial lactate normal with repeat slightly elevated at 2.12 -Suspect related to acute hypoxemia and dehydration -Repeat x1-if elevated further continue to cycle and trend -At this juncture do not suspect sepsis physiology    PTSD (post-traumatic  stress disorder)/ Anxiety -Continue home medications: Suboxone, Remeron and Effexor were    Neuropathic pain/Fibromyalgia/Chronic back pain -As above    Protein calorie malnutrition  -Last documented weight was 100 pounds in 2018 -Nutrition consultation    **Additional lab, imaging and/or diagnostic evaluation at discretion of supervising physician  DVT prophylaxis: SCDs-unable to utilize pharmacotherapy secondary to thrombocytopenia Code Status: Full Family Communication: None Disposition Plan: Home Consults called: None    ELLIS,ALLISON L. ANP-BC [facility] Pager [phone]   If 7PM-7AM, please contact night-coverage [email or web address] Password [facility]  04/03/2018, 11:25 AM

## 2018-04-03 NOTE — Progress Notes (Signed)
RT note: patient started on 10mg  Albuterol continuous nebulizer treatment at 0859.  Patient also wanted to make sure that it was known that she had a collapsed lung in 2017.  Patient currently tolerating well.  Will continue to monitor.

## 2018-04-03 NOTE — ED Notes (Signed)
Patient transported to X-ray 

## 2018-04-04 ENCOUNTER — Inpatient Hospital Stay (HOSPITAL_COMMUNITY): Payer: Medicare Other

## 2018-04-04 DIAGNOSIS — Z720 Tobacco use: Secondary | ICD-10-CM | POA: Diagnosis present

## 2018-04-04 DIAGNOSIS — E43 Unspecified severe protein-calorie malnutrition: Secondary | ICD-10-CM

## 2018-04-04 LAB — COMPREHENSIVE METABOLIC PANEL
ALT: 14 U/L (ref 14–54)
AST: 23 U/L (ref 15–41)
Albumin: 3.4 g/dL — ABNORMAL LOW (ref 3.5–5.0)
Alkaline Phosphatase: 40 U/L (ref 38–126)
Anion gap: 4 — ABNORMAL LOW (ref 5–15)
BILIRUBIN TOTAL: 0.8 mg/dL (ref 0.3–1.2)
BUN: 13 mg/dL (ref 6–20)
CO2: 30 mmol/L (ref 22–32)
Calcium: 8.9 mg/dL (ref 8.9–10.3)
Chloride: 104 mmol/L (ref 101–111)
Creatinine, Ser: 0.67 mg/dL (ref 0.44–1.00)
GFR calc Af Amer: >60 mL/min (ref >60)
Glucose, Bld: 127 mg/dL — ABNORMAL HIGH (ref 65–99)
POTASSIUM: 4.8 mmol/L (ref 3.5–5.1)
Sodium: 138 mmol/L (ref 135–145)
TOTAL PROTEIN: 6.5 g/dL (ref 6.5–8.1)

## 2018-04-04 LAB — CBC
HEMATOCRIT: 38.8 % (ref 36.0–46.0)
Hemoglobin: 12.2 g/dL (ref 12.0–15.0)
MCH: 30.1 pg (ref 26.0–34.0)
MCHC: 31.4 g/dL (ref 30.0–36.0)
MCV: 95.8 fL (ref 78.0–100.0)
Platelets: 95 10*3/uL — ABNORMAL LOW (ref 150–400)
RBC: 4.05 MIL/uL (ref 3.87–5.11)
RDW: 13 % (ref 11.5–15.5)
WBC: 8.2 10*3/uL (ref 4.0–10.5)

## 2018-04-04 LAB — HIV ANTIBODY (ROUTINE TESTING W REFLEX): HIV SCREEN 4TH GENERATION: NONREACTIVE

## 2018-04-04 LAB — GLUCOSE, CAPILLARY
GLUCOSE-CAPILLARY: 120 mg/dL — AB (ref 65–99)
GLUCOSE-CAPILLARY: 125 mg/dL — AB (ref 65–99)

## 2018-04-04 MED ORDER — MIRTAZAPINE 15 MG PO TABS
15.0000 mg | ORAL_TABLET | Freq: Once | ORAL | Status: AC
  Administered 2018-04-04: 15 mg via ORAL
  Filled 2018-04-04: qty 1

## 2018-04-04 MED ORDER — BUPRENORPHINE HCL-NALOXONE HCL 2-0.5 MG SL SUBL: 3.0000 | SUBLINGUAL_TABLET | Freq: Every day | SUBLINGUAL | Status: DC

## 2018-04-04 MED ORDER — BUDESONIDE 0.25 MG/2ML IN SUSP
0.2500 mg | Freq: Two times a day (BID) | RESPIRATORY_TRACT | Status: DC
  Administered 2018-04-04 – 2018-04-09 (×10): 0.25 mg via RESPIRATORY_TRACT
  Filled 2018-04-04 (×11): qty 2

## 2018-04-04 MED ORDER — ARFORMOTEROL TARTRATE 15 MCG/2ML IN NEBU
15.0000 ug | INHALATION_SOLUTION | Freq: Two times a day (BID) | RESPIRATORY_TRACT | Status: DC
  Administered 2018-04-04 – 2018-04-07 (×6): 15 ug via RESPIRATORY_TRACT
  Filled 2018-04-04 (×7): qty 2

## 2018-04-04 NOTE — Progress Notes (Signed)
RT has checked in with patient 3 times to give breathing treatments. The first 2 times she was eating and refused and the third time she was going down for an xray.

## 2018-04-04 NOTE — Progress Notes (Signed)
[facility]                                                                              Patient Demographics  Michele Lucas, is a 52 y.o. female, DOB - 1966/03/08, ZOX:[id number]  Admit date - 04/03/2018   Admitting Physician Pieter Partridge, MD  Outpatient Primary MD for the patient is Clare Gandy, MD  Outpatient specialists:   LOS - 1  days   Medical records reviewed and are as summarized below:    Chief Complaint  Patient presents with  . Shortness of Breath       Brief summary   Patient is a 52 year old female with history of COPD, not on O2, nicotine abuse, PTSD/anxiety disorder, chronic pain/fibromyalgia, chronic malnutrition presented with 2-day history of shortness of breath, productive cough with yellowish-greenish sputum, denied any fevers.  Patient was found to have diffuse wheezing by EMS, O2 sats were down to 86%.  Chest x-ray showed no focal infiltrates.  Patient was admitted for further work-up.   Assessment & Plan    Principal Problem:   COPD acute respiratory failure with hypoxia exacerbation (HCC) in the setting of continued tobacco use, metapneumovirus. -Chest x-ray negative for infiltrates, O2 sats 100% on 5 L, wean O2 as tolerated -Follow blood cultures, influenza panel positive for metapneumovirus, flu negative -Patient declines oral prednisone or IV solumedrol. States it causes "stomach pain" and she doesn't pain meds.  Placed on Pulmicort, Brovana, doxycycline, flutter valve -Counseled strongly on nicotine cessation.  Active Problems:  Lactic acidosis with dehydration -Continue IV fluid hydration, lactic acid 2.12 at the time of admission, improving -No sepsis physiology.    Chronic back pain -Patient states that it took a few years to wean off narcotics, currently on Suboxone, will continue    Anxiety with underlying history of PTSD -Continue Remeron, Effexor    Thrombocytopenia (HCC) -Follow closely, continue  SCDs. No anemia, H/H close to baseline      Acute hyponatremia -Likely due to dehydration, improved with IV fluids.  Sodium 132 at the time of admission, improved to 138    Protein-calorie malnutrition, severe Nutrition consult     Nicotine abuse -Patient counseled on smoking cessation, based on nicotine patch.  Code Status: full  DVT Prophylaxis:  SCD's Family Communication: Discussed in detail with the patient, all imaging results, lab results explained to the patient   Disposition Plan: when medically ready in 24 to 48 hours  Time Spent in minutes   35 minutes  Procedures:  Chest x-ray  Consultants:   None  Antimicrobials:   Doxycycline 4/26   Medications  Scheduled Meds: . arformoterol  15 mcg Nebulization BID  . budesonide (PULMICORT) nebulizer solution  0.25 mg Nebulization BID  . buprenorphine-naloxone  1 tablet Sublingual Daily  . doxycycline  100 mg Oral Q12H  . feeding supplement (ENSURE ENLIVE)  237 mL Oral TID BM  . ipratropium  0.5 mg Nebulization Q6H  . levalbuterol  0.63 mg Nebulization Q6H  . mirtazapine  15 mg Oral QHS  . nicotine  21 mg Transdermal Daily  . sodium chloride flush  3 mL Intravenous Q12H  .  venlafaxine  75 mg Oral BID   Continuous Infusions: . sodium chloride 75 mL/hr at 04/04/18 0954   PRN Meds:.acetaminophen **OR** acetaminophen, albuterol, ondansetron **OR** ondansetron (ZOFRAN) IV   Antibiotics   Anti-infectives (From admission, onward)   Start     Dose/Rate Route Frequency Ordered Stop   04/04/18 1130  doxycycline (VIBRA-TABS) tablet 100 mg     100 mg Oral Every 12 hours 04/03/18 1058     04/03/18 0930  cefTRIAXone (ROCEPHIN) 1 g in sodium chloride 0.9 % 100 mL IVPB     1 g 200 mL/hr over 30 Minutes Intravenous  Once 04/03/18 0922 04/03/18 1036   04/03/18 0930  azithromycin (ZITHROMAX) tablet 500 mg     500 mg Oral  Once 04/03/18 1324 04/03/18 4010        Subjective:   [name] was seen and examined today.   Wheezing improving, still on 5 L O2, states steroids makes her stomach hurt.  No chest pain.  No fevers or chills.  Patient denies dizziness, N/V/D/C, new weakness, numbess, tingling. No acute events overnight.    Objective:   Vitals:   04/04/18 0142 04/04/18 0256 04/04/18 0800 04/04/18 0927  BP:    125/68  Pulse: 73  (!) 109 82  Resp: 20   18  Temp:   97.7 F (36.5 C) 98.1 F (36.7 C)  TempSrc:    Oral  SpO2: 95%  93% 100%  Weight:  41.2 kg (90 lb 13.3 oz)    Height:        Intake/Output Summary (Last 24 hours) at 04/04/2018 1016 Last data filed at 04/04/2018 0300 Gross per 24 hour  Intake 1708 ml  Output 500 ml  Net 1208 ml     Wt Readings from Last 3 Encounters:  04/04/18 41.2 kg (90 lb 13.3 oz)  11/05/17 45.4 kg (100 lb)  06/24/17 40.8 kg (90 lb)     Exam  General: Alert and oriented x 3, NAD, anxious  Eyes:   HEENT:  Atraumatic, normocephalic  Cardiovascular: S1 S2 auscultated,  Regular rate and rhythm.  Respiratory: Mild scattered wheezing bilaterally  Gastrointestinal: Soft, nontender, nondistended, + bowel sounds  Ext: no pedal edema bilaterally  Neuro: no new deficits  Musculoskeletal: No digital cyanosis, clubbing  Skin: No rashes  Psych: anxious alert and oriented x3    Data Reviewed:  I have personally reviewed following labs and imaging studies  Micro Results Recent Results (from the past 240 hour(s))  Blood culture (routine x 2)     Status: None (Preliminary result)   Collection Time: 04/03/18 10:10 AM  Result Value Ref Range Status   Specimen Description BLOOD RIGHT HAND  Final   Special Requests   Final    BOTTLES DRAWN AEROBIC AND ANAEROBIC Blood Culture adequate volume   Culture   Final    NO GROWTH < 24 HOURS Performed at [facility], [address] [place], [place] [address]    Report Status PENDING  Incomplete  Respiratory Panel by PCR     Status: Abnormal   Collection Time: 04/03/18  1:17 PM  Result Value Ref  Range Status   Adenovirus NOT DETECTED NOT DETECTED Final   Coronavirus 229E NOT DETECTED NOT DETECTED Final   Coronavirus HKU1 NOT DETECTED NOT DETECTED Final   Coronavirus NL63 NOT DETECTED NOT DETECTED Final   Coronavirus OC43 NOT DETECTED NOT DETECTED Final   Metapneumovirus DETECTED (A) NOT DETECTED Final   Rhinovirus / Enterovirus NOT  DETECTED NOT DETECTED Final   Influenza A NOT DETECTED NOT DETECTED Final   Influenza B NOT DETECTED NOT DETECTED Final   Parainfluenza Virus 1 NOT DETECTED NOT DETECTED Final   Parainfluenza Virus 2 NOT DETECTED NOT DETECTED Final   Parainfluenza Virus 3 NOT DETECTED NOT DETECTED Final   Parainfluenza Virus 4 NOT DETECTED NOT DETECTED Final   Respiratory Syncytial Virus NOT DETECTED NOT DETECTED Final   Bordetella pertussis NOT DETECTED NOT DETECTED Final   Chlamydophila pneumoniae NOT DETECTED NOT DETECTED Final   Mycoplasma pneumoniae NOT DETECTED NOT DETECTED Final    Radiology Reports Dg Chest 2 View  Result Date: 04/03/2018 CLINICAL DATA:  Worsening shortness of breath over the past 2 days. History COPD. EXAM: CHEST - 2 VIEW COMPARISON:  Chest x-ray dated June 25, 2017. FINDINGS: The heart size and mediastinal contours are within normal limits. Normal pulmonary vascularity. The lungs remain hyperinflated. No focal consolidation, pleural effusion, or pneumothorax. No acute osseous abnormality. IMPRESSION: COPD.  No active cardiopulmonary disease. Electronically Signed   By: Obie Dredge M.D.   On: 04/03/2018 09:17    Lab Data:  CBC: Recent Labs  Lab 04/03/18 0855 04/04/18 0340  WBC 6.4 8.2  NEUTROABS 4.7  --   HGB 12.9 12.2  HCT 39.8 38.8  MCV 94.8 95.8  PLT 96* 95*   Basic Metabolic Panel: Recent Labs  Lab 04/03/18 0855 04/04/18 0340  NA 132* 138  K 4.5 4.8  CL 96* 104  CO2 24 30  GLUCOSE 117* 127*  BUN 9 13  CREATININE 0.72 0.67  CALCIUM 9.1 8.9   GFR: Estimated Creatinine Clearance: 52.9 mL/min (by C-G formula  based on SCr of 0.67 mg/dL). Liver Function Tests: Recent Labs  Lab 04/04/18 0340  AST 23  ALT 14  ALKPHOS 40  BILITOT 0.8  PROT 6.5  ALBUMIN 3.4*   No results for input(s): LIPASE, AMYLASE in the last 168 hours. No results for input(s): AMMONIA in the last 168 hours. Coagulation Profile: No results for input(s): INR, PROTIME in the last 168 hours. Cardiac Enzymes: No results for input(s): CKTOTAL, CKMB, CKMBINDEX, TROPONINI in the last 168 hours. BNP (last 3 results) No results for input(s): PROBNP in the last 8760 hours. HbA1C: No results for input(s): HGBA1C in the last 72 hours. CBG: Recent Labs  Lab 04/04/18 0628 04/04/18 0731  GLUCAP 120* 125*   Lipid Profile: No results for input(s): CHOL, HDL, LDLCALC, TRIG, CHOLHDL, LDLDIRECT in the last 72 hours. Thyroid Function Tests: No results for input(s): TSH, T4TOTAL, FREET4, T3FREE, THYROIDAB in the last 72 hours. Anemia Panel: No results for input(s): VITAMINB12, FOLATE, FERRITIN, TIBC, IRON, RETICCTPCT in the last 72 hours. Urine analysis:    Component Value Date/Time   COLORURINE AMBER (A) 10/07/2016 2345   APPEARANCEUR CLOUDY (A) 10/07/2016 2345   LABSPEC 1.023 10/07/2016 2345   PHURINE 6.0 10/07/2016 2345   GLUCOSEU NEGATIVE 10/07/2016 2345   HGBUR TRACE (A) 10/07/2016 2345   BILIRUBINUR NEGATIVE 10/07/2016 2345   KETONESUR NEGATIVE 10/07/2016 2345   PROTEINUR NEGATIVE 10/07/2016 2345   UROBILINOGEN 1.0 10/01/2015 1225   NITRITE NEGATIVE 10/07/2016 2345   LEUKOCYTESUR NEGATIVE 10/07/2016 2345     Ripudeep Rai M.D. [facility] 04/04/2018, 10:16 AM  Pager: [phone] Between 7am to 7pm - call Pager - [phone]  After 7pm go to [email or web address] - password [email or web address]  Call night coverage person covering after 7pm

## 2018-04-05 LAB — GLUCOSE, CAPILLARY: GLUCOSE-CAPILLARY: 68 mg/dL (ref 65–99)

## 2018-04-05 LAB — EXPECTORATED SPUTUM ASSESSMENT W REFEX TO RESP CULTURE

## 2018-04-05 MED ORDER — BENZONATATE 100 MG PO CAPS
200.0000 mg | ORAL_CAPSULE | Freq: Three times a day (TID) | ORAL | Status: DC
  Administered 2018-04-05 – 2018-04-13 (×24): 200 mg via ORAL
  Filled 2018-04-05 (×25): qty 2

## 2018-04-05 NOTE — Progress Notes (Signed)
[facility]                                                                              Patient Demographics  Michele Lucas, is a 52 y.o. female, DOB - [birth year], ZOX:[id number]  Admit date - 04/03/2018   Admitting Physician Pieter Partridge, MD  Outpatient Primary MD for the patient is Clare Gandy, MD  Outpatient specialists:   LOS - 2  days   Medical records reviewed and are as summarized below:    Chief Complaint  Patient presents with  . Shortness of Breath       Brief summary   Patient is a 52 year old female with history of COPD, not on O2, nicotine abuse, PTSD/anxiety disorder, chronic pain/fibromyalgia, chronic malnutrition presented with 2-day history of shortness of breath, productive cough with yellowish-greenish sputum, denied any fevers.  Patient was found to have diffuse wheezing by EMS, O2 sats were down to 86%.  Chest x-ray showed no focal infiltrates.  Patient was admitted for further work-up.   Assessment & Plan    Principal Problem:   COPD acute respiratory failure with hypoxia exacerbation (HCC) in the setting of continued tobacco use, metapneumovirus. -Chest x-ray negative for infiltrates, 9% on 5 L, wean O2 as tolerated. -Follow blood cultures, influenza panel positive for metapneumovirus, flu negative -Patient declines oral prednisone or IV solumedrol. States it causes "stomach pain" and she doesn't pain meds.   -Continue Pulmicort, Brovana, doxycycline, flutter valve -Counseled strongly on nicotine cessation.  Active Problems:  Lactic acidosis with dehydration -Hydration, lactic acid 2.1 at the time of admission, improving.   -No sepsis physiology.    Chronic back pain -Patient states that it took a few years to wean off narcotics, currently on Suboxone, will continue    Anxiety with underlying history of PTSD -Continue Remeron, Effexor    Thrombocytopenia (HCC) -Follow closely, continue SCDs. No anemia, H/H  close to baseline      Acute hyponatremia -Likely due to dehydration, improved with IV fluids.  Sodium 132 at the time of admission,  -Improved.    Protein-calorie malnutrition, severe Nutrition consult     Nicotine abuse -Patient counseled on smoking cessation, based on nicotine patch.  Code Status: full  DVT Prophylaxis:  SCD's Family Communication: Discussed in detail with the patient, all imaging results, lab results explained to the patient   Disposition Plan: when medically ready in 24 to 48 hours  Time Spent in minutes 25-minutes  Procedures:  Chest x-ray  Consultants:   None  Antimicrobials:   Doxycycline 4/26   Medications  Scheduled Meds: . arformoterol  15 mcg Nebulization BID  . budesonide (PULMICORT) nebulizer solution  0.25 mg Nebulization BID  . buprenorphine-naloxone  1 tablet Sublingual Daily  . doxycycline  100 mg Oral Q12H  . feeding supplement (ENSURE ENLIVE)  237 mL Oral TID BM  . ipratropium  0.5 mg Nebulization Q6H  . levalbuterol  0.63 mg Nebulization Q6H  . nicotine  21 mg Transdermal Daily  . sodium chloride flush  3 mL Intravenous Q12H  . venlafaxine  75 mg Oral BID   Continuous Infusions: . sodium chloride  75 mL/hr (04/05/18 0540)   PRN Meds:.acetaminophen **OR** acetaminophen, albuterol, ondansetron **OR** ondansetron (ZOFRAN) IV   Antibiotics   Anti-infectives (From admission, onward)   Start     Dose/Rate Route Frequency Ordered Stop   04/04/18 1130  doxycycline (VIBRA-TABS) tablet 100 mg     100 mg Oral Every 12 hours 04/03/18 1058     04/03/18 0930  cefTRIAXone (ROCEPHIN) 1 g in sodium chloride 0.9 % 100 mL IVPB     1 g 200 mL/hr over 30 Minutes Intravenous  Once 04/03/18 0922 04/03/18 1036   04/03/18 0930  azithromycin (ZITHROMAX) tablet 500 mg     500 mg Oral  Once 04/03/18 4098 04/03/18 1191        Subjective:   Michele Lucas was seen and examined today.  Feeling poorly today, still on 5 L, coughing.  No fevers.   Wheezing and shortness of breath.  No chest pain. Patient denies dizziness, N/V/D/C, new weakness, numbess, tingling.  No acute issues overnight  Objective:   Vitals:   04/05/18 0122 04/05/18 0500 04/05/18 0756 04/05/18 0934  BP:    129/84  Pulse: 94   92  Resp: 18   18  Temp:    97.8 F (36.6 C)  TempSrc:    Oral  SpO2: 93%  97% 99%  Weight:  41.3 kg (91 lb 0.8 oz)    Height:        Intake/Output Summary (Last 24 hours) at 04/05/2018 1315 Last data filed at 04/05/2018 0100 Gross per 24 hour  Intake 648 ml  Output -  Net 648 ml     Wt Readings from Last 3 Encounters:  04/05/18 41.3 kg (91 lb 0.8 oz)  11/05/17 45.4 kg (100 lb)  06/24/17 40.8 kg (90 lb)     Exam   General: Alert and oriented x 3, NAD  Eyes:   HEENT:    Cardiovascular: S1 S2 auscultated, Regular rate and rhythm. No pedal edema b/l  Respiratory: Bilateral diffuse rhonchi  Gastrointestinal: Soft, nontender, nondistended, + bowel sounds  Ext: no pedal edema bilaterally  Neuro: no new deficits  Musculoskeletal: No digital cyanosis, clubbing  Skin: No rashes  Psych: Normal affect and demeanor, alert and oriented x3     Data Reviewed:  I have personally reviewed following labs and imaging studies  Micro Results Recent Results (from the past 240 hour(s))  Blood culture (routine x 2)     Status: None (Preliminary result)   Collection Time: 04/03/18  8:55 AM  Result Value Ref Range Status   Specimen Description BLOOD LEFT WRIST  Final   Special Requests   Final    BOTTLES DRAWN AEROBIC AND ANAEROBIC Blood Culture adequate volume   Culture   Final    NO GROWTH 2 DAYS Performed at [facility], [address] [place], [place] [address]    Report Status PENDING  Incomplete  Blood culture (routine x 2)     Status: None (Preliminary result)   Collection Time: 04/03/18 10:10 AM  Result Value Ref Range Status   Specimen Description BLOOD RIGHT HAND  Final   Special Requests   Final     BOTTLES DRAWN AEROBIC AND ANAEROBIC Blood Culture adequate volume   Culture   Final    NO GROWTH 2 DAYS Performed at [facility], [address], [place], [place] [address]    Report Status PENDING  Incomplete  Respiratory Panel by PCR     Status: Abnormal  Collection Time: 04/03/18  1:17 PM  Result Value Ref Range Status   Adenovirus NOT DETECTED NOT DETECTED Final   Coronavirus 229E NOT DETECTED NOT DETECTED Final   Coronavirus HKU1 NOT DETECTED NOT DETECTED Final   Coronavirus NL63 NOT DETECTED NOT DETECTED Final   Coronavirus OC43 NOT DETECTED NOT DETECTED Final   Metapneumovirus DETECTED (A) NOT DETECTED Final   Rhinovirus / Enterovirus NOT DETECTED NOT DETECTED Final   Influenza A NOT DETECTED NOT DETECTED Final   Influenza B NOT DETECTED NOT DETECTED Final   Parainfluenza Virus 1 NOT DETECTED NOT DETECTED Final   Parainfluenza Virus 2 NOT DETECTED NOT DETECTED Final   Parainfluenza Virus 3 NOT DETECTED NOT DETECTED Final   Parainfluenza Virus 4 NOT DETECTED NOT DETECTED Final   Respiratory Syncytial Virus NOT DETECTED NOT DETECTED Final   Bordetella pertussis NOT DETECTED NOT DETECTED Final   Chlamydophila pneumoniae NOT DETECTED NOT DETECTED Final   Mycoplasma pneumoniae NOT DETECTED NOT DETECTED Final    Radiology Reports Dg Chest 2 View  Result Date: 04/04/2018 CLINICAL DATA:  Acute bronchitis.  COPD. EXAM: CHEST - 2 VIEW COMPARISON:  04/03/2018 and 06/25/2017 FINDINGS: The heart size and mediastinal contours are within normal limits. Pulmonary hyperinflation again seen, consistent with COPD. Left basilar scarring is again noted. No evidence of pulmonary infiltrate or edema. No evidence of pleural effusion. The visualized skeletal structures are unremarkable. IMPRESSION: Stable left lower lobe scarring and COPD.  No active lung disease. Electronically Signed   By: Myles Rosenthal M.D.   On: 04/04/2018 13:23   Dg Chest 2 View  Result Date: 04/03/2018 CLINICAL DATA:   Worsening shortness of breath over the past 2 days. History COPD. EXAM: CHEST - 2 VIEW COMPARISON:  Chest x-ray dated June 25, 2017. FINDINGS: The heart size and mediastinal contours are within normal limits. Normal pulmonary vascularity. The lungs remain hyperinflated. No focal consolidation, pleural effusion, or pneumothorax. No acute osseous abnormality. IMPRESSION: COPD.  No active cardiopulmonary disease. Electronically Signed   By: Obie Dredge M.D.   On: 04/03/2018 09:17    Lab Data:  CBC: Recent Labs  Lab 04/03/18 0855 04/04/18 0340  WBC 6.4 8.2  NEUTROABS 4.7  --   HGB 12.9 12.2  HCT 39.8 38.8  MCV 94.8 95.8  PLT 96* 95*   Basic Metabolic Panel: Recent Labs  Lab 04/03/18 0855 04/04/18 0340  NA 132* 138  K 4.5 4.8  CL 96* 104  CO2 24 30  GLUCOSE 117* 127*  BUN 9 13  CREATININE 0.72 0.67  CALCIUM 9.1 8.9   GFR: Estimated Creatinine Clearance: 53 mL/min (by C-G formula based on SCr of 0.67 mg/dL). Liver Function Tests: Recent Labs  Lab 04/04/18 0340  AST 23  ALT 14  ALKPHOS 40  BILITOT 0.8  PROT 6.5  ALBUMIN 3.4*   No results for input(s): LIPASE, AMYLASE in the last 168 hours. No results for input(s): AMMONIA in the last 168 hours. Coagulation Profile: No results for input(s): INR, PROTIME in the last 168 hours. Cardiac Enzymes: No results for input(s): CKTOTAL, CKMB, CKMBINDEX, TROPONINI in the last 168 hours. BNP (last 3 results) No results for input(s): PROBNP in the last 8760 hours. HbA1C: No results for input(s): HGBA1C in the last 72 hours. CBG: Recent Labs  Lab 04/04/18 0628 04/04/18 0731 04/05/18 0711  GLUCAP 120* 125* 68   Lipid Profile: No results for input(s): CHOL, HDL, LDLCALC, TRIG, CHOLHDL, LDLDIRECT in the last 72 hours. Thyroid Function Tests:  No results for input(s): TSH, T4TOTAL, FREET4, T3FREE, THYROIDAB in the last 72 hours. Anemia Panel: No results for input(s): VITAMINB12, FOLATE, FERRITIN, TIBC, IRON, RETICCTPCT in  the last 72 hours. Urine analysis:    Component Value Date/Time   COLORURINE AMBER (A) 10/07/2016 2345   APPEARANCEUR CLOUDY (A) 10/07/2016 2345   LABSPEC 1.023 10/07/2016 2345   PHURINE 6.0 10/07/2016 2345   GLUCOSEU NEGATIVE 10/07/2016 2345   HGBUR TRACE (A) 10/07/2016 2345   BILIRUBINUR NEGATIVE 10/07/2016 2345   KETONESUR NEGATIVE 10/07/2016 2345   PROTEINUR NEGATIVE 10/07/2016 2345   UROBILINOGEN 1.0 10/01/2015 1225   NITRITE NEGATIVE 10/07/2016 2345   LEUKOCYTESUR NEGATIVE 10/07/2016 2345     Ripudeep Rai M.D. [facility] 04/05/2018, 1:15 PM  Pager: [phone] Between 7am to 7pm - call Pager - [phone]  After 7pm go to [email or web address] - password [email or web address]  Call night coverage person covering after 7pm

## 2018-04-06 LAB — GLUCOSE, CAPILLARY: Glucose-Capillary: 84 mg/dL (ref 65–99)

## 2018-04-06 MED ORDER — IPRATROPIUM BROMIDE 0.02 % IN SOLN
0.5000 mg | RESPIRATORY_TRACT | Status: DC
  Administered 2018-04-06 – 2018-04-07 (×9): 0.5 mg via RESPIRATORY_TRACT
  Filled 2018-04-06 (×9): qty 2.5

## 2018-04-06 MED ORDER — ONDANSETRON HCL 4 MG/2ML IJ SOLN: 4.0000 mg | Freq: Once | INTRAMUSCULAR | Status: DC

## 2018-04-06 MED ORDER — SENNOSIDES-DOCUSATE SODIUM 8.6-50 MG PO TABS
2.0000 | ORAL_TABLET | Freq: Every evening | ORAL | Status: DC | PRN
  Administered 2018-04-06: 2 via ORAL
  Filled 2018-04-06 (×3): qty 2

## 2018-04-06 MED ORDER — LEVALBUTEROL HCL 0.63 MG/3ML IN NEBU
0.6300 mg | INHALATION_SOLUTION | RESPIRATORY_TRACT | Status: DC
  Administered 2018-04-06 – 2018-04-07 (×9): 0.63 mg via RESPIRATORY_TRACT
  Filled 2018-04-06 (×9): qty 3

## 2018-04-06 NOTE — Progress Notes (Signed)
[facility]                                                                              Patient Demographics  Michele Lucas, is a 52 y.o. female, DOB - [birth year], JWJ:[id number]  Admit date - 04/03/2018   Admitting Physician Pieter Partridge, MD  Outpatient Primary MD for the patient is Clare Gandy, MD  Outpatient specialists:   LOS - 3  days   Medical records reviewed and are as summarized below:    Chief Complaint  Patient presents with  . Shortness of Breath       Brief summary   Patient is a 52 year old female with history of COPD, not on O2, nicotine abuse, PTSD/anxiety disorder, chronic pain/fibromyalgia, chronic malnutrition presented with 2-day history of shortness of breath, productive cough with yellowish-greenish sputum, denied any fevers.  Patient was found to have diffuse wheezing by EMS, O2 sats were down to 86%.  Chest x-ray showed no focal infiltrates.  Patient was admitted for further work-up.   Assessment & Plan    Principal Problem:   COPD acute respiratory failure with hypoxia exacerbation (HCC) in the setting of continued tobacco use, metapneumovirus. -Chest x-ray negative for infiltrates, -Currently O2 sats 100% on 4 L, does not use oxygen at home, wean as tolerated.  -Respiratory virus panel positive for metapneumovirus, flu negative.  Blood cultures negative so far -Follow sputum cultures -Patient declines oral prednisone or IV solumedrol. States it causes "stomach pain" and she doesn't pain meds.   -Continue Pulmicort, Brovana, doxycycline, flutter valve.  Continue Xopenex, Atrovent nebs -Counseled strongly on nicotine cessation. -Obtain 2D echo, EKG showed LVH with repolarization changes.  BNP 16, no CHF.  Active Problems:  Lactic acidosis with dehydration -Lactic acid improved, DC IV fluids. -No sepsis physiology.    Chronic back pain -Patient states that it took a few years to wean off narcotics, currently on  Suboxone, will continue    Anxiety with underlying history of PTSD -Continue Remeron, Effexor    Thrombocytopenia (HCC) -Follow closely, continue SCDs. No anemia, H/H close to baseline      Acute hyponatremia, likely due to dehydration -Improved IV fluids.    Protein-calorie malnutrition, severe Nutrition consult     Nicotine abuse -Patient counseled on smoking cessation, based on nicotine patch.  Code Status: full  DVT Prophylaxis:  SCD's Family Communication: Discussed in detail with the patient, all imaging results, lab results explained to the patient   Disposition Plan: Hopefully next 24 hours  Time Spent in minutes 25-minutes  Procedures:  Chest x-ray  Consultants:   None  Antimicrobials:   Doxycycline 4/26   Medications  Scheduled Meds: . arformoterol  15 mcg Nebulization BID  . benzonatate  200 mg Oral TID  . budesonide (PULMICORT) nebulizer solution  0.25 mg Nebulization BID  . buprenorphine-naloxone  1 tablet Sublingual Daily  . doxycycline  100 mg Oral Q12H  . feeding supplement (ENSURE ENLIVE)  237 mL Oral TID BM  . ipratropium  0.5 mg Nebulization Q4H  . levalbuterol  0.63 mg Nebulization Q4H  . nicotine  21 mg Transdermal Daily  . ondansetron (  ZOFRAN) IV  4 mg Intravenous Once  . sodium chloride flush  3 mL Intravenous Q12H  . venlafaxine  75 mg Oral BID   Continuous Infusions: . sodium chloride 75 mL/hr at 04/05/18 1821   PRN Meds:.acetaminophen **OR** acetaminophen, albuterol, ondansetron **OR** ondansetron (ZOFRAN) IV   Antibiotics   Anti-infectives (From admission, onward)   Start     Dose/Rate Route Frequency Ordered Stop   04/04/18 1130  doxycycline (VIBRA-TABS) tablet 100 mg     100 mg Oral Every 12 hours 04/03/18 1058     04/03/18 0930  cefTRIAXone (ROCEPHIN) 1 g in sodium chloride 0.9 % 100 mL IVPB     1 g 200 mL/hr over 30 Minutes Intravenous  Once 04/03/18 0922 04/03/18 1036   04/03/18 0930  azithromycin (ZITHROMAX) tablet  500 mg     500 mg Oral  Once 04/03/18 1610 04/03/18 9604        Subjective:   Michele Lucas was seen and examined today.  States she is still feeling poorly, has rhonchi, coughing.  No fevers or chills.  Nausea with vomiting x1 episode.   Patient denies dizziness, N/V/D/C, new weakness, numbess, tingling.    Objective:   Vitals:   04/05/18 2305 04/06/18 0145 04/06/18 0735 04/06/18 0818  BP: (!) 154/83   (!) 143/77  Pulse: 90   (!) 122  Resp:      Temp: 98.5 F (36.9 C)     TempSrc: Oral     SpO2: 97% 98% 98% 100%  Weight:      Height:        Intake/Output Summary (Last 24 hours) at 04/06/2018 1151 Last data filed at 04/05/2018 1800 Gross per 24 hour  Intake 525 ml  Output -  Net 525 ml     Wt Readings from Last 3 Encounters:  04/05/18 41.3 kg (91 lb 0.8 oz)  11/05/17 45.4 kg (100 lb)  06/24/17 40.8 kg (90 lb)     Exam  General: Alert and oriented x 3, NAD, anxious  Eyes:  HEENT:  Atraumatic, normocephalic, normal oropharynx Cardiovascular: S1-S2 clear, RRR, no pedal edema Respiratory: Bilateral rhonchi Gastrointestinal: Soft, nontender, nondistended, + bowel sounds Ext: no pedal edema bilaterally Neuro: no new deficits Musculoskeletal: No digital cyanosis, clubbing Skin: No rashes Psych: anxious, alert and oriented x3       Data Reviewed:  I have personally reviewed following labs and imaging studies  Micro Results Recent Results (from the past 240 hour(s))  Blood culture (routine x 2)     Status: None (Preliminary result)   Collection Time: 04/03/18  8:55 AM  Result Value Ref Range Status   Specimen Description BLOOD LEFT WRIST  Final   Special Requests   Final    BOTTLES DRAWN AEROBIC AND ANAEROBIC Blood Culture adequate volume   Culture   Final    NO GROWTH 2 DAYS Performed at [facility], [address], [place], [place] [address]    Report Status PENDING  Incomplete  Blood culture (routine x 2)     Status: None (Preliminary result)     Collection Time: 04/03/18 10:10 AM  Result Value Ref Range Status   Specimen Description BLOOD RIGHT HAND  Final   Special Requests   Final    BOTTLES DRAWN AEROBIC AND ANAEROBIC Blood Culture adequate volume   Culture   Final    NO GROWTH 2 DAYS Performed at [facility], [address] [place], [place] [address]  Report Status PENDING  Incomplete  Respiratory Panel by PCR     Status: Abnormal   Collection Time: 04/03/18  1:17 PM  Result Value Ref Range Status   Adenovirus NOT DETECTED NOT DETECTED Final   Coronavirus 229E NOT DETECTED NOT DETECTED Final   Coronavirus HKU1 NOT DETECTED NOT DETECTED Final   Coronavirus NL63 NOT DETECTED NOT DETECTED Final   Coronavirus OC43 NOT DETECTED NOT DETECTED Final   Metapneumovirus DETECTED (A) NOT DETECTED Final   Rhinovirus / Enterovirus NOT DETECTED NOT DETECTED Final   Influenza A NOT DETECTED NOT DETECTED Final   Influenza B NOT DETECTED NOT DETECTED Final   Parainfluenza Virus 1 NOT DETECTED NOT DETECTED Final   Parainfluenza Virus 2 NOT DETECTED NOT DETECTED Final   Parainfluenza Virus 3 NOT DETECTED NOT DETECTED Final   Parainfluenza Virus 4 NOT DETECTED NOT DETECTED Final   Respiratory Syncytial Virus NOT DETECTED NOT DETECTED Final   Bordetella pertussis NOT DETECTED NOT DETECTED Final   Chlamydophila pneumoniae NOT DETECTED NOT DETECTED Final   Mycoplasma pneumoniae NOT DETECTED NOT DETECTED Final  Culture, expectorated sputum-assessment     Status: None   Collection Time: 04/04/18  9:32 PM  Result Value Ref Range Status   Specimen Description SPUTUM  Final   Special Requests NONE  Final   Sputum evaluation   Final    THIS SPECIMEN IS ACCEPTABLE FOR SPUTUM CULTURE Performed at [facility], [address], [place], [place] [address]    Report Status 04/05/2018 FINAL  Final  Culture, respiratory (NON-Expectorated)     Status: None (Preliminary result)   Collection Time: 04/04/18  9:32 PM  Result Value Ref  Range Status   Specimen Description SPUTUM  Final   Special Requests NONE Reflexed from [id number]  Final   Gram Stain   Final    MODERATE WBC PRESENT, PREDOMINANTLY MONONUCLEAR FEW SQUAMOUS EPITHELIAL CELLS PRESENT FEW GRAM POSITIVE COCCI IN PAIRS RARE BUDDING YEAST SEEN    Culture   Final    CULTURE REINCUBATED FOR BETTER GROWTH Performed at [facility], [address] [place], [place] [address]    Report Status PENDING  Incomplete    Radiology Reports Dg Chest 2 View  Result Date: 04/04/2018 CLINICAL DATA:  Acute bronchitis.  COPD. EXAM: CHEST - 2 VIEW COMPARISON:  04/03/2018 and 06/25/2017 FINDINGS: The heart size and mediastinal contours are within normal limits. Pulmonary hyperinflation again seen, consistent with COPD. Left basilar scarring is again noted. No evidence of pulmonary infiltrate or edema. No evidence of pleural effusion. The visualized skeletal structures are unremarkable. IMPRESSION: Stable left lower lobe scarring and COPD.  No active lung disease. Electronically Signed   By: Myles Rosenthal M.D.   On: 04/04/2018 13:23   Dg Chest 2 View  Result Date: 04/03/2018 CLINICAL DATA:  Worsening shortness of breath over the past 2 days. History COPD. EXAM: CHEST - 2 VIEW COMPARISON:  Chest x-ray dated June 25, 2017. FINDINGS: The heart size and mediastinal contours are within normal limits. Normal pulmonary vascularity. The lungs remain hyperinflated. No focal consolidation, pleural effusion, or pneumothorax. No acute osseous abnormality. IMPRESSION: COPD.  No active cardiopulmonary disease. Electronically Signed   By: Obie Dredge M.D.   On: 04/03/2018 09:17    Lab Data:  CBC: Recent Labs  Lab 04/03/18 0855 04/04/18 0340  WBC 6.4 8.2  NEUTROABS 4.7  --   HGB 12.9 12.2  HCT 39.8 38.8  MCV 94.8 95.8  PLT 96*  95*   Basic Metabolic Panel: Recent Labs  Lab 04/03/18 0855 04/04/18 0340  NA 132* 138  K 4.5 4.8  CL 96* 104  CO2 24 30  GLUCOSE 117* 127*  BUN 9  13  CREATININE 0.72 0.67  CALCIUM 9.1 8.9   GFR: Estimated Creatinine Clearance: 53 mL/min (by C-G formula based on SCr of 0.67 mg/dL). Liver Function Tests: Recent Labs  Lab 04/04/18 0340  AST 23  ALT 14  ALKPHOS 40  BILITOT 0.8  PROT 6.5  ALBUMIN 3.4*   No results for input(s): LIPASE, AMYLASE in the last 168 hours. No results for input(s): AMMONIA in the last 168 hours. Coagulation Profile: No results for input(s): INR, PROTIME in the last 168 hours. Cardiac Enzymes: No results for input(s): CKTOTAL, CKMB, CKMBINDEX, TROPONINI in the last 168 hours. BNP (last 3 results) No results for input(s): PROBNP in the last 8760 hours. HbA1C: No results for input(s): HGBA1C in the last 72 hours. CBG: Recent Labs  Lab 04/04/18 0628 04/04/18 0731 04/05/18 0711 04/06/18 0813  GLUCAP 120* 125* 68 84   Lipid Profile: No results for input(s): CHOL, HDL, LDLCALC, TRIG, CHOLHDL, LDLDIRECT in the last 72 hours. Thyroid Function Tests: No results for input(s): TSH, T4TOTAL, FREET4, T3FREE, THYROIDAB in the last 72 hours. Anemia Panel: No results for input(s): VITAMINB12, FOLATE, FERRITIN, TIBC, IRON, RETICCTPCT in the last 72 hours. Urine analysis:    Component Value Date/Time   COLORURINE AMBER (A) 10/07/2016 2345   APPEARANCEUR CLOUDY (A) 10/07/2016 2345   LABSPEC 1.023 10/07/2016 2345   PHURINE 6.0 10/07/2016 2345   GLUCOSEU NEGATIVE 10/07/2016 2345   HGBUR TRACE (A) 10/07/2016 2345   BILIRUBINUR NEGATIVE 10/07/2016 2345   KETONESUR NEGATIVE 10/07/2016 2345   PROTEINUR NEGATIVE 10/07/2016 2345   UROBILINOGEN 1.0 10/01/2015 1225   NITRITE NEGATIVE 10/07/2016 2345   LEUKOCYTESUR NEGATIVE 10/07/2016 2345     Michele Lucas M.D. [facility] 04/06/2018, 11:51 AM  Pager: [phone] Between 7am to 7pm - call Pager - [phone]  After 7pm go to [email or web address] - password [email or web address]  Call night coverage person covering after 7pm

## 2018-04-07 ENCOUNTER — Inpatient Hospital Stay (HOSPITAL_COMMUNITY): Payer: Medicare Other

## 2018-04-07 DIAGNOSIS — R0600 Dyspnea, unspecified: Secondary | ICD-10-CM

## 2018-04-07 LAB — GLUCOSE, CAPILLARY: Glucose-Capillary: 82 mg/dL (ref 65–99)

## 2018-04-07 LAB — ECHOCARDIOGRAM COMPLETE
HEIGHTINCHES: 62 in
WEIGHTICAEL: 1449.74 [oz_av]

## 2018-04-07 MED ORDER — IPRATROPIUM BROMIDE 0.02 % IN SOLN
0.5000 mg | Freq: Three times a day (TID) | RESPIRATORY_TRACT | Status: DC
  Administered 2018-04-08 – 2018-04-09 (×4): 0.5 mg via RESPIRATORY_TRACT
  Filled 2018-04-07 (×5): qty 2.5

## 2018-04-07 MED ORDER — LEVALBUTEROL HCL 0.63 MG/3ML IN NEBU
0.6300 mg | INHALATION_SOLUTION | Freq: Three times a day (TID) | RESPIRATORY_TRACT | Status: DC
  Administered 2018-04-08 – 2018-04-09 (×4): 0.63 mg via RESPIRATORY_TRACT
  Filled 2018-04-07 (×5): qty 3

## 2018-04-07 NOTE — Care Management Important Message (Signed)
Important Message  Patient Details  Name: Michele Lucas MRN: [id number] Date of Birth: October 03, 1966   Medicare Important Message Given:  Yes    Constancia Geeting Stefan Church 04/07/2018, 2:37 PM

## 2018-04-07 NOTE — Progress Notes (Signed)
  Echocardiogram 2D Echocardiogram has been performed.  Michele Lucas 04/07/2018, 10:10 AM

## 2018-04-07 NOTE — Progress Notes (Signed)
Patient called out and said she vomited. The amount of emesis was small but the pills given earlier that morning were visible in the emesis. Patient given Zofran 4 mg to help alleviate nausea. This RN followed up with PRN and she reports she feels a little better. MD notified.

## 2018-04-07 NOTE — Care Management Important Message (Signed)
Important Message  Patient Details  Name: Michele Lucas MRN: [id number] Date of Birth: [birth year]   Medicare Important Message Given:  Yes    Leone Haven, RN 04/07/2018, 9:30 AM

## 2018-04-07 NOTE — Progress Notes (Signed)
[facility]                                                                              Patient Demographics  Michele Lucas, is a 52 y.o. female, DOB - [birth year], [id number]  Admit date - 04/03/2018   Admitting Physician Pieter Partridge, MD  Outpatient Primary MD for the patient is Clare Gandy, MD  Outpatient specialists:   LOS - 4  days   Medical records reviewed.    Chief Complaint  Patient presents with  . Shortness of Breath       Brief summary   Patient is a 52 year old female with history of COPD, not on O2, nicotine abuse, PTSD/anxiety disorder, chronic pain/fibromyalgia, chronic malnutrition presented with 2-day history of shortness of breath, productive cough with yellowish-greenish sputum, denied any fevers.  Patient was found to have diffuse wheezing by EMS, O2 sats were down to 86%.  Chest x-ray showed no focal infiltrates.  Patient was admitted for further work-up.  Patient is still on this blood test for supplemental oxygen.   Assessment & Plan    Principal Problem:     COPD acute respiratory failure with hypoxia exacerbation (HCC) in the setting of continued tobacco use, metapneumovirus. -Chest x-ray negative for infiltrates, -Currently O2 sats 100% on 4 L, does not use oxygen at home, wean as tolerated.  -Respiratory virus panel positive for metapneumovirus, flu negative.  Blood cultures negative so far -Follow sputum cultures -Patient declines oral prednisone or IV solumedrol. States it causes "stomach pain" and she doesn't pain meds.   -Continue Pulmicort, Brovana, doxycycline, flutter valve.  Continue Xopenex, Atrovent nebs -Counseled strongly on nicotine cessation. -Obtain 2D echo, EKG showed LVH with repolarization changes.  BNP 16, no CHF. -04/07/18: DC Arformoterol -Continue other neb treatment -Patient continues to report SOB  Active Problems:  Lactic acidosis with dehydration -Lactic acid improved, DC IV  fluids. -No sepsis physiology.    Chronic back pain -Patient states that it took a few years to wean off narcotics, currently on Suboxone, will continue    Anxiety with underlying history of PTSD -Continue Remeron, Effexor    Thrombocytopenia (HCC) -Follow closely, continue SCDs. No anemia, H/H close to baseline      Acute hyponatremia, likely due to dehydration -Improved IV fluids.    Protein-calorie malnutrition, severe Nutrition consult     Nicotine abuse -Patient counseled on smoking cessation, based on nicotine patch.  Code Status: full  DVT Prophylaxis:  SCD's Family Communication: Discussed in detail with the patient, all imaging results, lab results explained to the patient   Disposition Plan: Patient is still on 6L/min of supplemental oxygen. Gradually titrate downwards  Time Spent in minutes 25-minutes  Procedures:  Chest x-ray  Consultants:   None  Antimicrobials:   Doxycycline 4/26   Medications  Scheduled Meds: . benzonatate  200 mg Oral TID  . budesonide (PULMICORT) nebulizer solution  0.25 mg Nebulization BID  . buprenorphine-naloxone  1 tablet Sublingual Daily  . doxycycline  100 mg Oral Q12H  . feeding supplement (ENSURE ENLIVE)  237 mL Oral TID BM  . ipratropium  0.5 mg Nebulization Q4H  .  levalbuterol  0.63 mg Nebulization Q4H  . nicotine  21 mg Transdermal Daily  . ondansetron (ZOFRAN) IV  4 mg Intravenous Once  . sodium chloride flush  3 mL Intravenous Q12H  . venlafaxine  75 mg Oral BID   Continuous Infusions:  PRN Meds:.albuterol, ondansetron **OR** ondansetron (ZOFRAN) IV, senna-docusate   Antibiotics   Anti-infectives (From admission, onward)   Start     Dose/Rate Route Frequency Ordered Stop   04/04/18 1130  doxycycline (VIBRA-TABS) tablet 100 mg     100 mg Oral Every 12 hours 04/03/18 1058     04/03/18 0930  cefTRIAXone (ROCEPHIN) 1 g in sodium chloride 0.9 % 100 mL IVPB     1 g 200 mL/hr over 30 Minutes Intravenous  Once  04/03/18 0922 04/03/18 1036   04/03/18 0930  azithromycin (ZITHROMAX) tablet 500 mg     500 mg Oral  Once 04/03/18 0922 04/03/18 0952        Subjective:  Reports SOB, wheezing and tiredness.  Objective:   Vitals:   04/07/18 0422 04/07/18 0430 04/07/18 0758 04/07/18 0817  BP:   126/73   Pulse:   100   Resp:   20   Temp:   97.9 F (36.6 C)   TempSrc:   Oral   SpO2:  94% 93% 92%  Weight: 41.1 kg (90 lb 9.7 oz)     Height:       No intake or output data in the 24 hours ending 04/07/18 0953   Wt Readings from Last 3 Encounters:  04/07/18 41.1 kg (90 lb 9.7 oz)  11/05/17 45.4 kg (100 lb)  06/24/17 40.8 kg (90 lb)     Exam  General: Alert and oriented x 3, NAD. HEENT:  Atraumatic, normocephalic, normal oropharynx. No jaundice Cardiovascular: S1-S2, Mild tachycardia. Respiratory: Decreased air entry with expiratory wheeze Gastrointestinal: Soft, nontender, nondistended, + bowel sounds Ext: no pedal edema bilaterally Neuro: no focal deficits elicited. Musculoskeletal: No digital cyanosis, clubbing   Data Reviewed:  I have personally reviewed following labs and imaging studies  Micro Results Recent Results (from the past 240 hour(s))  Blood culture (routine x 2)     Status: None (Preliminary result)   Collection Time: 04/03/18  8:55 AM  Result Value Ref Range Status   Specimen Description BLOOD LEFT WRIST  Final   Special Requests   Final    BOTTLES DRAWN AEROBIC AND ANAEROBIC Blood Culture adequate volume   Culture   Final    NO GROWTH 4 DAYS Performed at [facility], [address] [place], [place] [address]    Report Status PENDING  Incomplete  Blood culture (routine x 2)     Status: None (Preliminary result)   Collection Time: 04/03/18 10:10 AM  Result Value Ref Range Status   Specimen Description BLOOD RIGHT HAND  Final   Special Requests   Final    BOTTLES DRAWN AEROBIC AND ANAEROBIC Blood Culture adequate volume   Culture   Final    NO GROWTH  4 DAYS Performed at [facility], [address] [place], [place] [address]    Report Status PENDING  Incomplete  Respiratory Panel by PCR     Status: Abnormal   Collection Time: 04/03/18  1:17 PM  Result Value Ref Range Status   Adenovirus NOT DETECTED NOT DETECTED Final   Coronavirus 229E NOT DETECTED NOT DETECTED Final   Coronavirus HKU1 NOT DETECTED NOT DETECTED Final   Coronavirus NL63 NOT  DETECTED NOT DETECTED Final   Coronavirus OC43 NOT DETECTED NOT DETECTED Final   Metapneumovirus DETECTED (A) NOT DETECTED Final   Rhinovirus / Enterovirus NOT DETECTED NOT DETECTED Final   Influenza A NOT DETECTED NOT DETECTED Final   Influenza B NOT DETECTED NOT DETECTED Final   Parainfluenza Virus 1 NOT DETECTED NOT DETECTED Final   Parainfluenza Virus 2 NOT DETECTED NOT DETECTED Final   Parainfluenza Virus 3 NOT DETECTED NOT DETECTED Final   Parainfluenza Virus 4 NOT DETECTED NOT DETECTED Final   Respiratory Syncytial Virus NOT DETECTED NOT DETECTED Final   Bordetella pertussis NOT DETECTED NOT DETECTED Final   Chlamydophila pneumoniae NOT DETECTED NOT DETECTED Final   Mycoplasma pneumoniae NOT DETECTED NOT DETECTED Final  Culture, expectorated sputum-assessment     Status: None   Collection Time: 04/04/18  9:32 PM  Result Value Ref Range Status   Specimen Description SPUTUM  Final   Special Requests NONE  Final   Sputum evaluation   Final    THIS SPECIMEN IS ACCEPTABLE FOR SPUTUM CULTURE Performed at [facility], [address] [place], [place] [address]    Report Status 04/05/2018 FINAL  Final  Culture, respiratory (NON-Expectorated)     Status: None (Preliminary result)   Collection Time: 04/04/18  9:32 PM  Result Value Ref Range Status   Specimen Description SPUTUM  Final   Special Requests NONE Reflexed from [id number]  Final   Gram Stain   Final    MODERATE WBC PRESENT, PREDOMINANTLY MONONUCLEAR FEW SQUAMOUS EPITHELIAL CELLS PRESENT FEW GRAM POSITIVE COCCI IN  PAIRS RARE BUDDING YEAST SEEN    Culture   Final    FEW Consistent with normal respiratory flora. Performed at [facility], [address], [place], [place] [address]    Report Status PENDING  Incomplete    Radiology Reports Dg Chest 2 View  Result Date: 04/04/2018 CLINICAL DATA:  Acute bronchitis.  COPD. EXAM: CHEST - 2 VIEW COMPARISON:  04/03/2018 and 06/25/2017 FINDINGS: The heart size and mediastinal contours are within normal limits. Pulmonary hyperinflation again seen, consistent with COPD. Left basilar scarring is again noted. No evidence of pulmonary infiltrate or edema. No evidence of pleural effusion. The visualized skeletal structures are unremarkable. IMPRESSION: Stable left lower lobe scarring and COPD.  No active lung disease. Electronically Signed   By: Myles Rosenthal M.D.   On: 04/04/2018 13:23   Dg Chest 2 View  Result Date: 04/03/2018 CLINICAL DATA:  Worsening shortness of breath over the past 2 days. History COPD. EXAM: CHEST - 2 VIEW COMPARISON:  Chest x-ray dated June 25, 2017. FINDINGS: The heart size and mediastinal contours are within normal limits. Normal pulmonary vascularity. The lungs remain hyperinflated. No focal consolidation, pleural effusion, or pneumothorax. No acute osseous abnormality. IMPRESSION: COPD.  No active cardiopulmonary disease. Electronically Signed   By: Obie Dredge M.D.   On: 04/03/2018 09:17    Lab Data:  CBC: Recent Labs  Lab 04/03/18 0855 04/04/18 0340  WBC 6.4 8.2  NEUTROABS 4.7  --   HGB 12.9 12.2  HCT 39.8 38.8  MCV 94.8 95.8  PLT 96* 95*   Basic Metabolic Panel: Recent Labs  Lab 04/03/18 0855 04/04/18 0340  NA 132* 138  K 4.5 4.8  CL 96* 104  CO2 24 30  GLUCOSE 117* 127*  BUN 9 13  CREATININE 0.72 0.67  CALCIUM 9.1 8.9   GFR: Estimated Creatinine Clearance: 52.8 mL/min (by C-G formula based on SCr  of 0.67 mg/dL). Liver Function Tests: Recent Labs  Lab 04/04/18 0340  AST 23  ALT 14  ALKPHOS 40    BILITOT 0.8  PROT 6.5  ALBUMIN 3.4*   No results for input(s): LIPASE, AMYLASE in the last 168 hours. No results for input(s): AMMONIA in the last 168 hours. Coagulation Profile: No results for input(s): INR, PROTIME in the last 168 hours. Cardiac Enzymes: No results for input(s): CKTOTAL, CKMB, CKMBINDEX, TROPONINI in the last 168 hours. BNP (last 3 results) No results for input(s): PROBNP in the last 8760 hours. HbA1C: No results for input(s): HGBA1C in the last 72 hours. CBG: Recent Labs  Lab 04/04/18 0628 04/04/18 0731 04/05/18 0711 04/06/18 0813 04/07/18 0758  GLUCAP 120* 125* 68 84 82   Lipid Profile: No results for input(s): CHOL, HDL, LDLCALC, TRIG, CHOLHDL, LDLDIRECT in the last 72 hours. Thyroid Function Tests: No results for input(s): TSH, T4TOTAL, FREET4, T3FREE, THYROIDAB in the last 72 hours. Anemia Panel: No results for input(s): VITAMINB12, FOLATE, FERRITIN, TIBC, IRON, RETICCTPCT in the last 72 hours. Urine analysis:    Component Value Date/Time   COLORURINE AMBER (A) 10/07/2016 2345   APPEARANCEUR CLOUDY (A) 10/07/2016 2345   LABSPEC 1.023 10/07/2016 2345   PHURINE 6.0 10/07/2016 2345   GLUCOSEU NEGATIVE 10/07/2016 2345   HGBUR TRACE (A) 10/07/2016 2345   BILIRUBINUR NEGATIVE 10/07/2016 2345   KETONESUR NEGATIVE 10/07/2016 2345   PROTEINUR NEGATIVE 10/07/2016 2345   UROBILINOGEN 1.0 10/01/2015 1225   NITRITE NEGATIVE 10/07/2016 2345   LEUKOCYTESUR NEGATIVE 10/07/2016 2345     Barnetta Chapel M.D. [facility] 04/07/2018, 9:53 AM  Pager: [phone] Between 7am to 7pm - call Pager  After 7pm go to [email or web address] - password [email or web address]  Call night coverage person covering after 7pm

## 2018-04-08 LAB — CULTURE, BLOOD (ROUTINE X 2)
CULTURE: NO GROWTH
Culture: NO GROWTH
SPECIAL REQUESTS: ADEQUATE
Special Requests: ADEQUATE

## 2018-04-08 LAB — GLUCOSE, CAPILLARY: Glucose-Capillary: 80 mg/dL (ref 65–99)

## 2018-04-08 LAB — CULTURE, RESPIRATORY W GRAM STAIN

## 2018-04-08 LAB — CULTURE, RESPIRATORY: CULTURE: NORMAL

## 2018-04-08 NOTE — Progress Notes (Signed)
[facility]                                                                              Patient Demographics  Michele Lucas, is a 52 y.o. female, DOB - 01/20/1966, ZYS:[id number]  Admit date - 04/03/2018   Admitting Physician Pieter Partridge, MD  Outpatient Primary MD for the patient is Clare Gandy, MD  Outpatient specialists:   LOS - 5  days   Medical records reviewed.    Chief Complaint  Patient presents with  . Shortness of Breath       Brief summary   Patient is a 52 year old female with history of COPD, not on O2, nicotine abuse, PTSD/anxiety disorder, chronic pain/fibromyalgia, chronic malnutrition presented with 2-day history of shortness of breath, productive cough with yellowish-greenish sputum, denied any fevers.  Patient was found to have diffuse wheezing by EMS, O2 sats were down to 86%.  Chest x-ray showed no focal infiltrates.  Patient was admitted for further work-up.  Patient is still on this blood test for supplemental oxygen.  04/08/2018: Patient seen alongside patient's nurse.  Patient continues to improve.  Patient still on 6 L of supplemental oxygen.  The goal will be to wean down patient's supplemental oxygen.  Discussed with the patient's nurse, supplemental oxygen will be titrated to keep O2 sat greater than 91%.  Hopefully, the patient will be discharged back home in the morning.  There may be need to discharge patient home on supplemental oxygen.  Significantly, echocardiogram revealed normal ejection fraction, grade 1 diastolic dysfunction, moderate aortic stenosis and regurgitation.  Patient is known to the cardiology team as per patient.  Assessment & Plan    Principal Problem:     COPD acute respiratory failure with hypoxia exacerbation (HCC) in the setting of continued tobacco use, metapneumovirus. -Chest x-ray negative for infiltrates, -Currently O2 sats 100% on 4 L, does not use oxygen at home, wean as tolerated.    -Respiratory virus panel positive for metapneumovirus, flu negative.  Blood cultures negative so far -Follow sputum cultures -Patient declines oral prednisone or IV solumedrol. States it causes "stomach pain" and she doesn't pain meds.   -Continue Pulmicort, Brovana, doxycycline, flutter valve.  Continue Xopenex, Atrovent nebs -Counseled strongly on nicotine cessation. -Obtain 2D echo, EKG showed LVH with repolarization changes.  BNP 16, no CHF. -04/07/18: DC Arformoterol -Continue other neb treatment -Patient is improving today, 04/08/2018.  Shortness of breath is improving.  Wheezing is improving. -Wean down patient's oxygen, titrate to keep O2 sat greater than 91%.  Discussed with the patient's nurse. -Likely discharge back home in the morning.  Patient may be discharged home on supplemental oxygen.  Active Problems:  Lactic acidosis with dehydration -Lactic acid improved, DC IV fluids. -No sepsis physiology.    Chronic back pain -Patient states that it took a few years to wean off narcotics, currently on Suboxone, will continue    Anxiety with underlying history of PTSD -Continue Remeron, Effexor    Thrombocytopenia (HCC) -Follow closely, continue SCDs. No anemia, H/H close to baseline      Acute hyponatremia, likely due to dehydration -Improved IV fluids. -Hyponatremia  has resolved.    Protein-calorie malnutrition, severe Nutrition consult     Nicotine abuse -Patient counseled on smoking cessation, based on nicotine patch.  Grade 1 diastolic dysfunction: -Stable.  Valvular heart disease (moderate aortic stenosis and regurgitation): -Patient follows up with cardiology team, ? at the [place]. -Stable  Code Status: full  DVT Prophylaxis:  SCD's Family Communication: Discussed in detail with the patient, all imaging results, lab results explained to the patient   Disposition Plan: Patient is still on 6L/min of supplemental oxygen. Gradually titrate downwards  Time Spent  in minutes 25-minutes  Procedures:  Chest x-ray  Consultants:   None  Antimicrobials:   Doxycycline 4/26   Medications  Scheduled Meds: . benzonatate  200 mg Oral TID  . budesonide (PULMICORT) nebulizer solution  0.25 mg Nebulization BID  . buprenorphine-naloxone  1 tablet Sublingual Daily  . doxycycline  100 mg Oral Q12H  . feeding supplement (ENSURE ENLIVE)  237 mL Oral TID BM  . ipratropium  0.5 mg Nebulization TID  . levalbuterol  0.63 mg Nebulization TID  . nicotine  21 mg Transdermal Daily  . ondansetron (ZOFRAN) IV  4 mg Intravenous Once  . sodium chloride flush  3 mL Intravenous Q12H  . venlafaxine  75 mg Oral BID   Continuous Infusions:  PRN Meds:.albuterol, ondansetron **OR** ondansetron (ZOFRAN) IV, senna-docusate   Antibiotics   Anti-infectives (From admission, onward)   Start     Dose/Rate Route Frequency Ordered Stop   04/04/18 1130  doxycycline (VIBRA-TABS) tablet 100 mg     100 mg Oral Every 12 hours 04/03/18 1058     04/03/18 0930  cefTRIAXone (ROCEPHIN) 1 g in sodium chloride 0.9 % 100 mL IVPB     1 g 200 mL/hr over 30 Minutes Intravenous  Once 04/03/18 0922 04/03/18 1036   04/03/18 0930  azithromycin (ZITHROMAX) tablet 500 mg     500 mg Oral  Once 04/03/18 0922 04/03/18 0952        Subjective:  Reports SOB, wheezing and tiredness.  Objective:   Vitals:   04/08/18 0800 04/08/18 0855 04/08/18 1407 04/08/18 1527  BP: 98/64   102/60  Pulse: 94 96 (!) 107 94  Resp: Temp: 97.9 F (36.6 C)   97.9 F (36.6 C)  TempSrc: Oral   Oral  SpO2: 98% 100% 95% 94%  Weight:      Height:        Intake/Output Summary (Last 24 hours) at 04/08/2018 1611 Last data filed at 04/08/2018 1300 Gross per 24 hour  Intake 966 ml  Output -  Net 966 ml     Wt Readings from Last 3 Encounters:  04/08/18 41.2 kg (90 lb 13.3 oz)  11/05/17 45.4 kg (100 lb)  06/24/17 40.8 kg (90 lb)     Exam  General: Alert and oriented x 3, NAD. HEENT:   Atraumatic, normocephalic, normal oropharynx. No jaundice Cardiovascular: S1-S2, Mild tachycardia. Respiratory: Improved air entry.  Minimal expiratory wheeze.   Gastrointestinal: Soft, nontender, nondistended, + bowel sounds Ext: no pedal edema bilaterally Neuro: no focal deficits elicited. Musculoskeletal: No digital cyanosis, clubbing   Data Reviewed:  I have personally reviewed following labs and imaging studies  Micro Results Recent Results (from the past 240 hour(s))  Blood culture (routine x 2)     Status: None   Collection Time: 04/03/18  8:55 AM  Result Value Ref Range Status   Specimen Description BLOOD LEFT WRIST  Final  Special Requests   Final    BOTTLES DRAWN AEROBIC AND ANAEROBIC Blood Culture adequate volume   Culture   Final    NO GROWTH 5 DAYS Performed at [facility], [address] [place], [place] [address]    Report Status 04/08/2018 FINAL  Final  Blood culture (routine x 2)     Status: None   Collection Time: 04/03/18 10:10 AM  Result Value Ref Range Status   Specimen Description BLOOD RIGHT HAND  Final   Special Requests   Final    BOTTLES DRAWN AEROBIC AND ANAEROBIC Blood Culture adequate volume   Culture   Final    NO GROWTH 5 DAYS Performed at [facility], [address], [place], [place] [address]    Report Status 04/08/2018 FINAL  Final  Respiratory Panel by PCR     Status: Abnormal   Collection Time: 04/03/18  1:17 PM  Result Value Ref Range Status   Adenovirus NOT DETECTED NOT DETECTED Final   Coronavirus 229E NOT DETECTED NOT DETECTED Final   Coronavirus HKU1 NOT DETECTED NOT DETECTED Final   Coronavirus NL63 NOT DETECTED NOT DETECTED Final   Coronavirus OC43 NOT DETECTED NOT DETECTED Final   Metapneumovirus DETECTED (A) NOT DETECTED Final   Rhinovirus / Enterovirus NOT DETECTED NOT DETECTED Final   Influenza A NOT DETECTED NOT DETECTED Final   Influenza B NOT DETECTED NOT DETECTED Final   Parainfluenza Virus 1 NOT  DETECTED NOT DETECTED Final   Parainfluenza Virus 2 NOT DETECTED NOT DETECTED Final   Parainfluenza Virus 3 NOT DETECTED NOT DETECTED Final   Parainfluenza Virus 4 NOT DETECTED NOT DETECTED Final   Respiratory Syncytial Virus NOT DETECTED NOT DETECTED Final   Bordetella pertussis NOT DETECTED NOT DETECTED Final   Chlamydophila pneumoniae NOT DETECTED NOT DETECTED Final   Mycoplasma pneumoniae NOT DETECTED NOT DETECTED Final  Culture, expectorated sputum-assessment     Status: None   Collection Time: 04/04/18  9:32 PM  Result Value Ref Range Status   Specimen Description SPUTUM  Final   Special Requests NONE  Final   Sputum evaluation   Final    THIS SPECIMEN IS ACCEPTABLE FOR SPUTUM CULTURE Performed at [facility], [address], [place], [place] [address]    Report Status 04/05/2018 FINAL  Final  Culture, respiratory (NON-Expectorated)     Status: None   Collection Time: 04/04/18  9:32 PM  Result Value Ref Range Status   Specimen Description SPUTUM  Final   Special Requests NONE Reflexed from [id number]  Final   Gram Stain   Final    MODERATE WBC PRESENT, PREDOMINANTLY MONONUCLEAR FEW SQUAMOUS EPITHELIAL CELLS PRESENT FEW GRAM POSITIVE COCCI IN PAIRS RARE BUDDING YEAST SEEN    Culture   Final    FEW Consistent with normal respiratory flora. Performed at [facility], [address], [place], [place] [address]    Report Status 04/08/2018 FINAL  Final    Radiology Reports Dg Chest 2 View  Result Date: 04/04/2018 CLINICAL DATA:  Acute bronchitis.  COPD. EXAM: CHEST - 2 VIEW COMPARISON:  04/03/2018 and 06/25/2017 FINDINGS: The heart size and mediastinal contours are within normal limits. Pulmonary hyperinflation again seen, consistent with COPD. Left basilar scarring is again noted. No evidence of pulmonary infiltrate or edema. No evidence of pleural effusion. The visualized skeletal structures are unremarkable. IMPRESSION: Stable left lower lobe scarring and COPD.  No  active lung disease. Electronically Signed   By:  Myles Rosenthal M.D.   On: 04/04/2018 13:23   Dg Chest 2 View  Result Date: 04/03/2018 CLINICAL DATA:  Worsening shortness of breath over the past 2 days. History COPD. EXAM: CHEST - 2 VIEW COMPARISON:  Chest x-ray dated June 25, 2017. FINDINGS: The heart size and mediastinal contours are within normal limits. Normal pulmonary vascularity. The lungs remain hyperinflated. No focal consolidation, pleural effusion, or pneumothorax. No acute osseous abnormality. IMPRESSION: COPD.  No active cardiopulmonary disease. Electronically Signed   By: Obie Dredge M.D.   On: 04/03/2018 09:17    Lab Data:  CBC: Recent Labs  Lab 04/03/18 0855 04/04/18 0340  WBC 6.4 8.2  NEUTROABS 4.7  --   HGB 12.9 12.2  HCT 39.8 38.8  MCV 94.8 95.8  PLT 96* 95*   Basic Metabolic Panel: Recent Labs  Lab 04/03/18 0855 04/04/18 0340  NA 132* 138  K 4.5 4.8  CL 96* 104  CO2 24 30  GLUCOSE 117* 127*  BUN 9 13  CREATININE 0.72 0.67  CALCIUM 9.1 8.9   GFR: Estimated Creatinine Clearance: 52.9 mL/min (by C-G formula based on SCr of 0.67 mg/dL). Liver Function Tests: Recent Labs  Lab 04/04/18 0340  AST 23  ALT 14  ALKPHOS 40  BILITOT 0.8  PROT 6.5  ALBUMIN 3.4*   No results for input(s): LIPASE, AMYLASE in the last 168 hours. No results for input(s): AMMONIA in the last 168 hours. Coagulation Profile: No results for input(s): INR, PROTIME in the last 168 hours. Cardiac Enzymes: No results for input(s): CKTOTAL, CKMB, CKMBINDEX, TROPONINI in the last 168 hours. BNP (last 3 results) No results for input(s): PROBNP in the last 8760 hours. HbA1C: No results for input(s): HGBA1C in the last 72 hours. CBG: Recent Labs  Lab 04/04/18 0731 04/05/18 0711 04/06/18 0813 04/07/18 0758 04/08/18 0758  GLUCAP 125* 68 84 82 80   Lipid Profile: No results for input(s): CHOL, HDL, LDLCALC, TRIG, CHOLHDL, LDLDIRECT in the last 72 hours. Thyroid Function  Tests: No results for input(s): TSH, T4TOTAL, FREET4, T3FREE, THYROIDAB in the last 72 hours. Anemia Panel: No results for input(s): VITAMINB12, FOLATE, FERRITIN, TIBC, IRON, RETICCTPCT in the last 72 hours. Urine analysis:    Component Value Date/Time   COLORURINE AMBER (A) 10/07/2016 2345   APPEARANCEUR CLOUDY (A) 10/07/2016 2345   LABSPEC 1.023 10/07/2016 2345   PHURINE 6.0 10/07/2016 2345   GLUCOSEU NEGATIVE 10/07/2016 2345   HGBUR TRACE (A) 10/07/2016 2345   BILIRUBINUR NEGATIVE 10/07/2016 2345   KETONESUR NEGATIVE 10/07/2016 2345   PROTEINUR NEGATIVE 10/07/2016 2345   UROBILINOGEN 1.0 10/01/2015 1225   NITRITE NEGATIVE 10/07/2016 2345   LEUKOCYTESUR NEGATIVE 10/07/2016 2345     Barnetta Chapel M.D. [facility] 04/08/2018, 4:11 PM  Pager: [phone] Between 7am to 7pm - call Pager  After 7pm go to [email or web address] - password [email or web address]  Call night coverage person covering after 7pm

## 2018-04-08 NOTE — Progress Notes (Signed)
Nutrition Follow Up  DOCUMENTATION CODES:   Underweight, Severe malnutrition in context of chronic illness  INTERVENTION:    Ensure Enlive po BID, each supplement provides 350 kcal and 20 grams of protein  Magic cup TID with meals, each supplement provides 290 kcal and 9 grams of protein  NUTRITION DIAGNOSIS:   Severe Malnutrition related to chronic illness(COPD) as evidenced by energy intake < or equal to 75% for > or equal to 1 month, severe fat depletion, severe muscle depletion, ongoing  GOAL:   Patient will meet greater than or equal to 90% of their needs, progressing  MONITOR:   PO intake, Supplement acceptance, Weight trends, Labs  ASSESSMENT:   Patient with PMH significant for COPD, tobacco abuse, PTSD, fibromyalgia/chronic pain/neuropathic pain, and chronic malnutrition. Presents this admission with acute bronchitis with COPD.    Pt states her appetite is "okay". She is having trouble swallowing "this particular pill". PO intake rather poor at 30-40% per flowsheet records.  She is loving Ensure Enlive supplements. Drinking 2-3 per day. Labs and medications reviewed. CBG's [id number].  Diet Order:   Diet Order           DIET SOFT Room service appropriate? Yes; Fluid consistency: Thin  Diet effective now         EDUCATION NEEDS:   Education needs have been addressed  Skin:  Skin Assessment: Reviewed RN Assessment  Last BM:  4/26  Height:   Ht Readings from Last 1 Encounters:  04/03/18  (1.575 m)   Weight:   Wt Readings from Last 1 Encounters:  04/08/18 90 lb 13.3 oz (41.2 kg)   Ideal Body Weight:  50 kg  BMI:  Body mass index is 16.61 kg/m.  Estimated Nutritional Needs:   Kcal:  1450-1650 kcal  Protein:  70-80 g  Fluid:  >1.4 L/day  Maureen Chatters, RD, LDN Pager #: [phone] After-Hours Pager #: [phone]

## 2018-04-09 ENCOUNTER — Inpatient Hospital Stay (HOSPITAL_COMMUNITY): Payer: Medicare Other

## 2018-04-09 LAB — BLOOD GAS, ARTERIAL
Acid-Base Excess: 13.6 mmol/L — ABNORMAL HIGH (ref 0.0–2.0)
Bicarbonate: 38.2 mmol/L — ABNORMAL HIGH (ref 20.0–28.0)
Drawn by: 51185
O2 Content: 4 L/min
O2 Saturation: 95.9 %
Patient temperature: 98.6
pCO2 arterial: 53.1 mmHg — ABNORMAL HIGH (ref 32.0–48.0)
pH, Arterial: 7.471 — ABNORMAL HIGH (ref 7.350–7.450)
pO2, Arterial: 77.4 mmHg — ABNORMAL LOW (ref 83.0–108.0)

## 2018-04-09 LAB — BASIC METABOLIC PANEL
Anion gap: 6 (ref 5–15)
BUN: 20 mg/dL (ref 6–20)
CO2: 39 mmol/L — ABNORMAL HIGH (ref 22–32)
Calcium: 9.7 mg/dL (ref 8.9–10.3)
Chloride: 98 mmol/L — ABNORMAL LOW (ref 101–111)
Creatinine, Ser: 0.62 mg/dL (ref 0.44–1.00)
GFR calc Af Amer: >60 mL/min (ref >60)
GFR calc non Af Amer: >60 mL/min (ref >60)
Glucose, Bld: 113 mg/dL — ABNORMAL HIGH (ref 65–99)
Potassium: 4.3 mmol/L (ref 3.5–5.1)
Sodium: 143 mmol/L (ref 135–145)

## 2018-04-09 LAB — CBC WITH DIFFERENTIAL/PLATELET
Basophils Absolute: 0 10*3/uL (ref 0.0–0.1)
Basophils Relative: 0 %
Eosinophils Absolute: 0.2 10*3/uL (ref 0.0–0.7)
Eosinophils Relative: 3 %
HCT: 40.9 % (ref 36.0–46.0)
Hemoglobin: 12.4 g/dL (ref 12.0–15.0)
Lymphocytes Relative: 36 %
Lymphs Abs: 2.2 10*3/uL (ref 0.7–4.0)
MCH: 30 pg (ref 26.0–34.0)
MCHC: 30.3 g/dL (ref 30.0–36.0)
MCV: 98.8 fL (ref 78.0–100.0)
Monocytes Absolute: 0.5 10*3/uL (ref 0.1–1.0)
Monocytes Relative: 8 %
Neutro Abs: 3.2 10*3/uL (ref 1.7–7.7)
Neutrophils Relative %: 53 %
Platelets: 172 10*3/uL (ref 150–400)
RBC: 4.14 MIL/uL (ref 3.87–5.11)
RDW: 13.1 % (ref 11.5–15.5)
WBC: 6.1 10*3/uL (ref 4.0–10.5)

## 2018-04-09 LAB — D-DIMER, QUANTITATIVE (NOT AT ARMC): D-Dimer, Quant: 0.34 ug/mL-FEU (ref 0.00–0.50)

## 2018-04-09 LAB — BRAIN NATRIURETIC PEPTIDE: B Natriuretic Peptide: 10 pg/mL (ref 0.0–100.0)

## 2018-04-09 LAB — MAGNESIUM: Magnesium: 2 mg/dL (ref 1.7–2.4)

## 2018-04-09 LAB — GLUCOSE, CAPILLARY: GLUCOSE-CAPILLARY: 80 mg/dL (ref 65–99)

## 2018-04-09 MED ORDER — BUDESONIDE 0.5 MG/2ML IN SUSP: 0.5000 mg | Freq: Two times a day (BID) | RESPIRATORY_TRACT | Status: DC

## 2018-04-09 MED ORDER — BUDESONIDE 0.5 MG/2ML IN SUSP
0.5000 mg | Freq: Two times a day (BID) | RESPIRATORY_TRACT | Status: DC
  Administered 2018-04-09 – 2018-04-12 (×7): 0.5 mg via RESPIRATORY_TRACT
  Filled 2018-04-09 (×7): qty 2

## 2018-04-09 MED ORDER — IPRATROPIUM-ALBUTEROL 0.5-2.5 (3) MG/3ML IN SOLN
3.0000 mL | Freq: Four times a day (QID) | RESPIRATORY_TRACT | Status: DC
  Administered 2018-04-09 (×2): 3 mL via RESPIRATORY_TRACT
  Filled 2018-04-09 (×2): qty 3

## 2018-04-09 MED ORDER — METHYLPREDNISOLONE SODIUM SUCC 40 MG IJ SOLR
40.0000 mg | Freq: Three times a day (TID) | INTRAMUSCULAR | Status: DC
  Administered 2018-04-09 – 2018-04-11 (×7): 40 mg via INTRAVENOUS
  Filled 2018-04-09 (×7): qty 1

## 2018-04-09 MED ORDER — SUCRALFATE 1 GM/10ML PO SUSP
1.0000 g | Freq: Three times a day (TID) | ORAL | Status: DC
  Administered 2018-04-09 – 2018-04-13 (×16): 1 g via ORAL
  Filled 2018-04-09 (×16): qty 10

## 2018-04-09 MED ORDER — PANTOPRAZOLE SODIUM 40 MG PO TBEC
40.0000 mg | DELAYED_RELEASE_TABLET | Freq: Two times a day (BID) | ORAL | Status: DC
  Administered 2018-04-09 – 2018-04-13 (×9): 40 mg via ORAL
  Filled 2018-04-09 (×9): qty 1

## 2018-04-09 MED ORDER — IPRATROPIUM-ALBUTEROL 0.5-2.5 (3) MG/3ML IN SOLN
3.0000 mL | Freq: Four times a day (QID) | RESPIRATORY_TRACT | Status: DC | PRN
  Filled 2018-04-09: qty 3

## 2018-04-09 MED ORDER — SODIUM CHLORIDE 0.9 % IV SOLN
INTRAVENOUS | Status: DC
  Administered 2018-04-09: 1000 mL via INTRAVENOUS
  Administered 2018-04-10 – 2018-04-11 (×2): via INTRAVENOUS

## 2018-04-09 NOTE — Progress Notes (Signed)
[facility]                                                                              Patient Demographics  Michele Lucas, is a 52 y.o. female, DOB - 1966/10/29, ZOX:[id number]  Admit date - 04/03/2018   Admitting Physician Pieter Partridge, MD  Outpatient Primary MD for the patient is Clare Gandy, MD  Outpatient specialists:   LOS - 6  days   Medical records reviewed.    Chief Complaint  Patient presents with  . Shortness of Breath       Brief summary   Patient is a 52 year old female with history of COPD, not on O2, nicotine abuse, PTSD/anxiety disorder, chronic pain/fibromyalgia, chronic malnutrition presented with 2-day history of shortness of breath, productive cough with yellowish-greenish sputum, denied any fevers.  Patient was found to have diffuse wheezing by EMS, O2 sats were down to 86%.  Chest x-ray showed no focal infiltrates.  Patient was admitted for further work-up.  Patient is still on this blood test for supplemental oxygen.  04/08/2018: Patient seen alongside patient's nurse.  Patient continues to improve.  Patient still on 6 L of supplemental oxygen.  The goal will be to wean down patient's supplemental oxygen.  Discussed with the patient's nurse, supplemental oxygen will be titrated to keep O2 sat greater than 91%.  Hopefully, the patient will be discharged back home in the morning.  There may be need to discharge patient home on supplemental oxygen.  Significantly, echocardiogram revealed normal ejection fraction, grade 1 diastolic dysfunction, moderate aortic stenosis and regurgitation.  Patient is known to the cardiology team as per patient.  04/09/2018: Patient still requiring 4 L/min of supplemental oxygen via nasal cannula.  Cardiac BNP is 10.  D-dimer is 0.34.  Chest x-ray revealed COPD changes.  ABG is noted.  After extensive discussion with the patient, the patient has eventually agreed to steroid treatment.  Will start patient  on IV Solu-Medrol.  Will gently hydrate the patient.  Echo is noted.  Will also use the PPI as well as sucralfate as patient is particularly worried about gastric irritation following steroid use.  Further management will depend on hospital course.  Patient may eventually need supplemental oxygen on discharge, perhaps, for a short period.  Assessment & Plan    Principal Problem:     COPD acute respiratory failure with hypoxia exacerbation (HCC) in the setting of continued tobacco use, metapneumovirus. -Chest x-ray negative for infiltrates, -Currently O2 sats 100% on 4 L, does not use oxygen at home, wean as tolerated.  -Respiratory virus panel positive for metapneumovirus, flu negative.  Blood cultures negative so far -Follow sputum cultures -Patient declines oral prednisone or IV solumedrol. States it causes "stomach pain" and she doesn't pain meds.   -Continue Pulmicort, Brovana, doxycycline, flutter valve.  Continue Xopenex, Atrovent nebs -Counseled strongly on nicotine cessation. -Obtain 2D echo, EKG showed LVH with repolarization changes.  BNP 16, no CHF. -04/07/18: DC Arformoterol -Continue other neb treatment -Patient is improving today, 04/08/2018.  Shortness of breath is improving.  Wheezing is improving. -Wean down patient's oxygen, titrate to keep O2 sat greater  than 91%.  Discussed with the patient's nurse. -Likely discharge back home in the morning.  Patient may be discharged home on supplemental oxygen. -04/09/2018: Start patient on IV Solu-Medrol.  Cautious hydration.  PPI and sucralfate for anticipated dyspeptic syndrome following steroid use.  Patient will likely be discharged back home on supplemental oxygen. -ABG result is noted. -Cardiac BMP was tender later today. -D-dimer was 0.34. -Chest x-ray revealed COPD changes.  Active Problems:  Lactic acidosis with dehydration -Lactic acid improved, DC IV fluids. -No sepsis physiology. -04/09/2018: Restart IV fluids  cautiously.    Chronic back pain -Patient states that it took a few years to wean off narcotics, currently on Suboxone, will continue    Anxiety with underlying history of PTSD -Continue Remeron, Effexor    Thrombocytopenia (HCC) -Follow closely, continue SCDs. No anemia, H/H close to baseline      Acute hyponatremia, likely due to dehydration -Improved IV fluids. -Hyponatremia has resolved.    Protein-calorie malnutrition, severe Nutrition consult     Nicotine abuse -Patient counseled on smoking cessation, based on nicotine patch.  Grade 1 diastolic dysfunction: -Stable.  Valvular heart disease (moderate aortic stenosis and regurgitation): -Patient follows up with cardiology team, ? at the [place]. -Stable  Code Status: full  DVT Prophylaxis:  SCD's Family Communication: Discussed in detail with the patient, all imaging results, lab results explained to the patient   Disposition Plan: Patient is still on 6L/min of supplemental oxygen. Gradually titrate downwards  Time Spent in minutes 25-minutes  Procedures:  Chest x-ray  Consultants:   None  Antimicrobials:   Doxycycline 4/26   Medications  Scheduled Meds: . benzonatate  200 mg Oral TID  . budesonide (PULMICORT) nebulizer solution  0.5 mg Nebulization BID  . buprenorphine-naloxone  1 tablet Sublingual Daily  . doxycycline  100 mg Oral Q12H  . feeding supplement (ENSURE ENLIVE)  237 mL Oral TID BM  . ipratropium-albuterol  3 mL Nebulization Q6H  . methylPREDNISolone (SOLU-MEDROL) injection  40 mg Intravenous Q8H  . nicotine  21 mg Transdermal Daily  . ondansetron (ZOFRAN) IV  4 mg Intravenous Once  . pantoprazole  40 mg Oral BID  . sodium chloride flush  3 mL Intravenous Q12H  . sucralfate  1 g Oral TID WC & HS  . venlafaxine  75 mg Oral BID   Continuous Infusions: . sodium chloride     PRN Meds:.ipratropium-albuterol, ondansetron **OR** ondansetron (ZOFRAN) IV, senna-docusate   Antibiotics    Anti-infectives (From admission, onward)   Start     Dose/Rate Route Frequency Ordered Stop   04/04/18 1130  doxycycline (VIBRA-TABS) tablet 100 mg     100 mg Oral Every 12 hours 04/03/18 1058     04/03/18 0930  cefTRIAXone (ROCEPHIN) 1 g in sodium chloride 0.9 % 100 mL IVPB     1 g 200 mL/hr over 30 Minutes Intravenous  Once 04/03/18 0922 04/03/18 1036   04/03/18 0930  azithromycin (ZITHROMAX) tablet 500 mg     500 mg Oral  Once 04/03/18 0922 04/03/18 0952        Subjective:  Reports SOB, wheezing and tiredness.  Objective:   Vitals:   04/09/18 0759 04/09/18 1401 04/09/18 1403 04/09/18 1602  BP: 115/66   (!) 100/56  Pulse: (!) 102 (!) 103  92  Resp: Temp: 97.8 F (36.6 C)   98.5 F (36.9 C)  TempSrc:    Oral  SpO2:  96% 96% 96%  Weight:  Height:        Intake/Output Summary (Last 24 hours) at 04/09/2018 1743 Last data filed at 04/09/2018 1300 Gross per 24 hour  Intake 600 ml  Output 1100 ml  Net -500 ml     Wt Readings from Last 3 Encounters:  04/09/18 45 kg (99 lb 3.3 oz)  11/05/17 45.4 kg (100 lb)  06/24/17 40.8 kg (90 lb)     Exam  General: Alert and oriented x 3, NAD. HEENT:  Atraumatic, normocephalic, normal oropharynx. No jaundice Cardiovascular: S1-S2, Mild tachycardia. Respiratory: Decreased air entry.  Expiratory wheeze.     Gastrointestinal: Soft, nontender, nondistended, + bowel sounds Ext: no pedal edema bilaterally Neuro: no focal deficits elicited. Musculoskeletal: No digital cyanosis, clubbing   Data Reviewed:  I have personally reviewed following labs and imaging studies  Micro Results Recent Results (from the past 240 hour(s))  Blood culture (routine x 2)     Status: None   Collection Time: 04/03/18  8:55 AM  Result Value Ref Range Status   Specimen Description BLOOD LEFT WRIST  Final   Special Requests   Final    BOTTLES DRAWN AEROBIC AND ANAEROBIC Blood Culture adequate volume   Culture   Final    NO GROWTH 5  DAYS Performed at [facility], [address], [place], [place] [address]    Report Status 04/08/2018 FINAL  Final  Blood culture (routine x 2)     Status: None   Collection Time: 04/03/18 10:10 AM  Result Value Ref Range Status   Specimen Description BLOOD RIGHT HAND  Final   Special Requests   Final    BOTTLES DRAWN AEROBIC AND ANAEROBIC Blood Culture adequate volume   Culture   Final    NO GROWTH 5 DAYS Performed at [facility], [address] [place], [place] [address]    Report Status 04/08/2018 FINAL  Final  Respiratory Panel by PCR     Status: Abnormal   Collection Time: 04/03/18  1:17 PM  Result Value Ref Range Status   Adenovirus NOT DETECTED NOT DETECTED Final   Coronavirus 229E NOT DETECTED NOT DETECTED Final   Coronavirus HKU1 NOT DETECTED NOT DETECTED Final   Coronavirus NL63 NOT DETECTED NOT DETECTED Final   Coronavirus OC43 NOT DETECTED NOT DETECTED Final   Metapneumovirus DETECTED (A) NOT DETECTED Final   Rhinovirus / Enterovirus NOT DETECTED NOT DETECTED Final   Influenza A NOT DETECTED NOT DETECTED Final   Influenza B NOT DETECTED NOT DETECTED Final   Parainfluenza Virus 1 NOT DETECTED NOT DETECTED Final   Parainfluenza Virus 2 NOT DETECTED NOT DETECTED Final   Parainfluenza Virus 3 NOT DETECTED NOT DETECTED Final   Parainfluenza Virus 4 NOT DETECTED NOT DETECTED Final   Respiratory Syncytial Virus NOT DETECTED NOT DETECTED Final   Bordetella pertussis NOT DETECTED NOT DETECTED Final   Chlamydophila pneumoniae NOT DETECTED NOT DETECTED Final   Mycoplasma pneumoniae NOT DETECTED NOT DETECTED Final  Culture, expectorated sputum-assessment     Status: None   Collection Time: 04/04/18  9:32 PM  Result Value Ref Range Status   Specimen Description SPUTUM  Final   Special Requests NONE  Final   Sputum evaluation   Final    THIS SPECIMEN IS ACCEPTABLE FOR SPUTUM CULTURE Performed at [facility], [address] [place], [place] [address]     Report Status 04/05/2018 FINAL  Final  Culture, respiratory (NON-Expectorated)     Status: None  Collection Time: 04/04/18  9:32 PM  Result Value Ref Range Status   Specimen Description SPUTUM  Final   Special Requests NONE Reflexed from [id number]  Final   Gram Stain   Final    MODERATE WBC PRESENT, PREDOMINANTLY MONONUCLEAR FEW SQUAMOUS EPITHELIAL CELLS PRESENT FEW GRAM POSITIVE COCCI IN PAIRS RARE BUDDING YEAST SEEN    Culture   Final    FEW Consistent with normal respiratory flora. Performed at [facility], [address], [place], [place] [address]    Report Status 04/08/2018 FINAL  Final    Radiology Reports Dg Chest 1 View  Result Date: 04/09/2018 CLINICAL DATA:  Shortness of breath EXAM: CHEST  1 VIEW COMPARISON:  April 04, 2018 FINDINGS: Lungs are hyperexpanded with bibasilar atelectatic change. There is no frank edema or consolidation. Heart is mildly enlarged with pulmonary vascularity within normal limits. No adenopathy. There is lower thoracic dextroscoliosis. IMPRESSION: Lungs hyperexpanded with bibasilar atelectasis. No frank edema or consolidation. Stable cardiac prominence. Electronically Signed   By: Bretta Bang III M.D.   On: 04/09/2018 13:01   Dg Chest 2 View  Result Date: 04/04/2018 CLINICAL DATA:  Acute bronchitis.  COPD. EXAM: CHEST - 2 VIEW COMPARISON:  04/03/2018 and 06/25/2017 FINDINGS: The heart size and mediastinal contours are within normal limits. Pulmonary hyperinflation again seen, consistent with COPD. Left basilar scarring is again noted. No evidence of pulmonary infiltrate or edema. No evidence of pleural effusion. The visualized skeletal structures are unremarkable. IMPRESSION: Stable left lower lobe scarring and COPD.  No active lung disease. Electronically Signed   By: Myles Rosenthal M.D.   On: 04/04/2018 13:23   Dg Chest 2 View  Result Date: 04/03/2018 CLINICAL DATA:  Worsening shortness of breath over the past 2 days. History COPD. EXAM:  CHEST - 2 VIEW COMPARISON:  Chest x-ray dated June 25, 2017. FINDINGS: The heart size and mediastinal contours are within normal limits. Normal pulmonary vascularity. The lungs remain hyperinflated. No focal consolidation, pleural effusion, or pneumothorax. No acute osseous abnormality. IMPRESSION: COPD.  No active cardiopulmonary disease. Electronically Signed   By: Obie Dredge M.D.   On: 04/03/2018 09:17    Lab Data:  CBC: Recent Labs  Lab 04/03/18 0855 04/04/18 0340 04/09/18 1306  WBC 6.4 8.2 6.1  NEUTROABS 4.7  --  3.2  HGB 12.9 12.2 12.4  HCT 39.8 38.8 40.9  MCV 94.8 95.8 98.8  PLT 96* 95* 172   Basic Metabolic Panel: Recent Labs  Lab 04/03/18 0855 04/04/18 0340 04/09/18 1306  NA 132* 138 143  K 4.5 4.8 4.3  CL 96* 104 98*  CO2 24 30 39*  GLUCOSE 117* 127* 113*  BUN 9 13 20   CREATININE 0.72 0.67 0.62  CALCIUM 9.1 8.9 9.7  MG  --   --  2.0   GFR: Estimated Creatinine Clearance: 57.8 mL/min (by C-G formula based on SCr of 0.62 mg/dL). Liver Function Tests: Recent Labs  Lab 04/04/18 0340  AST 23  ALT 14  ALKPHOS 40  BILITOT 0.8  PROT 6.5  ALBUMIN 3.4*   No results for input(s): LIPASE, AMYLASE in the last 168 hours. No results for input(s): AMMONIA in the last 168 hours. Coagulation Profile: No results for input(s): INR, PROTIME in the last 168 hours. Cardiac Enzymes: No results for input(s): CKTOTAL, CKMB, CKMBINDEX, TROPONINI in the last 168 hours. BNP (last 3 results) No results for input(s): PROBNP in the last 8760 hours. HbA1C: No results for input(s): HGBA1C in  the last 72 hours. CBG: Recent Labs  Lab 04/05/18 0711 04/06/18 0813 04/07/18 0758 04/08/18 0758 04/09/18 0748  GLUCAP 68 84 82 80 80   Lipid Profile: No results for input(s): CHOL, HDL, LDLCALC, TRIG, CHOLHDL, LDLDIRECT in the last 72 hours. Thyroid Function Tests: No results for input(s): TSH, T4TOTAL, FREET4, T3FREE, THYROIDAB in the last 72 hours. Anemia Panel: No results  for input(s): VITAMINB12, FOLATE, FERRITIN, TIBC, IRON, RETICCTPCT in the last 72 hours. Urine analysis:    Component Value Date/Time   COLORURINE AMBER (A) 10/07/2016 2345   APPEARANCEUR CLOUDY (A) 10/07/2016 2345   LABSPEC 1.023 10/07/2016 2345   PHURINE 6.0 10/07/2016 2345   GLUCOSEU NEGATIVE 10/07/2016 2345   HGBUR TRACE (A) 10/07/2016 2345   BILIRUBINUR NEGATIVE 10/07/2016 2345   KETONESUR NEGATIVE 10/07/2016 2345   PROTEINUR NEGATIVE 10/07/2016 2345   UROBILINOGEN 1.0 10/01/2015 1225   NITRITE NEGATIVE 10/07/2016 2345   LEUKOCYTESUR NEGATIVE 10/07/2016 2345   Time spent: 35 minutes.  Barnetta Chapel M.D. [facility] 04/09/2018, 5:43 PM  Pager: [phone] Between 7am to 7pm - call Pager  After 7pm go to [email or web address] - password [email or web address]  Call night coverage person covering after 7pm

## 2018-04-09 NOTE — Progress Notes (Signed)
CRITICAL VALUE ALERT  Critical Value:  PH 7.471 PCO2 53.1 PO2 77.4 Bicarb 38.2  Date & Time Notied:  04/09/18  1233  Provider Notified: Dartha Lodge  Orders Received/Actions taken: Pending

## 2018-04-10 LAB — BASIC METABOLIC PANEL
Anion gap: 6 (ref 5–15)
BUN: 18 mg/dL (ref 6–20)
CALCIUM: 9.6 mg/dL (ref 8.9–10.3)
CO2: 33 mmol/L — ABNORMAL HIGH (ref 22–32)
Chloride: 105 mmol/L (ref 101–111)
Creatinine, Ser: 0.62 mg/dL (ref 0.44–1.00)
Glucose, Bld: 134 mg/dL — ABNORMAL HIGH (ref 65–99)
Potassium: 4.6 mmol/L (ref 3.5–5.1)
SODIUM: 144 mmol/L (ref 135–145)

## 2018-04-10 LAB — GLUCOSE, CAPILLARY: Glucose-Capillary: 149 mg/dL — ABNORMAL HIGH (ref 65–99)

## 2018-04-10 MED ORDER — IPRATROPIUM-ALBUTEROL 0.5-2.5 (3) MG/3ML IN SOLN
3.0000 mL | Freq: Three times a day (TID) | RESPIRATORY_TRACT | Status: DC
  Administered 2018-04-10 – 2018-04-12 (×8): 3 mL via RESPIRATORY_TRACT
  Filled 2018-04-10 (×8): qty 3

## 2018-04-10 NOTE — Plan of Care (Signed)
  Problem: Health Behavior/Discharge Planning: Goal: Ability to manage health-related needs will improve Outcome: Not Progressing   Problem: Respiratory: Goal: Levels of oxygenation will improve Outcome: Not Progressing  RN went to assess pt as her O2 monitor was alarming. Pts O2 saturations were 80%, as pt had her nasal cannula sitting on top of her nose rather than in her nose. Pt was reminded to leave the nasal cannula in her nose to help maintain her oxygenation status.

## 2018-04-10 NOTE — Progress Notes (Signed)
[facility]                                                                              Patient Demographics  Michele Lucas, is a 52 y.o. female, DOB - 06-Sep-1966, ZOX:[id number]  Admit date - 04/03/2018   Admitting Physician Pieter Partridge, MD  Outpatient Primary MD for the patient is Clare Gandy, MD  Outpatient specialists:   LOS - 7  days   Medical records reviewed and are as summarized below:    Chief Complaint  Patient presents with  . Shortness of Breath       Brief summary   Patient is a 52 year old female with history of COPD, not on O2, nicotine abuse, PTSD/anxiety disorder, chronic pain/fibromyalgia, chronic malnutrition presented with 2-day history of shortness of breath, productive cough with yellowish-greenish sputum, denied any fevers.  Patient was found to have diffuse wheezing by EMS, O2 sats were down to 86%.  Chest x-ray showed no focal infiltrates.  Patient was admitted for further work-up.   Assessment & Plan    Principal Problem:   COPD acute respiratory failure with hypoxia exacerbation (HCC) in the setting of continued tobacco use, metapneumovirus. -Chest x-ray negative for infiltrates, -Currently O2 sats 100% on 4 L, does not use oxygen at home, wean as tolerated.  -Respiratory virus panel positive for metapneumovirus, flu negative.  Blood cultures negative so far -Follow sputum cultures -Patient declines oral prednisone or IV solumedrol. States it causes "stomach pain" and she doesn't pain meds.   -Continue Pulmicort, Brovana, doxycycline, flutter valve.  Continue Xopenex, Atrovent nebs -Counseled strongly on nicotine cessation. -Obtain 2D echo, EKG showed LVH with repolarization changes.  BNP 16, no CHF.  Active Problems:  Lactic acidosis with dehydration -Lactic acid improved, DC IV fluids. -No sepsis physiology.    Chronic back pain -Patient states that it took a few years to wean off narcotics, currently on  Suboxone, will continue    Anxiety with underlying history of PTSD -Continue Remeron, Effexor    Thrombocytopenia (HCC) -Follow closely, continue SCDs. No anemia, H/H close to baseline      Acute hyponatremia, likely due to dehydration -Improved IV fluids.    Protein-calorie malnutrition, severe Nutrition consult     Nicotine abuse -Patient counseled on smoking cessation, based on nicotine patch.  Code Status: full  DVT Prophylaxis:  SCD's Family Communication: Discussed in detail with the patient, her 2D ECHO results,  explained to the patient her diagnosis and plan of care .  Disposition Plan: Hopefully next 36 hours  Time Spent in minutes 25-minutes  Procedures:  Chest x-ray 2 ED Echo Consultants:   None  Antimicrobials:   Doxycycline 4/26   Medications  Scheduled Meds: . benzonatate  200 mg Oral TID  . budesonide (PULMICORT) nebulizer solution  0.5 mg Nebulization BID  . buprenorphine-naloxone  1 tablet Sublingual Daily  . doxycycline  100 mg Oral Q12H  . feeding supplement (ENSURE ENLIVE)  237 mL Oral TID BM  . ipratropium-albuterol  3 mL Nebulization TID  . methylPREDNISolone (SOLU-MEDROL) injection  40 mg Intravenous Q8H  . nicotine  21 mg Transdermal Daily  .  ondansetron (ZOFRAN) IV  4 mg Intravenous Once  . pantoprazole  40 mg Oral BID  . sodium chloride flush  3 mL Intravenous Q12H  . sucralfate  1 g Oral TID WC & HS  . venlafaxine  75 mg Oral BID   Continuous Infusions: . sodium chloride 50 mL/hr at 04/10/18 1038   PRN Meds:.ipratropium-albuterol, ondansetron **OR** ondansetron (ZOFRAN) IV, senna-docusate   Antibiotics   Anti-infectives (From admission, onward)   Start     Dose/Rate Route Frequency Ordered Stop   04/04/18 1130  doxycycline (VIBRA-TABS) tablet 100 mg     100 mg Oral Every 12 hours 04/03/18 1058     04/03/18 0930  cefTRIAXone (ROCEPHIN) 1 g in sodium chloride 0.9 % 100 mL IVPB     1 g 200 mL/hr over 30 Minutes Intravenous   Once 04/03/18 0922 04/03/18 1036   04/03/18 0930  azithromycin (ZITHROMAX) tablet 500 mg     500 mg Oral  Once 04/03/18 1610 04/03/18 9604        Subjective:   Michele Lucas was seen and examined today.  States she is still feeling better, has rhonchi, coughing.  No fevers or chills.  No  Nausea with vomiting    Patient denies dizziness, N/V/D/C, new weakness, numbess, tingling.  She wants to know what is going on with her heart. She is still SOB with cough and  requiring oxygen at 3 litters , we are attempting to wean her down ,   Objective:   Vitals:   04/10/18 0830 04/10/18 0952 04/10/18 1522 04/10/18 1547  BP: 118/67   102/68  Pulse: 86   (!) 104  Resp: (!) 24   (!) 21  Temp: (!) 97.3 F (36.3 C)   97.8 F (36.6 C)  TempSrc: Oral   Oral  SpO2: 97% 94% 95% 98%  Weight:      Height:        Intake/Output Summary (Last 24 hours) at 04/10/2018 1715 Last data filed at 04/10/2018 1028 Gross per 24 hour  Intake 592.17 ml  Output 1150 ml  Net -557.83 ml     Wt Readings from Last 3 Encounters:  04/10/18 42.4 kg (93 lb 6.4 oz)  11/05/17 45.4 kg (100 lb)  06/24/17 40.8 kg (90 lb)     Exam  General: Alert and oriented x 3, NAD, anxious  Eyes:  HEENT:  Atraumatic, normocephalic, normal oropharynx Cardiovascular: S1-S2 clear, RRR, no pedal edema Respiratory:mild expiratory wheez Gastrointestinal: Soft, nontender, nondistended, + bowel sounds Ext: no pedal edema bilaterally Neuro: no new deficits Musculoskeletal: No digital cyanosis, clubbing Skin: No rashes Psych: anxious, alert and oriented x3       Data Reviewed:  I have personally reviewed following labs and imaging studies  Micro Results Recent Results (from the past 240 hour(s))  Blood culture (routine x 2)     Status: None   Collection Time: 04/03/18  8:55 AM  Result Value Ref Range Status   Specimen Description BLOOD LEFT WRIST  Final   Special Requests   Final    BOTTLES DRAWN AEROBIC AND ANAEROBIC  Blood Culture adequate volume   Culture   Final    NO GROWTH 5 DAYS Performed at [facility], [address], [place], [place] [address]    Report Status 04/08/2018 FINAL  Final  Blood culture (routine x 2)     Status: None   Collection Time: 04/03/18 10:10 AM  Result Value Ref Range Status   Specimen  Description BLOOD RIGHT HAND  Final   Special Requests   Final    BOTTLES DRAWN AEROBIC AND ANAEROBIC Blood Culture adequate volume   Culture   Final    NO GROWTH 5 DAYS Performed at [facility], [address], [place], [place] [address]    Report Status 04/08/2018 FINAL  Final  Respiratory Panel by PCR     Status: Abnormal   Collection Time: 04/03/18  1:17 PM  Result Value Ref Range Status   Adenovirus NOT DETECTED NOT DETECTED Final   Coronavirus 229E NOT DETECTED NOT DETECTED Final   Coronavirus HKU1 NOT DETECTED NOT DETECTED Final   Coronavirus NL63 NOT DETECTED NOT DETECTED Final   Coronavirus OC43 NOT DETECTED NOT DETECTED Final   Metapneumovirus DETECTED (A) NOT DETECTED Final   Rhinovirus / Enterovirus NOT DETECTED NOT DETECTED Final   Influenza A NOT DETECTED NOT DETECTED Final   Influenza B NOT DETECTED NOT DETECTED Final   Parainfluenza Virus 1 NOT DETECTED NOT DETECTED Final   Parainfluenza Virus 2 NOT DETECTED NOT DETECTED Final   Parainfluenza Virus 3 NOT DETECTED NOT DETECTED Final   Parainfluenza Virus 4 NOT DETECTED NOT DETECTED Final   Respiratory Syncytial Virus NOT DETECTED NOT DETECTED Final   Bordetella pertussis NOT DETECTED NOT DETECTED Final   Chlamydophila pneumoniae NOT DETECTED NOT DETECTED Final   Mycoplasma pneumoniae NOT DETECTED NOT DETECTED Final  Culture, expectorated sputum-assessment     Status: None   Collection Time: 04/04/18  9:32 PM  Result Value Ref Range Status   Specimen Description SPUTUM  Final   Special Requests NONE  Final   Sputum evaluation   Final    THIS SPECIMEN IS ACCEPTABLE FOR SPUTUM CULTURE Performed at  [facility], [address], [place], [place] [address]    Report Status 04/05/2018 FINAL  Final  Culture, respiratory (NON-Expectorated)     Status: None   Collection Time: 04/04/18  9:32 PM  Result Value Ref Range Status   Specimen Description SPUTUM  Final   Special Requests NONE Reflexed from [id number]  Final   Gram Stain   Final    MODERATE WBC PRESENT, PREDOMINANTLY MONONUCLEAR FEW SQUAMOUS EPITHELIAL CELLS PRESENT FEW GRAM POSITIVE COCCI IN PAIRS RARE BUDDING YEAST SEEN    Culture   Final    FEW Consistent with normal respiratory flora. Performed at [facility], [address], [place], [place] [address]    Report Status 04/08/2018 FINAL  Final    Radiology Reports Dg Chest 1 View  Result Date: 04/09/2018 CLINICAL DATA:  Shortness of breath EXAM: CHEST  1 VIEW COMPARISON:  April 04, 2018 FINDINGS: Lungs are hyperexpanded with bibasilar atelectatic change. There is no frank edema or consolidation. Heart is mildly enlarged with pulmonary vascularity within normal limits. No adenopathy. There is lower thoracic dextroscoliosis. IMPRESSION: Lungs hyperexpanded with bibasilar atelectasis. No frank edema or consolidation. Stable cardiac prominence. Electronically Signed   By: Bretta Bang III M.D.   On: 04/09/2018 13:01   Dg Chest 2 View  Result Date: 04/04/2018 CLINICAL DATA:  Acute bronchitis.  COPD. EXAM: CHEST - 2 VIEW COMPARISON:  04/03/2018 and 06/25/2017 FINDINGS: The heart size and mediastinal contours are within normal limits. Pulmonary hyperinflation again seen, consistent with COPD. Left basilar scarring is again noted. No evidence of pulmonary infiltrate or edema. No evidence of pleural effusion. The visualized skeletal structures are unremarkable. IMPRESSION: Stable left lower lobe scarring and COPD.  No active lung  disease. Electronically Signed   By: Myles Rosenthal M.D.   On: 04/04/2018 13:23   Dg Chest 2 View  Result Date: 04/03/2018 CLINICAL DATA:   Worsening shortness of breath over the past 2 days. History COPD. EXAM: CHEST - 2 VIEW COMPARISON:  Chest x-ray dated June 25, 2017. FINDINGS: The heart size and mediastinal contours are within normal limits. Normal pulmonary vascularity. The lungs remain hyperinflated. No focal consolidation, pleural effusion, or pneumothorax. No acute osseous abnormality. IMPRESSION: COPD.  No active cardiopulmonary disease. Electronically Signed   By: Obie Dredge M.D.   On: 04/03/2018 09:17    Lab Data:  CBC: Recent Labs  Lab 04/04/18 0340 04/09/18 1306  WBC 8.2 6.1  NEUTROABS  --  3.2  HGB 12.2 12.4  HCT 38.8 40.9  MCV 95.8 98.8  PLT 95* 172   Basic Metabolic Panel: Recent Labs  Lab 04/04/18 0340 04/09/18 1306  NA 138 143  K 4.8 4.3  CL 104 98*  CO2 30 39*  GLUCOSE 127* 113*  BUN 13 20  CREATININE 0.67 0.62  CALCIUM 8.9 9.7  MG  --  2.0   GFR: Estimated Creatinine Clearance: 54.4 mL/min (by C-G formula based on SCr of 0.62 mg/dL). Liver Function Tests: Recent Labs  Lab 04/04/18 0340  AST 23  ALT 14  ALKPHOS 40  BILITOT 0.8  PROT 6.5  ALBUMIN 3.4*   No results for input(s): LIPASE, AMYLASE in the last 168 hours. No results for input(s): AMMONIA in the last 168 hours. Coagulation Profile: No results for input(s): INR, PROTIME in the last 168 hours. Cardiac Enzymes: No results for input(s): CKTOTAL, CKMB, CKMBINDEX, TROPONINI in the last 168 hours. BNP (last 3 results) No results for input(s): PROBNP in the last 8760 hours. HbA1C: No results for input(s): HGBA1C in the last 72 hours. CBG: Recent Labs  Lab 04/06/18 0813 04/07/18 0758 04/08/18 0758 04/09/18 0748 04/10/18 0734  GLUCAP 84 82 80 80 149*   Lipid Profile: No results for input(s): CHOL, HDL, LDLCALC, TRIG, CHOLHDL, LDLDIRECT in the last 72 hours. Thyroid Function Tests: No results for input(s): TSH, T4TOTAL, FREET4, T3FREE, THYROIDAB in the last 72 hours. Anemia Panel: No results for input(s):  VITAMINB12, FOLATE, FERRITIN, TIBC, IRON, RETICCTPCT in the last 72 hours. Urine analysis:    Component Value Date/Time   COLORURINE AMBER (A) 10/07/2016 2345   APPEARANCEUR CLOUDY (A) 10/07/2016 2345   LABSPEC 1.023 10/07/2016 2345   PHURINE 6.0 10/07/2016 2345   GLUCOSEU NEGATIVE 10/07/2016 2345   HGBUR TRACE (A) 10/07/2016 2345   BILIRUBINUR NEGATIVE 10/07/2016 2345   KETONESUR NEGATIVE 10/07/2016 2345   PROTEINUR NEGATIVE 10/07/2016 2345   UROBILINOGEN 1.0 10/01/2015 1225   NITRITE NEGATIVE 10/07/2016 2345   LEUKOCYTESUR NEGATIVE 10/07/2016 2345     Myrtie Neither M.D. [facility] 04/10/2018, 5:15 PM  After 7pm go to [email or web address] - password [email or web address]  Call night coverage person covering after 7pm

## 2018-04-11 DIAGNOSIS — J9601 Acute respiratory failure with hypoxia: Secondary | ICD-10-CM

## 2018-04-11 DIAGNOSIS — J441 Chronic obstructive pulmonary disease with (acute) exacerbation: Secondary | ICD-10-CM

## 2018-04-11 DIAGNOSIS — G8929 Other chronic pain: Secondary | ICD-10-CM

## 2018-04-11 DIAGNOSIS — F419 Anxiety disorder, unspecified: Secondary | ICD-10-CM

## 2018-04-11 DIAGNOSIS — M549 Dorsalgia, unspecified: Secondary | ICD-10-CM

## 2018-04-11 DIAGNOSIS — Z720 Tobacco use: Secondary | ICD-10-CM

## 2018-04-11 DIAGNOSIS — E43 Unspecified severe protein-calorie malnutrition: Secondary | ICD-10-CM

## 2018-04-11 LAB — CBC WITH DIFFERENTIAL/PLATELET
Basophils Absolute: 0 10*3/uL (ref 0.0–0.1)
Basophils Relative: 0 %
EOS ABS: 0 10*3/uL (ref 0.0–0.7)
EOS PCT: 0 %
HCT: 37.3 % (ref 36.0–46.0)
HEMOGLOBIN: 11.2 g/dL — AB (ref 12.0–15.0)
LYMPHS ABS: 1 10*3/uL (ref 0.7–4.0)
Lymphocytes Relative: 6 %
MCH: 29.6 pg (ref 26.0–34.0)
MCHC: 30 g/dL (ref 30.0–36.0)
MCV: 98.7 fL (ref 78.0–100.0)
MONO ABS: 1 10*3/uL (ref 0.1–1.0)
MONOS PCT: 6 %
NEUTROS PCT: 88 %
Neutro Abs: 14 10*3/uL — ABNORMAL HIGH (ref 1.7–7.7)
Platelets: 163 10*3/uL (ref 150–400)
RBC: 3.78 MIL/uL — ABNORMAL LOW (ref 3.87–5.11)
RDW: 13.4 % (ref 11.5–15.5)
WBC: 16 10*3/uL — ABNORMAL HIGH (ref 4.0–10.5)

## 2018-04-11 LAB — BLOOD GAS, ARTERIAL
ACID-BASE EXCESS: 6.2 mmol/L — AB (ref 0.0–2.0)
BICARBONATE: 31.2 mmol/L — AB (ref 20.0–28.0)
Drawn by: 517021
O2 CONTENT: 2 L/min
O2 SAT: 96.2 %
PCO2 ART: 54.2 mmHg — AB (ref 32.0–48.0)
PO2 ART: 82.9 mmHg — AB (ref 83.0–108.0)
Patient temperature: 98.6
pH, Arterial: 7.379 (ref 7.350–7.450)

## 2018-04-11 MED ORDER — METHYLPREDNISOLONE SODIUM SUCC 40 MG IJ SOLR
40.0000 mg | Freq: Two times a day (BID) | INTRAMUSCULAR | Status: DC
  Administered 2018-04-12 – 2018-04-13 (×3): 40 mg via INTRAVENOUS
  Filled 2018-04-11 (×3): qty 1

## 2018-04-11 NOTE — Progress Notes (Signed)
PROGRESS NOTE  Michele Lucas ZOX:[id number] DOB: 12-29-1965 DOA: 04/03/2018 PCP: Clare Gandy, MD  HPI  Michele Lucas is a 52 y.o. year old female with medical history significant for COPD, tobacco abuse, PTSD/anxiety, chronic pain/fibromyalgia, chronic malnutrition who presented on 04/03/2018 with active cough and dyspnea of 2 days and was found to have acute hypoxic respiratory failure secondary to COPD exacerbation.  Interval History  No acute events overnight  ROS: Minimal cough, no fevers or chills    Subjective States breathing is better, amenable to working with physical therapy and walking the halls  Assessment/Plan: Principal Problem:   COPD exacerbation (HCC) Active Problems:   Acute respiratory failure with hypoxia (HCC)   Chronic back pain   Anxiety   Thrombocytopenia (HCC)   Acute hyponatremia   Elevated lactic acid level   Protein-calorie malnutrition, severe   Nicotine abuse   1.  Acute hypoxic restaurant failure secondary to COPD exacerbation (related to metapneumovirus positive RVP), improving.  Continues to need 2 L nasal cannula.  Will do walking oxygen test to determine O2 needs (goal SPO2 greater than 80%).  DuoNeb's and Pulmicort scheduled, IV Solu-Medrol, taper frequency, 2 days remaining open (patient intolerant to oral prednisone).  Completed 8 days of doxycycline.  Supportive care Tessalon Perles, flutter valve discuss with case management to obtain equipment/nebulizer machine as approaching discharge.  No signs of CHF exacerbation, grade 1 diastolic dysfunction on TTE.  Chest x-ray x3 no signs of pneumonia or fluid overload.  2.  Tobacco abuse.  Patient pre-contemplative, would like to cut back O (currently smokes between 10 to 15 cigarettes a day), continue nicotine patch.  Encouraged tobacco cessation to preserve remaining lung function  3.  GERD, stable continue sucralfate and PPI #chronic back pain stable continue home Suboxone  4.  Diarrhea with  underlying PTSD, stable.  Retired better.  Continue Effexor  5.  Thrombocytopenia, resolved.  Stable platelets on CBC.  SCDs  6.  Protein calorie malnutrition, severe.  Continue Ensure, Magic cup supplementation per dietitian recommendations  7.  Acute hyponatremia, likely due to hypovolemia.  Resolved with IV fluids.  8.  Lactic acidosis, In setting of decreased fluid intake.  Resolved with IV fluids.  No sepsis physiology  9.  Mild aortic valve stenosis and mitral valve regurgitation, chronic.  Asymptomatic.  Followed at [facility].  Code Status: Full code  Family Communication: No family at bedside  Disposition Plan: 2 more days of IV steroids, determine oxygen requirements, anticipate discharge home will evaluate w/ PT   Consultants:  none  Procedures:  TTE, 4/30, EF 55-60%, grade 1 diastolic dysfunction.  Antimicrobials:  Doxycycline : 4/26-5/4  Ceftriaxone 4/26  Cultures:  Sputum, 4/27, normal respiratory flora  Telemetry:  DVT prophylaxis: CDs   Objective: Vitals:   04/10/18 2320 04/11/18 0750 04/11/18 0907 04/11/18 1445  BP: 120/76 122/65    Pulse: 99 82    Resp: 18     Temp: 98.4 F (36.9 C) 98 F (36.7 C)    TempSrc: Oral Oral    SpO2: 98% 96% 93% 93%  Weight:      Height:        Intake/Output Summary (Last 24 hours) at 04/11/2018 1527 Last data filed at 04/11/2018 0400 Gross per 24 hour  Intake 1437 ml  Output -  Net 1437 ml   Filed Weights   04/08/18 0500 04/09/18 0500 04/10/18 0621  Weight: 41.2 kg (90 lb 13.3 oz) 45 kg (99 lb 3.3 oz) 42.4 kg (93  lb 6.4 oz)    Exam:  Constitutional: Older female who looks older than stated age, in no apparent distress ENMT: Oropharynx with moist mucous membranes, poor dentition Cardiovascular: RRR no MRGs, with no peripheral edema Respiratory: Normal respiratory effort on 2 L nasal cannula, clear breath sounds  Abdomen: Soft,non-tender, with no HSM Skin: No rash ulcers, or lesions. Without skin tenting    Neurologic: Grossly no focal neuro deficit. Psychiatric:Appropriate affect, and mood. Mental status AAOx3  Data Reviewed: CBC: Recent Labs  Lab 04/09/18 1306 04/11/18 0337  WBC 6.1 16.0*  NEUTROABS 3.2 14.0*  HGB 12.4 11.2*  HCT 40.9 37.3  MCV 98.8 98.7  PLT 172 163   Basic Metabolic Panel: Recent Labs  Lab 04/09/18 1306 04/10/18 1805  NA 143 144  K 4.3 4.6  CL 98* 105  CO2 39* 33*  GLUCOSE 113* 134*  BUN 20 18  CREATININE 0.62 0.62  CALCIUM 9.7 9.6  MG 2.0  --    GFR: Estimated Creatinine Clearance: 54.4 mL/min (by C-G formula based on SCr of 0.62 mg/dL). Liver Function Tests: No results for input(s): AST, ALT, ALKPHOS, BILITOT, PROT, ALBUMIN in the last 168 hours. No results for input(s): LIPASE, AMYLASE in the last 168 hours. No results for input(s): AMMONIA in the last 168 hours. Coagulation Profile: No results for input(s): INR, PROTIME in the last 168 hours. Cardiac Enzymes: No results for input(s): CKTOTAL, CKMB, CKMBINDEX, TROPONINI in the last 168 hours. BNP (last 3 results) No results for input(s): PROBNP in the last 8760 hours. HbA1C: No results for input(s): HGBA1C in the last 72 hours. CBG: Recent Labs  Lab 04/06/18 0813 04/07/18 0758 04/08/18 0758 04/09/18 0748 04/10/18 0734  GLUCAP 84 82 80 80 149*   Lipid Profile: No results for input(s): CHOL, HDL, LDLCALC, TRIG, CHOLHDL, LDLDIRECT in the last 72 hours. Thyroid Function Tests: No results for input(s): TSH, T4TOTAL, FREET4, T3FREE, THYROIDAB in the last 72 hours. Anemia Panel: No results for input(s): VITAMINB12, FOLATE, FERRITIN, TIBC, IRON, RETICCTPCT in the last 72 hours. Urine analysis:    Component Value Date/Time   COLORURINE AMBER (A) 10/07/2016 2345   APPEARANCEUR CLOUDY (A) 10/07/2016 2345   LABSPEC 1.023 10/07/2016 2345   PHURINE 6.0 10/07/2016 2345   GLUCOSEU NEGATIVE 10/07/2016 2345   HGBUR TRACE (A) 10/07/2016 2345   BILIRUBINUR NEGATIVE 10/07/2016 2345   KETONESUR  NEGATIVE 10/07/2016 2345   PROTEINUR NEGATIVE 10/07/2016 2345   UROBILINOGEN 1.0 10/01/2015 1225   NITRITE NEGATIVE 10/07/2016 2345   LEUKOCYTESUR NEGATIVE 10/07/2016 2345   Sepsis Labs: (procalcitonin:4,lacticidven:4)  ) Recent Results (from the past 240 hour(s))  Blood culture (routine x 2)     Status: None   Collection Time: 04/03/18  8:55 AM  Result Value Ref Range Status   Specimen Description BLOOD LEFT WRIST  Final   Special Requests   Final    BOTTLES DRAWN AEROBIC AND ANAEROBIC Blood Culture adequate volume   Culture   Final    NO GROWTH 5 DAYS Performed at [facility], [address], [place], [place] [address]    Report Status 04/08/2018 FINAL  Final  Blood culture (routine x 2)     Status: None   Collection Time: 04/03/18 10:10 AM  Result Value Ref Range Status   Specimen Description BLOOD RIGHT HAND  Final   Special Requests   Final    BOTTLES DRAWN AEROBIC AND ANAEROBIC Blood Culture adequate volume   Culture   Final  NO GROWTH 5 DAYS Performed at [facility], [address] [place], [place] [address]    Report Status 04/08/2018 FINAL  Final  Respiratory Panel by PCR     Status: Abnormal   Collection Time: 04/03/18  1:17 PM  Result Value Ref Range Status   Adenovirus NOT DETECTED NOT DETECTED Final   Coronavirus 229E NOT DETECTED NOT DETECTED Final   Coronavirus HKU1 NOT DETECTED NOT DETECTED Final   Coronavirus NL63 NOT DETECTED NOT DETECTED Final   Coronavirus OC43 NOT DETECTED NOT DETECTED Final   Metapneumovirus DETECTED (A) NOT DETECTED Final   Rhinovirus / Enterovirus NOT DETECTED NOT DETECTED Final   Influenza A NOT DETECTED NOT DETECTED Final   Influenza B NOT DETECTED NOT DETECTED Final   Parainfluenza Virus 1 NOT DETECTED NOT DETECTED Final   Parainfluenza Virus 2 NOT DETECTED NOT DETECTED Final   Parainfluenza Virus 3 NOT DETECTED NOT DETECTED Final   Parainfluenza Virus 4 NOT DETECTED NOT DETECTED Final    Respiratory Syncytial Virus NOT DETECTED NOT DETECTED Final   Bordetella pertussis NOT DETECTED NOT DETECTED Final   Chlamydophila pneumoniae NOT DETECTED NOT DETECTED Final   Mycoplasma pneumoniae NOT DETECTED NOT DETECTED Final  Culture, expectorated sputum-assessment     Status: None   Collection Time: 04/04/18  9:32 PM  Result Value Ref Range Status   Specimen Description SPUTUM  Final   Special Requests NONE  Final   Sputum evaluation   Final    THIS SPECIMEN IS ACCEPTABLE FOR SPUTUM CULTURE Performed at [facility], [address] [place], [place] [address]    Report Status 04/05/2018 FINAL  Final  Culture, respiratory (NON-Expectorated)     Status: None   Collection Time: 04/04/18  9:32 PM  Result Value Ref Range Status   Specimen Description SPUTUM  Final   Special Requests NONE Reflexed from [id number]  Final   Gram Stain   Final    MODERATE WBC PRESENT, PREDOMINANTLY MONONUCLEAR FEW SQUAMOUS EPITHELIAL CELLS PRESENT FEW GRAM POSITIVE COCCI IN PAIRS RARE BUDDING YEAST SEEN    Culture   Final    FEW Consistent with normal respiratory flora. Performed at [facility], [address], [place], [place] [address]    Report Status 04/08/2018 FINAL  Final      Studies: No results found.  Scheduled Meds: . benzonatate  200 mg Oral TID  . budesonide (PULMICORT) nebulizer solution  0.5 mg Nebulization BID  . buprenorphine-naloxone  1 tablet Sublingual Daily  . doxycycline  100 mg Oral Q12H  . feeding supplement (ENSURE ENLIVE)  237 mL Oral TID BM  . ipratropium-albuterol  3 mL Nebulization TID  . methylPREDNISolone (SOLU-MEDROL) injection  40 mg Intravenous Q8H  . nicotine  21 mg Transdermal Daily  . ondansetron (ZOFRAN) IV  4 mg Intravenous Once  . pantoprazole  40 mg Oral BID  . sodium chloride flush  3 mL Intravenous Q12H  . sucralfate  1 g Oral TID WC & HS  . venlafaxine  75 mg Oral BID    Continuous Infusions: . sodium chloride 50 mL/hr at 04/11/18  0625     LOS: 8 days     Laverna Peace, MD [facility] Pager [phone]  If 7PM-7AM, please contact night-coverage [email or web address] Password [email or web address] 04/11/2018, 3:27 PM

## 2018-04-12 LAB — CBC
HEMATOCRIT: 36.2 % (ref 36.0–46.0)
HEMOGLOBIN: 11.1 g/dL — AB (ref 12.0–15.0)
MCH: 30.2 pg (ref 26.0–34.0)
MCHC: 30.7 g/dL (ref 30.0–36.0)
MCV: 98.4 fL (ref 78.0–100.0)
Platelets: 167 10*3/uL (ref 150–400)
RBC: 3.68 MIL/uL — AB (ref 3.87–5.11)
RDW: 13.4 % (ref 11.5–15.5)
WBC: 11.3 10*3/uL — ABNORMAL HIGH (ref 4.0–10.5)

## 2018-04-12 LAB — COMPREHENSIVE METABOLIC PANEL
ALBUMIN: 3 g/dL — AB (ref 3.5–5.0)
ALK PHOS: 42 U/L (ref 38–126)
ALT: 18 U/L (ref 14–54)
AST: 17 U/L (ref 15–41)
Anion gap: 4 — ABNORMAL LOW (ref 5–15)
BILIRUBIN TOTAL: 0.7 mg/dL (ref 0.3–1.2)
BUN: 16 mg/dL (ref 6–20)
CALCIUM: 9 mg/dL (ref 8.9–10.3)
CO2: 33 mmol/L — ABNORMAL HIGH (ref 22–32)
CREATININE: 0.64 mg/dL (ref 0.44–1.00)
Chloride: 105 mmol/L (ref 101–111)
GFR calc Af Amer: >60 mL/min (ref >60)
GFR calc non Af Amer: >60 mL/min (ref >60)
GLUCOSE: 105 mg/dL — AB (ref 65–99)
Potassium: 4.1 mmol/L (ref 3.5–5.1)
SODIUM: 142 mmol/L (ref 135–145)
TOTAL PROTEIN: 5.8 g/dL — AB (ref 6.5–8.1)

## 2018-04-12 LAB — GLUCOSE, CAPILLARY: Glucose-Capillary: 101 mg/dL — ABNORMAL HIGH (ref 65–99)

## 2018-04-12 MED ORDER — GLUCERNA SHAKE PO LIQD
237.0000 mL | Freq: Three times a day (TID) | ORAL | Status: DC
  Administered 2018-04-13 (×2): 237 mL via ORAL
  Filled 2018-04-12 (×2): qty 237

## 2018-04-12 MED ORDER — GI COCKTAIL ~~LOC~~
30.0000 mL | Freq: Once | ORAL | Status: AC
  Administered 2018-04-12: 30 mL via ORAL
  Filled 2018-04-12: qty 30

## 2018-04-12 MED ORDER — GLUCERNA SHAKE PO LIQD
237.0000 mL | Freq: Three times a day (TID) | ORAL | Status: DC
  Administered 2018-04-12: 237 mL via ORAL

## 2018-04-12 NOTE — Progress Notes (Signed)
SATURATION QUALIFICATIONS: (This note is used to comply with regulatory documentation for home oxygen)  Patient Saturations on Room Air at Rest = 88%  Patient Saturations on Room Air while Ambulating = 85%  Patient Saturations on 2 Liters of oxygen while Ambulating = 92%  Please briefly explain why patient needs home oxygen: Requires supplemental O2 to maintain SpO2 levels >88% with actvity  Charlsie Merles, PT, DPT

## 2018-04-12 NOTE — Progress Notes (Signed)
Pt c/o abdominal pain across stomach 10/10. Pt states she feels like it is due to the solu-medrol. Pt did not receive any solu-medrol last night. Pt also has carafate ordered to help prevent any stomach issues the solu-medrol may cause. Pt has been taking solu-medrol since 5/2, today is the first time she has complained of stomach pain. MD notified. Received orders for GI cocktail.  Will continue to monitor. Caswell Corwin, RN 04/12/18 6:52 AM

## 2018-04-12 NOTE — Progress Notes (Signed)
PROGRESS NOTE  Michele Lucas JYN:[id number] DOB: 1966/05/30 DOA: 04/03/2018 PCP: Clare Gandy, MD  HPI  Michele Lucas is a 52 y.o. year old female with medical history significant for COPD, tobacco abuse, PTSD/anxiety, chronic pain/fibromyalgia, chronic malnutrition who presented on 04/03/2018 with active cough and dyspnea of 2 days and was found to have acute hypoxic respiratory failure secondary to COPD exacerbation.  Interval History  No acute events overnight  ROS: Minimal cough, no fevers or chills    Subjective OK breathing.  Some abdominal pain this morning.  Better after bowel movement  Assessment/Plan: Principal Problem:   COPD exacerbation (HCC) Active Problems:   Acute respiratory failure with hypoxia (HCC)   Chronic back pain   Anxiety   Thrombocytopenia (HCC)   Acute hyponatremia   Elevated lactic acid level   Protein-calorie malnutrition, severe   Nicotine abuse   1.  Acute hypoxic restaurant failure secondary to COPD exacerbation (related to metapneumovirus positive RVP), improving.  Oxygen test required 2 L O2 desaturation to 85% on room air.     Pulmicort scheduled, last day of IV Solu-Medrol (patient intolerant to oral prednisone), de-escalate today PRN duo nebs.  Completed 8 days of doxycycline.  Supportive care Tessalon Perles, flutter valve. Discuss w/case management oxygen and nebulizer machine for likely d/c in 24 hours.     2. Abdominal pain, improving.  Suspect related to constipation.  Will obtain LFTs.  Continue to monitor  3. Tobacco abuse.  Patient pre-contemplative, would like to cut back  (currently smokes between 10 to 15 cigarettes a day), continue nicotine patch.  Encouraged tobacco cessation to preserve remaining lung function  4. GERD, stable continue sucralfate and PPI  5. Chronic back pain stable continue home Suboxone  6. Anxiety with underlying PTSD, stable.  Retired Cytogeneticist.  Continue Effexor  7. Thrombocytopenia, resolved.  Stable  platelets on CBC.  SCDs  8. Protein calorie malnutrition, severe.  Continue Ensure, Magic cup supplementation per dietitian recommendations  9. Acute hyponatremia, likely due to hypovolemia.  Resolved with IV fluids.  10. Lactic acidosis, In setting of decreased fluid intake.  Resolved with IV fluids.  No sepsis physiology  11. Mild aortic valve stenosis and mitral valve regurgitation, chronic.  Asymptomatic.  Follow[facility] at [facility].  Code Status: Full code  Family Communication: No family at bedside  Disposition Plan: State of IV steroids, will need oxygen on discharge, anticipate discharge home in 24 hours  Consultants:  none  Procedures:  TTE, 4/30, EF 55-60%, grade 1 diastolic dysfunction.  Antimicrobials:  Doxycycline : 4/26-5/4  Ceftriaxone 4/26  Cultures:  Sputum, 4/27, normal respiratory flora  Telemetry:  DVT prophylaxis: CDs   Objective: Vitals:   04/11/18 2045 04/11/18 2347 04/12/18 0720 04/12/18 0804  BP:  129/67  120/68  Pulse: 84 95  100  Resp: Temp:  98.4 F (36.9 C)  98.6 F (37 C)  TempSrc:  Oral  Oral  SpO2: 95% 99% 93% 95%  Weight:      Height:        Intake/Output Summary (Last 24 hours) at 04/12/2018 0816 Last data filed at 04/11/2018 2349 Gross per 24 hour  Intake 711 ml  Output 150 ml  Net 561 ml   Filed Weights   04/08/18 0500 04/09/18 0500 04/10/18 8657  Weight: 41.2 kg (90 lb 13.3 oz) 45 kg (99 lb 3.3 oz) 42.4 kg (93 lb 6.4 oz)    Exam:  Constitutional: Thin, older female who looks older  than stated age, in no apparent distress ENMT: Oropharynx with moist mucous membranes, poor dentition Cardiovascular: RRR no MRGs, with no peripheral edema Respiratory: Normal respiratory effort on 2 L nasal cannula, clear breath sounds  Abdomen: Soft, mildly tender in central quadrant, no rebound tenderness or guarding Skin: No rash ulcers, or lesions. Without skin tenting  Neurologic: Grossly no focal neuro  deficit. Psychiatric:Appropriate affect, and mood. Mental status AAOx3  Data Reviewed: CBC: Recent Labs  Lab 04/09/18 1306 04/11/18 0337  WBC 6.1 16.0*  NEUTROABS 3.2 14.0*  HGB 12.4 11.2*  HCT 40.9 37.3  MCV 98.8 98.7  PLT 172 163   Basic Metabolic Panel: Recent Labs  Lab 04/09/18 1306 04/10/18 1805  NA 143 144  K 4.3 4.6  CL 98* 105  CO2 39* 33*  GLUCOSE 113* 134*  BUN 20 18  CREATININE 0.62 0.62  CALCIUM 9.7 9.6  MG 2.0  --    GFR: Estimated Creatinine Clearance: 54.4 mL/min (by C-G formula based on SCr of 0.62 mg/dL). Liver Function Tests: No results for input(s): AST, ALT, ALKPHOS, BILITOT, PROT, ALBUMIN in the last 168 hours. No results for input(s): LIPASE, AMYLASE in the last 168 hours. No results for input(s): AMMONIA in the last 168 hours. Coagulation Profile: No results for input(s): INR, PROTIME in the last 168 hours. Cardiac Enzymes: No results for input(s): CKTOTAL, CKMB, CKMBINDEX, TROPONINI in the last 168 hours. BNP (last 3 results) No results for input(s): PROBNP in the last 8760 hours. HbA1C: No results for input(s): HGBA1C in the last 72 hours. CBG: Recent Labs  Lab 04/07/18 0758 04/08/18 0758 04/09/18 0748 04/10/18 0734 04/12/18 0721  GLUCAP 82 80 80 149* 101*   Lipid Profile: No results for input(s): CHOL, HDL, LDLCALC, TRIG, CHOLHDL, LDLDIRECT in the last 72 hours. Thyroid Function Tests: No results for input(s): TSH, T4TOTAL, FREET4, T3FREE, THYROIDAB in the last 72 hours. Anemia Panel: No results for input(s): VITAMINB12, FOLATE, FERRITIN, TIBC, IRON, RETICCTPCT in the last 72 hours. Urine analysis:    Component Value Date/Time   COLORURINE AMBER (A) 10/07/2016 2345   APPEARANCEUR CLOUDY (A) 10/07/2016 2345   LABSPEC 1.023 10/07/2016 2345   PHURINE 6.0 10/07/2016 2345   GLUCOSEU NEGATIVE 10/07/2016 2345   HGBUR TRACE (A) 10/07/2016 2345   BILIRUBINUR NEGATIVE 10/07/2016 2345   KETONESUR NEGATIVE 10/07/2016 2345    PROTEINUR NEGATIVE 10/07/2016 2345   UROBILINOGEN 1.0 10/01/2015 1225   NITRITE NEGATIVE 10/07/2016 2345   LEUKOCYTESUR NEGATIVE 10/07/2016 2345   Sepsis Labs: (procalcitonin:4,lacticidven:4)  ) Recent Results (from the past 240 hour(s))  Blood culture (routine x 2)     Status: None   Collection Time: 04/03/18  8:55 AM  Result Value Ref Range Status   Specimen Description BLOOD LEFT WRIST  Final   Special Requests   Final    BOTTLES DRAWN AEROBIC AND ANAEROBIC Blood Culture adequate volume   Culture   Final    NO GROWTH 5 DAYS Performed at [facility], [address], [place], [place] [address]    Report Status 04/08/2018 FINAL  Final  Blood culture (routine x 2)     Status: None   Collection Time: 04/03/18 10:10 AM  Result Value Ref Range Status   Specimen Description BLOOD RIGHT HAND  Final   Special Requests   Final    BOTTLES DRAWN AEROBIC AND ANAEROBIC Blood Culture adequate volume   Culture   Final    NO GROWTH 5 DAYS Performed at [facility], [address], [place], [place] [address]    Report Status 04/08/2018 FINAL  Final  Respiratory Panel by PCR     Status: Abnormal   Collection Time: 04/03/18  1:17 PM  Result Value Ref Range Status   Adenovirus NOT DETECTED NOT DETECTED Final   Coronavirus 229E NOT DETECTED NOT DETECTED Final   Coronavirus HKU1 NOT DETECTED NOT DETECTED Final   Coronavirus NL63 NOT DETECTED NOT DETECTED Final   Coronavirus OC43 NOT DETECTED NOT DETECTED Final   Metapneumovirus DETECTED (A) NOT DETECTED Final   Rhinovirus / Enterovirus NOT DETECTED NOT DETECTED Final   Influenza A NOT DETECTED NOT DETECTED Final   Influenza B NOT DETECTED NOT DETECTED Final   Parainfluenza Virus 1 NOT DETECTED NOT DETECTED Final   Parainfluenza Virus 2 NOT DETECTED NOT DETECTED Final   Parainfluenza Virus 3 NOT DETECTED NOT DETECTED Final   Parainfluenza Virus 4 NOT DETECTED NOT DETECTED Final   Respiratory Syncytial Virus NOT  DETECTED NOT DETECTED Final   Bordetella pertussis NOT DETECTED NOT DETECTED Final   Chlamydophila pneumoniae NOT DETECTED NOT DETECTED Final   Mycoplasma pneumoniae NOT DETECTED NOT DETECTED Final  Culture, expectorated sputum-assessment     Status: None   Collection Time: 04/04/18  9:32 PM  Result Value Ref Range Status   Specimen Description SPUTUM  Final   Special Requests NONE  Final   Sputum evaluation   Final    THIS SPECIMEN IS ACCEPTABLE FOR SPUTUM CULTURE Performed at [facility], [address] [place], [place] [address]    Report Status 04/05/2018 FINAL  Final  Culture, respiratory (NON-Expectorated)     Status: None   Collection Time: 04/04/18  9:32 PM  Result Value Ref Range Status   Specimen Description SPUTUM  Final   Special Requests NONE Reflexed from [id number]  Final   Gram Stain   Final    MODERATE WBC PRESENT, PREDOMINANTLY MONONUCLEAR FEW SQUAMOUS EPITHELIAL CELLS PRESENT FEW GRAM POSITIVE COCCI IN PAIRS RARE BUDDING YEAST SEEN    Culture   Final    FEW Consistent with normal respiratory flora. Performed at [facility], [address], [place], [place] [address]    Report Status 04/08/2018 FINAL  Final      Studies: No results found.  Scheduled Meds: . benzonatate  200 mg Oral TID  . budesonide (PULMICORT) nebulizer solution  0.5 mg Nebulization BID  . buprenorphine-naloxone  1 tablet Sublingual Daily  . doxycycline  100 mg Oral Q12H  . feeding supplement (ENSURE ENLIVE)  237 mL Oral TID BM  . ipratropium-albuterol  3 mL Nebulization TID  . methylPREDNISolone (SOLU-MEDROL) injection  40 mg Intravenous Q12H  . nicotine  21 mg Transdermal Daily  . ondansetron (ZOFRAN) IV  4 mg Intravenous Once  . pantoprazole  40 mg Oral BID  . sodium chloride flush  3 mL Intravenous Q12H  . sucralfate  1 g Oral TID WC & HS  . venlafaxine  75 mg Oral BID    Continuous Infusions:    LOS: 9 days     Laverna Peace, MD [facility] Pager  [phone]  If 7PM-7AM, please contact night-coverage [email or web address] Password [email or web address] 04/12/2018, 8:16 AM

## 2018-04-12 NOTE — Evaluation (Signed)
Physical Therapy Evaluation and Discharge Patient Details Name: Michele Lucas MRN: [id number] DOB: August 16, 1966 Today's Date: 04/12/2018   History of Present Illness  52 y.o. year old female with medical history significant for COPD, tobacco abuse, PTSD/anxiety, chronic pain/fibromyalgia, chronic malnutrition who presented on 04/03/2018 with active cough and dyspnea of 2 days and was found to have acute hypoxic respiratory failure secondary to COPD exacerbation.  Clinical Impression  Patient evaluated by Physical Therapy with no further acute PT needs identified. All education has been completed and the patient has no further questions. [name] is doing quite well functionally. See SpO2 note for details on ambulatory oxygen levels. Lives at home with daughter, denies any falls other then the instance where she collapsed most likely from low O2 levels. Uses a cane at baseline due to back pain but otherwise is independent. See below for any follow-up Physical Therapy or equipment needs. PT is signing off. Thank you for this referral.     Follow Up Recommendations No PT follow up    Equipment Recommendations  None recommended by PT    Recommendations for Other Services       Precautions / Restrictions Precautions Precautions: Fall Precaution Comments: Reports fall at home Restrictions Weight Bearing Restrictions: No      Mobility  Bed Mobility Overal bed mobility: Independent                Transfers Overall transfer level: Modified independent Equipment used: None             General transfer comment: Able to self correct balance. No assist required.  Ambulation/Gait Ambulation/Gait assistance: Supervision;Modified independent (Device/Increase time) Ambulation Distance (Feet): 350 Feet Assistive device: None Gait Pattern/deviations: Decreased stride length Gait velocity: decreased Gait velocity interpretation: 1.31 - 2.62 ft/sec, indicative of limited community  ambulator General Gait Details: Gait overall within functional limits. No significant instability noted. No dyspnea however see O2 sats below. Cues for pursed lip breathing and energy conservation techniques.  Stairs            Wheelchair Mobility    Modified Rankin (Stroke Patients Only)       Balance Overall balance assessment: Modified Independent                                           Pertinent Vitals/Pain Pain Assessment: Faces Faces Pain Scale: Hurts a little bit Pain Location: back Pain Descriptors / Indicators: Constant Pain Intervention(s): Limited activity within patient's tolerance;Monitored during session    Home Living Family/patient expects to be discharged to:: Private residence Living Arrangements: Children Available Help at Discharge: Available PRN/intermittently Type of Home: House Home Access: Stairs to enter;Ramped entrance   Entrance Stairs-Number of Steps: 2 Home Layout: One level Home Equipment: Environmental consultant - 2 wheels;Bedside commode;Shower seat;Wheelchair - manual;Cane - single point      Prior Function Level of Independence: Independent with assistive device(s)         Comments: uses cane for gait due to back pain. Takes care of grandson while daughter works second shift.     Hand Dominance   Dominant Hand: Right    Extremity/Trunk Assessment   Upper Extremity Assessment Upper Extremity Assessment: Defer to OT evaluation    Lower Extremity Assessment Lower Extremity Assessment: Generalized weakness       Communication   Communication: No difficulties  Cognition Arousal/Alertness: Awake/alert Behavior During Therapy:  WFL for tasks assessed/performed Overall Cognitive Status: Within Functional Limits for tasks assessed                                        General Comments General comments (skin integrity, edema, etc.): Sats: at rest on1L supplemental O2 94%. Ambulating on room air 85%,  on 1L 87-88%, on 2L 91-92%. HR 130 while ambulating 98 bpm at rest.    Exercises     Assessment/Plan    PT Assessment Patent does not need any further PT services  PT Problem List         PT Treatment Interventions      PT Goals (Current goals can be found in the Care Plan section)  Acute Rehab PT Goals Patient Stated Goal: Get back home and have more energy PT Goal Formulation: All assessment and education complete, DC therapy    Frequency     Barriers to discharge        Co-evaluation               AM-PAC PT "6 Clicks" Daily Activity  Outcome Measure Difficulty turning over in bed (including adjusting bedclothes, sheets and blankets)?: None Difficulty moving from lying on back to sitting on the side of the bed? : None Difficulty sitting down on and standing up from a chair with arms (e.g., wheelchair, bedside commode, etc,.)?: None Help needed moving to and from a bed to chair (including a wheelchair)?: None Help needed walking in hospital room?: None Help needed climbing 3-5 steps with a railing? : A Little 6 Click Score: 23    End of Session Equipment Utilized During Treatment: Gait belt;Oxygen Activity Tolerance: Patient tolerated treatment well Patient left: in bed;with call bell/phone within reach;with SCD's reapplied Nurse Communication: Mobility status      Time: 1610-9604 PT Time Calculation (min) (ACUTE ONLY): 27 min   Charges:   PT Evaluation $PT Eval Moderate Complexity: 1 Mod PT Treatments $Self Care/Home Management: 8-22   PT G Codes:        Charlsie Merles, PT, DPT  [place] 04/12/2018, 10:36 AM

## 2018-04-13 DIAGNOSIS — D696 Thrombocytopenia, unspecified: Secondary | ICD-10-CM

## 2018-04-13 DIAGNOSIS — R7989 Other specified abnormal findings of blood chemistry: Secondary | ICD-10-CM

## 2018-04-13 DIAGNOSIS — E871 Hypo-osmolality and hyponatremia: Secondary | ICD-10-CM

## 2018-04-13 LAB — GLUCOSE, CAPILLARY: Glucose-Capillary: 104 mg/dL — ABNORMAL HIGH (ref 65–99)

## 2018-04-13 MED ORDER — GLUCERNA SHAKE PO LIQD
237.0000 mL | Freq: Three times a day (TID) | ORAL | 0 refills | Status: DC
Start: 2018-04-13 — End: 2022-05-27

## 2018-04-13 MED ORDER — MOMETASONE FURO-FORMOTEROL FUM 200-5 MCG/ACT IN AERO
2.0000 | INHALATION_SPRAY | Freq: Two times a day (BID) | RESPIRATORY_TRACT | Status: DC
  Administered 2018-04-13: 2 via RESPIRATORY_TRACT
  Filled 2018-04-13: qty 8.8

## 2018-04-13 MED ORDER — BENZONATATE 200 MG PO CAPS: 200.0000 mg | ORAL_CAPSULE | Freq: Three times a day (TID) | ORAL | 0 refills | Status: DC

## 2018-04-13 MED ORDER — MOMETASONE FURO-FORMOTEROL FUM 200-5 MCG/ACT IN AERO: 2.0000 | INHALATION_SPRAY | Freq: Two times a day (BID) | RESPIRATORY_TRACT | 0 refills | Status: DC

## 2018-04-13 MED ORDER — NICOTINE 21 MG/24HR TD PT24: 21.0000 mg | MEDICATED_PATCH | Freq: Every day | TRANSDERMAL | 0 refills | Status: DC

## 2018-04-13 MED ORDER — IPRATROPIUM-ALBUTEROL 0.5-2.5 (3) MG/3ML IN SOLN: 3.0000 mL | Freq: Two times a day (BID) | RESPIRATORY_TRACT | Status: DC

## 2018-04-13 NOTE — Discharge Summary (Signed)
Discharge Summary  Michele Lucas:[id number] DOB: 1966/03/16  PCP: Clare Gandy, MD  Admit date: 04/03/2018 Discharge date:    Time spent: < 25 minutes  Admitted From: Home Disposition: Home  Recommendations for Outpatient Follow-up:  1. Started on Advair for COPD.  Continue PRN albuterol 2. Follow-up with PCP in 1 to 2 weeks.  Home Health: No Equipment/Devices: 2 L nasal cannula, portable oxygen provided on discharge Discharge Diagnoses:  Active Hospital Problems   Diagnosis Date Noted  . COPD exacerbation (HCC) 04/01/2015  . Protein-calorie malnutrition, severe 04/04/2018  . Nicotine abuse 04/04/2018  . Acute respiratory failure with hypoxia (HCC) 04/03/2018  . Chronic back pain 04/03/2018  . Anxiety 04/03/2018  . Thrombocytopenia (HCC) 04/03/2018  . Acute hyponatremia 04/03/2018  . Elevated lactic acid level 04/03/2018    Resolved Hospital Problems  No resolved problems to display.    Discharge Condition: Stable  CODE STATUS: Full Diet recommendation: Heart Healthy / Carb Modified / Regular / Dysphagia   Vitals:   04/13/18 0734 04/13/18 1132  BP: 110/65   Pulse: 91   Resp:    Temp: 98.5 F (36.9 C)   SpO2: 97% 96%    History of present illness:  Michele Lucas is a 52 y.o. year old female with medical history significant for COPD, tobacco abuse, PTSD/anxiety, chronic pain/fibromyalgia, chronic malnutrition who presented on 04/03/2018 with reductive cough and progressively worsening dyspnea of 2 days and was found to have acute hypoxic respiratory failure secondary to COPD exacerbation. Remaining hospital course addressed in problem based format below:   Hospital Course:  Principal Problem:   COPD exacerbation (HCC) Active Problems:   Acute respiratory failure with hypoxia (HCC)   Chronic back pain   Anxiety   Thrombocytopenia (HCC)   Acute hyponatremia   Elevated lactic acid level   Protein-calorie malnutrition, severe   Nicotine  abuse  1. Acute hypoxic respiratory failure, secondary to COPD exacerbation in setting of metapneumovirus, resolved.  Patient was tachycardic and tachypneic on presentation but without use of accessory muscle use.  Chest x-ray without any infiltrates but did show hyperinflation.  D-dimer negative.  Patient was markedly hypoxic to 86% on room air requiring Atrovent and continuous albuterol nebs in the ED and 4 L nasal cannula.  RVP was positive for metapneumovirus, negative for flu.  Blood cultures were negative.  TTE showed grade 1 diastolic dysfunction but no signs of volume overload on physical exam or chest x-ray (BNP 16).  Patient's breathing improved significantly with 5 days of IV Solu-Medrol (declined oral prednisone due to intolerance), 10 days of doxycycline completed, scheduled Xopenex, Atrovent nebs, Pulmicort.  Chest x-ray continued to show no infiltrates or volume overload.  Was transitioned to [facility] on discharge.  Required 2 L oxygen on walking ambulatory test which was provided also on discharge.  Strongly counseled on nicotine cessation and provided nicotine patch during admission and prescribed on discharge with Tessalon Perles, flutter valve, incentive telemetry.  2. Lactic acidosis, resolved.  No concern for sepsis physiology as patient remained afebrile with no leukocytosis or other localizing signs of infection.  Lactic acid resolved with supportive care.  3. Protein calorie malnutrition, severe.  Nutrition recommended Magic cup supplementation and Ensure  4. Acute hyponatremia, resolved.  Likely due to hypovolemia, resolved with IV fluids.  5. Thrombocytopenia, resolved.  Platelets 96 on admission.  HIV negative.  Resolved to normal with supportive care.  Did not require transfusions.  Did not have any bleeding episodes during admission.  6.  Moderate aortic valve stenosis and mitral valve regurgitation, chronic.  Asymptomatic.  [facility]  7. Chronic back pain, stable.   Continued on home Suboxone  8. Anxiety with underlying PTSD, stable.  Retired Cytogeneticist.  Continue Effexor.  Antimicrobials:  Doxycycline : 4/26-5/4  Ceftriaxone 4/26  Cultures:  Sputum, 4/27, normal respiratory flora  Blood, 4/26, no growth over 5 days   Consultations:  None  Discharge Exam: BP 110/65 (BP Location: Right Arm)   Pulse 91   Temp 98.5 F (36.9 C) (Oral)   Resp (!) 26   Ht  (1.575 m)   Wt 48.4 kg (106 lb 11.2 oz)   SpO2 96%   BMI 19.52 kg/m   Constitutional: Thin, older female who looks older than stated age, in no apparent distress ENMT: Oropharynx with moist mucous membranes, poor dentition Cardiovascular: RRR no MRGs, with no peripheral edema Respiratory: Normal respiratory effort on 2 L nasal cannula, clear breath sounds  Skin: No rash ulcers, or lesions. Without skin tenting  Neurologic: Grossly no focal neuro deficit. Psychiatric:Appropriate affect, and mood. Mental status AAOx3   Procedures/Studies:  TTE, 4/30, EF 55-60%, grade 1 diastolic dysfunction, moderate aortic stenosis, moderate aortic regurgitation, mild mitral valve regurgitation    Dg Chest 1 View  Result Date: 04/09/2018 CLINICAL DATA:  Shortness of breath EXAM: CHEST  1 VIEW COMPARISON:  April 04, 2018 FINDINGS: Lungs are hyperexpanded with bibasilar atelectatic change. There is no frank edema or consolidation. Heart is mildly enlarged with pulmonary vascularity within normal limits. No adenopathy. There is lower thoracic dextroscoliosis. IMPRESSION: Lungs hyperexpanded with bibasilar atelectasis. No frank edema or consolidation. Stable cardiac prominence. Electronically Signed   By: Bretta Bang III M.D.   On: 04/09/2018 13:01   Dg Chest 2 View  Result Date: 04/04/2018 CLINICAL DATA:  Acute bronchitis.  COPD. EXAM: CHEST - 2 VIEW COMPARISON:  04/03/2018 and 06/25/2017 FINDINGS: The heart size and mediastinal contours are within normal limits. Pulmonary hyperinflation  again seen, consistent with COPD. Left basilar scarring is again noted. No evidence of pulmonary infiltrate or edema. No evidence of pleural effusion. The visualized skeletal structures are unremarkable. IMPRESSION: Stable left lower lobe scarring and COPD.  No active lung disease. Electronically Signed   By: Myles Rosenthal M.D.   On: 04/04/2018 13:23   Dg Chest 2 View  Result Date: 04/03/2018 CLINICAL DATA:  Worsening shortness of breath over the past 2 days. History COPD. EXAM: CHEST - 2 VIEW COMPARISON:  Chest x-ray dated June 25, 2017. FINDINGS: The heart size and mediastinal contours are within normal limits. Normal pulmonary vascularity. The lungs remain hyperinflated. No focal consolidation, pleural effusion, or pneumothorax. No acute osseous abnormality. IMPRESSION: COPD.  No active cardiopulmonary disease. Electronically Signed   By: Obie Dredge M.D.   On: 04/03/2018 09:17      Discharge Instructions You were cared for by a hospitalist during your hospital stay. If you have any questions about your discharge medications or the care you received while you were in the hospital after you are discharged, you can call the unit and asked to speak with the hospitalist on call if the hospitalist that took care of you is not available. Once you are discharged, your primary care physician will handle any further medical issues. Please note that NO REFILLS for any discharge medications will be authorized once you are discharged, as it is imperative that you return to your primary care physician (or establish a relationship with a primary care  physician if you do not have one) for your aftercare needs so that they can reassess your need for medications and monitor your lab values.  Discharge Instructions    Diet - low sodium heart healthy   Complete by:  As directed    Increase activity slowly   Complete by:  As directed      Allergies as of 04/13/2018      Reactions   Guaifenesin & Derivatives  Shortness Of Breath   "Mucinex brand"   Ibuprofen    Upset stomach   Neurontin [gabapentin] Other (See Comments)   Reported by [facility] - slowed heart rate per pt   Tylenol [acetaminophen]    Upset stomach   Ampicillin Hives, Rash   Has patient had a PCN reaction causing immediate rash, facial/tongue/throat swelling, SOB or lightheadedness with hypotension: Yes Has patient had a PCN reaction causing severe rash involving mucus membranes or skin necrosis: No Has patient had a PCN reaction that required hospitalization: Unknown Has patient had a PCN reaction occurring within the last 10 years: Unknown If all of the above answers are "NO", then may proceed with Cephalosporin use.   Clindamycin/lincomycin Swelling, Rash   Have these reactions in feet, ankles and legs.      Medication List    STOP taking these medications   azithromycin 250 MG tablet Commonly known as:  ZITHROMAX   carbamide peroxide 6.5 % OTIC solution Commonly known as:  DEBROX     TAKE these medications   albuterol 108 (90 Base) MCG/ACT inhaler Commonly known as:  PROVENTIL HFA;VENTOLIN HFA Inhale 2 puffs into the lungs 5 (five) times daily as needed for wheezing or shortness of breath.   benzonatate 200 MG capsule Commonly known as:  TESSALON Take 1 capsule (200 mg total) by mouth 3 (three) times daily.   buprenorphine-naloxone 2-0.5 mg Subl SL tablet Commonly known as:  SUBOXONE Place 3 tablets under the tongue daily.   chlorhexidine 0.12 % solution Commonly known as:  PERIDEX Use as directed in the mouth or throat 2 (two) times daily as needed (gum swelling).   cholecalciferol 1000 units tablet Commonly known as:  VITAMIN D Take 1,000 Units by mouth 2 (two) times daily.   feeding supplement (GLUCERNA SHAKE) Liqd Take 237 mLs by mouth 3 (three) times daily between meals.   mometasone-formoterol 200-5 MCG/ACT Aero Commonly known as:  DULERA Inhale 2 puffs into the lungs 2 (two)  times daily.   nicotine 21 mg/24hr patch Commonly known as:  NICODERM CQ - dosed in mg/24 hours Place 1 patch (21 mg total) onto the skin daily. Start taking on:  04/14/2018   venlafaxine XR 75 MG 24 hr capsule Commonly known as:  EFFEXOR-XR Take 225 mg by mouth daily with breakfast.   vitamin B-12 100 MCG tablet Commonly known as:  CYANOCOBALAMIN Take 200 mcg by mouth daily.            Durable Medical Equipment  (From admission, onward)        Start     Ordered   04/13/18 0829  For home use only DME oxygen  Once    Question Answer Comment  Mode or (Route) Nasal cannula   Liters per Minute 2   Frequency Continuous (stationary and portable oxygen unit needed)   Oxygen delivery system Gas      04/13/18 0829     Allergies  Allergen Reactions  . Guaifenesin & Derivatives Shortness Of Breath    "  Mucinex brand"  . Ibuprofen     Upset stomach  . Neurontin [Gabapentin] Other (See Comments)    Reported by [facility] - slowed heart rate per pt  . Tylenol [Acetaminophen]     Upset stomach  . Ampicillin Hives and Rash    Has patient had a PCN reaction causing immediate rash, facial/tongue/throat swelling, SOB or lightheadedness with hypotension: Yes Has patient had a PCN reaction causing severe rash involving mucus membranes or skin necrosis: No Has patient had a PCN reaction that required hospitalization: Unknown Has patient had a PCN reaction occurring within the last 10 years: Unknown If all of the above answers are "NO", then may proceed with Cephalosporin use.  . Clindamycin/Lincomycin Swelling and Rash    Have these reactions in feet, ankles and legs.   Follow-up Information    [facility] - Dme Follow up.   Why:  home oxygen. Contact information: [address] [place] [address] [phone]            The results of significant diagnostics from this hospitalization (including imaging, microbiology, ancillary and  laboratory) are listed below for reference.    Significant Diagnostic Studies: Dg Chest 1 View  Result Date: 04/09/2018 CLINICAL DATA:  Shortness of breath EXAM: CHEST  1 VIEW COMPARISON:  April 04, 2018 FINDINGS: Lungs are hyperexpanded with bibasilar atelectatic change. There is no frank edema or consolidation. Heart is mildly enlarged with pulmonary vascularity within normal limits. No adenopathy. There is lower thoracic dextroscoliosis. IMPRESSION: Lungs hyperexpanded with bibasilar atelectasis. No frank edema or consolidation. Stable cardiac prominence. Electronically Signed   By: Bretta Bang III M.D.   On: 04/09/2018 13:01   Dg Chest 2 View  Result Date: 04/04/2018 CLINICAL DATA:  Acute bronchitis.  COPD. EXAM: CHEST - 2 VIEW COMPARISON:  04/03/2018 and 06/25/2017 FINDINGS: The heart size and mediastinal contours are within normal limits. Pulmonary hyperinflation again seen, consistent with COPD. Left basilar scarring is again noted. No evidence of pulmonary infiltrate or edema. No evidence of pleural effusion. The visualized skeletal structures are unremarkable. IMPRESSION: Stable left lower lobe scarring and COPD.  No active lung disease. Electronically Signed   By: Myles Rosenthal M.D.   On: 04/04/2018 13:23   Dg Chest 2 View  Result Date: 04/03/2018 CLINICAL DATA:  Worsening shortness of breath over the past 2 days. History COPD. EXAM: CHEST - 2 VIEW COMPARISON:  Chest x-ray dated June 25, 2017. FINDINGS: The heart size and mediastinal contours are within normal limits. Normal pulmonary vascularity. The lungs remain hyperinflated. No focal consolidation, pleural effusion, or pneumothorax. No acute osseous abnormality. IMPRESSION: COPD.  No active cardiopulmonary disease. Electronically Signed   By: Obie Dredge M.D.   On: 04/03/2018 09:17    Microbiology: Recent Results (from the past 240 hour(s))  Respiratory Panel by PCR     Status: Abnormal   Collection Time: 04/03/18  1:17 PM   Result Value Ref Range Status   Adenovirus NOT DETECTED NOT DETECTED Final   Coronavirus 229E NOT DETECTED NOT DETECTED Final   Coronavirus HKU1 NOT DETECTED NOT DETECTED Final   Coronavirus NL63 NOT DETECTED NOT DETECTED Final   Coronavirus OC43 NOT DETECTED NOT DETECTED Final   Metapneumovirus DETECTED (A) NOT DETECTED Final   Rhinovirus / Enterovirus NOT DETECTED NOT DETECTED Final   Influenza A NOT DETECTED NOT DETECTED Final   Influenza B NOT DETECTED NOT DETECTED Final   Parainfluenza Virus 1 NOT DETECTED NOT  DETECTED Final   Parainfluenza Virus 2 NOT DETECTED NOT DETECTED Final   Parainfluenza Virus 3 NOT DETECTED NOT DETECTED Final   Parainfluenza Virus 4 NOT DETECTED NOT DETECTED Final   Respiratory Syncytial Virus NOT DETECTED NOT DETECTED Final   Bordetella pertussis NOT DETECTED NOT DETECTED Final   Chlamydophila pneumoniae NOT DETECTED NOT DETECTED Final   Mycoplasma pneumoniae NOT DETECTED NOT DETECTED Final  Culture, expectorated sputum-assessment     Status: None   Collection Time: 04/04/18  9:32 PM  Result Value Ref Range Status   Specimen Description SPUTUM  Final   Special Requests NONE  Final   Sputum evaluation   Final    THIS SPECIMEN IS ACCEPTABLE FOR SPUTUM CULTURE Performed at [facility], [address] [place], [place] [address]    Report Status 04/05/2018 FINAL  Final  Culture, respiratory (NON-Expectorated)     Status: None   Collection Time: 04/04/18  9:32 PM  Result Value Ref Range Status   Specimen Description SPUTUM  Final   Special Requests NONE Reflexed from [id number]  Final   Gram Stain   Final    MODERATE WBC PRESENT, PREDOMINANTLY MONONUCLEAR FEW SQUAMOUS EPITHELIAL CELLS PRESENT FEW GRAM POSITIVE COCCI IN PAIRS RARE BUDDING YEAST SEEN    Culture   Final    FEW Consistent with normal respiratory flora. Performed at [facility], [address], [place], [place] [address]    Report Status 04/08/2018 FINAL  Final      Labs: Basic Metabolic Panel: Recent Labs  Lab 04/09/18 1306 04/10/18 1805 04/12/18 0844  NA 143 144 142  K 4.3 4.6 4.1  CL 98* 105 105  CO2 39* 33* 33*  GLUCOSE 113* 134* 105*  BUN 20 18 16   CREATININE 0.62 0.62 0.64  CALCIUM 9.7 9.6 9.0  MG 2.0  --   --    Liver Function Tests: Recent Labs  Lab 04/12/18 0844  AST 17  ALT 18  ALKPHOS 42  BILITOT 0.7  PROT 5.8*  ALBUMIN 3.0*   No results for input(s): LIPASE, AMYLASE in the last 168 hours. No results for input(s): AMMONIA in the last 168 hours. CBC: Recent Labs  Lab 04/09/18 1306 04/11/18 0337 04/12/18 0844  WBC 6.1 16.0* 11.3*  NEUTROABS 3.2 14.0*  --   HGB 12.4 11.2* 11.1*  HCT 40.9 37.3 36.2  MCV 98.8 98.7 98.4  PLT 172 163 167   Cardiac Enzymes: No results for input(s): CKTOTAL, CKMB, CKMBINDEX, TROPONINI in the last 168 hours. BNP: BNP (last 3 results) Recent Labs    04/03/18 0840 04/09/18 1059  BNP 16.4 10.0    ProBNP (last 3 results) No results for input(s): PROBNP in the last 8760 hours.  CBG: Recent Labs  Lab 04/08/18 0758 04/09/18 0748 04/10/18 0734 04/12/18 0721 04/13/18 0739  GLUCAP 80 80 149* 101* 104*       Signed:  Laverna Peace, MD [facility] 04/13/2018, 1:07 PM

## 2018-04-13 NOTE — Care Management Note (Addendum)
Case Management Note  Patient Details  Name: [name] MRN: [id number] Date of Birth: 1966-08-11  Subjective/Objective:    From home, pta indep, will need home oxygen, NCM spoke with patient, offered choices for oxygen, she states she would like to work with [facility] , because her spouse has had them in the past.  Referral given to [facility] with [facility] for home oxygen.  Patient is for dc today per MD.   Also [facility] and her PCP is Vania Rea.  NCM will [facility] fax is [phone].            Action/Plan: DC home with home oxygen.  Expected Discharge Date:                  Expected Discharge Plan:  Home/Self Care  In-House Referral:     Discharge planning Services  CM Consult  Post Acute Care Choice:  Durable Medical Equipment Choice offered to:  Patient  DME Arranged:  Oxygen DME Agency:  [facility]  HH Arranged:    HH Agency:     Status of Service:  Completed, signed off  If discussed at Long Length of Stay Meetings, dates discussed:    Additional Comments:  Leone Haven, RN 04/13/2018, 8:35 AM

## 2018-04-13 NOTE — Progress Notes (Addendum)
..  Patient given all discharge paper work. All pages reviewed and subsequent questions answered. Patients peripheral IV's discontinued without complications. Patients belongings returned. Patient placed in wheel chair and escorted by staff member to pick up location. DME oxygen sent with patient.

## 2018-10-24 ENCOUNTER — Encounter: Payer: Self-pay | Admitting: Emergency Medicine

## 2018-10-24 ENCOUNTER — Other Ambulatory Visit: Payer: Self-pay

## 2018-10-24 ENCOUNTER — Emergency Department
Admission: EM | Admit: 2018-10-24 | Discharge: 2018-10-24 | Disposition: A | Discharge status: Home / Self Care | Payer: Medicare Other | Attending: Emergency Medicine | Admitting: Emergency Medicine

## 2018-10-24 DIAGNOSIS — Z79899 Other long term (current) drug therapy: Secondary | ICD-10-CM | POA: Diagnosis not present

## 2018-10-24 DIAGNOSIS — J449 Chronic obstructive pulmonary disease, unspecified: Secondary | ICD-10-CM | POA: Insufficient documentation

## 2018-10-24 DIAGNOSIS — M79604 Pain in right leg: Secondary | ICD-10-CM | POA: Diagnosis present

## 2018-10-24 DIAGNOSIS — M5431 Sciatica, right side: Secondary | ICD-10-CM | POA: Diagnosis not present

## 2018-10-24 DIAGNOSIS — F17210 Nicotine dependence, cigarettes, uncomplicated: Secondary | ICD-10-CM | POA: Diagnosis not present

## 2018-10-24 MED ORDER — DEXAMETHASONE SODIUM PHOSPHATE 10 MG/ML IJ SOLN
10.0000 mg | Freq: Once | INTRAMUSCULAR | Status: AC
  Administered 2018-10-24: 10 mg via INTRAMUSCULAR
  Filled 2018-10-24: qty 1

## 2018-10-24 MED ORDER — PREDNISONE 10 MG PO TABS: 10.0000 mg | ORAL_TABLET | Freq: Every day | ORAL | 0 refills | Status: DC

## 2018-10-24 MED ORDER — METHOCARBAMOL 500 MG PO TABS: 500.0000 mg | ORAL_TABLET | Freq: Four times a day (QID) | ORAL | 0 refills | Status: DC

## 2018-10-24 NOTE — ED Notes (Signed)
See triage note  Presents with right hip pain  States pain radiates into leg w/o injury

## 2018-10-24 NOTE — ED Triage Notes (Signed)
R hip pain radiating down back of leg began today, denies fall or injury, history of back trouble.

## 2018-10-24 NOTE — ED Provider Notes (Signed)
[facility] Emergency Department Provider Note  ____________________________________________  Time seen: Approximately 2:58 PM  I have reviewed the triage vital signs and the nursing notes.   HISTORY  Chief Complaint Hip Pain    HPI Michele Lucas is a 52 y.o. female who presents the emergency department complaining of pain radiating from the back, through the hip, down the right leg.  Patient reports that she has a significant history of degenerative disc disease in her back and has been told "that I need surgery."  Patient is on chronic pain management therapy.  She reports that last night, she slept in a recliner at her husband's long-term care facility.  Patient reports that she woke up this morning with a "ache" in her lower back.  Throughout the day, this has increased and she is now expensing radicular symptoms on the right lower extremity.  No distinct injury.  Patient denies any bowel or bladder dysfunction, saddle anesthesia or paresthesias.  Patient reports that the pain is not improved with her chronic pain medication.  Patient denies any other complaints to include headache, chest pain or shortness of breath, abdominal pain, nausea or vomiting, dysuria, polyuria, hematuria, diarrhea or constipation.  Patient has a history of anxiety, chronic low back pain, COPD, emphysema, fibromyalgia, neuropathic pain.    Past Medical History:  Diagnosis Date  . Anxiety   . Chronic back pain   . COPD, mild (HCC)   . Emphysema lung (HCC)   . Endometriosis   . Fibromyalgia   . Neuropathic pain   . PTSD (post-traumatic stress disorder)     Patient Active Problem List   Diagnosis Date Noted  . Protein-calorie malnutrition, severe 04/04/2018  . Nicotine abuse 04/04/2018  . Acute respiratory failure with hypoxia (HCC) 04/03/2018  . Acute bronchitis with COPD (HCC) 04/03/2018  . PTSD (post-traumatic stress disorder) 04/03/2018  . Neuropathic pain 04/03/2018  .  Fibromyalgia 04/03/2018  . Chronic back pain 04/03/2018  . Anxiety 04/03/2018  . Thrombocytopenia (HCC) 04/03/2018  . Acute hyponatremia 04/03/2018  . Elevated lactic acid level 04/03/2018  . Protein calorie malnutrition (HCC) 04/03/2018  . Pneumothorax on left 10/06/2016  . Chronic pain syndrome 04/02/2015  . Generalized anxiety disorder 04/02/2015  . Hypokalemia 04/02/2015  . Normocytic anemia 04/02/2015  . Abnormal urinalysis 04/02/2015  . SOB (shortness of breath)   . CAP (community acquired pneumonia) 04/01/2015  . COPD exacerbation (HCC) 04/01/2015    Past Surgical History:  Procedure Laterality Date  . CESAREAN SECTION    . copolscopy    . TONSILLECTOMY    . TUBAL LIGATION      Prior to Admission medications   Medication Sig Start Date End Date Taking? Authorizing Provider  albuterol (PROVENTIL HFA;VENTOLIN HFA) 108 (90 Base) MCG/ACT inhaler Inhale 2 puffs into the lungs 5 (five) times daily as needed for wheezing or shortness of breath.     [provider]  benzonatate (TESSALON) 200 MG capsule Take 1 capsule (200 mg total) by mouth 3 (three) times daily. 04/13/18   Roberto ScalesNettey, Shayla D, MD  buprenorphine-naloxone (SUBOXONE) 2-0.5 mg SUBL SL tablet Place 3 tablets under the tongue daily.    [provider]  chlorhexidine (PERIDEX) 0.12 % solution Use as directed in the mouth or throat 2 (two) times daily as needed (gum swelling).     [provider]  cholecalciferol (VITAMIN D) 1000 units tablet Take 1,000 Units by mouth 2 (two) times daily.    [provider]  feeding  supplement, GLUCERNA SHAKE, (GLUCERNA SHAKE) LIQD Take 237 mLs by mouth 3 (three) times daily between meals. 04/13/18   Laverna Peace, MD  methocarbamol (ROBAXIN) 500 MG tablet Take 1 tablet (500 mg total) by mouth 4 (four) times daily. 10/24/18   , Delorise Royals, PA-C  mometasone-formoterol (DULERA) 200-5 MCG/ACT AERO Inhale 2 puffs into the lungs 2 (two) times daily.  04/13/18   Roberto Scales D, MD  nicotine (NICODERM CQ - DOSED IN MG/24 HOURS) 21 mg/24hr patch Place 1 patch (21 mg total) onto the skin daily. 04/14/18   Laverna Peace, MD  predniSONE (DELTASONE) 10 MG tablet Take 1 tablet (10 mg total) by mouth daily. 10/24/18   , Delorise Royals, PA-C  venlafaxine XR (EFFEXOR-XR) 75 MG 24 hr capsule Take 225 mg by mouth daily with breakfast.    [provider]  vitamin B-12 (CYANOCOBALAMIN) 100 MCG tablet Take 200 mcg by mouth daily.    [provider]    Allergies Guaifenesin & derivatives; Ibuprofen; Neurontin [gabapentin]; Tylenol [acetaminophen]; Ampicillin; and Clindamycin/lincomycin  Family History  Problem Relation Age of Onset  . Diabetes type II Neg Hx   . Stroke Neg Hx     Social History Social History   Tobacco Use  . Smoking status: Current Every Day Smoker    Packs/day: 0.50    Types: Cigarettes  . Smokeless tobacco: Current User  . Tobacco comment: Previously documented in a pack per day-she now reports 4 to 5 cigarettes/day  Substance Use Topics  . Alcohol use: No  . Drug use: No     Review of Systems  Constitutional: No fever/chills Eyes: No visual changes. No discharge ENT: No upper respiratory complaints. Cardiovascular: no chest pain. Respiratory: no cough. No SOB. Gastrointestinal: No abdominal pain.  No nausea, no vomiting.  No diarrhea.  No constipation. Genitourinary: Negative for dysuria. No hematuria Musculoskeletal: Positive for lower back pain radiating down the right leg Skin: Negative for rash, abrasions, lacerations, ecchymosis. Neurological: Negative for headaches, focal weakness or numbness. 10-point ROS otherwise negative.  ____________________________________________   PHYSICAL EXAM:  VITAL SIGNS: ED Triage Vitals  Enc Vitals Group     BP 10/24/18 1446 (!) 118/92     Pulse Rate 10/24/18 1446 (!) 105     Resp 10/24/18 1446 18     Temp 10/24/18 1446 97.9 F (36.6 C)      Temp Source 10/24/18 1446 Oral     SpO2 10/24/18 1446 95 %     Weight 10/24/18 1447 127 lb (57.6 kg)     Height 10/24/18 1447 5\' 2"  (1.575 m)     Head Circumference --      Peak Flow --      Pain Score 10/24/18 1447 10     Pain Loc --      Pain Edu? --      Excl. in GC? --      Constitutional: Alert and oriented. Well appearing and in no acute distress. Eyes: Conjunctivae are normal. PERRL. EOMI. Head: Atraumatic. Neck: No stridor.    Cardiovascular: Normal rate, regular rhythm. Normal S1 and S2.  Good peripheral circulation. Respiratory: Normal respiratory effort without tachypnea or retractions. Lungs CTAB. Good air entry to the bases with no decreased or absent breath sounds. Gastrointestinal: Bowel sounds 4 quadrants. Soft and nontender to palpation. No guarding or rigidity. No palpable masses. No distention. No CVA tenderness. Musculoskeletal: Full range of motion to all extremities. No gross deformities appreciated.  Visualization of the  lumbar spine reveals no visible signs of trauma.  Patient is nontender to palpation midline or left paraspinal muscle region.  Diffusely tender to palpation in the right paraspinal muscle group, over the right-sided sciatic notch.  Negative straight leg raise.  Dorsalis pedis pulse intact bilateral lower extremities.  Sensation intact and equal in all dermatomal distributions bilateral lower extremity. Neurologic:  Normal speech and language. No gross focal neurologic deficits are appreciated.  Skin:  Skin is warm, dry and intact. No rash noted. Psychiatric: Mood and affect are normal. Speech and behavior are normal. Patient exhibits appropriate insight and judgement.   ____________________________________________   LABS (all labs ordered are listed, but only abnormal results are displayed)  Labs Reviewed - No data to  display ____________________________________________  EKG   ____________________________________________  RADIOLOGY   No results found.  ____________________________________________    PROCEDURES  Procedure(s) performed:    Procedures    Medications  dexamethasone (DECADRON) injection 10 mg (10 mg Intramuscular Given 10/24/18 1522)     ____________________________________________   INITIAL IMPRESSION / ASSESSMENT AND PLAN / ED COURSE  Pertinent labs & imaging results that were available during my care of the patient were reviewed by me and considered in my medical decision making (see chart for details).  Review of the [place] CSRS was performed in accordance of the [facility] prior to dispensing any controlled drugs.      Patient's diagnosis is consistent with sciatica.  Patient presents the emergency department complaining of pain radiating down the right leg.  Patient has a history of chronic back pain.  No significant injury.  Patient did sleep in an awkward position in a recliner all night prior to the onset of symptoms.  No indication for labs or imaging at this time.  Patient states that NSAIDs give her significant reflux.  As such, patient will be treated with course of steroid as well as muscle relaxers.  Follow-up with primary care. Patient is given ED precautions to return to the ED for any worsening or new symptoms.     ____________________________________________  FINAL CLINICAL IMPRESSION(S) / ED DIAGNOSES  Final diagnoses:  Sciatica of right side      NEW MEDICATIONS STARTED DURING THIS VISIT:  ED Discharge Orders         Ordered    predniSONE (DELTASONE) 10 MG tablet  Daily    Note to Pharmacy:  Take 6 pills x 2 days, 5 pills x 2 days, 4 pills x 2 days, 3 pills x 2 days, 2 pills x 2 days, and 1 pill x 2 days   10/24/18 1517    methocarbamol (ROBAXIN) 500 MG tablet  4 times daily     10/24/18 1517              This chart was dictated  using voice recognition software/Dragon. Despite best efforts to proofread, errors can occur which can change the meaning. Any change was purely unintentional.    Racheal Patches, PA-C 10/24/18 1530    Emily Filbert, MD 10/24/18 [id number]

## 2019-01-28 ENCOUNTER — Emergency Department (HOSPITAL_COMMUNITY)
Admission: EM | Admit: 2019-01-28 | Discharge: 2019-01-28 | Disposition: A | Discharge status: Home / Self Care | Payer: Medicare Other | Attending: Emergency Medicine | Admitting: Emergency Medicine

## 2019-01-28 ENCOUNTER — Other Ambulatory Visit: Payer: Self-pay

## 2019-01-28 ENCOUNTER — Encounter (HOSPITAL_COMMUNITY): Payer: Self-pay | Admitting: Emergency Medicine

## 2019-01-28 DIAGNOSIS — Z79899 Other long term (current) drug therapy: Secondary | ICD-10-CM | POA: Insufficient documentation

## 2019-01-28 DIAGNOSIS — F17210 Nicotine dependence, cigarettes, uncomplicated: Secondary | ICD-10-CM | POA: Diagnosis not present

## 2019-01-28 DIAGNOSIS — J449 Chronic obstructive pulmonary disease, unspecified: Secondary | ICD-10-CM | POA: Insufficient documentation

## 2019-01-28 DIAGNOSIS — Z79891 Long term (current) use of opiate analgesic: Secondary | ICD-10-CM | POA: Insufficient documentation

## 2019-01-28 DIAGNOSIS — H5712 Ocular pain, left eye: Secondary | ICD-10-CM | POA: Diagnosis present

## 2019-01-28 DIAGNOSIS — H15002 Unspecified scleritis, left eye: Secondary | ICD-10-CM | POA: Diagnosis not present

## 2019-01-28 LAB — BASIC METABOLIC PANEL
Anion gap: 7 (ref 5–15)
BUN: 9 mg/dL (ref 6–20)
CALCIUM: 9.7 mg/dL (ref 8.9–10.3)
CO2: 27 mmol/L (ref 22–32)
Chloride: 103 mmol/L (ref 98–111)
Creatinine, Ser: 0.72 mg/dL (ref 0.44–1.00)
GFR calc Af Amer: >60 mL/min (ref >60)
GFR calc non Af Amer: >60 mL/min (ref >60)
Glucose, Bld: 87 mg/dL (ref 70–99)
Potassium: 3.7 mmol/L (ref 3.5–5.1)
Sodium: 137 mmol/L (ref 135–145)

## 2019-01-28 LAB — CBC
HCT: 44.8 % (ref 36.0–46.0)
Hemoglobin: 14 g/dL (ref 12.0–15.0)
MCH: 31.1 pg (ref 26.0–34.0)
MCHC: 31.3 g/dL (ref 30.0–36.0)
MCV: 99.6 fL (ref 80.0–100.0)
Platelets: 128 10*3/uL — ABNORMAL LOW (ref 150–400)
RBC: 4.5 MIL/uL (ref 3.87–5.11)
RDW: 12.7 % (ref 11.5–15.5)
WBC: 7.2 10*3/uL (ref 4.0–10.5)
nRBC: 0 % (ref 0.0–0.2)

## 2019-01-28 MED ORDER — TETRACAINE HCL 0.5 % OP SOLN
2.0000 [drp] | Freq: Once | OPHTHALMIC | Status: AC
  Administered 2019-01-28: 2 [drp] via OPHTHALMIC
  Filled 2019-01-28: qty 4

## 2019-01-28 MED ORDER — MORPHINE SULFATE (PF) 4 MG/ML IV SOLN
4.0000 mg | Freq: Once | INTRAVENOUS | Status: AC
  Administered 2019-01-28: 4 mg via INTRAVENOUS
  Filled 2019-01-28: qty 1

## 2019-01-28 MED ORDER — IBUPROFEN 400 MG PO TABS
600.0000 mg | ORAL_TABLET | Freq: Once | ORAL | Status: AC
  Administered 2019-01-28: 600 mg via ORAL
  Filled 2019-01-28: qty 1

## 2019-01-28 MED ORDER — FLUORESCEIN SODIUM 1 MG OP STRP
1.0000 | ORAL_STRIP | Freq: Once | OPHTHALMIC | Status: AC
  Administered 2019-01-28: 1 via OPHTHALMIC
  Filled 2019-01-28: qty 1

## 2019-01-28 MED ORDER — ONDANSETRON 8 MG PO TBDP: 8.0000 mg | ORAL_TABLET | Freq: Three times a day (TID) | ORAL | 0 refills | Status: DC | PRN

## 2019-01-28 MED ORDER — IBUPROFEN 600 MG PO TABS: 600.0000 mg | ORAL_TABLET | Freq: Three times a day (TID) | ORAL | 0 refills | Status: AC

## 2019-01-28 MED ORDER — FLUORESCEIN SODIUM 1 MG OP STRP: 1.0000 | ORAL_STRIP | Freq: Once | OPHTHALMIC | Status: DC

## 2019-01-28 MED ORDER — ONDANSETRON HCL 4 MG/2ML IJ SOLN
4.0000 mg | Freq: Once | INTRAMUSCULAR | Status: AC
  Administered 2019-01-28: 4 mg via INTRAVENOUS
  Filled 2019-01-28: qty 2

## 2019-01-28 MED ORDER — TETRACAINE HCL 0.5 % OP SOLN: 2.0000 [drp] | Freq: Once | OPHTHALMIC | Status: DC

## 2019-01-28 NOTE — Discharge Instructions (Addendum)
The medications as prescribed, follow-up with Dr. Robin Searing as he instructed

## 2019-01-28 NOTE — ED Notes (Signed)
Pt talking on phone with family. Eye pain looks to be better.

## 2019-01-28 NOTE — ED Triage Notes (Signed)
Pt states her left eye started hurting this morning around 4am and has continued to hurt. Pt states she has not scratched her eye or got anything in it that shes knows of. Pt states she feels like her eye is gonna fall out and it hurts with movement.

## 2019-01-28 NOTE — ED Notes (Signed)
Ophthalmology at bedside

## 2019-01-28 NOTE — ED Notes (Signed)
Patient verbalizes understanding of discharge instructions. Opportunity for questioning and answers were provided. Armband removed by staff, pt discharged from ED in wheelchair.  

## 2019-01-28 NOTE — ED Provider Notes (Signed)
[facility] EMERGENCY DEPARTMENT Provider Note   CSN: [id number] Arrival date & time: 01/28/19  2012    History   Chief Complaint Chief Complaint  Patient presents with  . Eye Injury    HPI Michele Lucas is a 53 y.o. female.     HPI Patient presents to the emergency room with complaints of acute left-sided eye pain.  She states when she woke up this morning she noticed her left eye was a little bit red.  Patient states she was driving this evening when she started having increasing pain in the left side of her eye.  The pain has been increasing in intensity and is now very severe.  Denies any recent injuries or falls.  She denies any history of prior visual issues. Past Medical History:  Diagnosis Date  . Anxiety   . Chronic back pain   . COPD, mild (HCC)   . Emphysema lung (HCC)   . Endometriosis   . Fibromyalgia   . Neuropathic pain   . PTSD (post-traumatic stress disorder)     Patient Active Problem List   Diagnosis Date Noted  . Protein-calorie malnutrition, severe 04/04/2018  . Nicotine abuse 04/04/2018  . Acute respiratory failure with hypoxia (HCC) 04/03/2018  . Acute bronchitis with COPD (HCC) 04/03/2018  . PTSD (post-traumatic stress disorder) 04/03/2018  . Neuropathic pain 04/03/2018  . Fibromyalgia 04/03/2018  . Chronic back pain 04/03/2018  . Anxiety 04/03/2018  . Thrombocytopenia (HCC) 04/03/2018  . Acute hyponatremia 04/03/2018  . Elevated lactic acid level 04/03/2018  . Protein calorie malnutrition (HCC) 04/03/2018  . Pneumothorax on left 10/06/2016  . Chronic pain syndrome 04/02/2015  . Generalized anxiety disorder 04/02/2015  . Hypokalemia 04/02/2015  . Normocytic anemia 04/02/2015  . Abnormal urinalysis 04/02/2015  . SOB (shortness of breath)   . CAP (community acquired pneumonia) 04/01/2015  . COPD exacerbation (HCC) 04/01/2015    Past Surgical History:  Procedure Laterality Date  . CESAREAN SECTION    . copolscopy      . TONSILLECTOMY    . TUBAL LIGATION       OB History   No obstetric history on file.      Home Medications    Prior to Admission medications   Medication Sig Start Date End Date Taking? Authorizing Provider  albuterol (PROVENTIL HFA;VENTOLIN HFA) 108 (90 Base) MCG/ACT inhaler Inhale 2 puffs into the lungs 5 (five) times daily as needed for wheezing or shortness of breath.     [provider]  benzonatate (TESSALON) 200 MG capsule Take 1 capsule (200 mg total) by mouth 3 (three) times daily. 04/13/18   Roberto Scales D, MD  buprenorphine-naloxone (SUBOXONE) 2-0.5 mg SUBL SL tablet Place 3 tablets under the tongue daily.    [provider]  chlorhexidine (PERIDEX) 0.12 % solution Use as directed in the mouth or throat 2 (two) times daily as needed (gum swelling).     [provider]  cholecalciferol (VITAMIN D) 1000 units tablet Take 1,000 Units by mouth 2 (two) times daily.    [provider]  feeding supplement, GLUCERNA SHAKE, (GLUCERNA SHAKE) LIQD Take 237 mLs by mouth 3 (three) times daily between meals. 04/13/18   Laverna Peace, MD  ibuprofen (ADVIL,MOTRIN) 600 MG tablet Take 1 tablet (600 mg total) by mouth every 8 (eight) hours for 7 days. 01/28/19 02/04/19  Linwood Dibbles, MD  methocarbamol (ROBAXIN) 500 MG tablet Take 1 tablet (500 mg total) by mouth 4 (four) times daily.  10/24/18   Cuthriell, Delorise RoyalsJonathan D, PA-C  mometasone-formoterol (DULERA) 200-5 MCG/ACT AERO Inhale 2 puffs into the lungs 2 (two) times daily. 04/13/18   Roberto ScalesNettey, Shayla D, MD  nicotine (NICODERM CQ - DOSED IN MG/24 HOURS) 21 mg/24hr patch Place 1 patch (21 mg total) onto the skin daily. 04/14/18   Laverna PeaceNettey, Shayla D, MD  ondansetron (ZOFRAN ODT) 8 MG disintegrating tablet Take 1 tablet (8 mg total) by mouth every 8 (eight) hours as needed for nausea or vomiting. 01/28/19   Linwood DibblesKnapp, Mellie Buccellato, MD  predniSONE (DELTASONE) 10 MG tablet Take 1 tablet (10 mg total) by mouth daily. 10/24/18   Cuthriell,  Delorise RoyalsJonathan D, PA-C  venlafaxine XR (EFFEXOR-XR) 75 MG 24 hr capsule Take 225 mg by mouth daily with breakfast.    [provider]  vitamin B-12 (CYANOCOBALAMIN) 100 MCG tablet Take 200 mcg by mouth daily.    [provider]    Family History Family History  Problem Relation Age of Onset  . Diabetes type II Neg Hx   . Stroke Neg Hx     Social History Social History   Tobacco Use  . Smoking status: Current Every Day Smoker    Packs/day: 0.50    Types: Cigarettes  . Smokeless tobacco: Current User  . Tobacco comment: Previously documented in a pack per day-she now reports 4 to 5 cigarettes/day  Substance Use Topics  . Alcohol use: No  . Drug use: No     Allergies   Guaifenesin & derivatives; Ibuprofen; Neurontin [gabapentin]; Tylenol [acetaminophen]; Ampicillin; and Clindamycin/lincomycin   Review of Systems Review of Systems   Physical Exam Updated Vital Signs BP 120/65   Pulse 83   Temp 98.7 F (37.1 C) (Oral)   Resp 16   SpO2 93%   Physical Exam Vitals signs and nursing note reviewed.  Constitutional:      General: She is in acute distress.     Appearance: She is well-developed.  HENT:     Head: Normocephalic and atraumatic.     Right Ear: External ear normal.     Left Ear: External ear normal.  Eyes:     General: No scleral icterus.       Right eye: No discharge.        Left eye: No discharge.     Comments: Pupils 2 mm bilaterally, conjunctival injection left eye, no purulent drainage; ocular pressures are 22 on the right and 38 on the left  Neck:     Musculoskeletal: Neck supple.     Trachea: No tracheal deviation.  Cardiovascular:     Rate and Rhythm: Normal rate and regular rhythm.  Pulmonary:     Effort: Pulmonary effort is normal. No respiratory distress.     Breath sounds: Normal breath sounds. No stridor. No wheezing or rales.  Abdominal:     General: Bowel sounds are normal. There is no distension.     Palpations: Abdomen is  soft.     Tenderness: There is no abdominal tenderness. There is no guarding or rebound.  Musculoskeletal:        General: No tenderness.  Skin:    General: Skin is warm and dry.     Findings: No rash.  Neurological:     Mental Status: She is alert.     Cranial Nerves: No cranial nerve deficit (no facial droop, extraocular movements intact, no slurred speech).     Sensory: No sensory deficit.     Motor: No abnormal muscle tone or  seizure activity.     Coordination: Coordination normal.      ED Treatments / Results  Labs (all labs ordered are listed, but only abnormal results are displayed) Labs Reviewed  CBC - Abnormal; Notable for the following components:      Result Value   Platelets 128 (*)    All other components within normal limits  BASIC METABOLIC PANEL     Procedures Procedures (including critical care time)  Medications Ordered in ED Medications  fluorescein ophthalmic strip 1 strip (1 strip Left Eye Given by Other 01/28/19 2053)  tetracaine (PONTOCAINE) 0.5 % ophthalmic solution 2 drop (2 drops Left Eye Given by Other 01/28/19 2053)  morphine 4 MG/ML injection 4 mg (4 mg Intravenous Given 01/28/19 2106)     Initial Impression / Assessment and Plan / ED Course  I have reviewed the triage vital signs and the nursing notes.  Pertinent labs & imaging results that were available during my care of the patient were reviewed by me and considered in my medical decision making (see chart for details).  Clinical Course as of Jan 28 2223  Thu Jan 28, 2019  2040 Ocular pressures care concerning.  Will consult ophtho   [JK]  2053 D/w Dr Robin Searing.  He will come to evaluate the patient in the ED   [JK]    Clinical Course User Index [JK] Linwood Dibbles, MD     Symptoms were concerning for the possibility of acute glaucoma with her elevated ocular pressure.  Dr. Robin Searing evaluated the patient in the ED, appreciate his assitance.  On his repeat exam the patient's eye  pressures had decreased.  Patient did have significant discomfort and it was hard to get her to relax initially.  This may have falsely elevated her initial ocular pressure.  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  Plan on discharge home with prescription for ibuprofen and Zofran.  Patient states she cannot take ibuprofen and just causes nausea for her.  Final Clinical Impressions(s) / ED Diagnoses   Final diagnoses:  Scleritis of left eye    ED Discharge Orders         Ordered    ibuprofen (ADVIL,MOTRIN) 600 MG tablet  Every 8 hours     01/28/19 2222    ondansetron (ZOFRAN ODT) 8 MG disintegrating tablet  Every 8 hours PRN     01/28/19 2222           Linwood Dibbles, MD 01/28/19 2225

## 2019-01-28 NOTE — ED Notes (Signed)
Per MD order visual acuity screening performed as follows: Lt eye 10/25; rt eye 10/20, Bilateral 10/25. MD advised. [employer]

## 2019-01-29 ENCOUNTER — Other Ambulatory Visit: Payer: Self-pay | Admitting: Ophthalmology

## 2019-01-29 NOTE — Progress Notes (Unsigned)
CC: Pt has throbbing eye pain (left) HPI: 53 yo AAF comes in to ER c/o throbbing eye pain OS which started this morning.  Pt states her vision isn't blurry, denies photophobia. Pt states pain is worse with eye movements.   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  Will recommend starting ibuprofen 600mg  QID with follow up tomorrow at [facility] with Dr. Robin Searing tomorrow 01/29/19

## 2019-03-04 ENCOUNTER — Emergency Department (HOSPITAL_COMMUNITY): Payer: Medicare Other

## 2019-03-04 ENCOUNTER — Other Ambulatory Visit: Payer: Self-pay

## 2019-03-04 ENCOUNTER — Encounter (HOSPITAL_COMMUNITY): Payer: Self-pay | Admitting: *Deleted

## 2019-03-04 ENCOUNTER — Emergency Department (HOSPITAL_COMMUNITY)
Admission: EM | Admit: 2019-03-04 | Discharge: 2019-03-04 | Disposition: A | Discharge status: Home / Self Care | Payer: Medicare Other | Attending: Emergency Medicine | Admitting: Emergency Medicine

## 2019-03-04 DIAGNOSIS — J189 Pneumonia, unspecified organism: Secondary | ICD-10-CM | POA: Insufficient documentation

## 2019-03-04 DIAGNOSIS — J181 Lobar pneumonia, unspecified organism: Secondary | ICD-10-CM

## 2019-03-04 DIAGNOSIS — F17210 Nicotine dependence, cigarettes, uncomplicated: Secondary | ICD-10-CM | POA: Diagnosis not present

## 2019-03-04 DIAGNOSIS — J449 Chronic obstructive pulmonary disease, unspecified: Secondary | ICD-10-CM | POA: Insufficient documentation

## 2019-03-04 DIAGNOSIS — R079 Chest pain, unspecified: Secondary | ICD-10-CM | POA: Diagnosis present

## 2019-03-04 LAB — CBC WITH DIFFERENTIAL/PLATELET
Abs Immature Granulocytes: 0.06 10*3/uL (ref 0.00–0.07)
Basophils Absolute: 0 10*3/uL (ref 0.0–0.1)
Basophils Relative: 0 %
Eosinophils Absolute: 0 10*3/uL (ref 0.0–0.5)
Eosinophils Relative: 0 %
HCT: 43.3 % (ref 36.0–46.0)
Hemoglobin: 13.5 g/dL (ref 12.0–15.0)
Immature Granulocytes: 0 %
Lymphocytes Relative: 8 %
Lymphs Abs: 1.3 10*3/uL (ref 0.7–4.0)
MCH: 31.5 pg (ref 26.0–34.0)
MCHC: 31.2 g/dL (ref 30.0–36.0)
MCV: 100.9 fL — ABNORMAL HIGH (ref 80.0–100.0)
Monocytes Absolute: 1.6 10*3/uL — ABNORMAL HIGH (ref 0.1–1.0)
Monocytes Relative: 10 %
Neutro Abs: 12.8 10*3/uL — ABNORMAL HIGH (ref 1.7–7.7)
Neutrophils Relative %: 82 %
Platelets: 112 10*3/uL — ABNORMAL LOW (ref 150–400)
RBC: 4.29 MIL/uL (ref 3.87–5.11)
RDW: 12.8 % (ref 11.5–15.5)
WBC: 15.8 10*3/uL — ABNORMAL HIGH (ref 4.0–10.5)
nRBC: 0 % (ref 0.0–0.2)

## 2019-03-04 LAB — LACTIC ACID, PLASMA: Lactic Acid, Venous: 1 mmol/L (ref 0.5–1.9)

## 2019-03-04 LAB — COMPREHENSIVE METABOLIC PANEL
ALT: 14 U/L (ref 0–44)
AST: 23 U/L (ref 15–41)
Albumin: 4.2 g/dL (ref 3.5–5.0)
Alkaline Phosphatase: 52 U/L (ref 38–126)
Anion gap: 7 (ref 5–15)
BUN: 10 mg/dL (ref 6–20)
CO2: 26 mmol/L (ref 22–32)
Calcium: 9.2 mg/dL (ref 8.9–10.3)
Chloride: 102 mmol/L (ref 98–111)
Creatinine, Ser: 0.81 mg/dL (ref 0.44–1.00)
GFR calc Af Amer: >60 mL/min (ref >60)
GFR calc non Af Amer: >60 mL/min (ref >60)
Glucose, Bld: 99 mg/dL (ref 70–99)
Potassium: 3.6 mmol/L (ref 3.5–5.1)
Sodium: 135 mmol/L (ref 135–145)
Total Bilirubin: 1.5 mg/dL — ABNORMAL HIGH (ref 0.3–1.2)
Total Protein: 7.4 g/dL (ref 6.5–8.1)

## 2019-03-04 LAB — URINALYSIS, ROUTINE W REFLEX MICROSCOPIC
Bacteria, UA: NONE SEEN
Bilirubin Urine: NEGATIVE
GLUCOSE, UA: NEGATIVE mg/dL
Ketones, ur: NEGATIVE mg/dL
Leukocytes,Ua: NEGATIVE
Nitrite: NEGATIVE
Protein, ur: NEGATIVE mg/dL
Specific Gravity, Urine: 1.02 (ref 1.005–1.030)
pH: 5 (ref 5.0–8.0)

## 2019-03-04 LAB — INFLUENZA PANEL BY PCR (TYPE A & B)
Influenza A By PCR: NEGATIVE
Influenza B By PCR: NEGATIVE

## 2019-03-04 LAB — LACTATE DEHYDROGENASE: LDH: 187 U/L (ref 98–192)

## 2019-03-04 LAB — I-STAT TROPONIN, ED: Troponin i, poc: 0 ng/mL (ref 0.00–0.08)

## 2019-03-04 MED ORDER — SODIUM CHLORIDE 0.9 % IV SOLN: 2.0000 g | Freq: Once | INTRAVENOUS | Status: DC

## 2019-03-04 MED ORDER — LEVOFLOXACIN IN D5W 750 MG/150ML IV SOLN
750.0000 mg | Freq: Once | INTRAVENOUS | Status: AC
  Administered 2019-03-04: 750 mg via INTRAVENOUS
  Filled 2019-03-04: qty 150

## 2019-03-04 MED ORDER — LEVOFLOXACIN 500 MG PO TABS: 500.0000 mg | ORAL_TABLET | Freq: Every day | ORAL | 0 refills | Status: DC

## 2019-03-04 MED ORDER — HYDROCODONE-ACETAMINOPHEN 5-325 MG PO TABS
1.0000 | ORAL_TABLET | Freq: Once | ORAL | Status: AC
  Administered 2019-03-04: 1 via ORAL
  Filled 2019-03-04: qty 1

## 2019-03-04 MED ORDER — SODIUM CHLORIDE 0.9 % IV SOLN: 500.0000 mg | Freq: Once | INTRAVENOUS | Status: DC

## 2019-03-04 MED ORDER — SODIUM CHLORIDE 0.9% FLUSH
3.0000 mL | Freq: Once | INTRAVENOUS | Status: AC
  Administered 2019-03-04: 3 mL via INTRAVENOUS

## 2019-03-04 MED ORDER — KETOROLAC TROMETHAMINE 15 MG/ML IJ SOLN
15.0000 mg | Freq: Once | INTRAMUSCULAR | Status: AC
  Administered 2019-03-04: 15 mg via INTRAVENOUS
  Filled 2019-03-04: qty 1

## 2019-03-04 NOTE — ED Triage Notes (Signed)
Pt c/o right side c/p x 3 days with cough that produces white to yellow sputum.  Pt has not called primary is [facility].

## 2019-03-04 NOTE — ED Notes (Signed)
Attempted to obtain labs without success.  Pt stated "I hate needles" and began yelling.

## 2019-03-04 NOTE — ED Notes (Signed)
Patient ambulatory to the restroom at this time.

## 2019-03-04 NOTE — ED Provider Notes (Addendum)
[facility]-EMERGENCY DEPT Provider Note   CSN: [id number] Arrival date & time: 03/04/19  0441    History   Chief Complaint Chief Complaint  Patient presents with  . Chest Pain    right    HPI Michele Lucas is a 53 y.o. female.     HPI  53 year old female with history of COPD and emphysema comes in a chief complaint of chest pain.  Patient reports that she has been having right-sided pleuritic chest pain for the last 2-3 days.  She has had a cough slightly longer than that which is producing white to yellow phlegm.  Patient denies any known fevers.  Patient's chest pain on the right side is worse with deep inspiration.  She states that she has had pneumothorax on the left side.   Patient states that all of her grandkids are sick with URI-like symptoms.  She denies any other sick contacts, particularly no known exposures with anyone with confirmed COVID-19 infection or travel history to high risk areas.  Pt has no hx of PE, DVT and denies any exogenous hormone (testosterone / estrogen) use, long distance travels or surgery in the past 6 weeks, active cancer, recent immobilization.   Past Medical History:  Diagnosis Date  . Anxiety   . Chronic back pain   . COPD, mild (HCC)   . Emphysema lung (HCC)   . Endometriosis   . Fibromyalgia   . Neuropathic pain   . PTSD (post-traumatic stress disorder)     Patient Active Problem List   Diagnosis Date Noted  . Protein-calorie malnutrition, severe 04/04/2018  . Nicotine abuse 04/04/2018  . Acute respiratory failure with hypoxia (HCC) 04/03/2018  . Acute bronchitis with COPD (HCC) 04/03/2018  . PTSD (post-traumatic stress disorder) 04/03/2018  . Neuropathic pain 04/03/2018  . Fibromyalgia 04/03/2018  . Chronic back pain 04/03/2018  . Anxiety 04/03/2018  . Thrombocytopenia (HCC) 04/03/2018  . Acute hyponatremia 04/03/2018  . Elevated lactic acid level 04/03/2018  . Protein calorie malnutrition (HCC)  04/03/2018  . Pneumothorax on left 10/06/2016  . Chronic pain syndrome 04/02/2015  . Generalized anxiety disorder 04/02/2015  . Hypokalemia 04/02/2015  . Normocytic anemia 04/02/2015  . Abnormal urinalysis 04/02/2015  . SOB (shortness of breath)   . CAP (community acquired pneumonia) 04/01/2015  . COPD exacerbation (HCC) 04/01/2015    Past Surgical History:  Procedure Laterality Date  . CESAREAN SECTION    . copolscopy    . TONSILLECTOMY    . TUBAL LIGATION       OB History   No obstetric history on file.      Home Medications    Prior to Admission medications   Medication Sig Start Date End Date Taking? Authorizing Provider  albuterol (PROVENTIL HFA;VENTOLIN HFA) 108 (90 Base) MCG/ACT inhaler Inhale 2 puffs into the lungs 5 (five) times daily as needed for wheezing or shortness of breath.    Yes [provider]  buprenorphine-naloxone (SUBOXONE) 8-2 mg SUBL SL tablet Place 1 tablet under the tongue daily.   Yes [provider]  mirtazapine (REMERON) 30 MG tablet Take 30 mg by mouth at bedtime.   Yes [provider]  mometasone-formoterol (DULERA) 200-5 MCG/ACT AERO Inhale 2 puffs into the lungs 2 (two) times daily. 04/13/18  Yes Roberto Scales D, MD  benzonatate (TESSALON) 200 MG capsule Take 1 capsule (200 mg total) by mouth 3 (three) times daily. Patient not taking: Reported on 03/04/2019 04/13/18   Laverna Peace, MD  feeding supplement, GLUCERNA SHAKE, (GLUCERNA SHAKE) LIQD Take 237 mLs by mouth 3 (three) times daily between meals. Patient not taking: Reported on 03/04/2019 04/13/18   Roberto Scales D, MD  methocarbamol (ROBAXIN) 500 MG tablet Take 1 tablet (500 mg total) by mouth 4 (four) times daily. Patient not taking: Reported on 03/04/2019 10/24/18   Cuthriell, Delorise Royals, PA-C  nicotine (NICODERM CQ - DOSED IN MG/24 HOURS) 21 mg/24hr patch Place 1 patch (21 mg total) onto the skin daily. Patient not taking: Reported on 03/04/2019 04/14/18   Roberto Scales D, MD  ondansetron (ZOFRAN ODT) 8 MG disintegrating tablet Take 1 tablet (8 mg total) by mouth every 8 (eight) hours as needed for nausea or vomiting. Patient not taking: Reported on 03/04/2019 01/28/19   Linwood Dibbles, MD  predniSONE (DELTASONE) 10 MG tablet Take 1 tablet (10 mg total) by mouth daily. Patient not taking: Reported on 03/04/2019 10/24/18   Cuthriell, Delorise Royals, PA-C    Family History Family History  Problem Relation Age of Onset  . Diabetes type II Neg Hx   . Stroke Neg Hx     Social History Social History   Tobacco Use  . Smoking status: Current Every Day Smoker    Packs/day: 0.50    Types: Cigarettes  . Smokeless tobacco: Current User  . Tobacco comment: Previously documented in a pack per day-she now reports 4 to 5 cigarettes/day  Substance Use Topics  . Alcohol use: No  . Drug use: No     Allergies   Guaifenesin & derivatives; Ibuprofen; Neurontin [gabapentin]; Tylenol [acetaminophen]; Ampicillin; and Clindamycin/lincomycin   Review of Systems Review of Systems  Constitutional: Positive for activity change.  Respiratory: Positive for shortness of breath. Negative for wheezing.   Cardiovascular: Positive for chest pain.  Gastrointestinal: Negative for nausea and vomiting.  Allergic/Immunologic: Negative for immunocompromised state.  Neurological: Positive for weakness.  All other systems reviewed and are negative.    Physical Exam Updated Vital Signs BP 115/68   Pulse (!) 105   Temp 100.1 F (37.8 C) (Oral)   Resp 17   Ht  (1.575 m)   Wt 54.4 kg   LMP  (Approximate)   SpO2 94%   BMI 21.95 kg/m   Physical Exam Vitals signs and nursing note reviewed.  Constitutional:      Appearance: She is well-developed.  HENT:     Head: Normocephalic and atraumatic.  Neck:     Musculoskeletal: Normal range of motion and neck supple.  Cardiovascular:     Rate and Rhythm: Tachycardia present.  Pulmonary:     Effort: Tachypnea present.      Breath sounds: No decreased breath sounds, wheezing, rhonchi or rales.  Abdominal:     General: Bowel sounds are normal.  Musculoskeletal:     Right lower leg: She exhibits no tenderness. No edema.     Left lower leg: She exhibits no tenderness. No edema.  Skin:    General: Skin is warm and dry.  Neurological:     Mental Status: She is alert and oriented to person, place, and time.      ED Treatments / Results  Labs (all labs ordered are listed, but only abnormal results are displayed) Labs Reviewed  COMPREHENSIVE METABOLIC PANEL - Abnormal; Notable for the following components:      Result Value   Total Bilirubin 1.5 (*)    All other components within normal limits  CBC WITH DIFFERENTIAL/PLATELET - Abnormal; Notable for the following  components:   WBC 15.8 (*)    MCV 100.9 (*)    Platelets 112 (*)    Neutro Abs 12.8 (*)    Monocytes Absolute 1.6 (*)    All other components within normal limits  URINALYSIS, ROUTINE W REFLEX MICROSCOPIC - Abnormal; Notable for the following components:   Color, Urine AMBER (*)    APPearance HAZY (*)    Hgb urine dipstick SMALL (*)    All other components within normal limits  CULTURE, BLOOD (ROUTINE X 2)  CULTURE, BLOOD (ROUTINE X 2)  LACTIC ACID, PLASMA  LACTATE DEHYDROGENASE  INFLUENZA PANEL BY PCR (TYPE A & B)  LACTIC ACID, PLASMA  I-STAT TROPONIN, ED    EKG EKG Interpretation  Date/Time:  Thursday March 04 2019 04:59:26 EDT Ventricular Rate:  130 PR Interval:    QRS Duration: 72 QT Interval:  279 QTC Calculation: 411 R Axis:   75 Text Interpretation:  Sinus tachycardia LAE, consider biatrial enlargement LVH with secondary repolarization abnormality No acute changes No significant change since last tracing Confirmed by Derwood Kaplan [phone]) on 03/04/2019 5:05:24 AM Also confirmed by Derwood Kaplan [phone]), editor Barbette Hair [phone])  on 03/04/2019 7:08:18 AM   Radiology Dg Chest 2 View  Result Date: 03/04/2019 CLINICAL  DATA:  Sore throat with cough and congestion EXAM: CHEST - 2 VIEW COMPARISON:  04/09/2018 FINDINGS: Right upper lobe airspace disease. Background hyperinflation and interstitial coarsening. Normal heart size. IMPRESSION: 1. Right upper lobe pneumonia. Followup PA and lateral chest X-ray is recommended in 3-4 weeks following trial of antibiotic therapy to ensure resolution and exclude underlying malignancy. 2.  COPD. Electronically Signed   By: Marnee Spring M.D.   On: 03/04/2019 06:50    Procedures Procedures (including critical care time)  Medications Ordered in ED Medications  levofloxacin (LEVAQUIN) IVPB 750 mg (750 mg Intravenous New Bag/Given 03/04/19 0701)  ketorolac (TORADOL) 15 MG/ML injection 15 mg (has no administration in time range)  sodium chloride flush (NS) 0.9 % injection 3 mL (3 mLs Intravenous Given 03/04/19 0554)  HYDROcodone-acetaminophen (NORCO/VICODIN) 5-325 MG per tablet 1 tablet (1 tablet Oral Given 03/04/19 5631)     Initial Impression / Assessment and Plan / ED Course  I have reviewed the triage vital signs and the nursing notes.  Pertinent labs & imaging results that were available during my care of the patient were reviewed by me and considered in my medical decision making (see chart for details).  Clinical Course as of Mar 03 726  Thu Mar 04, 2019  0726 Patient's heart rate is improved to low 100s. X-ray confirms that she has a pneumonia. She will receive IV Levaquin.  Patient's curb 65 score is 0 and her PSI score is 54 -both of them indicating that outpatient management is okay.  Patient is actually preferring to go home. We will give her Levaquin IV and monitor her little bit longer in the ER.  As long as she is not hypoxic or in respiratory distress she can go home.  We discussed strict ER return precautions and patient will return to the ER if she starts having shortness of breath.   [AN]    Clinical Course User Index [AN] Derwood Kaplan, MD        53 year old female comes in a chief complaint of chest pain.  She is having right-sided pleuritic chest pain along with some shortness of breath and cough.  Cough is productive with clear to yellow phlegm.  She is noted to  have a fever.  Patient has known history of COPD with emphysema.  At the moment she is tachycardic and tachypneic.  She is having some flulike symptoms and has had exposures to grandkids with URI.  She is not wheezing on my exam.  No focal abnormalities on lung exam.  Concerns are that she might be having a flu.  Chest x-ray ordered to rule out pneumonia or pneumothorax.  Patient does not have any high risk exposure for COVID-19, but given her symptoms, if her influenza, chest x-ray are negative then she might need to be screened for it.  Final Clinical Impressions(s) / ED Diagnoses   Final diagnoses:  Community acquired pneumonia of right upper lobe of lung [facility])    ED Discharge Orders    None       Derwood Kaplan, MD 03/04/19 6045    Derwood Kaplan, MD 03/04/19 [phone]

## 2019-03-04 NOTE — ED Notes (Signed)
Pt's granddaughter picked up picked up by her mother.

## 2019-03-04 NOTE — ED Notes (Signed)
Pt with 11 granddaughter.  Informed of current visitor rules & requested pt call someone for pick up of granddaughter.  Pt is currently making attempts to locate someone.

## 2019-03-04 NOTE — ED Notes (Signed)
Patient transported to X-ray 

## 2019-03-04 NOTE — Progress Notes (Signed)
A consult was received from an ED physician for levaquin per pharmacy dosing.  The patient's profile has been reviewed for ht/wt/allergies/indication/available labs.    A one time order has been placed for levaquin 750 mg IV x1.  Further antibiotics/pharmacy consults should be ordered by admitting physician if indicated.                       Thank you, Lucia Gaskins 03/04/2019  7:06 AM

## 2019-03-09 LAB — CULTURE, BLOOD (ROUTINE X 2)
Culture: NO GROWTH
SPECIAL REQUESTS: ADEQUATE

## 2019-04-03 ENCOUNTER — Emergency Department (HOSPITAL_COMMUNITY)
Admission: EM | Admit: 2019-04-03 | Discharge: 2019-04-03 | Disposition: A | Discharge status: Home / Self Care | Payer: Medicare Other | Attending: Emergency Medicine | Admitting: Emergency Medicine

## 2019-04-03 ENCOUNTER — Other Ambulatory Visit: Payer: Self-pay

## 2019-04-03 DIAGNOSIS — Z79899 Other long term (current) drug therapy: Secondary | ICD-10-CM | POA: Diagnosis not present

## 2019-04-03 DIAGNOSIS — J449 Chronic obstructive pulmonary disease, unspecified: Secondary | ICD-10-CM | POA: Insufficient documentation

## 2019-04-03 DIAGNOSIS — F17210 Nicotine dependence, cigarettes, uncomplicated: Secondary | ICD-10-CM | POA: Insufficient documentation

## 2019-04-03 DIAGNOSIS — M791 Myalgia, unspecified site: Secondary | ICD-10-CM | POA: Insufficient documentation

## 2019-04-03 LAB — CBC WITH DIFFERENTIAL/PLATELET
Abs Immature Granulocytes: 0.01 10*3/uL (ref 0.00–0.07)
Basophils Absolute: 0 10*3/uL (ref 0.0–0.1)
Basophils Relative: 1 %
Eosinophils Absolute: 0.2 10*3/uL (ref 0.0–0.5)
Eosinophils Relative: 3 %
HCT: 42.4 % (ref 36.0–46.0)
Hemoglobin: 13.5 g/dL (ref 12.0–15.0)
Immature Granulocytes: 0 %
Lymphocytes Relative: 42 %
Lymphs Abs: 2.3 10*3/uL (ref 0.7–4.0)
MCH: 31.7 pg (ref 26.0–34.0)
MCHC: 31.8 g/dL (ref 30.0–36.0)
MCV: 99.5 fL (ref 80.0–100.0)
Monocytes Absolute: 0.7 10*3/uL (ref 0.1–1.0)
Monocytes Relative: 12 %
Neutro Abs: 2.3 10*3/uL (ref 1.7–7.7)
Neutrophils Relative %: 42 %
Platelets: 125 10*3/uL — ABNORMAL LOW (ref 150–400)
RBC: 4.26 MIL/uL (ref 3.87–5.11)
RDW: 12.4 % (ref 11.5–15.5)
WBC: 5.5 10*3/uL (ref 4.0–10.5)
nRBC: 0 % (ref 0.0–0.2)

## 2019-04-03 LAB — BASIC METABOLIC PANEL
Anion gap: 6 (ref 5–15)
BUN: 13 mg/dL (ref 6–20)
CO2: 28 mmol/L (ref 22–32)
Calcium: 9.4 mg/dL (ref 8.9–10.3)
Chloride: 103 mmol/L (ref 98–111)
Creatinine, Ser: 0.79 mg/dL (ref 0.44–1.00)
GFR calc Af Amer: >60 mL/min (ref >60)
GFR calc non Af Amer: >60 mL/min (ref >60)
Glucose, Bld: 93 mg/dL (ref 70–99)
Potassium: 4.3 mmol/L (ref 3.5–5.1)
Sodium: 137 mmol/L (ref 135–145)

## 2019-04-03 LAB — MAGNESIUM: Magnesium: 2.3 mg/dL (ref 1.7–2.4)

## 2019-04-03 MED ORDER — CYCLOBENZAPRINE HCL 10 MG PO TABS
5.0000 mg | ORAL_TABLET | Freq: Once | ORAL | Status: AC
  Administered 2019-04-03: 5 mg via ORAL
  Filled 2019-04-03: qty 1

## 2019-04-03 MED ORDER — SODIUM CHLORIDE 0.9 % IV BOLUS
1000.0000 mL | Freq: Once | INTRAVENOUS | Status: AC
  Administered 2019-04-03: 1000 mL via INTRAVENOUS

## 2019-04-03 MED ORDER — CYCLOBENZAPRINE HCL 5 MG PO TABS: 10.0000 mg | ORAL_TABLET | Freq: Three times a day (TID) | ORAL | 0 refills | Status: DC | PRN

## 2019-04-03 NOTE — ED Notes (Signed)
Patient will finish fluid bolus before discharge.

## 2019-04-03 NOTE — ED Provider Notes (Signed)
[facility]-EMERGENCY DEPT Provider Note   CSN: [id number] Arrival date & time: 04/03/19  1433    History   Chief Complaint Chief Complaint  Patient presents with  . Muscle Pain    HPI Michele Lucas is a 53 y.o. female.     The history is provided by the patient.  Muscle Pain  This is a chronic problem. The current episode started more than 1 week ago. The problem occurs every several days. Progression since onset: waxing and wanes. Pertinent negatives include no chest pain, no abdominal pain, no headaches and no shortness of breath. Nothing aggravates the symptoms. Relieved by: has tried multiple medications over the years with not much relief.  She has tried nothing for the symptoms. The treatment provided no relief.    Past Medical History:  Diagnosis Date  . Anxiety   . Chronic back pain   . COPD, mild (HCC)   . Emphysema lung (HCC)   . Endometriosis   . Fibromyalgia   . Neuropathic pain   . PTSD (post-traumatic stress disorder)     Patient Active Problem List   Diagnosis Date Noted  . Protein-calorie malnutrition, severe 04/04/2018  . Nicotine abuse 04/04/2018  . Acute respiratory failure with hypoxia (HCC) 04/03/2018  . Acute bronchitis with COPD (HCC) 04/03/2018  . PTSD (post-traumatic stress disorder) 04/03/2018  . Neuropathic pain 04/03/2018  . Fibromyalgia 04/03/2018  . Chronic back pain 04/03/2018  . Anxiety 04/03/2018  . Thrombocytopenia (HCC) 04/03/2018  . Acute hyponatremia 04/03/2018  . Elevated lactic acid level 04/03/2018  . Protein calorie malnutrition (HCC) 04/03/2018  . Pneumothorax on left 10/06/2016  . Chronic pain syndrome 04/02/2015  . Generalized anxiety disorder 04/02/2015  . Hypokalemia 04/02/2015  . Normocytic anemia 04/02/2015  . Abnormal urinalysis 04/02/2015  . SOB (shortness of breath)   . CAP (community acquired pneumonia) 04/01/2015  . COPD exacerbation (HCC) 04/01/2015    Past Surgical History:   Procedure Laterality Date  . CESAREAN SECTION    . copolscopy    . TONSILLECTOMY    . TUBAL LIGATION       OB History   No obstetric history on file.      Home Medications    Prior to Admission medications   Medication Sig Start Date End Date Taking? Authorizing Provider  albuterol (PROVENTIL HFA;VENTOLIN HFA) 108 (90 Base) MCG/ACT inhaler Inhale 2 puffs into the lungs 5 (five) times daily as needed for wheezing or shortness of breath.    Yes [provider]  budesonide-formoterol (SYMBICORT) 160-4.5 MCG/ACT inhaler Inhale 2 puffs into the lungs 2 (two) times daily.   Yes [provider]  buprenorphine-naloxone (SUBOXONE) 8-2 mg SUBL SL tablet Place 1 tablet under the tongue daily.   Yes [provider]  mirtazapine (REMERON) 30 MG tablet Take 30 mg by mouth at bedtime.   Yes [provider]  benzonatate (TESSALON) 200 MG capsule Take 1 capsule (200 mg total) by mouth 3 (three) times daily. Patient not taking: Reported on 03/04/2019 04/13/18   Roberto Scales D, MD  cyclobenzaprine (FLEXERIL) 5 MG tablet Take 2 tablets (10 mg total) by mouth 3 (three) times daily as needed for up to 20 doses for muscle spasms. 04/03/19   Jaterrius Ricketson, DO  feeding supplement, GLUCERNA SHAKE, (GLUCERNA SHAKE) LIQD Take 237 mLs by mouth 3 (three) times daily between meals. Patient not taking: Reported on 03/04/2019 04/13/18   Roberto Scales D, MD  levofloxacin (LEVAQUIN) 500 MG tablet Take 1  tablet (500 mg total) by mouth daily. Patient not taking: Reported on 04/03/2019 03/05/19   Benjiman CorePickering, Nathan, MD  methocarbamol (ROBAXIN) 500 MG tablet Take 1 tablet (500 mg total) by mouth 4 (four) times daily. Patient not taking: Reported on 03/04/2019 10/24/18   Cuthriell, Delorise RoyalsJonathan D, PA-C  mometasone-formoterol (DULERA) 200-5 MCG/ACT AERO Inhale 2 puffs into the lungs 2 (two) times daily. Patient not taking: Reported on 04/03/2019 04/13/18   Roberto ScalesNettey, Shayla D, MD  nicotine (NICODERM CQ -  DOSED IN MG/24 HOURS) 21 mg/24hr patch Place 1 patch (21 mg total) onto the skin daily. Patient not taking: Reported on 03/04/2019 04/14/18   Roberto ScalesNettey, Shayla D, MD  ondansetron (ZOFRAN ODT) 8 MG disintegrating tablet Take 1 tablet (8 mg total) by mouth every 8 (eight) hours as needed for nausea or vomiting. Patient not taking: Reported on 03/04/2019 01/28/19   Linwood DibblesKnapp, Jon, MD  predniSONE (DELTASONE) 10 MG tablet Take 1 tablet (10 mg total) by mouth daily. Patient not taking: Reported on 03/04/2019 10/24/18   Cuthriell, Delorise RoyalsJonathan D, PA-C    Family History Family History  Problem Relation Age of Onset  . Diabetes type II Neg Hx   . Stroke Neg Hx     Social History Social History   Tobacco Use  . Smoking status: Current Every Day Smoker    Packs/day: 0.50    Types: Cigarettes  . Smokeless tobacco: Current User  . Tobacco comment: Previously documented in a pack per day-she now reports 4 to 5 cigarettes/day  Substance Use Topics  . Alcohol use: No  . Drug use: No     Allergies   Guaifenesin & derivatives; Ibuprofen; Neurontin [gabapentin]; Tylenol [acetaminophen]; Ampicillin; and Clindamycin/lincomycin   Review of Systems Review of Systems  Constitutional: Negative for chills and fever.  HENT: Negative for ear pain and sore throat.   Eyes: Negative for pain and visual disturbance.  Respiratory: Negative for cough and shortness of breath.   Cardiovascular: Negative for chest pain and palpitations.  Gastrointestinal: Negative for abdominal pain and vomiting.  Genitourinary: Negative for dysuria and hematuria.  Musculoskeletal: Positive for myalgias (spasms). Negative for arthralgias, back pain, joint swelling, neck pain and neck stiffness.  Skin: Negative for color change and rash.  Neurological: Negative for dizziness, tremors, seizures, syncope, facial asymmetry, speech difficulty, weakness, light-headedness, numbness and headaches.  All other systems reviewed and are negative.     Physical Exam Updated Vital Signs  ED Triage Vitals [04/03/19 1444]  Enc Vitals Group     BP (!) 129/93     Pulse Rate (!) 108     Resp 20     Temp 99.1 F (37.3 C)     Temp Source Oral     SpO2 94 %     Weight      Height      Head Circumference      Peak Flow      Pain Score      Pain Loc      Pain Edu?      Excl. in GC?     Physical Exam Vitals signs and nursing note reviewed.  Constitutional:      General: She is not in acute distress.    Appearance: She is well-developed.  HENT:     Head: Normocephalic and atraumatic.     Mouth/Throat:     Mouth: Mucous membranes are moist.  Eyes:     Extraocular Movements: Extraocular movements intact.     Conjunctiva/sclera: Conjunctivae  normal.     Pupils: Pupils are equal, round, and reactive to light.  Neck:     Musculoskeletal: Neck supple.  Cardiovascular:     Rate and Rhythm: Normal rate and regular rhythm.     Pulses: Normal pulses.     Heart sounds: Normal heart sounds. No murmur.  Pulmonary:     Effort: Pulmonary effort is normal. No respiratory distress.     Breath sounds: Normal breath sounds.  Abdominal:     Palpations: Abdomen is soft.     Tenderness: There is no abdominal tenderness.  Skin:    General: Skin is warm and dry.  Neurological:     General: No focal deficit present.     Mental Status: She is alert and oriented to person, place, and time.     Cranial Nerves: No cranial nerve deficit.     Sensory: No sensory deficit.     Motor: No weakness.     Coordination: Coordination normal.     Gait: Gait normal.     Comments: 5+/5 strength, normal sensation, normal finger to nose finger, no drift, normal speech      ED Treatments / Results  Labs (all labs ordered are listed, but only abnormal results are displayed) Labs Reviewed  CBC WITH DIFFERENTIAL/PLATELET - Abnormal; Notable for the following components:      Result Value   Platelets 125 (*)    All other components within normal limits   BASIC METABOLIC PANEL  MAGNESIUM    EKG None  Radiology No results found.  Procedures Procedures (including critical care time)  Medications Ordered in ED Medications  sodium chloride 0.9 % bolus 1,000 mL (1,000 mLs Intravenous New Bag/Given 04/03/19 1546)  cyclobenzaprine (FLEXERIL) tablet 5 mg (5 mg Oral Given 04/03/19 1533)     Initial Impression / Assessment and Plan / ED Course  I have reviewed the triage vital signs and the nursing notes.  Pertinent labs & imaging results that were available during my care of the patient were reviewed by me and considered in my medical decision making (see chart for details).     Michele Lucas is a 53 year old female with history of fibromyalgia, neuropathy who presents to the ED with ongoing muscle pain.  Patient with unremarkable vitals.  No fever.  Patient here for chronic muscle pain.  Has had increased spasms in her hands over the last several days.  Has tried Lyrica, gabapentin, Robaxin in the past without much help.  Did see a neurologist years ago and was diagnosed with neuropathy in her arms and legs but has not followed up.  She denies any chest pain, shortness of breath.  Patient overall is neurologically intact.  No concern for stroke.  No change in activities.  No concern for rhabdomyolysis.  Does not have any diarrhea, nausea, vomiting.  No reason to be dehydrated however will get basic labs and give patient normal saline bolus.  Will treat with Flexeril which it appears that she has not had in the past.  Will reevaluate.  Will rule out electrolyte abnormality such as low potassium and low magnesium.  Lab work showed no significant electrolyte abnormalities, kidney injury, leukocytosis, anemia.  Patient given reassurance.  Given information to follow-up with neurology.  Suspect ongoing chronic neuropathy.  Will give prescription for Flexeril to see if this helps out.  Given return precautions.  Discharged in good condition.  This  chart was dictated using voice recognition software.  Despite best efforts to proofread,  errors can occur which can change the documentation meaning.    Final Clinical Impressions(s) / ED Diagnoses   Final diagnoses:  Muscle ache    ED Discharge Orders         Ordered    cyclobenzaprine (FLEXERIL) 5 MG tablet  3 times daily PRN     04/03/19 1605           Dvaughn Fickle, Madelaine Bhat, DO 04/03/19 1606

## 2019-04-03 NOTE — ED Triage Notes (Signed)
Patient reports for the past few years she has been having muscle cramps and spasms in her arms, legs, hands and toes.  Pt reports sharp shooting pain up her arms bilaterally when she moves wrong.  Pt reports the cramps are difficult to tolerate

## 2020-06-30 ENCOUNTER — Emergency Department (HOSPITAL_COMMUNITY): Payer: Medicare Other

## 2020-06-30 ENCOUNTER — Encounter (HOSPITAL_COMMUNITY): Payer: Self-pay

## 2020-06-30 ENCOUNTER — Emergency Department (HOSPITAL_COMMUNITY)
Admission: EM | Admit: 2020-06-30 | Discharge: 2020-06-30 | Disposition: A | Discharge status: Home / Self Care | Payer: Medicare Other | Attending: Emergency Medicine | Admitting: Emergency Medicine

## 2020-06-30 ENCOUNTER — Other Ambulatory Visit: Payer: Self-pay

## 2020-06-30 DIAGNOSIS — Z79899 Other long term (current) drug therapy: Secondary | ICD-10-CM | POA: Diagnosis not present

## 2020-06-30 DIAGNOSIS — R1011 Right upper quadrant pain: Secondary | ICD-10-CM | POA: Insufficient documentation

## 2020-06-30 DIAGNOSIS — J449 Chronic obstructive pulmonary disease, unspecified: Secondary | ICD-10-CM | POA: Diagnosis not present

## 2020-06-30 DIAGNOSIS — R000 Tachycardia, unspecified: Secondary | ICD-10-CM | POA: Diagnosis not present

## 2020-06-30 DIAGNOSIS — F17210 Nicotine dependence, cigarettes, uncomplicated: Secondary | ICD-10-CM | POA: Insufficient documentation

## 2020-06-30 DIAGNOSIS — R109 Unspecified abdominal pain: Secondary | ICD-10-CM | POA: Diagnosis present

## 2020-06-30 HISTORY — DX: Calculus of kidney: N20.0

## 2020-06-30 LAB — URINALYSIS, ROUTINE W REFLEX MICROSCOPIC
Bilirubin Urine: NEGATIVE
Glucose, UA: NEGATIVE mg/dL
Hgb urine dipstick: NEGATIVE
Ketones, ur: NEGATIVE mg/dL
Leukocytes,Ua: NEGATIVE
Nitrite: NEGATIVE
Protein, ur: NEGATIVE mg/dL
Specific Gravity, Urine: 1.019 (ref 1.005–1.030)
pH: 5 (ref 5.0–8.0)

## 2020-06-30 LAB — HEPATIC FUNCTION PANEL
ALT: 13 U/L (ref 0–44)
AST: 17 U/L (ref 15–41)
Albumin: 3.7 g/dL (ref 3.5–5.0)
Alkaline Phosphatase: 48 U/L (ref 38–126)
Bilirubin, Direct: <0.1 mg/dL (ref 0.0–0.2)
Total Bilirubin: 0.3 mg/dL (ref 0.3–1.2)
Total Protein: 7.2 g/dL (ref 6.5–8.1)

## 2020-06-30 LAB — RAPID URINE DRUG SCREEN, HOSP PERFORMED
Amphetamines: NOT DETECTED
Barbiturates: NOT DETECTED
Benzodiazepines: NOT DETECTED
Cocaine: NOT DETECTED
Opiates: POSITIVE — AB
Tetrahydrocannabinol: POSITIVE — AB

## 2020-06-30 LAB — CBC
HCT: 45.3 % (ref 36.0–46.0)
Hemoglobin: 13.9 g/dL (ref 12.0–15.0)
MCH: 31 pg (ref 26.0–34.0)
MCHC: 30.7 g/dL (ref 30.0–36.0)
MCV: 101.1 fL — ABNORMAL HIGH (ref 80.0–100.0)
Platelets: 124 10*3/uL — ABNORMAL LOW (ref 150–400)
RBC: 4.48 MIL/uL (ref 3.87–5.11)
RDW: 12.8 % (ref 11.5–15.5)
WBC: 7.5 10*3/uL (ref 4.0–10.5)
nRBC: 0 % (ref 0.0–0.2)

## 2020-06-30 LAB — BASIC METABOLIC PANEL
Anion gap: 8 (ref 5–15)
BUN: 9 mg/dL (ref 6–20)
CO2: 28 mmol/L (ref 22–32)
Calcium: 9.5 mg/dL (ref 8.9–10.3)
Chloride: 103 mmol/L (ref 98–111)
Creatinine, Ser: 0.57 mg/dL (ref 0.44–1.00)
GFR calc Af Amer: >60 mL/min (ref >60)
GFR calc non Af Amer: >60 mL/min (ref >60)
Glucose, Bld: 100 mg/dL — ABNORMAL HIGH (ref 70–99)
Potassium: 4 mmol/L (ref 3.5–5.1)
Sodium: 139 mmol/L (ref 135–145)

## 2020-06-30 LAB — LIPASE, BLOOD: Lipase: 19 U/L (ref 11–51)

## 2020-06-30 LAB — D-DIMER, QUANTITATIVE: D-Dimer, Quant: 0.29 ug/mL-FEU (ref 0.00–0.50)

## 2020-06-30 MED ORDER — HYDROXYZINE HCL 25 MG PO TABS
25.0000 mg | ORAL_TABLET | Freq: Four times a day (QID) | ORAL | 0 refills | Status: DC
Start: 2020-06-30 — End: 2021-11-26

## 2020-06-30 MED ORDER — LORAZEPAM 2 MG/ML IJ SOLN
1.0000 mg | Freq: Once | INTRAMUSCULAR | Status: AC
  Administered 2020-06-30: 1 mg via INTRAVENOUS
  Filled 2020-06-30: qty 1

## 2020-06-30 MED ORDER — SODIUM CHLORIDE 0.9 % IV BOLUS (SEPSIS)
1000.0000 mL | Freq: Once | INTRAVENOUS | Status: AC
  Administered 2020-06-30: 1000 mL via INTRAVENOUS

## 2020-06-30 MED ORDER — DROPERIDOL 2.5 MG/ML IJ SOLN
2.5000 mg | Freq: Once | INTRAMUSCULAR | Status: AC
  Administered 2020-06-30: 2.5 mg via INTRAVENOUS
  Filled 2020-06-30: qty 2

## 2020-06-30 MED ORDER — CYCLOBENZAPRINE HCL 10 MG PO TABS: 10.0000 mg | ORAL_TABLET | Freq: Two times a day (BID) | ORAL | 0 refills | Status: AC | PRN

## 2020-06-30 MED ORDER — MORPHINE SULFATE (PF) 4 MG/ML IV SOLN
4.0000 mg | Freq: Once | INTRAVENOUS | Status: AC
  Administered 2020-06-30: 4 mg via INTRAVENOUS
  Filled 2020-06-30: qty 1

## 2020-06-30 MED ORDER — KETOROLAC TROMETHAMINE 15 MG/ML IJ SOLN
15.0000 mg | Freq: Once | INTRAMUSCULAR | Status: AC
  Administered 2020-06-30: 15 mg via INTRAVENOUS
  Filled 2020-06-30: qty 1

## 2020-06-30 NOTE — ED Provider Notes (Signed)
  Face-to-face evaluation   History: She presents for evaluation of right flank pain.  Onset yesterday, worse today so came to the ED for evaluation.  She is with her daughter.  Physical exam: Alert frail elderly appearing female.  She has moderate tenderness of the right lower chest wall, right pelvis and right abdomen.  The structures are all adjacent to each other.  Due to her body habitus.  Medical screening examination/treatment/procedure(s) were conducted as a shared visit with non-physician practitioner(s) and myself.  I personally evaluated the patient during the encounter    Mancel Bale, MD 06/30/20 2050

## 2020-06-30 NOTE — ED Triage Notes (Addendum)
Pt arrived via [facility] for c/o right flank pain since yesterday. Pt c/o 10/10 sharp pain in right flank area. Pt states pain worse with inspiration. Pt is crying and flailing around in bed with pain. Pt is A&Ox4. Pt is sinus tach on monitor

## 2020-06-30 NOTE — Discharge Instructions (Signed)
Thank you for allowing me to care for you today. Please return to the emergency department if you have new or worsening symptoms. Take your medications as instructed.  ° °

## 2020-06-30 NOTE — ED Provider Notes (Signed)
[facility] EMERGENCY DEPARTMENT Provider Note   CSN: [id number] Arrival date & time: 06/30/20  9093     History Chief Complaint  Patient presents with  . Flank Pain    Shay Jhaveri is a 54 y.o. female.  Patient is a 53 year old female with past medical history of anxiety, COPD, fibromyalgia, kidney stones who presents emergency department for right flank pain which has been gradually getting worse since yesterday.  Patient reports that this feels like a kidney stone but also started while she was playing with her grandkids so she is not sure if it is a stone or a pulled muscle.  The pain is worse when she takes a deep breath and when she tries to move. It is better at rest.  Denies any dysuria, nausea, vomiting, fever, chills, chest pain, shortness of breath.  She is eating and drinking normally and eating does not affect her pain. Patient arrives via EMS in his crying out hysterically        Past Medical History:  Diagnosis Date  . Anxiety   . Chronic back pain   . COPD, mild (HCC)   . Emphysema lung (HCC)   . Endometriosis   . Fibromyalgia   . Kidney stones   . Neuropathic pain   . PTSD (post-traumatic stress disorder)     Patient Active Problem List   Diagnosis Date Noted  . Protein-calorie malnutrition, severe 04/04/2018  . Nicotine abuse 04/04/2018  . Acute respiratory failure with hypoxia (HCC) 04/03/2018  . Acute bronchitis with COPD (HCC) 04/03/2018  . PTSD (post-traumatic stress disorder) 04/03/2018  . Neuropathic pain 04/03/2018  . Fibromyalgia 04/03/2018  . Chronic back pain 04/03/2018  . Anxiety 04/03/2018  . Thrombocytopenia (HCC) 04/03/2018  . Acute hyponatremia 04/03/2018  . Elevated lactic acid level 04/03/2018  . Protein calorie malnutrition (HCC) 04/03/2018  . Pneumothorax on left 10/06/2016  . Chronic pain syndrome 04/02/2015  . Generalized anxiety disorder 04/02/2015  . Hypokalemia 04/02/2015  . Normocytic anemia  04/02/2015  . Abnormal urinalysis 04/02/2015  . SOB (shortness of breath)   . CAP (community acquired pneumonia) 04/01/2015  . COPD exacerbation (HCC) 04/01/2015    Past Surgical History:  Procedure Laterality Date  . CESAREAN SECTION    . copolscopy    . TONSILLECTOMY    . TUBAL LIGATION       OB History   No obstetric history on file.     Family History  Problem Relation Age of Onset  . Diabetes type II Neg Hx   . Stroke Neg Hx     Social History   Tobacco Use  . Smoking status: Current Every Day Smoker    Packs/day: 0.50    Types: Cigarettes  . Smokeless tobacco: Current User  . Tobacco comment: Previously documented in a pack per day-she now reports 4 to 5 cigarettes/day  Vaping Use  . Vaping Use: Never used  Substance Use Topics  . Alcohol use: No  . Drug use: No    Home Medications Prior to Admission medications   Medication Sig Start Date End Date Taking? Authorizing Provider  albuterol (PROVENTIL HFA;VENTOLIN HFA) 108 (90 Base) MCG/ACT inhaler Inhale 2 puffs into the lungs 5 (five) times daily as needed for wheezing or shortness of breath.     [provider]  benzonatate (TESSALON) 200 MG capsule Take 1 capsule (200 mg total) by mouth 3 (three) times daily. Patient not taking: Reported on 03/04/2019 04/13/18   Laverna Peace,  MD  budesonide-formoterol (SYMBICORT) 160-4.5 MCG/ACT inhaler Inhale 2 puffs into the lungs 2 (two) times daily.    [provider]  buprenorphine-naloxone (SUBOXONE) 8-2 mg SUBL SL tablet Place 1 tablet under the tongue daily.    [provider]  cyclobenzaprine (FLEXERIL) 10 MG tablet Take 1 tablet (10 mg total) by mouth 2 (two) times daily as needed for up to 7 days for muscle spasms. 06/30/20 07/07/20  Ronnie Doss A, PA-C  feeding supplement, GLUCERNA SHAKE, (GLUCERNA SHAKE) LIQD Take 237 mLs by mouth 3 (three) times daily between meals. Patient not taking: Reported on 03/04/2019 04/13/18   Roberto Scales D,  MD  hydrOXYzine (ATARAX/VISTARIL) 25 MG tablet Take 1 tablet (25 mg total) by mouth every 6 (six) hours. 06/30/20   Arlyn Dunning, PA-C  levofloxacin (LEVAQUIN) 500 MG tablet Take 1 tablet (500 mg total) by mouth daily. Patient not taking: Reported on 04/03/2019 03/05/19   Benjiman Core, MD  methocarbamol (ROBAXIN) 500 MG tablet Take 1 tablet (500 mg total) by mouth 4 (four) times daily. Patient not taking: Reported on 03/04/2019 10/24/18   Cuthriell, Delorise Royals, PA-C  mirtazapine (REMERON) 30 MG tablet Take 30 mg by mouth at bedtime.    [provider]  mometasone-formoterol (DULERA) 200-5 MCG/ACT AERO Inhale 2 puffs into the lungs 2 (two) times daily. Patient not taking: Reported on 04/03/2019 04/13/18   Roberto Scales D, MD  nicotine (NICODERM CQ - DOSED IN MG/24 HOURS) 21 mg/24hr patch Place 1 patch (21 mg total) onto the skin daily. Patient not taking: Reported on 03/04/2019 04/14/18   Roberto Scales D, MD  ondansetron (ZOFRAN ODT) 8 MG disintegrating tablet Take 1 tablet (8 mg total) by mouth every 8 (eight) hours as needed for nausea or vomiting. Patient not taking: Reported on 03/04/2019 01/28/19   Linwood Dibbles, MD  predniSONE (DELTASONE) 10 MG tablet Take 1 tablet (10 mg total) by mouth daily. Patient not taking: Reported on 03/04/2019 10/24/18   Cuthriell, Delorise Royals, PA-C    Allergies    Guaifenesin & derivatives, Ibuprofen, Neurontin [gabapentin], Tylenol [acetaminophen], Ampicillin, and Clindamycin/lincomycin  Review of Systems   Review of Systems  Constitutional: Negative for activity change, appetite change, chills and fever.  HENT: Negative.   Respiratory: Negative for cough and shortness of breath.   Cardiovascular: Negative for chest pain and leg swelling.  Gastrointestinal: Negative for abdominal pain, constipation, diarrhea, nausea and vomiting.  Genitourinary: Positive for flank pain. Negative for decreased urine volume, difficulty urinating, dyspareunia, dysuria,  enuresis, hematuria, menstrual problem, pelvic pain, urgency, vaginal bleeding, vaginal discharge and vaginal pain.  Musculoskeletal: Negative for back pain.  Skin: Negative for rash.  Neurological: Negative for dizziness and seizures.  All other systems reviewed and are negative.   Physical Exam Updated Vital Signs BP 128/74 (BP Location: Left Arm)   Pulse 91   Temp 98.4 F (36.9 C) (Oral)   Resp 19   Ht 5\' 2"  (1.575 m)   Wt 45.4 kg   SpO2 91%   BMI 18.29 kg/m   Physical Exam Vitals and nursing note reviewed.  Constitutional:      Appearance: Normal appearance.     Comments: She appears in pain and is crying out hysterically.  She is not toxic appearing  HENT:     Head: Normocephalic.     Nose: Nose normal.     Mouth/Throat:     Mouth: Mucous membranes are moist.  Eyes:     Conjunctiva/sclera: Conjunctivae normal.  Cardiovascular:     Rate and Rhythm: Regular rhythm. Tachycardia present.  Pulmonary:     Effort: Pulmonary effort is normal.     Breath sounds: Normal breath sounds.  Abdominal:     General: Abdomen is flat.     Tenderness: There is abdominal tenderness in the right upper quadrant and right lower quadrant.    Genitourinary:    Comments: Right CVA tenderness Skin:    General: Skin is dry.  Neurological:     Mental Status: She is alert.  Psychiatric:        Mood and Affect: Mood normal.     ED Results / Procedures / Treatments   Labs (all labs ordered are listed, but only abnormal results are displayed) Labs Reviewed  BASIC METABOLIC PANEL - Abnormal; Notable for the following components:      Result Value   Glucose, Bld 100 (*)    All other components within normal limits  CBC - Abnormal; Notable for the following components:   MCV 101.1 (*)    Platelets 124 (*)    All other components within normal limits  RAPID URINE DRUG SCREEN, HOSP PERFORMED - Abnormal; Notable for the following components:   Opiates POSITIVE (*)     Tetrahydrocannabinol POSITIVE (*)    All other components within normal limits  URINALYSIS, ROUTINE W REFLEX MICROSCOPIC  LIPASE, BLOOD  HEPATIC FUNCTION PANEL  D-DIMER, QUANTITATIVE (NOT AT [facility])    EKG EKG Interpretation  Date/Time:  Friday June 30 2020 10:05:09 EDT Ventricular Rate:  77 PR Interval:    QRS Duration: 62 QT Interval:  388 QTC Calculation: 440 R Axis:   74 Text Interpretation: Sinus rhythm Left ventricular hypertrophy Since last tracing rate slower Otherwise no significant change Confirmed by Mancel BaleWentz, Elliott [phone]([id number]) on 06/30/2020 10:37:40 AM   Radiology DG Chest Port 1 View  Result Date: 06/30/2020 CLINICAL DATA:  54 year old female with shortness of breath. EXAM: PORTABLE CHEST 1 VIEW COMPARISON:  None. FINDINGS: The cardiomediastinal silhouette is unremarkable. There is no evidence of focal airspace disease, pulmonary edema, suspicious pulmonary nodule/mass, pleural effusion, or pneumothorax. No acute bony abnormalities are identified. IMPRESSION: No active disease. Electronically Signed   By: Harmon PierJeffrey  Hu M.D.   On: 06/30/2020 10:16   CT Renal Stone Study  Result Date: 06/30/2020 CLINICAL DATA:  Flank pain, suspected kidney stone EXAM: CT ABDOMEN AND PELVIS WITHOUT CONTRAST TECHNIQUE: Multidetector CT imaging of the abdomen and pelvis was performed following the standard protocol without IV contrast. COMPARISON:  None FINDINGS: Lower chest: Lung bases with basilar atelectasis. No effusion. No consolidation. Hepatobiliary: No focal lesion on very limited assessment given lack of contrast. Normal size and contour of the liver. No pericholecystic stranding. Pancreas: Limited assessment of the pancreas without evidence of peripancreatic stranding. Spleen: Normal in size and contour. Adrenals/Urinary Tract: Limited assessment of the adrenal glands without focal abnormality. Renal size is normal with subtle increased medullary attenuation and scattered small calculi  bilaterally. Five punctate calculi are seen in the LEFT kidney. Single punctate calculus in the interpolar RIGHT kidney with potential for 2 additional calculi in the lower pole. Paucity of intra-abdominal and retroperitoneal fat limiting assessment of the ureters. Calcifications are present in the bilateral pelvis more suggestive of phleboliths. No hydronephrosis or perinephric stranding. Stomach/Bowel: Stomach under distended, limiting assessment. Lack of mesenteric fat also limits evaluation of gastrointestinal structures Vascular/Lymphatic: Calcified atheromatous plaque of the abdominal aorta. No aneurysmal dilation. No retroperitoneal adenopathy. No pelvic adenopathy. Reproductive: No adnexal mass.  Uterus not well seen amongst bowel loops that extend into the pelvis due to lack of mesenteric fat. Other: No ascites.  No free air. Musculoskeletal: No acute musculoskeletal finding. No destructive bone process. IMPRESSION: 1. Nephrolithiasis. No definite ureteral calculus though the study is limited by factors described above. No hydronephrosis or perinephric stranding. 2. Subtle increased density of the renal medulla bilaterally could be seen in the setting of dehydration. Also raising the question of very early medullary nephrocalcinosis. 3. Question of mild mesenteric edema in the setting of very limited mesenteric fat. This appearance could also be due to the profound lack of mesenteric and intra-abdominal fat. If there is a suspicion for gastrointestinal pathology and continued pain study with intravenous and or oral contrast could be helpful. 4. Aortic atherosclerosis. Aortic Atherosclerosis (ICD10-I70.0). Electronically Signed   By: Donzetta Kohut M.D.   On: 06/30/2020 11:26   US Abdomen Limited RUQ  Result Date: 06/30/2020 CLINICAL DATA:  RIGHT upper quadrant pain since this morning EXAM: ULTRASOUND ABDOMEN LIMITED RIGHT UPPER QUADRANT COMPARISON:  CT abdomen and pelvis 06/30/2020 FINDINGS: Gallbladder:  Normally distended without stones or wall thickening. No pericholecystic fluid. Unable to assess for sonographic Eulah Pont sign due to administration of pain medication. Common bile duct: Diameter: 2 mm, normal Liver: Normal echogenicity without mass or nodularity. Portal vein is patent on color Doppler imaging with normal direction of blood flow towards the liver. Other: No RIGHT upper quadrant free fluid. Examination limited by fetal position of patient. IMPRESSION: Negative RIGHT upper quadrant ultrasound. Electronically Signed   By: Ulyses Southward M.D.   On: 06/30/2020 14:20    Procedures Procedures (including critical care time)  Medications Ordered in ED Medications  LORazepam (ATIVAN) injection 1 mg (1 mg Intravenous Given 06/30/20 0954)  ketorolac (TORADOL) 15 MG/ML injection 15 mg (15 mg Intravenous Given 06/30/20 0954)  sodium chloride 0.9 % bolus 1,000 mL (0 mLs Intravenous Stopped 06/30/20 1116)  morphine 4 MG/ML injection 4 mg (4 mg Intravenous Given 06/30/20 1221)  morphine 4 MG/ML injection 4 mg (4 mg Intravenous Given 06/30/20 1312)  sodium chloride 0.9 % bolus 1,000 mL (0 mLs Intravenous Stopped 06/30/20 1437)  droperidol (INAPSINE) 2.5 MG/ML injection 2.5 mg (2.5 mg Intravenous Given 06/30/20 1532)    ED Course  I have reviewed the triage vital signs and the nursing notes.  Pertinent labs & imaging results that were available during my care of the patient were reviewed by me and considered in my medical decision making (see chart for details).  Clinical Course as of Jun 30 1622  Fri Jun 30, 2020  3810 Patient presenting with right flank pain and in a significant amount of pain.  Mildly tachycardic but hemodynamically stable.  Patient is hysterically crying.  Pain medication and Ativan and fluids were ordered.  Will perform lab work and CT renal study.  Differential includes kidney stone, muscle spasm, cholecystitis   [KM]  1247 Patient has received toradol, ativan, morphine and fluids  and still having significant right sided pain and moaning and crying out load in bed. Ct renal study not showing any acute emergent finding including no kidney stone but the exam is reportedly limited due to lack of mesenteric fat and contrast. Given her significant pain we will obtain RUQ ultrasound. She also was complaining of some SOB and has been tachycardic so ddimer will be added on. She has hx of COPD and is supposed to sue home oxygen but daughter at bedside reports she doesn't use it  as she is supposed to. She is afebrile and hemodynamically stable.    [KM]  1439 Patient is resting more comfortable but still moaning in bed. RUQ ultrasound is normal. Still having Pain in the RUQ. Discussed with patient possible etiologies including musculokeletal as given that her pain is worse with movement and started while playing with her grandchildren. Her labs are also reassuring. However, I also discussed with patient that the initial Ct w/o contrast did not full assess her appendix or intestines and we could repeat CT with contrast if she felt this was necessary. Patient reports she would like to get up and walk and give urine sample first. She appears to have significant anxiety and initial ativan was helpful.    [KM]  1616 Patient was improved with droperidol.  When I enter the room she is sleeping and I push on her belly in she has a soft and nontender belly.  When I wake her up she begins to moan and say that it hurts again.  Her daughter is at bedside he questions if some of this could be psychological.  I agree that that may be the case.  I advised patient and her daughter that we will discharge her home with a prescription for muscle relaxer and hydroxyzine.  They were advised on return precautions and if for any reason at all she feels like the pain is worsening or she has any new symptoms she should return to the emergency department.  I offered them to have a CT with contrast today prior to leaving.   However, they are both in agreement with plan of trialing p.o. medications at home and will return if any new or worsening symptoms.   [KM]    Clinical Course User Index [KM] Jeral Pinch   MDM Rules/Calculators/A&P                          Based on review of vitals, medical screening exam, lab work and/or imaging, there does not appear to be an acute, emergent etiology for the patient's symptoms. Counseled pt on good return precautions and encouraged both PCP and ED follow-up as needed.  Prior to discharge, I also discussed incidental imaging findings with patient in detail and advised appropriate, recommended follow-up in detail.  Clinical Impression: 1. RUQ pain     Disposition: Discharge   This note was prepared with assistance of Dragon voice recognition software. Occasional wrong-word or sound-a-like substitutions may have occurred due to the inherent limitations of voice recognition software.  Final Clinical Impression(s) / ED Diagnoses Final diagnoses:  RUQ pain    Rx / DC Orders ED Discharge Orders         Ordered    hydrOXYzine (ATARAX/VISTARIL) 25 MG tablet  Every 6 hours     Discontinue  Reprint     06/30/20 1623    cyclobenzaprine (FLEXERIL) 10 MG tablet  2 times daily PRN     Discontinue  Reprint     06/30/20 1623           Jeral Pinch 06/30/20 1623    Mancel Bale, MD 06/30/20 2050

## 2021-05-10 ENCOUNTER — Other Ambulatory Visit: Payer: Self-pay | Admitting: Neurosurgery

## 2021-05-10 DIAGNOSIS — M5416 Radiculopathy, lumbar region: Secondary | ICD-10-CM

## 2021-05-23 ENCOUNTER — Other Ambulatory Visit

## 2021-11-26 ENCOUNTER — Emergency Department (HOSPITAL_COMMUNITY): Payer: Medicare Other

## 2021-11-26 ENCOUNTER — Other Ambulatory Visit: Payer: Self-pay

## 2021-11-26 ENCOUNTER — Encounter (HOSPITAL_COMMUNITY): Payer: Self-pay

## 2021-11-26 ENCOUNTER — Inpatient Hospital Stay (HOSPITAL_COMMUNITY)
Admission: EM | Admit: 2021-11-26 | Discharge: 2021-11-28 | DRG: 193 | Disposition: A | Discharge status: Home / Self Care | Payer: Medicare Other | Attending: Internal Medicine | Admitting: Internal Medicine

## 2021-11-26 DIAGNOSIS — R636 Underweight: Secondary | ICD-10-CM | POA: Diagnosis present

## 2021-11-26 DIAGNOSIS — D696 Thrombocytopenia, unspecified: Secondary | ICD-10-CM | POA: Diagnosis present

## 2021-11-26 DIAGNOSIS — J189 Pneumonia, unspecified organism: Principal | ICD-10-CM | POA: Diagnosis present

## 2021-11-26 DIAGNOSIS — J209 Acute bronchitis, unspecified: Secondary | ICD-10-CM | POA: Diagnosis present

## 2021-11-26 DIAGNOSIS — I5189 Other ill-defined heart diseases: Secondary | ICD-10-CM

## 2021-11-26 DIAGNOSIS — Z7951 Long term (current) use of inhaled steroids: Secondary | ICD-10-CM

## 2021-11-26 DIAGNOSIS — J439 Emphysema, unspecified: Secondary | ICD-10-CM | POA: Diagnosis present

## 2021-11-26 DIAGNOSIS — Z888 Allergy status to other drugs, medicaments and biological substances status: Secondary | ICD-10-CM

## 2021-11-26 DIAGNOSIS — J9601 Acute respiratory failure with hypoxia: Secondary | ICD-10-CM | POA: Diagnosis present

## 2021-11-26 DIAGNOSIS — Z880 Allergy status to penicillin: Secondary | ICD-10-CM

## 2021-11-26 DIAGNOSIS — E440 Moderate protein-calorie malnutrition: Secondary | ICD-10-CM | POA: Insufficient documentation

## 2021-11-26 DIAGNOSIS — J441 Chronic obstructive pulmonary disease with (acute) exacerbation: Secondary | ICD-10-CM | POA: Diagnosis present

## 2021-11-26 DIAGNOSIS — Z681 Body mass index (BMI) 19 or less, adult: Secondary | ICD-10-CM

## 2021-11-26 DIAGNOSIS — G894 Chronic pain syndrome: Secondary | ICD-10-CM | POA: Diagnosis present

## 2021-11-26 DIAGNOSIS — Z79899 Other long term (current) drug therapy: Secondary | ICD-10-CM

## 2021-11-26 DIAGNOSIS — F17210 Nicotine dependence, cigarettes, uncomplicated: Secondary | ICD-10-CM | POA: Diagnosis present

## 2021-11-26 DIAGNOSIS — Z720 Tobacco use: Secondary | ICD-10-CM | POA: Diagnosis present

## 2021-11-26 DIAGNOSIS — M797 Fibromyalgia: Secondary | ICD-10-CM | POA: Diagnosis present

## 2021-11-26 DIAGNOSIS — Z20822 Contact with and (suspected) exposure to covid-19: Secondary | ICD-10-CM | POA: Diagnosis present

## 2021-11-26 DIAGNOSIS — F4310 Post-traumatic stress disorder, unspecified: Secondary | ICD-10-CM | POA: Diagnosis present

## 2021-11-26 HISTORY — DX: Other ill-defined heart diseases: I51.89

## 2021-11-26 LAB — RESP PANEL BY RT-PCR (FLU A&B, COVID) ARPGX2
Influenza A by PCR: NEGATIVE
Influenza B by PCR: NEGATIVE
SARS Coronavirus 2 by RT PCR: NEGATIVE

## 2021-11-26 LAB — BASIC METABOLIC PANEL
Anion gap: 9 (ref 5–15)
BUN: 11 mg/dL (ref 6–20)
CO2: 28 mmol/L (ref 22–32)
Calcium: 9.5 mg/dL (ref 8.9–10.3)
Chloride: 104 mmol/L (ref 98–111)
Creatinine, Ser: 0.57 mg/dL (ref 0.44–1.00)
GFR, Estimated: >60 mL/min (ref >60)
Glucose, Bld: 108 mg/dL — ABNORMAL HIGH (ref 70–99)
Potassium: 4 mmol/L (ref 3.5–5.1)
Sodium: 141 mmol/L (ref 135–145)

## 2021-11-26 LAB — HEPATIC FUNCTION PANEL
ALT: 22 U/L (ref 0–44)
AST: 24 U/L (ref 15–41)
Albumin: 4.1 g/dL (ref 3.5–5.0)
Alkaline Phosphatase: 45 U/L (ref 38–126)
Bilirubin, Direct: 0.1 mg/dL (ref 0.0–0.2)
Indirect Bilirubin: 0.7 mg/dL (ref 0.3–0.9)
Total Bilirubin: 0.8 mg/dL (ref 0.3–1.2)
Total Protein: 7.6 g/dL (ref 6.5–8.1)

## 2021-11-26 LAB — CBC WITH DIFFERENTIAL/PLATELET
Abs Immature Granulocytes: 0.01 10*3/uL (ref 0.00–0.07)
Basophils Absolute: 0 10*3/uL (ref 0.0–0.1)
Basophils Relative: 0 %
Eosinophils Absolute: 0 10*3/uL (ref 0.0–0.5)
Eosinophils Relative: 0 %
HCT: 45.5 % (ref 36.0–46.0)
Hemoglobin: 14.2 g/dL (ref 12.0–15.0)
Immature Granulocytes: 0 %
Lymphocytes Relative: 14 %
Lymphs Abs: 1.2 10*3/uL (ref 0.7–4.0)
MCH: 31.9 pg (ref 26.0–34.0)
MCHC: 31.2 g/dL (ref 30.0–36.0)
MCV: 102.2 fL — ABNORMAL HIGH (ref 80.0–100.0)
Monocytes Absolute: 0.5 10*3/uL (ref 0.1–1.0)
Monocytes Relative: 6 %
Neutro Abs: 7 10*3/uL (ref 1.7–7.7)
Neutrophils Relative %: 80 %
Platelets: 118 10*3/uL — ABNORMAL LOW (ref 150–400)
RBC: 4.45 MIL/uL (ref 3.87–5.11)
RDW: 12.2 % (ref 11.5–15.5)
WBC: 8.8 10*3/uL (ref 4.0–10.5)
nRBC: 0 % (ref 0.0–0.2)

## 2021-11-26 LAB — TROPONIN I (HIGH SENSITIVITY)
Troponin I (High Sensitivity): 4 ng/L (ref <18)
Troponin I (High Sensitivity): 3 ng/L (ref <18)

## 2021-11-26 MED ORDER — ACETAMINOPHEN 325 MG PO TABS
650.0000 mg | ORAL_TABLET | Freq: Four times a day (QID) | ORAL | Status: DC | PRN
  Administered 2021-11-27: 13:00:00 650 mg via ORAL
  Filled 2021-11-26 (×2): qty 2

## 2021-11-26 MED ORDER — POTASSIUM CHLORIDE 20 MEQ PO PACK
20.0000 meq | PACK | Freq: Once | ORAL | Status: AC
  Administered 2021-11-26: 20:00:00 20 meq via ORAL
  Filled 2021-11-26: qty 1

## 2021-11-26 MED ORDER — MORPHINE SULFATE (PF) 2 MG/ML IV SOLN: 2.0000 mg | Freq: Once | INTRAVENOUS | Status: DC

## 2021-11-26 MED ORDER — SODIUM CHLORIDE 0.9 % IV SOLN
500.0000 mg | INTRAVENOUS | Status: DC
  Administered 2021-11-26 – 2021-11-27 (×2): 500 mg via INTRAVENOUS
  Filled 2021-11-26 (×2): qty 5

## 2021-11-26 MED ORDER — MORPHINE SULFATE (PF) 4 MG/ML IV SOLN: 4.0000 mg | Freq: Once | INTRAVENOUS | Status: DC

## 2021-11-26 MED ORDER — POTASSIUM CHLORIDE CRYS ER 20 MEQ PO TBCR
20.0000 meq | EXTENDED_RELEASE_TABLET | Freq: Once | ORAL | Status: DC
  Filled 2021-11-26: qty 1

## 2021-11-26 MED ORDER — IPRATROPIUM-ALBUTEROL 0.5-2.5 (3) MG/3ML IN SOLN
3.0000 mL | RESPIRATORY_TRACT | Status: DC | PRN
  Administered 2021-11-26: 12:00:00 3 mL via RESPIRATORY_TRACT
  Filled 2021-11-26: qty 3

## 2021-11-26 MED ORDER — HYDROXYZINE HCL 25 MG PO TABS
25.0000 mg | ORAL_TABLET | Freq: Four times a day (QID) | ORAL | Status: DC | PRN
  Administered 2021-11-26 – 2021-11-28 (×8): 25 mg via ORAL
  Filled 2021-11-26 (×8): qty 1

## 2021-11-26 MED ORDER — AZITHROMYCIN 250 MG PO TABS
500.0000 mg | ORAL_TABLET | Freq: Every day | ORAL | Status: DC
  Filled 2021-11-26: qty 2

## 2021-11-26 MED ORDER — PREDNISONE 20 MG PO TABS
40.0000 mg | ORAL_TABLET | Freq: Every day | ORAL | Status: DC
  Filled 2021-11-26: qty 2

## 2021-11-26 MED ORDER — SODIUM CHLORIDE 0.9 % IV SOLN: 1.0000 g | INTRAVENOUS | Status: DC

## 2021-11-26 MED ORDER — IPRATROPIUM-ALBUTEROL 0.5-2.5 (3) MG/3ML IN SOLN
3.0000 mL | Freq: Once | RESPIRATORY_TRACT | Status: AC
  Administered 2021-11-26: 09:00:00 3 mL via RESPIRATORY_TRACT
  Filled 2021-11-26: qty 3

## 2021-11-26 MED ORDER — ONDANSETRON HCL 4 MG/2ML IJ SOLN: 4.0000 mg | Freq: Four times a day (QID) | INTRAMUSCULAR | Status: DC | PRN

## 2021-11-26 MED ORDER — ENOXAPARIN SODIUM 40 MG/0.4ML IJ SOSY: 40.0000 mg | PREFILLED_SYRINGE | INTRAMUSCULAR | Status: DC

## 2021-11-26 MED ORDER — OXYCODONE HCL 5 MG PO TABS
5.0000 mg | ORAL_TABLET | Freq: Four times a day (QID) | ORAL | Status: DC | PRN
  Administered 2021-11-26 – 2021-11-28 (×8): 5 mg via ORAL
  Filled 2021-11-26 (×8): qty 1

## 2021-11-26 MED ORDER — LACTATED RINGERS IV BOLUS
1000.0000 mL | Freq: Once | INTRAVENOUS | Status: AC
  Administered 2021-11-26: 14:00:00 1000 mL via INTRAVENOUS

## 2021-11-26 MED ORDER — MAGNESIUM SULFATE 2 GM/50ML IV SOLN
2.0000 g | Freq: Once | INTRAVENOUS | Status: AC
  Administered 2021-11-26: 09:00:00 2 g via INTRAVENOUS
  Filled 2021-11-26: qty 50

## 2021-11-26 MED ORDER — HYDROMORPHONE HCL 1 MG/ML IJ SOLN
1.0000 mg | Freq: Once | INTRAMUSCULAR | Status: AC
  Administered 2021-11-26: 17:00:00 1 mg via INTRAVENOUS
  Filled 2021-11-26: qty 1

## 2021-11-26 MED ORDER — ONDANSETRON HCL 4 MG PO TABS
4.0000 mg | ORAL_TABLET | Freq: Four times a day (QID) | ORAL | Status: DC | PRN
  Administered 2021-11-26: 22:00:00 4 mg via ORAL
  Filled 2021-11-26: qty 1

## 2021-11-26 MED ORDER — BENZONATATE 100 MG PO CAPS
200.0000 mg | ORAL_CAPSULE | Freq: Three times a day (TID) | ORAL | Status: DC
  Administered 2021-11-26 – 2021-11-28 (×6): 200 mg via ORAL
  Filled 2021-11-26 (×7): qty 2

## 2021-11-26 MED ORDER — ENSURE ENLIVE PO LIQD
237.0000 mL | Freq: Two times a day (BID) | ORAL | Status: DC
  Administered 2021-11-27: 13:00:00 237 mL via ORAL

## 2021-11-26 MED ORDER — ACETAMINOPHEN 650 MG RE SUPP: 650.0000 mg | Freq: Four times a day (QID) | RECTAL | Status: DC | PRN

## 2021-11-26 MED ORDER — ONDANSETRON HCL 4 MG/2ML IJ SOLN
4.0000 mg | Freq: Once | INTRAMUSCULAR | Status: AC
  Administered 2021-11-26: 17:00:00 4 mg via INTRAVENOUS
  Filled 2021-11-26: qty 2

## 2021-11-26 MED ORDER — ENOXAPARIN SODIUM 30 MG/0.3ML IJ SOSY
30.0000 mg | PREFILLED_SYRINGE | INTRAMUSCULAR | Status: DC
  Filled 2021-11-26 (×3): qty 0.3

## 2021-11-26 MED ORDER — BUPRENORPHINE HCL-NALOXONE HCL 8-2 MG SL SUBL: 1.0000 | SUBLINGUAL_TABLET | Freq: Every day | SUBLINGUAL | Status: DC

## 2021-11-26 MED ORDER — ALBUTEROL SULFATE (2.5 MG/3ML) 0.083% IN NEBU
2.5000 mg | INHALATION_SOLUTION | Freq: Four times a day (QID) | RESPIRATORY_TRACT | Status: DC
  Administered 2021-11-26 – 2021-11-27 (×6): 2.5 mg via RESPIRATORY_TRACT
  Filled 2021-11-26 (×6): qty 3

## 2021-11-26 MED ORDER — LEVOFLOXACIN IN D5W 750 MG/150ML IV SOLN
750.0000 mg | Freq: Once | INTRAVENOUS | Status: DC
  Filled 2021-11-26: qty 150

## 2021-11-26 NOTE — ED Notes (Addendum)
Pt declines Azithromycin tablets and Tylenol despite explaining to her she has a fever, states she cannot swallow pills, additionally asked for PO Potassium tablet to be crushed, which this nurse explained to her cannot be done. Additionally pt states, "Hell no!" to Lovenox shot. However, pt was agreeable to Oxycodone and Hydroxyzine tablets. Hospitalist, Dr. Robb Matar notified and aware.

## 2021-11-26 NOTE — ED Notes (Signed)
EDP at the bedside to evaluate.  

## 2021-11-26 NOTE — ED Notes (Signed)
This nurse entered the pt's room to give IV Levaquin. The pt stated, "all antibiotics will kill me unless it's Azithromycin." Theron Arista, PA-C notified and aware.

## 2021-11-26 NOTE — ED Notes (Signed)
This nurse entered the pt's room to give the pt a breathing tx as per requested by the pt. This nurse entered the room to find the pt shouting, "what kind of nasty shit is that?!" While pointing at the stirrups section of the stretcher. Pt's cardiac leads strewn onto floor despite this nurse explaining the importance of being attached to the cardiac monitor to the pt.

## 2021-11-26 NOTE — Progress Notes (Signed)
Pharmacy Antibiotic Note  Michele Lucas is a 55 y.o. female admitted on 11/26/2021 with pneumonia.  Pharmacy has been consulted for levofloxacin dosing.  Patient has a documented use of ceftriaxone previously.  As per protocol, will switch levofloxacin to ceftriaxone and azithromycin.  Plan: Ceftriaxone 1g IV q24h Azithromycin 500mg  PO QD  Pharmacy will sign off of consult and monitor peripherally.  Please re-consult if needed.  Height: 5\' 1"  (154.9 cm) Weight: 42.6 kg (94 lb) IBW/kg (Calculated) : 47.8  Temp (24hrs), Avg:98.1 F (36.7 C), Min:98.1 F (36.7 C), Max:98.1 F (36.7 C)  Recent Labs  Lab 11/26/21 0851  WBC 8.8  CREATININE 0.57    Estimated Creatinine Clearance: 52.2 mL/min (by C-G formula based on SCr of 0.57 mg/dL).    Allergies  Allergen Reactions   Guaifenesin & Derivatives Shortness Of Breath    "Mucinex brand"   Ibuprofen     Upset stomach   Neurontin [Gabapentin] Other (See Comments)    Reported by [facility] - slowed heart rate per pt   Prozac [Fluoxetine Hcl] Other (See Comments)    Loss of appetite, sedation   Tylenol [Acetaminophen]     Upset stomach   Ampicillin Hives and Rash    Has patient had a PCN reaction causing immediate rash, facial/tongue/throat swelling, SOB or lightheadedness with hypotension: Yes Has patient had a PCN reaction causing severe rash involving mucus membranes or skin necrosis: No Has patient had a PCN reaction that required hospitalization: Unknown Has patient had a PCN reaction occurring within the last 10 years: Unknown If all of the above answers are "NO", then may proceed with Cephalosporin use.   Clindamycin/Lincomycin Swelling and Rash    Have these reactions in feet, ankles and legs.   Penicillins Hives and Rash    Antimicrobials this admission: Ceftriaxone 12/19 >>  Azithromycin 12/19 >>   Thank you for allowing pharmacy to be a part of this patients care.  1/20,  PharmD 11/26/2021 12:26 PM

## 2021-11-26 NOTE — ED Notes (Signed)
This nurse notified by multiple members of ED staff that pt has verbally berated them more than once during her current ED visit. This nurse walking by the pt's room, the pt opened exam room door and began yelling into hallway, "I came here not to die and my lungs are clsoing up!" This nurse explained to the pt that she would need to remain attached to the cardiac monitor that she had just detached herself from. This nurse explained to the pt that the hospitalist has been notified that the pt is requesting a breathing tx at this time.

## 2021-11-26 NOTE — ED Provider Notes (Signed)
[facility] DEPT Provider Note   CSN: [id number] Arrival date & time: 11/26/21  0757     History No chief complaint on file.   Michele Lucas is a 55 y.o. female.  HPI  Patient with history of COPD, PTSD, nicotine abuse presents due to shortness of breath.  Patient was in [place] for 2 months, recently returned back to [place].  She ran out of her maintenance medicine while in [place] and has only been using the albuterol inhaler as needed.  She is having worsening shortness of breath that started yesterday, its been continuous despite using the albuterol inhaler at home.  She is coughing more than normal, denies any fever or nasal congestion.  Reports she did have 2 episodes of chest pain, it was a dull aching pain that was in the center of her chest and lasted for few minutes.  With EMS she was given Atrovent, 125 of Solu-Medrol, and albuterol.  She still feels short of breath.  Patient is followed by pulmonology at the [facility].  Her next appointment is January 10.  Past Medical History:  Diagnosis Date   Anxiety    Chronic back pain    COPD, mild ([place])    Emphysema lung ([place])    Endometriosis    Fibromyalgia    Kidney stones    Neuropathic pain    PTSD (post-traumatic stress disorder)     Patient Active Problem List   Diagnosis Date Noted   Protein-calorie malnutrition, severe 04/04/2018   Nicotine abuse 04/04/2018   Acute respiratory failure with hypoxia ([place]) 04/03/2018   Acute bronchitis with COPD ([place]) 04/03/2018   PTSD (post-traumatic stress disorder) 04/03/2018   Neuropathic pain 04/03/2018   Fibromyalgia 04/03/2018   Chronic back pain 04/03/2018   Anxiety 04/03/2018   Thrombocytopenia ([place]) 04/03/2018   Acute hyponatremia 04/03/2018   Elevated lactic acid level 04/03/2018   Protein calorie malnutrition ([place]) 04/03/2018   Pneumothorax on left 10/06/2016   Chronic pain syndrome 04/02/2015   Generalized anxiety disorder 04/02/2015    Hypokalemia 04/02/2015   Normocytic anemia 04/02/2015   Abnormal urinalysis 04/02/2015   SOB (shortness of breath)    CAP (community acquired pneumonia) 04/01/2015   COPD exacerbation ([place]) 04/01/2015    Past Surgical History:  Procedure Laterality Date   CESAREAN SECTION     copolscopy     TONSILLECTOMY     TUBAL LIGATION       OB History   No obstetric history on file.     Family History  Problem Relation Age of Onset   Diabetes type II Neg Hx    Stroke Neg Hx     Social History   Tobacco Use   Smoking status: Every Day    Packs/day: 0.50    Types: Cigarettes   Smokeless tobacco: Current   Tobacco comments:    Previously documented in a pack per day-she now reports 4 to 5 cigarettes/day  Vaping Use   Vaping Use: Never used  Substance Use Topics   Alcohol use: No   Drug use: No    Home Medications Prior to Admission medications   Medication Sig Start Date End Date Taking? Authorizing Provider  albuterol (PROVENTIL HFA;VENTOLIN HFA) 108 (90 Base) MCG/ACT inhaler Inhale 2 puffs into the lungs 5 (five) times daily as needed for wheezing or shortness of breath.     [provider]  benzonatate (TESSALON) 200 MG capsule Take 1 capsule (200 mg total) by mouth 3 (three) times  daily. Patient not taking: Reported on 03/04/2019 04/13/18   Roberto Scales D, MD  budesonide-formoterol [facility]) 160-4.5 MCG/ACT inhaler Inhale 2 puffs into the lungs 2 (two) times daily.    [provider]  buprenorphine-naloxone (SUBOXONE) 8-2 mg SUBL SL tablet Place 1 tablet under the tongue daily.    [provider]  feeding supplement, GLUCERNA SHAKE, (GLUCERNA SHAKE) LIQD Take 237 mLs by mouth 3 (three) times daily between meals. Patient not taking: Reported on 03/04/2019 04/13/18   Roberto Scales D, MD  hydrOXYzine (ATARAX/VISTARIL) 25 MG tablet Take 1 tablet (25 mg total) by mouth every 6 (six) hours. 06/30/20   Arlyn Dunning, PA-C  levofloxacin (LEVAQUIN) 500  MG tablet Take 1 tablet (500 mg total) by mouth daily. Patient not taking: Reported on 04/03/2019 03/05/19   Benjiman Core, MD  methocarbamol (ROBAXIN) 500 MG tablet Take 1 tablet (500 mg total) by mouth 4 (four) times daily. Patient not taking: Reported on 03/04/2019 10/24/18   Cuthriell, Delorise Royals, PA-C  mirtazapine (REMERON) 30 MG tablet Take 30 mg by mouth at bedtime.    [provider]  mometasone-formoterol (DULERA) 200-5 MCG/ACT AERO Inhale 2 puffs into the lungs 2 (two) times daily. Patient not taking: Reported on 04/03/2019 04/13/18   Roberto Scales D, MD  nicotine (NICODERM CQ - DOSED IN MG/24 HOURS) 21 mg/24hr patch Place 1 patch (21 mg total) onto the skin daily. Patient not taking: Reported on 03/04/2019 04/14/18   Roberto Scales D, MD  ondansetron (ZOFRAN ODT) 8 MG disintegrating tablet Take 1 tablet (8 mg total) by mouth every 8 (eight) hours as needed for nausea or vomiting. Patient not taking: Reported on 03/04/2019 01/28/19   Linwood Dibbles, MD  predniSONE (DELTASONE) 10 MG tablet Take 1 tablet (10 mg total) by mouth daily. Patient not taking: Reported on 03/04/2019 10/24/18   Cuthriell, Delorise Royals, PA-C    Allergies    Guaifenesin & derivatives, Ibuprofen, Neurontin [gabapentin], Tylenol [acetaminophen], Ampicillin, and Clindamycin/lincomycin  Review of Systems   Review of Systems  Constitutional:  Negative for chills and fever.  HENT:  Negative for ear pain and sore throat.   Eyes:  Negative for pain and visual disturbance.  Respiratory:  Positive for cough, shortness of breath and wheezing.   Cardiovascular:  Positive for chest pain. Negative for palpitations.  Gastrointestinal:  Negative for abdominal pain and vomiting.  Genitourinary:  Negative for dysuria and hematuria.  Skin:  Negative for color change and rash.  Neurological:  Negative for seizures and syncope.  All other systems reviewed and are negative.  Physical Exam Updated Vital Signs BP 134/75    Pulse  (!) 11    Temp 98.1 F (36.7 C)    Resp (!) 28    SpO2 96%   Physical Exam Vitals and nursing note reviewed. Exam conducted with a chaperone present.  Constitutional:      Appearance: Normal appearance.  HENT:     Head: Normocephalic and atraumatic.  Eyes:     General: No scleral icterus.       Right eye: No discharge.        Left eye: No discharge.     Extraocular Movements: Extraocular movements intact.     Pupils: Pupils are equal, round, and reactive to light.  Cardiovascular:     Rate and Rhythm: Regular rhythm. Tachycardia present.     Pulses: Normal pulses.     Heart sounds: Normal heart sounds. No murmur heard.   No friction rub.  No gallop.  Pulmonary:     Effort: Pulmonary effort is normal. Tachypnea present.     Breath sounds: Wheezing present.  Abdominal:     General: Abdomen is flat. Bowel sounds are normal. There is no distension.     Palpations: Abdomen is soft.     Tenderness: There is no abdominal tenderness.  Skin:    General: Skin is warm and dry.     Coloration: Skin is not jaundiced.  Neurological:     Mental Status: She is alert. Mental status is at baseline.     Coordination: Coordination normal.   ED Results / Procedures / Treatments   Labs (all labs ordered are listed, but only abnormal results are displayed) Labs Reviewed  RESP PANEL BY RT-PCR (FLU A&B, COVID) [id number]  BASIC METABOLIC PANEL  CBC WITH DIFFERENTIAL/PLATELET  TROPONIN I (HIGH SENSITIVITY)    EKG None  Radiology No results found.  Procedures Procedures   Medications Ordered in ED Medications  magnesium sulfate IVPB 2 g 50 mL (has no administration in time range)  ipratropium-albuterol (DUONEB) 0.5-2.5 (3) MG/3ML nebulizer solution 3 mL (has no administration in time range)    ED Course  I have reviewed the triage vital signs and the nursing notes.  Pertinent labs & imaging results that were available during my care of the patient were reviewed by me and considered in  my medical decision making (see chart for details).    MDM Rules/Calculators/A&P                         Patient presents with tachycardia and slight tachypnea.  Not hypoxic, lungs with bilateral expiratory and inspiratory wheezing.  Given medicine by EMS, will give mag and another DuoNeb.  PE is a consideration, also could just be an exacerbation of COPD.  Will get chest x-ray and labs to start.    Radiograph noted for left lower lobe pneumonia, patient has allergy to ceftriaxone per chart review.  Will give Levaquin.  I think her symptoms are more likely secondary to pneumonia than active PE.  Initial troponin negative also reassuring.  She remains slightly tachycardic, probably due to the DuoNeb.  Will consult with hospitalist this patient needs admission for pneumonia.  Patient is declining the Levaquin, states that "all antibiotics will kill me except for azithromycin".  Dr. Olevia Bowens is agreeable to admission.  Appreciate his assistance in care of this patient.     Final Clinical Impression(s) / ED Diagnoses Final diagnoses:  None    Rx / DC Orders ED Discharge Orders     None        Sherrill Raring, [place] 11/26/21 1239    Pattricia Boss, MD 12/04/21 [phone]

## 2021-11-26 NOTE — ED Notes (Signed)
Pt in CT.

## 2021-11-26 NOTE — ED Notes (Signed)
Hospitalist at the bedside 

## 2021-11-26 NOTE — ED Notes (Signed)
Breathing tx in progress at this time. Pt re-attached to cardiac monitor x3 and this nurse re-explained the importance of remaining attached to the cardiac monitor, to the pt.

## 2021-11-26 NOTE — ED Triage Notes (Signed)
Pt coming from home via EMS. C/o SOB since yesterday. Hx COPD. Pt received one neb tx at 0230 via EMS, but did decided not to come to the hospital at that time. Pt was still having SOB this morning so she called again at 0730 and came to the hospital.

## 2021-11-26 NOTE — H&P (Signed)
History and Physical    Michele BariCarla Templeman [id number] DOB: 07/26/1966 DOA: 11/26/2021  PCP: Clare GandyEdelman, Harlei Lehrmann, MD   Patient coming from: Home.  I have personally briefly reviewed patient's old medical [facility]  Chief Complaint: Shortness of breath.  HPI: Michele Lucas is a 55 y.o. female with medical history significant of anxiety, chronic back pain, lung emphysema, endometriosis, fibromyalgia, urolithiasis, neuropathic pain, PTSD, tobacco use who is coming to the emergency department due to progressively worse dyspnea associated with fever, fatigue, productive cough and pleuritic chest pain since yesterday.  [place] .  While she was there she ran out of her LABA inhaler and has only been using her albuterol MDI. Rhinorrhea, sore throat, hemoptysis, palpitations, diaphoresis, PND, orthopnea or pitting edema of the lower extremities.  No abdominal pain, nausea, emesis, diarrhea, melena or hematochezia.  Denies dysuria, frequency or hematuria.  No polyuria, polydipsia, polyphagia or blurred vision.  ED Course: Initial vital signs were temperature 98.1 F, pulse 122, respirations 29, BP 134/75 mmHg O2 sat 96% on room air.  The patient received a neb treatment by EMS.  He received a DuoNeb, magnesium sulfate 2 g IVPB and Levaquin in the emergency department.  Lab work: CBC showed a white count of 8.8 with 80% neutrophils, hemoglobin 14.2 g/dL platelets [id number].  Troponin x2 was normal.  Coronavirus and influenza PCR was negative.  BMP with a glucose of 108 mg/dL but was otherwise unremarkable.  Imaging: 2 view chest radiograph showed patchy/nodular airspace opacity in the lower left lung, suspicious for pneumonia.  Imaging follow-up was recommended to ensure resolution.  Review of Systems: As per HPI otherwise all other systems reviewed and are negative.  Past Medical History:  Diagnosis Date   Anxiety    Chronic back pain    COPD, mild  (HCC)    Emphysema lung (HCC)    Endometriosis    Fibromyalgia    Kidney stones    Neuropathic pain    PTSD (post-traumatic stress disorder)     Past Surgical History:  Procedure Laterality Date   CESAREAN SECTION     copolscopy     TONSILLECTOMY     TUBAL LIGATION      Social History  reports that she quit smoking yesterday. Her smoking use included cigarettes. She smoked an average of .5 packs per day. She uses smokeless tobacco. She reports that she does not drink alcohol and does not use drugs.  Allergies  Allergen Reactions   Guaifenesin & Derivatives Shortness Of Breath    "Mucinex brand"   Ibuprofen     Upset stomach   Neurontin [Gabapentin] Other (See Comments)    [facility] - slowed heart rate per pt   Prozac [Fluoxetine Hcl] Other (See Comments)    Loss of appetite, sedation   Tylenol [Acetaminophen]     Upset stomach   Ampicillin Hives and Rash    Has patient had a PCN reaction causing immediate rash, facial/tongue/throat swelling, SOB or lightheadedness with hypotension: Yes Has patient had a PCN reaction causing severe rash involving mucus membranes or skin necrosis: No Has patient had a PCN reaction that required hospitalization: Unknown Has patient had a PCN reaction occurring within the last 10 years: Unknown If all of the above answers are "NO", then may proceed with Cephalosporin use.   Clindamycin/Lincomycin Swelling and Rash    Have these reactions in feet, ankles and legs.   Penicillins Hives  and Rash    Family History  Problem Relation Age of Onset   Diabetes type II Neg Hx    Stroke Neg Hx    Prior to Admission medications   Medication Sig Start Date End Date Taking? Authorizing Provider  albuterol (PROVENTIL HFA;VENTOLIN HFA) 108 (90 Base) MCG/ACT inhaler Inhale 2 puffs into the lungs 5 (five) times daily as needed for wheezing or shortness of breath.    Yes [provider]  benzonatate (TESSALON) 200 MG  capsule Take 1 capsule (200 mg total) by mouth 3 (three) times daily. 04/13/18  Yes Roberto Scales D, MD  feeding supplement, GLUCERNA SHAKE, (GLUCERNA SHAKE) LIQD Take 237 mLs by mouth 3 (three) times daily between meals. Patient taking differently: Take 237 mLs by mouth 3 (three) times daily between meals. Chocolate (only) 04/13/18  Yes Roberto Scales D, MD  budesonide-formoterol (SYMBICORT) 160-4.5 MCG/ACT inhaler Inhale 2 puffs into the lungs 2 (two) times daily.    [provider]    Physical Exam: Vitals:   11/26/21 0824 11/26/21 0825 11/26/21 0916 11/26/21 0945  BP:    (!) 142/70  Pulse: (!) 119 (!) 116 (!) 123   Resp: (!) 21  (!) 26 (!) 30  Temp:      SpO2: 97% 96% 100%   Weight:      Height:        Constitutional: NAD, calm, comfortable Eyes: PERRL, lids and conjunctivae normal.  Injected sclera bilaterally. ENMT: Mucous membranes are moist. Posterior pharynx clear of any exudate or lesions. Neck: normal, supple, no masses, no thyromegaly Respiratory: Mildly decreased breath sounds with bilateral rhonchi and wheezing, no crackles. Normal respiratory effort. No accessory muscle use.  Cardiovascular: Regular rate and rhythm in the 80s and 90s, no murmurs / rubs / gallops. No extremity edema. 2+ pedal pulses. No carotid bruits.  Abdomen: no tenderness, no masses palpated. No hepatosplenomegaly. Bowel sounds positive.  Musculoskeletal: no clubbing / cyanosis.  Good ROM, no contractures. Normal muscle tone.  Skin: no acute rashes, lesions, ulcers on very limited dermatological examination. Neurologic: CN 2-12 grossly intact. Sensation intact, DTR normal. Strength 5/5 in all 4.  Psychiatric: Normal judgment and insight. Alert and oriented x 3. Normal mood.   Labs on Admission: I have personally reviewed following labs and imaging studies  CBC: Recent Labs  Lab 11/26/21 0851  WBC 8.8  NEUTROABS 7.0  HGB 14.2  HCT 45.5  MCV 102.2*  PLT 118*    Basic Metabolic  Panel: Recent Labs  Lab 11/26/21 0851  NA 141  K 4.0  CL 104  CO2 28  GLUCOSE 108*  BUN 11  CREATININE 0.57  CALCIUM 9.5    GFR: Estimated Creatinine Clearance: 52.2 mL/min (by C-G formula based on SCr of 0.57 mg/dL).  Liver Function Tests: No results for input(s): AST, ALT, ALKPHOS, BILITOT, PROT, ALBUMIN in the last 168 hours.  Radiological Exams on Admission: DG Chest 2 View  Result Date: 11/26/2021 CLINICAL DATA:  Shortness of breath. EXAM: CHEST - 2 VIEW COMPARISON:  06/30/2020 FINDINGS: Lungs are hyperexpanded consistent with emphysema. Patchy/nodular airspace opacity is seen in the lower left lung. Right lung clear. The cardiopericardial silhouette is within normal limits for size. The visualized bony structures of the thorax show no acute abnormality. IMPRESSION: Patchy/nodular airspace opacity in the lower left lung, suspicious for pneumonia. Given the somewhat nodular appearance, close follow-up recommended to ensure resolution. Electronically Signed   By: Kennith Center M.D.   On: 11/26/2021 08:42  04/07/2018 echocardiogram. -------------------------------------------------------------------  LV EF: 55% -   60%   -------------------------------------------------------------------  Indications:      Dyspnea 786.09.   -------------------------------------------------------------------  History:   PMH:  Acute respiratory failure  Dyspnea.  Chronic  obstructive pulmonary disease.   -------------------------------------------------------------------  Study Conclusions   - Left ventricle: The cavity size was normal. Wall thickness was    normal. Systolic function was normal. The estimated ejection    fraction was in the range of 55% to 60%. Wall motion was normal;    there were no regional wall motion abnormalities. Doppler    parameters are consistent with abnormal left ventricular    relaxation (grade 1 diastolic dysfunction).  - Aortic valve: There was moderate  stenosis. There was moderate    regurgitation.  - Mitral valve: There was mild regurgitation.   EKG: Independently reviewed.  Vent. rate 120 BPM PR interval 111 ms QRS duration 71 ms QT/QTcB 325/460 ms P-R-T axes 83 77 62 Sinus tachycardia Consider right atrial enlargement Left ventricular hypertrophy  Assessment/Plan Principal Problem:   COPD with acute exacerbation (HCC) Observation/telemetry. Supplemental oxygen as needed. Bronchodilators as needed. Received Solu-Medrol 125 mg IVP by EMS. Magnesium sulfate 2 g IVPB. Continue Begin prednisone 40 mg p.o. daily tomorrow.  Active Problems:   CAP (community acquired pneumonia) The patient declined Levaquin and ceftriaxone. The patient declined oral azithromycin. Azithromycin 500 mg IVPB ordered. Continue treatment for COPD exacerbation.    Grade I diastolic dysfunction No signs of decompensation. Follow-up with PCP and cardiology other twice daily.    Thrombocytopenia (HCC) Monitor platelet count.    Nicotine abuse Nicotine replacement therapy ordered.    Underweight Has lost about 10 pounds since her husband died 2 years ago. May benefit from nutritional supplements and nutritional services evaluation. Follow[facility] PCP at the [facility].    Chronic pain syndrome Continue Suboxone. Patient was wanting oxycodone 20 mg as needed. She was told that this was not in her medication profile.    DVT prophylaxis: Lovenox SQ. Code Status:   Full code. Family Communication:   Disposition Plan:   Patient is from:  Home.  Anticipated DC to:  Home.  Anticipated DC date:  11/27/2021 or 11/28/2021.  Anticipated DC barriers: Clinical status.  Consults called:   Admission status:  Observation/telemetry.    Severity of Illness:High severity after presenting with COPD exacerbation along with left lower base infiltrate.  The patient will be observed for supplemental oxygen, bronchodilators, glucocorticoids and IV antibiotic  therapy.  Reubin Milan MD [facility]  How to contact the [facility] Attending or Consulting provider [place] or covering provider during after hours [place], for this patient?   Check the care team in [facility] and look for a) attending/consulting TR[facility] provider listed and b) the [facility] team listed Log into [email or web address] and use [place] universal password to access. If you do not have the password, please contact the hospital operator. Locate the [facility] provider you are looking for under [facility] and page to a number that you can be directly reached. If you still have difficulty reaching the provider, please page the [facility] (Director on Call) for the Hospitalists listed on amion for assistance.  11/26/2021, 12:14 PM   This document was prepared using Dragon voice recognition software and may contain some unintended transcription errors.

## 2021-11-27 DIAGNOSIS — E440 Moderate protein-calorie malnutrition: Secondary | ICD-10-CM | POA: Insufficient documentation

## 2021-11-27 DIAGNOSIS — I5189 Other ill-defined heart diseases: Secondary | ICD-10-CM | POA: Diagnosis not present

## 2021-11-27 DIAGNOSIS — J441 Chronic obstructive pulmonary disease with (acute) exacerbation: Secondary | ICD-10-CM | POA: Diagnosis not present

## 2021-11-27 DIAGNOSIS — J189 Pneumonia, unspecified organism: Principal | ICD-10-CM

## 2021-11-27 LAB — STREP PNEUMONIAE URINARY ANTIGEN: Strep Pneumo Urinary Antigen: NEGATIVE

## 2021-11-27 LAB — CBC
HCT: 39.5 % (ref 36.0–46.0)
Hemoglobin: 12.2 g/dL (ref 12.0–15.0)
MCH: 32.2 pg (ref 26.0–34.0)
MCHC: 30.9 g/dL (ref 30.0–36.0)
MCV: 104.2 fL — ABNORMAL HIGH (ref 80.0–100.0)
Platelets: 103 10*3/uL — ABNORMAL LOW (ref 150–400)
RBC: 3.79 MIL/uL — ABNORMAL LOW (ref 3.87–5.11)
RDW: 12.3 % (ref 11.5–15.5)
WBC: 9.8 10*3/uL (ref 4.0–10.5)
nRBC: 0 % (ref 0.0–0.2)

## 2021-11-27 LAB — HIV ANTIBODY (ROUTINE TESTING W REFLEX): HIV Screen 4th Generation wRfx: NONREACTIVE

## 2021-11-27 MED ORDER — OLANZAPINE 2.5 MG PO TABS
2.5000 mg | ORAL_TABLET | Freq: Every day | ORAL | Status: DC
  Administered 2021-11-27: 20:00:00 2.5 mg via ORAL
  Filled 2021-11-27: qty 1

## 2021-11-27 MED ORDER — SERTRALINE HCL 100 MG PO TABS: 100.0000 mg | ORAL_TABLET | Freq: Every day | ORAL | Status: DC

## 2021-11-27 MED ORDER — BENZONATATE 100 MG PO CAPS
100.0000 mg | ORAL_CAPSULE | Freq: Once | ORAL | Status: AC
  Administered 2021-11-27: 02:00:00 100 mg via ORAL
  Filled 2021-11-27: qty 1

## 2021-11-27 MED ORDER — ADULT MULTIVITAMIN LIQUID CH
15.0000 mL | Freq: Every day | ORAL | Status: DC
  Administered 2021-11-27: 15 mL via ORAL
  Filled 2021-11-27 (×2): qty 15

## 2021-11-27 MED ORDER — BOOST PLUS PO LIQD
237.0000 mL | Freq: Two times a day (BID) | ORAL | Status: DC | PRN
  Filled 2021-11-27: qty 237

## 2021-11-27 MED ORDER — BOOST PLUS PO LIQD
237.0000 mL | Freq: Three times a day (TID) | ORAL | Status: DC
  Administered 2021-11-27 – 2021-11-28 (×4): 237 mL via ORAL
  Filled 2021-11-27 (×5): qty 237

## 2021-11-27 MED ORDER — SALINE SPRAY 0.65 % NA SOLN
1.0000 | NASAL | Status: DC | PRN
  Administered 2021-11-27: 12:00:00 1 via NASAL
  Filled 2021-11-27: qty 44

## 2021-11-27 MED ORDER — BUSPIRONE HCL 5 MG PO TABS
5.0000 mg | ORAL_TABLET | Freq: Two times a day (BID) | ORAL | Status: DC
  Administered 2021-11-27 – 2021-11-28 (×3): 5 mg via ORAL
  Filled 2021-11-27 (×3): qty 1

## 2021-11-27 NOTE — Plan of Care (Signed)
  Problem: Education: Goal: Knowledge of General Education information will improve Description Including pain rating scale, medication(s)/side effects and non-pharmacologic comfort measures Outcome: Progressing   

## 2021-11-27 NOTE — Plan of Care (Signed)

## 2021-11-27 NOTE — Progress Notes (Signed)
PROGRESS NOTE    Hula Tasso  GGY:[id number] DOB: 05/24/1966 DOA: 11/26/2021 PCP: Clare Gandy, MD    Brief Narrative:  55 y.o. female with medical history significant of anxiety, chronic back pain, lung emphysema, endometriosis, fibromyalgia, urolithiasis, neuropathic pain, PTSD, tobacco use who is coming to the emergency department due to progressively worse dyspnea associated with fever, fatigue, productive cough and pleuritic chest pain since one day prior to admit.  She recently returned from [facility] to [place].  While she was there she ran out of her LABA inhaler and has only been using her albuterol MDI. Rhinorrhea, sore throat, hemoptysis, palpitations, diaphoresis, PND, orthopnea or pitting edema of the lower extremities.  No abdominal pain, nausea, emesis, diarrhea, melena or hematochezia.  Denies dysuria, frequency or hematuria.  No polyuria, polydipsia, polyphagia or blurred vision.  Assessment & Plan:   Principal Problem:   COPD with acute exacerbation (HCC) Active Problems:   CAP (community acquired pneumonia)   Thrombocytopenia (HCC)   Nicotine abuse   Underweight   Grade I diastolic dysfunction   Malnutrition of moderate degree  Principal Problem:   COPD with acute exacerbation (HCC) Required up to [facility] earlier this visit Cont scheduled and PRN bronchodilators Magnesium sulfate 2 g IVPB. Was given solumedrol in ED. Currently on prednisone 40mg  qd Weaned to [facility], still wheezing. Cont to wean O2 to goal of RA, needing 4-5L at night at baseline   Active Problems:   CAP (community acquired pneumonia) Pt reportedly declined Levaquin and ceftriaxone, and oral azithromycin. Currently continued on Azithromycin 500 mg IVPB      Grade I diastolic dysfunction Appears compensated. No LE edema Recommend close f/u as outpatient when discharged     Thrombocytopenia (HCC) Plts of 103 this AM, no evidence of bleed     Nicotine abuse Nicotine replacement therapy  ordered.     Underweight Has lost about 10 pounds since her husband died 2 years ago. May benefit from nutritional supplements and nutritional services evaluation. [facility]     Chronic pain syndrome Continue Suboxone. Patient was wanting oxycodone 20 mg as needed on admit No oxycodone listed on controlled substance registry, but pt has been prescribed suboxone chronically Admitting physician has already ordered PRN oxycodone   Anxiety -Pt noted to be very tearful this AM -On PRN vistaril -Have added low dose buspar  DVT prophylaxis: Lovenox subq Code Status: Full Family Communication: Pt in room, family at bedside  Status is: Observation  The patient remains OBS appropriate and will d/c before 2 midnights.      Consultants:    Procedures:    Antimicrobials: Anti-infectives (From admission, onward)    Start     Dose/Rate Route Frequency Ordered Stop   11/26/21 1930  azithromycin (ZITHROMAX) 500 mg in sodium chloride 0.9 % 250 mL IVPB        500 mg 250 mL/hr over 60 Minutes Intravenous Every 24 hours 11/26/21 1816     11/26/21 1230  cefTRIAXone (ROCEPHIN) 1 g in sodium chloride 0.9 % 100 mL IVPB  Status:  Discontinued        1 g 200 mL/hr over 30 Minutes Intravenous Every 24 hours 11/26/21 1222 11/26/21 1326   11/26/21 1230  azithromycin (ZITHROMAX) tablet 500 mg  Status:  Discontinued        500 mg Oral Daily 11/26/21 1222 11/26/21 1816   11/26/21 0945  levofloxacin (LEVAQUIN) IVPB 750 mg  Status:  Discontinued  750 mg 100 mL/hr over 90 Minutes Intravenous  Once 11/26/21 0936 11/26/21 1219       Subjective: Still feeling SOB, anxious. Staff reports pt being tearful this AM  Objective: Vitals:   11/27/21 0359 11/27/21 0804 11/27/21 1128 11/27/21 1228  BP: 103/70   115/76  Pulse: 89   (!) 101  Resp: 18   18  Temp: 98.7 F (37.1 C)   98.2 F (36.8 C)  TempSrc: Oral   Oral  SpO2: 100% 96% 90% 98%  Weight:      Height:         Intake/Output Summary (Last 24 hours) at 11/27/2021 1628 Last data filed at 11/27/2021 0900 Gross per 24 hour  Intake 970 ml  Output --  Net 970 ml   Filed Weights   11/26/21 0813  Weight: 42.6 kg    Examination: General exam: Awake, laying in bed, in nad Respiratory system: Normal respiratory effort, wheezing Cardiovascular system: regular rate, s1, s2 Gastrointestinal system: Soft, nondistended, positive BS Central nervous system: CN2-12 grossly intact, strength intact Extremities: Perfused, no clubbing Skin: Normal skin turgor, no notable skin lesions seen Psychiatry: Mood normal // no visual hallucinations   Data Reviewed: I have personally reviewed following labs and imaging studies  CBC: Recent Labs  Lab 11/26/21 0851 11/27/21 0506  WBC 8.8 9.8  NEUTROABS 7.0  --   HGB 14.2 12.2  HCT 45.5 39.5  MCV 102.2* 104.2*  PLT 118* [id number]*   Basic Metabolic Panel: Recent Labs  Lab 11/26/21 0851  NA 141  K 4.0  CL 104  CO2 28  GLUCOSE 108*  BUN 11  CREATININE 0.57  CALCIUM 9.5   GFR: Estimated Creatinine Clearance: 52.2 mL/min (by C-G formula based on SCr of 0.57 mg/dL). Liver Function Tests: Recent Labs  Lab 11/26/21 1130  AST 24  ALT 22  ALKPHOS 45  BILITOT 0.8  PROT 7.6  ALBUMIN 4.1   No results for input(s): LIPASE, AMYLASE in the last 168 hours. No results for input(s): AMMONIA in the last 168 hours. Coagulation Profile: No results for input(s): INR, PROTIME in the last 168 hours. Cardiac Enzymes: No results for input(s): CKTOTAL, CKMB, CKMBINDEX, TROPONINI in the last 168 hours. BNP (last 3 results) No results for input(s): PROBNP in the last 8760 hours. HbA1C: No results for input(s): HGBA1C in the last 72 hours. CBG: No results for input(s): GLUCAP in the last 168 hours. Lipid Profile: No results for input(s): CHOL, HDL, LDLCALC, TRIG, CHOLHDL, LDLDIRECT in the last 72 hours. Thyroid Function Tests: No results for input(s): TSH,  T4TOTAL, FREET4, T3FREE, THYROIDAB in the last 72 hours. Anemia Panel: No results for input(s): VITAMINB12, FOLATE, FERRITIN, TIBC, IRON, RETICCTPCT in the last 72 hours. Sepsis Labs: No results for input(s): PROCALCITON, LATICACIDVEN in the last 168 hours.  Recent Results (from the past 240 hour(s))  Resp Panel by RT-PCR (Flu A&B, Covid) Nasopharyngeal Swab     Status: None   Collection Time: 11/26/21  8:51 AM   Specimen: Nasopharyngeal Swab; Nasopharyngeal(NP) swabs in vial transport medium  Result Value Ref Range Status   SARS Coronavirus 2 by RT PCR NEGATIVE NEGATIVE Final    Comment: (NOTE) SARS-CoV-2 target nucleic acids are NOT DETECTED.  The SARS-CoV-2 RNA is generally detectable in upper respiratory specimens during the acute phase of infection. The lowest concentration of SARS-CoV-2 viral copies this assay can detect is 138 copies/mL. A negative result does not preclude SARS-Cov-2 infection and should not be used as  the sole basis for treatment or other patient management decisions. A negative result may occur with  improper specimen collection/handling, submission of specimen other than nasopharyngeal swab, presence of viral mutation(s) within the areas targeted by this assay, and inadequate number of viral copies(<138 copies/mL). A negative result must be combined with clinical observations, patient history, and epidemiological information. The expected result is Negative.  Fact Sheet for Patients:  EntrepreneurPulse.com.au  Fact Sheet for Healthcare Providers:  IncredibleEmployment.be  This test is no t yet approved or cleared by the [place] FDA and  has been authorized for detection and/or diagnosis of SARS-CoV-2 by FDA under an Emergency Use Authorization (EUA). This EUA will remain  in effect (meaning this test can be used) for the duration of the COVID-19 declaration under Section 564(b)(1) of the Act, 21 U.S.C.section  360bbb-3(b)(1), unless the authorization is terminated  or revoked sooner.       Influenza A by PCR NEGATIVE NEGATIVE Final   Influenza B by PCR NEGATIVE NEGATIVE Final    Comment: (NOTE) The Xpert Xpress SARS-CoV-2/FLU/RSV plus assay is intended as an aid in the diagnosis of influenza from Nasopharyngeal swab specimens and should not be used as a sole basis for treatment. Nasal washings and aspirates are unacceptable for Xpert Xpress SARS-CoV-2/FLU/RSV testing.  Fact Sheet for Patients: EntrepreneurPulse.com.au  Fact Sheet for Healthcare Providers: IncredibleEmployment.be  This test is not yet approved or cleared by the [place] FDA and has been authorized for detection and/or diagnosis of SARS-CoV-2 by FDA under an Emergency Use Authorization (EUA). This EUA will remain in effect (meaning this test can be used) for the duration of the COVID-19 declaration under Section 564(b)(1) of the Act, 21 U.S.C. section 360bbb-3(b)(1), unless the authorization is terminated or revoked.  Performed at [facility], [place] [address] [place], [place] [address]      Radiology Studies: DG Chest 2 View  Result Date: 11/26/2021 CLINICAL DATA:  Shortness of breath. EXAM: CHEST - 2 VIEW COMPARISON:  06/30/2020 FINDINGS: Lungs are hyperexpanded consistent with emphysema. Patchy/nodular airspace opacity is seen in the lower left lung. Right lung clear. The cardiopericardial silhouette is within normal limits for size. The visualized bony structures of the thorax show no acute abnormality. IMPRESSION: Patchy/nodular airspace opacity in the lower left lung, suspicious for pneumonia. Given the somewhat nodular appearance, close follow-up recommended to ensure resolution. Electronically Signed   By: Misty Stanley M.D.   On: 11/26/2021 08:42    Scheduled Meds:  albuterol  2.5 mg Nebulization QID   benzonatate  200 mg Oral TID   busPIRone  5 mg  Oral BID   enoxaparin (LOVENOX) injection  30 mg Subcutaneous Q24H   lactose free nutrition  237 mL Oral TID WC & HS   multivitamin  15 mL Oral Daily   OLANZapine  2.5 mg Oral QHS   predniSONE  40 mg Oral Q breakfast   Continuous Infusions:  azithromycin 500 mg (11/26/21 2041)     LOS: 0 days   Marylu Lund, MD [facility] Pager On Amion  If 7PM-7AM, please contact night-coverage 11/27/2021, 4:28 PM

## 2021-11-27 NOTE — Progress Notes (Signed)
PT demonstrates hands on understanding of Flutter device. 

## 2021-11-27 NOTE — Progress Notes (Signed)
Initial Nutrition Assessment  DOCUMENTATION CODES:   Non-severe (moderate) malnutrition in context of chronic illness, Underweight  INTERVENTION:   -Boost Plus QID- Each supplement provides 360kcal and 14g protein.    -Also PRN if desires more than 4/day.  -D/c Ensure -pt does not like them  -Multivitamin with minerals daily -liquid d/t difficulty swallowing pills  NUTRITION DIAGNOSIS:   Moderate Malnutrition related to chronic illness as evidenced by severe muscle depletion, mild fat depletion.  GOAL:   Patient will meet greater than or equal to 90% of their needs  MONITOR:   PO intake, Supplement acceptance, Labs, Weight trends, I & O's  REASON FOR ASSESSMENT:   Consult Assessment of nutrition requirement/status  ASSESSMENT:   55 y.o. female with medical history significant of anxiety, chronic back pain, lung emphysema, endometriosis, fibromyalgia, urolithiasis, neuropathic pain, PTSD, tobacco use who is coming to the emergency department due to progressively worse dyspnea associated with fever, fatigue, productive cough and pleuritic chest pain since yesterday.  Patient in room with daughter at bedside. Pt reports she has had appetite changes since the loss of her husband 2 years ago. Pt states she was not a good state until she moved to [place] to live with her son and daughter-in-law. They provided 3 meals a day for pt and encouraged her to eat. She would drink 4-6 Boosts daily in addition, she states this helped her a lot. She returned to [place] 1.5 weeks ago, she developed pneumonia, began to eat less d/t increased SOB.   Per weight records, pt weighed 100 lbs on 06/30/20. Current weight: 94 lbs. Not ideal given history of malnutrition.  Medications reviewed.  Labs reviewed.  NUTRITION - FOCUSED PHYSICAL EXAM:  Flowsheet Row Most Recent Value  Orbital Region No depletion  Upper Arm Region Moderate depletion  Thoracic and Lumbar Region No depletion  Buccal Region  Mild depletion  Temple Region Mild depletion  Clavicle Bone Region Severe depletion  Clavicle and Acromion Bone Region Severe depletion  Scapular Bone Region Severe depletion  Dorsal Hand Moderate depletion  Patellar Region Unable to assess  Anterior Thigh Region Unable to assess  Posterior Calf Region Unable to assess  Edema (RD Assessment) None  Hair Unable to assess  [in head wrap]  Eyes Reviewed  Mouth Reviewed  [missing teeth]  Skin Reviewed       Diet Order:   Diet Order             Diet Heart Room service appropriate? Yes; Fluid consistency: Thin  Diet effective now                   EDUCATION NEEDS:   Education needs have been addressed  Skin:  Skin Assessment: Reviewed RN Assessment  Last BM:  12/18  Height:   Ht Readings from Last 1 Encounters:  11/26/21 5\' 1"  (1.549 m)    Weight:   Wt Readings from Last 1 Encounters:  11/26/21 42.6 kg    BMI:  Body mass index is 17.76 kg/m.  Estimated Nutritional Needs:   Kcal:  1700-1900  Protein:  75-95g  Fluid:  1.9L/day  11/28/21, MS, RD, LDN Inpatient Clinical Dietitian Contact information available via Amion

## 2021-11-27 NOTE — Evaluation (Addendum)
SLP Cancellation Note  Patient Details Name: Graceyn Fodor MRN: [id number] DOB: 1965-12-25   Cancelled treatment:       Reason Eval/Treat Not Completed: Other (comment) (pt working with RT at this time, will continue efforts)  Nurse tech denies noticing pt having dysphagia.   Rolena Infante, MS [facility] SLP Acute Rehab Services Office [phone] Pager [phone]   Chales Abrahams 11/27/2021, 11:24 AM

## 2021-11-28 DIAGNOSIS — Z7951 Long term (current) use of inhaled steroids: Secondary | ICD-10-CM | POA: Diagnosis not present

## 2021-11-28 DIAGNOSIS — J209 Acute bronchitis, unspecified: Secondary | ICD-10-CM | POA: Diagnosis present

## 2021-11-28 DIAGNOSIS — F4310 Post-traumatic stress disorder, unspecified: Secondary | ICD-10-CM | POA: Diagnosis present

## 2021-11-28 DIAGNOSIS — Z880 Allergy status to penicillin: Secondary | ICD-10-CM | POA: Diagnosis not present

## 2021-11-28 DIAGNOSIS — F17210 Nicotine dependence, cigarettes, uncomplicated: Secondary | ICD-10-CM | POA: Diagnosis present

## 2021-11-28 DIAGNOSIS — E440 Moderate protein-calorie malnutrition: Secondary | ICD-10-CM | POA: Diagnosis present

## 2021-11-28 DIAGNOSIS — J441 Chronic obstructive pulmonary disease with (acute) exacerbation: Secondary | ICD-10-CM | POA: Diagnosis present

## 2021-11-28 DIAGNOSIS — Z79899 Other long term (current) drug therapy: Secondary | ICD-10-CM | POA: Diagnosis not present

## 2021-11-28 DIAGNOSIS — J439 Emphysema, unspecified: Secondary | ICD-10-CM | POA: Diagnosis present

## 2021-11-28 DIAGNOSIS — J189 Pneumonia, unspecified organism: Secondary | ICD-10-CM | POA: Diagnosis present

## 2021-11-28 DIAGNOSIS — D696 Thrombocytopenia, unspecified: Secondary | ICD-10-CM | POA: Diagnosis present

## 2021-11-28 DIAGNOSIS — J9601 Acute respiratory failure with hypoxia: Secondary | ICD-10-CM | POA: Diagnosis present

## 2021-11-28 DIAGNOSIS — Z681 Body mass index (BMI) 19 or less, adult: Secondary | ICD-10-CM | POA: Diagnosis not present

## 2021-11-28 DIAGNOSIS — Z20822 Contact with and (suspected) exposure to covid-19: Secondary | ICD-10-CM | POA: Diagnosis present

## 2021-11-28 DIAGNOSIS — Z888 Allergy status to other drugs, medicaments and biological substances status: Secondary | ICD-10-CM | POA: Diagnosis not present

## 2021-11-28 DIAGNOSIS — G894 Chronic pain syndrome: Secondary | ICD-10-CM | POA: Diagnosis present

## 2021-11-28 DIAGNOSIS — M797 Fibromyalgia: Secondary | ICD-10-CM | POA: Diagnosis present

## 2021-11-28 LAB — COMPREHENSIVE METABOLIC PANEL
ALT: 16 U/L (ref 0–44)
AST: 17 U/L (ref 15–41)
Albumin: 3.5 g/dL (ref 3.5–5.0)
Alkaline Phosphatase: 37 U/L — ABNORMAL LOW (ref 38–126)
Anion gap: 6 (ref 5–15)
BUN: 20 mg/dL (ref 6–20)
CO2: 31 mmol/L (ref 22–32)
Calcium: 9.3 mg/dL (ref 8.9–10.3)
Chloride: 103 mmol/L (ref 98–111)
Creatinine, Ser: 0.67 mg/dL (ref 0.44–1.00)
GFR, Estimated: >60 mL/min (ref >60)
Glucose, Bld: 87 mg/dL (ref 70–99)
Potassium: 4.2 mmol/L (ref 3.5–5.1)
Sodium: 140 mmol/L (ref 135–145)
Total Bilirubin: 1 mg/dL (ref 0.3–1.2)
Total Protein: 6.2 g/dL — ABNORMAL LOW (ref 6.5–8.1)

## 2021-11-28 LAB — CBC
HCT: 41.4 % (ref 36.0–46.0)
Hemoglobin: 12.8 g/dL (ref 12.0–15.0)
MCH: 32.2 pg (ref 26.0–34.0)
MCHC: 30.9 g/dL (ref 30.0–36.0)
MCV: 104.3 fL — ABNORMAL HIGH (ref 80.0–100.0)
Platelets: 108 10*3/uL — ABNORMAL LOW (ref 150–400)
RBC: 3.97 MIL/uL (ref 3.87–5.11)
RDW: 12.2 % (ref 11.5–15.5)
WBC: 5.3 10*3/uL (ref 4.0–10.5)
nRBC: 0 % (ref 0.0–0.2)

## 2021-11-28 MED ORDER — AZITHROMYCIN 500 MG PO TABS: 500.0000 mg | ORAL_TABLET | Freq: Once | ORAL | 0 refills | Status: AC

## 2021-11-28 MED ORDER — ADULT MULTIVITAMIN LIQUID CH: 15.0000 mL | Freq: Every day | ORAL | Status: DC

## 2021-11-28 MED ORDER — IPRATROPIUM-ALBUTEROL 0.5-2.5 (3) MG/3ML IN SOLN: 3.0000 mL | RESPIRATORY_TRACT | 1 refills | Status: DC | PRN

## 2021-11-28 MED ORDER — IPRATROPIUM-ALBUTEROL 0.5-2.5 (3) MG/3ML IN SOLN
3.0000 mL | Freq: Three times a day (TID) | RESPIRATORY_TRACT | Status: DC
  Administered 2021-11-28: 08:00:00 3 mL via RESPIRATORY_TRACT
  Filled 2021-11-28: qty 3

## 2021-11-28 MED ORDER — ALBUTEROL SULFATE (2.5 MG/3ML) 0.083% IN NEBU
2.5000 mg | INHALATION_SOLUTION | Freq: Four times a day (QID) | RESPIRATORY_TRACT | Status: DC | PRN
  Administered 2021-11-28: 04:00:00 2.5 mg via RESPIRATORY_TRACT
  Filled 2021-11-28: qty 3

## 2021-11-28 NOTE — Progress Notes (Signed)
MD order to discharge.  D/C instructions given to patient.  She verbalizes understanding of d/c instructions and made aware pain/anxiety medication refills will need to be done by her PCP.

## 2021-11-28 NOTE — Discharge Summary (Signed)
Physician Discharge Summary  Michele Lucas OVF:[id number] DOB: [birth year] DOA: 11/26/2021  PCP: Clare Gandy, MD  Admit date: 11/26/2021 Discharge date: 11/28/2021  Admitted From: home  Disposition:  home   Recommendations for Outpatient Follow-up:  PFTs Continue to encourage smoking cessation  Home Health:  none  Discharge Condition:  stable   CODE STATUS:  Full code   Diet recommendation:  heart healthy Consultations: none  Procedures/Studies: none   Discharge Diagnoses:  Principal Problem:   COPD with acute exacerbation (HCC) Active Problems:   CAP (community acquired pneumonia)   Thrombocytopenia (HCC)   Nicotine abuse   Underweight   Grade I diastolic dysfunction   Malnutrition of moderate degree       Brief Summary: 55 y.o. female with medical history significant of anxiety, chronic back pain, emphysema, endometriosis, fibromyalgia, urolithiasis, neuropathic pain, PTSD, tobacco use who is coming to the emergency department due to progressively worse dyspnea associated with fever, fatigue, productive cough and pleuritic chest pain since one day prior to admit.  She recently returned from [facility] to [place].  While she was there she ran out of her LABA inhaler and has only been using her albuterol MDI.  Hospital Course:  Principal Problem:   COPD with acute exacerbation (HCC) and community-acquired pneumonia Acute respiratory failure - Required about 5 L of oxygen when she was first admitted-has now been weaned down to room air - She continues to have some rhonchi, a cough and mild dyspnea on exertion -Refused to take ceftriaxone and was treated with azithromycin 500 mg for total of 3 days - Also refused to take Solu-Medrol and prednisone - States she improved with nebulizer treatments which we have continued and I have also given her prescription for -Recommend outpatient PFTs  Active Problems:      Nicotine abuse -Encouraged to stop smoking     Underweight  -Malnutrition of moderate degree -She states she lost a great deal of weight after her husband died but while in [place] recently, living with her son, she has gained about 10 pounds -Continues to drink boost and Ensure as needed    Grade I diastolic dysfunction -Compensated    Thrombocytopenia (HCC) -When compared to prior labs, this appears to have started in 2020 - Follow-up as outpatient   Discharge Exam: Vitals:   11/28/21 0522 11/28/21 0809  BP: 109/75   Pulse: (!) 103   Resp: 20   Temp: 97.8 F (36.6 C)   SpO2: 97% (!) 88%   Vitals:   11/28/21 0044 11/28/21 0200 11/28/21 0522 11/28/21 0809  BP:   109/75   Pulse:   (!) 103   Resp: 18 16 20    Temp:   97.8 F (36.6 C)   TempSrc:   Oral   SpO2:   97% (!) 88%  Weight:      Height:        General: Pt is alert, awake, not in acute distress Cardiovascular: RRR, S1/S2 +, no rubs, no gallops Respiratory: Mild rhonchi bilaterally Abdominal: Soft, NT, ND, bowel sounds + Extremities: no edema, no cyanosis   Discharge Instructions  Discharge Instructions     Diet - low sodium heart healthy   Complete by: As directed    Increase activity slowly   Complete by: As directed       Allergies as of 11/28/2021       Reactions   Guaifenesin & Derivatives Shortness Of Breath   "Mucinex brand"   Ibuprofen  Upset stomach   Neurontin [gabapentin] Other (See Comments)   Reported by [facility] - slowed heart rate per pt   Prozac [fluoxetine Hcl] Other (See Comments)   Loss of appetite, sedation   Tylenol [acetaminophen]    Upset stomach   Ampicillin Hives, Rash   Has patient had a PCN reaction causing immediate rash, facial/tongue/throat swelling, SOB or lightheadedness with hypotension: Yes Has patient had a PCN reaction causing severe rash involving mucus membranes or skin necrosis: No Has patient had a PCN reaction that required hospitalization: Unknown Has patient had a PCN reaction  occurring within the last 10 years: Unknown If all of the above answers are "NO", then may proceed with Cephalosporin use.   Clindamycin/lincomycin Swelling, Rash   Have these reactions in feet, ankles and legs.   Penicillins Hives, Rash        Medication List     TAKE these medications    albuterol 108 (90 Base) MCG/ACT inhaler Commonly known as: VENTOLIN HFA Inhale 2 puffs into the lungs 5 (five) times daily as needed for wheezing or shortness of breath.   azithromycin 500 MG tablet Commonly known as: Zithromax Take 1 tablet (500 mg total) by mouth once for 1 dose. Take at 8 pm tonight   benzonatate 200 MG capsule Commonly known as: TESSALON Take 1 capsule (200 mg total) by mouth 3 (three) times daily.   budesonide-formoterol 160-4.5 MCG/ACT inhaler Commonly known as: SYMBICORT Inhale 2 puffs into the lungs 2 (two) times daily.   Buprenorphine HCl-Naloxone HCl 8-2 MG Film Place 1 Film under the tongue daily. Last Fill 10-14-2021   feeding supplement (GLUCERNA SHAKE) Liqd Take 237 mLs by mouth 3 (three) times daily between meals. What changed: additional instructions   fluticasone-salmeterol 500-50 MCG/ACT Aepb Commonly known as: ADVAIR Inhale 1 puff into the lungs in the morning and at bedtime.   ipratropium-albuterol 0.5-2.5 (3) MG/3ML Soln Commonly known as: DUONEB Take 3 mLs by nebulization every 4 (four) hours as needed.   multivitamin Liqd Take 15 mLs by mouth daily. Start taking on: November 29, 2021   OLANZapine 2.5 MG tablet Commonly known as: ZYPREXA Take 2.5 mg by mouth at bedtime.   sertraline 100 MG tablet Commonly known as: ZOLOFT Take 100 mg by mouth daily.        Allergies  Allergen Reactions   Guaifenesin & Derivatives Shortness Of Breath    "Mucinex brand"   Ibuprofen     Upset stomach   Neurontin [Gabapentin] Other (See Comments)    Reported by [facility] - slowed heart rate per pt   Prozac [Fluoxetine Hcl] Other  (See Comments)    Loss of appetite, sedation   Tylenol [Acetaminophen]     Upset stomach   Ampicillin Hives and Rash    Has patient had a PCN reaction causing immediate rash, facial/tongue/throat swelling, SOB or lightheadedness with hypotension: Yes Has patient had a PCN reaction causing severe rash involving mucus membranes or skin necrosis: No Has patient had a PCN reaction that required hospitalization: Unknown Has patient had a PCN reaction occurring within the last 10 years: Unknown If all of the above answers are "NO", then may proceed with Cephalosporin use.   Clindamycin/Lincomycin Swelling and Rash    Have these reactions in feet, ankles and legs.   Penicillins Hives and Rash      DG Chest 2 View  Result Date: 11/26/2021 CLINICAL DATA:  Shortness of breath. EXAM: CHEST - 2  VIEW COMPARISON:  06/30/2020 FINDINGS: Lungs are hyperexpanded consistent with emphysema. Patchy/nodular airspace opacity is seen in the lower left lung. Right lung clear. The cardiopericardial silhouette is within normal limits for size. The visualized bony structures of the thorax show no acute abnormality. IMPRESSION: Patchy/nodular airspace opacity in the lower left lung, suspicious for pneumonia. Given the somewhat nodular appearance, close follow-up recommended to ensure resolution. Electronically Signed   By: Misty Stanley M.D.   On: 11/26/2021 08:42     The results of significant diagnostics from this hospitalization (including imaging, microbiology, ancillary and laboratory) are listed below for reference.     Microbiology: Recent Results (from the past 240 hour(s))  Resp Panel by RT-PCR (Flu A&B, Covid) Nasopharyngeal Swab     Status: None   Collection Time: 11/26/21  8:51 AM   Specimen: Nasopharyngeal Swab; Nasopharyngeal(NP) swabs in vial transport medium  Result Value Ref Range Status   SARS Coronavirus 2 by RT PCR NEGATIVE NEGATIVE Final    Comment: (NOTE) SARS-CoV-2 target nucleic acids are  NOT DETECTED.  The SARS-CoV-2 RNA is generally detectable in upper respiratory specimens during the acute phase of infection. The lowest concentration of SARS-CoV-2 viral copies this assay can detect is 138 copies/mL. A negative result does not preclude SARS-Cov-2 infection and should not be used as the sole basis for treatment or other patient management decisions. A negative result may occur with  improper specimen collection/handling, submission of specimen other than nasopharyngeal swab, presence of viral mutation(s) within the areas targeted by this assay, and inadequate number of viral copies(<138 copies/mL). A negative result must be combined with clinical observations, patient history, and epidemiological information. The expected result is Negative.  Fact Sheet for Patients:  EntrepreneurPulse.com.au  Fact Sheet for Healthcare Providers:  IncredibleEmployment.be  This test is no t yet approved or cleared by the [place] FDA and  has been authorized for detection and/or diagnosis of SARS-CoV-2 by FDA under an Emergency Use Authorization (EUA). This EUA will remain  in effect (meaning this test can be used) for the duration of the COVID-19 declaration under Section 564(b)(1) of the Act, 21 U.S.C.section 360bbb-3(b)(1), unless the authorization is terminated  or revoked sooner.       Influenza A by PCR NEGATIVE NEGATIVE Final   Influenza B by PCR NEGATIVE NEGATIVE Final    Comment: (NOTE) The Xpert Xpress SARS-CoV-2/FLU/RSV plus assay is intended as an aid in the diagnosis of influenza from Nasopharyngeal swab specimens and should not be used as a sole basis for treatment. Nasal washings and aspirates are unacceptable for Xpert Xpress SARS-CoV-2/FLU/RSV testing.  Fact Sheet for Patients: EntrepreneurPulse.com.au  Fact Sheet for Healthcare Providers: IncredibleEmployment.be  This test is not  yet approved or cleared by the [place] FDA and has been authorized for detection and/or diagnosis of SARS-CoV-2 by FDA under an Emergency Use Authorization (EUA). This EUA will remain in effect (meaning this test can be used) for the duration of the COVID-19 declaration under Section 564(b)(1) of the Act, 21 U.S.C. section 360bbb-3(b)(1), unless the authorization is terminated or revoked.  Performed at [facility], [place] [address] [place], [place] [address]      Labs: BNP (last 3 results) No results for input(s): BNP in the last 8760 hours. Basic Metabolic Panel: Recent Labs  Lab 11/26/21 0851 11/28/21 0522  NA 141 140  K 4.0 4.2  CL 104 103  CO2 28 31  GLUCOSE 108* 87  BUN 11 20  CREATININE 0.57  0.67  CALCIUM 9.5 9.3   Liver Function Tests: Recent Labs  Lab 11/26/21 1130 11/28/21 0522  AST 24 17  ALT 22 16  ALKPHOS 45 37*  BILITOT 0.8 1.0  PROT 7.6 6.2*  ALBUMIN 4.1 3.5   No results for input(s): LIPASE, AMYLASE in the last 168 hours. No results for input(s): AMMONIA in the last 168 hours. CBC: Recent Labs  Lab 11/26/21 0851 11/27/21 0506 11/28/21 0522  WBC 8.8 9.8 5.3  NEUTROABS 7.0  --   --   HGB 14.2 12.2 12.8  HCT 45.5 39.5 41.4  MCV 102.2* 104.2* 104.3*  PLT 118* 103* 108*   Cardiac Enzymes: No results for input(s): CKTOTAL, CKMB, CKMBINDEX, TROPONINI in the last 168 hours. BNP: Invalid input(s): POCBNP CBG: No results for input(s): GLUCAP in the last 168 hours. D-Dimer No results for input(s): DDIMER in the last 72 hours. Hgb A1c No results for input(s): HGBA1C in the last 72 hours. Lipid Profile No results for input(s): CHOL, HDL, LDLCALC, TRIG, CHOLHDL, LDLDIRECT in the last 72 hours. Thyroid function studies No results for input(s): TSH, T4TOTAL, T3FREE, THYROIDAB in the last 72 hours.  Invalid input(s): FREET3 Anemia work up No results for input(s): VITAMINB12, FOLATE, FERRITIN, TIBC, IRON, RETICCTPCT in  the last 72 hours. Urinalysis    Component Value Date/Time   COLORURINE YELLOW 06/30/2020 1522   APPEARANCEUR CLEAR 06/30/2020 1522   LABSPEC 1.019 06/30/2020 1522   PHURINE 5.0 06/30/2020 1522   GLUCOSEU NEGATIVE 06/30/2020 1522   HGBUR NEGATIVE 06/30/2020 1522   BILIRUBINUR NEGATIVE 06/30/2020 1522   KETONESUR NEGATIVE 06/30/2020 1522   PROTEINUR NEGATIVE 06/30/2020 1522   UROBILINOGEN 1.0 10/01/2015 1225   NITRITE NEGATIVE 06/30/2020 1522   LEUKOCYTESUR NEGATIVE 06/30/2020 1522   Sepsis Labs Invalid input(s): PROCALCITONIN,  WBC,  LACTICIDVEN Microbiology Recent Results (from the past 240 hour(s))  Resp Panel by RT-PCR (Flu A&B, Covid) Nasopharyngeal Swab     Status: None   Collection Time: 11/26/21  8:51 AM   Specimen: Nasopharyngeal Swab; Nasopharyngeal(NP) swabs in vial transport medium  Result Value Ref Range Status   SARS Coronavirus 2 by RT PCR NEGATIVE NEGATIVE Final    Comment: (NOTE) SARS-CoV-2 target nucleic acids are NOT DETECTED.  The SARS-CoV-2 RNA is generally detectable in upper respiratory specimens during the acute phase of infection. The lowest concentration of SARS-CoV-2 viral copies this assay can detect is 138 copies/mL. A negative result does not preclude SARS-Cov-2 infection and should not be used as the sole basis for treatment or other patient management decisions. A negative result may occur with  improper specimen collection/handling, submission of specimen other than nasopharyngeal swab, presence of viral mutation(s) within the areas targeted by this assay, and inadequate number of viral copies(<138 copies/mL). A negative result must be combined with clinical observations, patient history, and epidemiological information. The expected result is Negative.  Fact Sheet for Patients:  EntrepreneurPulse.com.au  Fact Sheet for Healthcare Providers:  IncredibleEmployment.be  This test is no t yet approved or  cleared by the [place] FDA and  has been authorized for detection and/or diagnosis of SARS-CoV-2 by FDA under an Emergency Use Authorization (EUA). This EUA will remain  in effect (meaning this test can be used) for the duration of the COVID-19 declaration under Section 564(b)(1) of the Act, 21 U.S.C.section 360bbb-3(b)(1), unless the authorization is terminated  or revoked sooner.       Influenza A by PCR NEGATIVE NEGATIVE Final   Influenza B by PCR NEGATIVE  NEGATIVE Final    Comment: (NOTE) The Xpert Xpress SARS-CoV-2/FLU/RSV plus assay is intended as an aid in the diagnosis of influenza from Nasopharyngeal swab specimens and should not be used as a sole basis for treatment. Nasal washings and aspirates are unacceptable for Xpert Xpress SARS-CoV-2/FLU/RSV testing.  Fact Sheet for Patients: EntrepreneurPulse.com.au  Fact Sheet for Healthcare Providers: IncredibleEmployment.be  This test is not yet approved or cleared by the [place] FDA and has been authorized for detection and/or diagnosis of SARS-CoV-2 by FDA under an Emergency Use Authorization (EUA). This EUA will remain in effect (meaning this test can be used) for the duration of the COVID-19 declaration under Section 564(b)(1) of the Act, 21 U.S.C. section 360bbb-3(b)(1), unless the authorization is terminated or revoked.  Performed at [facility], [place] [address] [place], [place] [address]      Time coordinating discharge in minutes: 53  SIGNED:   Debbe Odea, MD  [facility] 11/28/2021, 12:24 PM

## 2021-12-25 ENCOUNTER — Encounter (HOSPITAL_COMMUNITY): Payer: Self-pay | Admitting: Radiology

## 2022-01-07 ENCOUNTER — Emergency Department (HOSPITAL_COMMUNITY)
Admission: EM | Admit: 2022-01-07 | Discharge: 2022-01-07 | Disposition: A | Discharge status: Home / Self Care | Payer: Medicare PPO | Attending: Emergency Medicine | Admitting: Emergency Medicine

## 2022-01-07 ENCOUNTER — Encounter (HOSPITAL_COMMUNITY): Payer: Self-pay

## 2022-01-07 ENCOUNTER — Emergency Department (HOSPITAL_COMMUNITY): Payer: Medicare PPO

## 2022-01-07 ENCOUNTER — Other Ambulatory Visit: Payer: Self-pay

## 2022-01-07 DIAGNOSIS — F17210 Nicotine dependence, cigarettes, uncomplicated: Secondary | ICD-10-CM | POA: Diagnosis not present

## 2022-01-07 DIAGNOSIS — I5030 Unspecified diastolic (congestive) heart failure: Secondary | ICD-10-CM | POA: Insufficient documentation

## 2022-01-07 DIAGNOSIS — R059 Cough, unspecified: Secondary | ICD-10-CM | POA: Diagnosis not present

## 2022-01-07 DIAGNOSIS — R5383 Other fatigue: Secondary | ICD-10-CM | POA: Diagnosis not present

## 2022-01-07 DIAGNOSIS — Z20822 Contact with and (suspected) exposure to covid-19: Secondary | ICD-10-CM | POA: Diagnosis not present

## 2022-01-07 DIAGNOSIS — R0789 Other chest pain: Secondary | ICD-10-CM | POA: Diagnosis not present

## 2022-01-07 DIAGNOSIS — R000 Tachycardia, unspecified: Secondary | ICD-10-CM | POA: Diagnosis not present

## 2022-01-07 DIAGNOSIS — J441 Chronic obstructive pulmonary disease with (acute) exacerbation: Secondary | ICD-10-CM | POA: Insufficient documentation

## 2022-01-07 DIAGNOSIS — R0602 Shortness of breath: Secondary | ICD-10-CM | POA: Diagnosis present

## 2022-01-07 LAB — CBC WITH DIFFERENTIAL/PLATELET
Abs Immature Granulocytes: 0.02 10*3/uL (ref 0.00–0.07)
Basophils Absolute: 0 10*3/uL (ref 0.0–0.1)
Basophils Relative: 0 %
Eosinophils Absolute: 0 10*3/uL (ref 0.0–0.5)
Eosinophils Relative: 0 %
HCT: 43.6 % (ref 36.0–46.0)
Hemoglobin: 14.1 g/dL (ref 12.0–15.0)
Immature Granulocytes: 0 %
Lymphocytes Relative: 24 %
Lymphs Abs: 1.2 10*3/uL (ref 0.7–4.0)
MCH: 31.9 pg (ref 26.0–34.0)
MCHC: 32.3 g/dL (ref 30.0–36.0)
MCV: 98.6 fL (ref 80.0–100.0)
Monocytes Absolute: 0.4 10*3/uL (ref 0.1–1.0)
Monocytes Relative: 8 %
Neutro Abs: 3.3 10*3/uL (ref 1.7–7.7)
Neutrophils Relative %: 68 %
Platelets: 157 10*3/uL (ref 150–400)
RBC: 4.42 MIL/uL (ref 3.87–5.11)
RDW: 12.5 % (ref 11.5–15.5)
WBC: 4.9 10*3/uL (ref 4.0–10.5)
nRBC: 0 % (ref 0.0–0.2)

## 2022-01-07 LAB — RESP PANEL BY RT-PCR (FLU A&B, COVID) ARPGX2
Influenza A by PCR: NEGATIVE
Influenza B by PCR: NEGATIVE
SARS Coronavirus 2 by RT PCR: NEGATIVE

## 2022-01-07 LAB — BASIC METABOLIC PANEL
Anion gap: 7 (ref 5–15)
BUN: 13 mg/dL (ref 6–20)
CO2: 27 mmol/L (ref 22–32)
Calcium: 9.8 mg/dL (ref 8.9–10.3)
Chloride: 108 mmol/L (ref 98–111)
Creatinine, Ser: 0.68 mg/dL (ref 0.44–1.00)
GFR, Estimated: >60 mL/min (ref >60)
Glucose, Bld: 91 mg/dL (ref 70–99)
Potassium: 4 mmol/L (ref 3.5–5.1)
Sodium: 142 mmol/L (ref 135–145)

## 2022-01-07 LAB — D-DIMER, QUANTITATIVE: D-Dimer, Quant: <0.27 ug/mL-FEU (ref 0.00–0.50)

## 2022-01-07 LAB — TROPONIN I (HIGH SENSITIVITY)
Troponin I (High Sensitivity): 4 ng/L (ref <18)
Troponin I (High Sensitivity): 4 ng/L (ref <18)

## 2022-01-07 MED ORDER — IPRATROPIUM-ALBUTEROL 0.5-2.5 (3) MG/3ML IN SOLN
3.0000 mL | Freq: Once | RESPIRATORY_TRACT | Status: AC
  Administered 2022-01-07: 3 mL via RESPIRATORY_TRACT
  Filled 2022-01-07: qty 3

## 2022-01-07 MED ORDER — METHYLPREDNISOLONE SODIUM SUCC 125 MG IJ SOLR
125.0000 mg | Freq: Once | INTRAMUSCULAR | Status: AC
  Administered 2022-01-07: 125 mg via INTRAVENOUS
  Filled 2022-01-07: qty 2

## 2022-01-07 MED ORDER — AZITHROMYCIN 250 MG PO TABS: 250.0000 mg | ORAL_TABLET | Freq: Every day | ORAL | 0 refills | Status: DC

## 2022-01-07 MED ORDER — PREDNISONE 50 MG PO TABS: 50.0000 mg | ORAL_TABLET | Freq: Every day | ORAL | 0 refills | Status: AC

## 2022-01-07 MED ORDER — HYDROXYZINE HCL 25 MG PO TABS
25.0000 mg | ORAL_TABLET | Freq: Once | ORAL | Status: AC
  Administered 2022-01-07: 25 mg via ORAL
  Filled 2022-01-07: qty 1

## 2022-01-07 MED ORDER — ALBUTEROL SULFATE (2.5 MG/3ML) 0.083% IN NEBU: 2.5000 mg | INHALATION_SOLUTION | Freq: Four times a day (QID) | RESPIRATORY_TRACT | 12 refills | Status: AC | PRN

## 2022-01-07 MED ORDER — SODIUM CHLORIDE 0.9 % IV BOLUS
500.0000 mL | Freq: Once | INTRAVENOUS | Status: AC
  Administered 2022-01-07: 500 mL via INTRAVENOUS

## 2022-01-07 NOTE — ED Provider Notes (Signed)
[facility]-EMERGENCY DEPT Provider Note   CSN: [id number] Arrival date & time: 01/07/22  0730     History  Chief Complaint  Patient presents with   Shortness of Breath   Cough    Michele Lucas is a 56 y.o. female.  Michele Lucas is a 56 y.o. female with hx of COPD, fibromyalgia, Chronic pain syndrome, who presents with shortness of breath and cough. Pt had a recent hospitalization in December 2022 for left lobe pneumonia. For two weeks she has noticed increased white/yellow sputum production from baseline, worsening shortness of breath and fatigue, an dull chest pain that improves with coughing. She is concerned that she has pneumonia again. Following her previous admission, she was discharged with one dose of azithromycin and instructions for a breathing treatment. Pt has been using her grandson's nebulizer machine. She was advised to follow up with her PCP at the [place] where she had a CT of her chest 2 weeks ago. She is unaware of any results. At home she uses an Symbicort daily and a rescue inhaler x5 daily. She also has been using 5L of oxygen nightly with a machine that was previously prescribed to her husband. She endorses smoking cigarettes since age 61. She has experienced night sweats for several years, weight loss following the loss of her husband two years ago, and leg pain that she associates with her Raynaud's. She denies fever and chills, n/v, blood-tinged sputum, congestion, and pain in her lower legs.  The history is provided by the patient.      Home Medications Prior to Admission medications   Medication Sig Start Date End Date Taking? Authorizing Provider  albuterol (PROVENTIL HFA;VENTOLIN HFA) 108 (90 Base) MCG/ACT inhaler Inhale 2 puffs into the lungs 5 (five) times daily as needed for wheezing or shortness of breath.    Yes [provider]  albuterol (PROVENTIL) (2.5 MG/3ML) 0.083% nebulizer solution Take 3 mLs (2.5 mg total) by  nebulization every 6 (six) hours as needed for wheezing or shortness of breath. 01/07/22  Yes Dartha Lodge, PA-C  azithromycin (ZITHROMAX) 250 MG tablet Take 1 tablet (250 mg total) by mouth daily. Take first 2 tablets together, then 1 every day until finished. 01/07/22  Yes Dartha Lodge, PA-C  feeding supplement, GLUCERNA SHAKE, (GLUCERNA SHAKE) LIQD Take 237 mLs by mouth 3 (three) times daily between meals. Patient taking differently: Take 237 mLs by mouth 3 (three) times daily between meals. Chocolate (only) 04/13/18  Yes Roberto Scales D, MD  fluticasone-salmeterol (ADVAIR) 500-50 MCG/ACT AEPB Inhale 1 puff into the lungs in the morning and at bedtime.   Yes [provider]  hydrOXYzine (ATARAX) 25 MG tablet Take 25 mg by mouth 3 (three) times daily as needed for anxiety.   Yes [provider]  predniSONE (DELTASONE) 50 MG tablet Take 1 tablet (50 mg total) by mouth daily for 4 days. 01/07/22 01/11/22 Yes Dartha Lodge, PA-C  Buprenorphine HCl-Naloxone HCl 8-2 MG FILM Place 1 Film under the tongue daily. Last Fill 10-14-2021 07/09/21   [provider]  ipratropium-albuterol (DUONEB) 0.5-2.5 (3) MG/3ML SOLN Take 3 mLs by nebulization every 4 (four) hours as needed. Patient taking differently: Take 3 mLs by nebulization every 4 (four) hours as needed. Has medication ( no machine) 11/28/21   Calvert Cantor, MD      Allergies    Guaifenesin & derivatives, Cymbalta [duloxetine hcl], Ibuprofen, Neurontin [gabapentin], Prozac [fluoxetine hcl], Tylenol [acetaminophen], Ampicillin, Clindamycin/lincomycin, and Penicillins  Review of Systems   Review of Systems  Constitutional:  Negative for chills and fever.  Respiratory:  Positive for cough, shortness of breath and wheezing.   Cardiovascular:  Positive for chest pain.  Gastrointestinal:  Negative for abdominal pain, nausea and vomiting.  Genitourinary:  Negative for dysuria and frequency.  Neurological:  Positive for weakness  (Generalized). Negative for dizziness, syncope and light-headedness.  All other systems reviewed and are negative.  Physical Exam Updated Vital Signs BP 135/82    Pulse (!) 112    Temp 97.8 F (36.6 C) (Oral)    Resp 18    SpO2 98%  Physical Exam Vitals and nursing note reviewed.  Constitutional:      General: She is not in acute distress.    Appearance: Normal appearance. She is well-developed. She is not diaphoretic.     Comments: Alert, chronically ill appearing but in no acute distress  HENT:     Head: Normocephalic and atraumatic.  Eyes:     General:        Right eye: No discharge.        Left eye: No discharge.     Pupils: Pupils are equal, round, and reactive to light.  Cardiovascular:     Rate and Rhythm: Regular rhythm. Tachycardia present.     Pulses: Normal pulses.     Heart sounds: Normal heart sounds.     Comments: Tachycardia with regular rhythm Pulmonary:     Effort: Pulmonary effort is normal. Tachypnea present. No respiratory distress.     Breath sounds: Wheezing present. No rales.     Comments: On arrival patient is mildly tachypneic but with normal effort, able to speak in full sentences, on auscultation breath sounds are decreased bilaterally with some expiratory wheezing in bilateral bases. Abdominal:     General: Bowel sounds are normal. There is no distension.     Palpations: Abdomen is soft. There is no mass.     Tenderness: There is no abdominal tenderness. There is no guarding.     Comments: Abdomen soft, nondistended, nontender to palpation in all quadrants without guarding or peritoneal signs  Musculoskeletal:        General: No deformity.     Cervical back: Neck supple.     Right lower leg: No tenderness. No edema.     Left lower leg: No tenderness. No edema.  Skin:    General: Skin is warm and dry.     Capillary Refill: Capillary refill takes less than 2 seconds.  Neurological:     Mental Status: She is alert and oriented to person, place, and  time.     Coordination: Coordination normal.     Comments: Speech is clear, able to follow commands Moves extremities without ataxia, coordination intact  Psychiatric:        Mood and Affect: Mood normal.        Behavior: Behavior normal.    ED Results / Procedures / Treatments   Labs (all labs ordered are listed, but only abnormal results are displayed) Labs Reviewed  RESP PANEL BY RT-PCR (FLU A&B, COVID) [id number]  BASIC METABOLIC PANEL  CBC WITH DIFFERENTIAL/PLATELET  D-DIMER, QUANTITATIVE  TROPONIN I (HIGH SENSITIVITY)  TROPONIN I (HIGH SENSITIVITY)    EKG EKG Interpretation  Date/Time:  Monday January 07 2022 10:12:06 EST Ventricular Rate:  108 PR Interval:  114 QRS Duration: 69 QT Interval:  355 QTC Calculation: 476 R Axis:   74 Text Interpretation: Sinus tachycardia Baseline wander lead  V3 Confirmed by Octaviano Glow [phone]) on 01/07/2022 10:33:30 AM  Radiology DG Chest 2 View  Result Date: 01/07/2022 CLINICAL DATA:  Dyspnea and productive cough, COPD EXAM: CHEST - 2 VIEW COMPARISON:  11/26/2021 chest radiograph. FINDINGS: Stable cardiomediastinal silhouette with normal heart size. No pneumothorax. No pleural effusion. Hyperinflated lungs. No pulmonary edema. No acute consolidative airspace disease. Previously described patchy nodular opacities in the lower left lung appear resolved. IMPRESSION: 1. No acute cardiopulmonary disease. 2. Hyperinflated lungs, compatible with reported COPD. Electronically Signed   By: Ilona Sorrel M.D.   On: 01/07/2022 10:08    Procedures Procedures    Medications Ordered in ED Medications  ipratropium-albuterol (DUONEB) 0.5-2.5 (3) MG/3ML nebulizer solution 3 mL (3 mLs Nebulization Given 01/07/22 0914)  methylPREDNISolone sodium succinate (SOLU-MEDROL) 125 mg/2 mL injection 125 mg (125 mg Intravenous Given 01/07/22 0914)  ipratropium-albuterol (DUONEB) 0.5-2.5 (3) MG/3ML nebulizer solution 3 mL (3 mLs Nebulization Given 01/07/22 1059)   hydrOXYzine (ATARAX) tablet 25 mg (25 mg Oral Given 01/07/22 1059)  sodium chloride 0.9 % bolus 500 mL (0 mLs Intravenous Stopped 01/07/22 1303)    ED Course/ Medical Decision Making/ A&P                           This patient presents to the ED for concern of shortness of breath, cough, chest pain, this involves an extensive number of treatment options, and is a complaint that carries with it a high risk of complications and morbidity.  The differential diagnosis includes ACS, PE, dissection, pneumonia, CHF, COPD exacerbation, COVID, flu, viral upper respiratory infection   Co morbidities that complicate the patient evaluation  COPD, chronic pain syndrome, fibromyalgia, tobacco use CHF with grade 1 diastolic dysfunction   Additional history obtained:  Additional history obtained from medical records External records from outside source obtained and reviewed including recent hospitalization in December for pneumonia, chronic medications prescribed by [facility]   Lab Tests:  I Ordered, and personally interpreted labs.  The pertinent results include: No leukocytosis and normal hemoglobin, no significant electrolyte derangements, troponins negative x2, negative D-dimer, PE unlikely no need for CTA chest Negative COVID and flu   Imaging Studies ordered:  I ordered imaging studies including CXR  I independently visualized and interpreted imaging which showed no evidence of pneumonia or edema, chronic signs of COPD with hyperinflation I agree with the radiologist interpretation   Cardiac Monitoring:  The patient was maintained on a cardiac monitor.  I personally viewed and interpreted the cardiac monitored which showed an underlying rhythm of: Sinus tachycardia   Medicines ordered and prescription drug management:  I ordered medication including DuoNeb and Solu-Medrol for shortness of breath and wheezing Reevaluation of the patient after these medicines showed that the patient  improved  but still has some wheezing, now with increased air movement, given additional DuoNeb I have reviewed the patients home medicines and have made adjustments as needed Patient requesting her home anxiety and pain medication, home meds have been reviewed by pharmacy and she has hydroxyzine listed but no chronic pain medication and no recent narcotic prescriptions when PDMP reviewed.  Patient initially reported that she was taking oxycodone daily but now reports she is waiting on a different prescription from her PCP.  Will order hydroxyzine but hold on any additional medications for her chronic pain as she reports she is not having any acute or new pain out of her usual.     ED Course:  Work-up is overall reassuring and most consistent with COPD exacerbation, work-up does not suggest signs of ACS, negative D-dimer so doubt PE.  No pneumonia noted on chest x-ray, and viral testing is negative. After additional breathing treatments patient's symptoms have improved.  She remained tachycardic after albuterol but after some time and IV fluids and this resolved as well.   Reevaluation:  After the interventions noted above, I reevaluated the patient and found that they have :improved   Social Determinants of Health:  Continued tobacco use in the setting of COPD   Dispostion:  After consideration of the diagnostic results and the patients response to treatment, I considered admission but patient saw additional improvement after second DuoNeb with significantly increased air movement and only faint wheezing, ambulated in the department and maintained normal O2 sats so feel patient is appropriate for discharge home with continued outpatient treatment for COPD exacerbation with continued nebulizer treatments, steroid burst and given patient's increased sputum production will treat with course of azithromycin.  Stressed the importance of close follow-up and return precautions.  Discharged home in good  condition.         Final Clinical Impression(s) / ED Diagnoses Final diagnoses:  COPD exacerbation ([place])    Rx / DC Orders ED Discharge Orders          Ordered    predniSONE (DELTASONE) 50 MG tablet  Daily        01/07/22 1253    albuterol (PROVENTIL) (2.5 MG/3ML) 0.083% nebulizer solution  Every 6 hours PRN        01/07/22 1253    azithromycin (ZITHROMAX) 250 MG tablet  Daily        01/07/22 1253              Benedetto Goad [place], [place] 01/08/22 [id number]    Wyvonnia Dusky, MD 01/08/22 [phone]

## 2022-01-07 NOTE — ED Triage Notes (Signed)
Pt c/o SOB and productive cough since December. Pt states Hx of COPD.

## 2022-01-07 NOTE — ED Notes (Addendum)
Pt ambulated pulse ox was 97 o2 and 133 p while walking

## 2022-01-07 NOTE — Discharge Instructions (Addendum)
Take steroids for the next 4 days starting tomorrow morning, continue with your daily maintenance inhaler and use albuterol inhaler or nebulizer machine every 4 hours as needed for breakthrough symptoms.  Take azithromycin as prescribed for the next 5 days.  Follow-up with your regular doctor for close follow-up.  Return for worsening shortness of breath.

## 2022-02-13 ENCOUNTER — Encounter (HOSPITAL_COMMUNITY): Payer: Self-pay

## 2022-02-13 ENCOUNTER — Emergency Department (HOSPITAL_COMMUNITY)
Admission: EM | Admit: 2022-02-13 | Discharge: 2022-02-14 | Disposition: A | Discharge status: Home / Self Care | Payer: Medicare PPO | Attending: Emergency Medicine | Admitting: Emergency Medicine

## 2022-02-13 ENCOUNTER — Emergency Department (HOSPITAL_COMMUNITY): Payer: Medicare PPO

## 2022-02-13 ENCOUNTER — Other Ambulatory Visit: Payer: Self-pay

## 2022-02-13 DIAGNOSIS — Z7951 Long term (current) use of inhaled steroids: Secondary | ICD-10-CM | POA: Insufficient documentation

## 2022-02-13 DIAGNOSIS — N9489 Other specified conditions associated with female genital organs and menstrual cycle: Secondary | ICD-10-CM | POA: Insufficient documentation

## 2022-02-13 DIAGNOSIS — J449 Chronic obstructive pulmonary disease, unspecified: Secondary | ICD-10-CM | POA: Diagnosis not present

## 2022-02-13 DIAGNOSIS — R1031 Right lower quadrant pain: Secondary | ICD-10-CM | POA: Insufficient documentation

## 2022-02-13 DIAGNOSIS — R350 Frequency of micturition: Secondary | ICD-10-CM | POA: Insufficient documentation

## 2022-02-13 DIAGNOSIS — R109 Unspecified abdominal pain: Secondary | ICD-10-CM

## 2022-02-13 LAB — URINALYSIS, ROUTINE W REFLEX MICROSCOPIC
Bilirubin Urine: NEGATIVE
Glucose, UA: NEGATIVE mg/dL
Hgb urine dipstick: NEGATIVE
Ketones, ur: 5 mg/dL — AB
Leukocytes,Ua: NEGATIVE
Nitrite: NEGATIVE
Protein, ur: NEGATIVE mg/dL
Specific Gravity, Urine: 1.023 (ref 1.005–1.030)
pH: 5 (ref 5.0–8.0)

## 2022-02-13 LAB — CBC WITH DIFFERENTIAL/PLATELET
Abs Immature Granulocytes: 0.01 10*3/uL (ref 0.00–0.07)
Basophils Absolute: 0 10*3/uL (ref 0.0–0.1)
Basophils Relative: 0 %
Eosinophils Absolute: 0.1 10*3/uL (ref 0.0–0.5)
Eosinophils Relative: 2 %
HCT: 39.1 % (ref 36.0–46.0)
Hemoglobin: 12.6 g/dL (ref 12.0–15.0)
Immature Granulocytes: 0 %
Lymphocytes Relative: 42 %
Lymphs Abs: 3.2 10*3/uL (ref 0.7–4.0)
MCH: 32.6 pg (ref 26.0–34.0)
MCHC: 32.2 g/dL (ref 30.0–36.0)
MCV: 101.3 fL — ABNORMAL HIGH (ref 80.0–100.0)
Monocytes Absolute: 0.8 10*3/uL (ref 0.1–1.0)
Monocytes Relative: 10 %
Neutro Abs: 3.6 10*3/uL (ref 1.7–7.7)
Neutrophils Relative %: 46 %
Platelets: 138 10*3/uL — ABNORMAL LOW (ref 150–400)
RBC: 3.86 MIL/uL — ABNORMAL LOW (ref 3.87–5.11)
RDW: 12.6 % (ref 11.5–15.5)
WBC: 7.7 10*3/uL (ref 4.0–10.5)
nRBC: 0 % (ref 0.0–0.2)

## 2022-02-13 LAB — COMPREHENSIVE METABOLIC PANEL
ALT: 15 U/L (ref 0–44)
AST: 19 U/L (ref 15–41)
Albumin: 4.1 g/dL (ref 3.5–5.0)
Alkaline Phosphatase: 50 U/L (ref 38–126)
Anion gap: 4 — ABNORMAL LOW (ref 5–15)
BUN: 16 mg/dL (ref 6–20)
CO2: 30 mmol/L (ref 22–32)
Calcium: 9.3 mg/dL (ref 8.9–10.3)
Chloride: 103 mmol/L (ref 98–111)
Creatinine, Ser: 0.62 mg/dL (ref 0.44–1.00)
GFR, Estimated: >60 mL/min (ref >60)
Glucose, Bld: 90 mg/dL (ref 70–99)
Potassium: 3.9 mmol/L (ref 3.5–5.1)
Sodium: 137 mmol/L (ref 135–145)
Total Bilirubin: 0.5 mg/dL (ref 0.3–1.2)
Total Protein: 6.9 g/dL (ref 6.5–8.1)

## 2022-02-13 LAB — LIPASE, BLOOD: Lipase: 29 U/L (ref 11–51)

## 2022-02-13 LAB — I-STAT BETA HCG BLOOD, ED (MC, WL, AP ONLY): I-stat hCG, quantitative: <5 m[IU]/mL (ref <5)

## 2022-02-13 MED ORDER — KETOROLAC TROMETHAMINE 15 MG/ML IJ SOLN
15.0000 mg | Freq: Once | INTRAMUSCULAR | Status: AC
  Administered 2022-02-13: 15 mg via INTRAVENOUS
  Filled 2022-02-13: qty 1

## 2022-02-13 MED ORDER — IOHEXOL 300 MG/ML  SOLN
100.0000 mL | Freq: Once | INTRAMUSCULAR | Status: AC | PRN
  Administered 2022-02-13: 100 mL via INTRAVENOUS

## 2022-02-13 MED ORDER — SODIUM CHLORIDE (PF) 0.9 % IJ SOLN
INTRAMUSCULAR | Status: AC
  Filled 2022-02-13: qty 50

## 2022-02-13 MED ORDER — METHOCARBAMOL 500 MG PO TABS: 500.0000 mg | ORAL_TABLET | Freq: Two times a day (BID) | ORAL | 0 refills | Status: DC

## 2022-02-13 NOTE — ED Provider Triage Note (Signed)
Emergency Medicine Provider Triage Evaluation Note ? ?Michele Lucas , a 56 y.o. female  was evaluated in triage.  Pt complains of right-sided abdominal pain.  Denies any flank pain.  Intermittently over the last few months however worse over the last week or so.  She is also having dysuria for the last week however has had noted polyuria over the last few months.  States she has COPD and has chronic cough at baseline.  No fever.  Does get posttussive emesis "when I have COPD flares." ? ?Review of Systems  ?Positive: Right sided Abdominal pain, urinary symptoms ?Negative: Fever, emesis ? ?Physical Exam  ?BP (!) 144/77 (BP Location: Right Arm)   Pulse 91   Temp 98.6 ?F (37 ?C) (Oral)   Resp 18   Ht 5\' 1"  (1.549 m)   Wt 43.1 kg   SpO2 97%   BMI 17.95 kg/m?  ?Gen:   Awake, no distress   ?Resp:  Normal effort  ?MSK:   Moves extremities without difficulty  ?ABD:  Diffusely tender to right abd ?Other:   ? ?Medical Decision Making  ?Medically screening exam initiated at 8:55 PM.  Appropriate orders placed.  Michele Lucas was informed that the remainder of the evaluation will be completed by another provider, this initial triage assessment does not replace that evaluation, and the importance of remaining in the ED until their evaluation is complete. ? ?Right abd pain, urinary symptoms ?  ?Tymon Nemetz A, PA-C ?02/13/22 2056 ? ?

## 2022-02-13 NOTE — ED Provider Notes (Signed)
[facility]-EMERGENCY DEPT Provider Note  CSN: [id number] Arrival date & time: 02/13/22 2033  Chief Complaint(s) Abdominal Pain and Urinary Frequency  HPI Michele Lucas is a 56 y.o. female     Abdominal Pain Pain location:  RLQ and R flank Pain quality: aching and cramping   Pain radiates to:  RLQ Pain severity:  Moderate Onset quality:  Gradual Duration: 2 months. Timing:  Intermittent Progression:  Waxing and waning Chronicity:  New Relieved by:  Position changes Worsened by:  Movement and position changes Associated symptoms: no constipation, no diarrhea, no nausea and no vomiting   Urinary Frequency Associated symptoms include abdominal pain.   Past Medical History Past Medical History:  Diagnosis Date   Anxiety    Chronic back pain    COPD, mild (HCC)    Emphysema lung (HCC)    Endometriosis    Fibromyalgia    Grade I diastolic dysfunction 11/26/2021   Kidney stones    Neuropathic pain    PTSD (post-traumatic stress disorder)    Patient Active Problem List   Diagnosis Date Noted   COPD (chronic obstructive pulmonary disease) with acute bronchitis (HCC) 11/28/2021   Malnutrition of moderate degree 11/27/2021   COPD with acute exacerbation (HCC) 11/26/2021   Underweight 11/26/2021   Grade I diastolic dysfunction 11/26/2021   Protein-calorie malnutrition, severe 04/04/2018   Nicotine abuse 04/04/2018   Acute respiratory failure with hypoxia (HCC) 04/03/2018   Acute bronchitis with COPD (HCC) 04/03/2018   PTSD (post-traumatic stress disorder) 04/03/2018   Neuropathic pain 04/03/2018   Fibromyalgia 04/03/2018   Chronic back pain 04/03/2018   Anxiety 04/03/2018   Thrombocytopenia (HCC) 04/03/2018   Acute hyponatremia 04/03/2018   Elevated lactic acid level 04/03/2018   Protein calorie malnutrition (HCC) 04/03/2018   Pneumothorax on left 10/06/2016   Chronic pain syndrome 04/02/2015   Generalized anxiety disorder 04/02/2015    Hypokalemia 04/02/2015   Normocytic anemia 04/02/2015   Abnormal urinalysis 04/02/2015   SOB (shortness of breath)    CAP (community acquired pneumonia) 04/01/2015   COPD exacerbation (HCC) 04/01/2015   Home Medication(s) Prior to Admission medications   Medication Sig Start Date End Date Taking? Authorizing Provider  albuterol (PROVENTIL HFA;VENTOLIN HFA) 108 (90 Base) MCG/ACT inhaler Inhale 2 puffs into the lungs 5 (five) times daily as needed for wheezing or shortness of breath.     [provider]  albuterol (PROVENTIL) (2.5 MG/3ML) 0.083% nebulizer solution Take 3 mLs (2.5 mg total) by nebulization every 6 (six) hours as needed for wheezing or shortness of breath. 01/07/22   Dartha Lodge, PA-C  azithromycin (ZITHROMAX) 250 MG tablet Take 1 tablet (250 mg total) by mouth daily. Take first 2 tablets together, then 1 every day until finished. 01/07/22   Dartha Lodge, PA-C  Buprenorphine HCl-Naloxone HCl 8-2 MG FILM Place 1 Film under the tongue daily. Last Fill 10-14-2021 07/09/21   [provider]  feeding supplement, GLUCERNA SHAKE, (GLUCERNA SHAKE) LIQD Take 237 mLs by mouth 3 (three) times daily between meals. Patient taking differently: Take 237 mLs by mouth 3 (three) times daily between meals. Chocolate (only) 04/13/18   Roberto Scales D, MD  fluticasone-salmeterol (ADVAIR) 500-50 MCG/ACT AEPB Inhale 1 puff into the lungs in the morning and at bedtime.    [provider]  hydrOXYzine (ATARAX) 25 MG tablet Take 25 mg by mouth 3 (three) times daily as needed for anxiety.    [provider]  ipratropium-albuterol (DUONEB) 0.5-2.5 (3) MG/3ML SOLN  Take 3 mLs by nebulization every 4 (four) hours as needed. Patient taking differently: Take 3 mLs by nebulization every 4 (four) hours as needed. Has medication ( no machine) 11/28/21   Calvert Cantor, MD  methocarbamol (ROBAXIN) 500 MG tablet Take 1 tablet (500 mg total) by mouth 2 (two) times daily. 02/13/22  Yes  Staceyann Knouff, Amadeo Garnet, MD                                                                                                                                    Allergies Guaifenesin & derivatives, Cymbalta [duloxetine hcl], Ibuprofen, Neurontin [gabapentin], Prozac [fluoxetine hcl], Tylenol [acetaminophen], Ampicillin, Clindamycin/lincomycin, and Penicillins  Review of Systems Review of Systems  Gastrointestinal:  Positive for abdominal pain. Negative for constipation, diarrhea, nausea and vomiting.  Genitourinary:  Positive for frequency.  As noted in HPI  Physical Exam Vital Signs  I have reviewed the triage vital signs BP 137/81 (BP Location: Right Arm)   Pulse 100   Temp 98.6 F (37 C) (Oral)   Resp 18   Ht 5\' 1"  (1.549 m)   Wt 43.1 kg   SpO2 100%   BMI 17.95 kg/m   Physical Exam Vitals reviewed.  Constitutional:      General: She is not in acute distress.    Appearance: She is well-developed. She is not diaphoretic.  HENT:     Head: Normocephalic and atraumatic.     Right Ear: External ear normal.     Left Ear: External ear normal.     Nose: Nose normal.  Eyes:     General: No scleral icterus.    Conjunctiva/sclera: Conjunctivae normal.  Neck:     Trachea: Phonation normal.  Cardiovascular:     Rate and Rhythm: Normal rate and regular rhythm.  Pulmonary:     Effort: Pulmonary effort is normal. No respiratory distress.     Breath sounds: No stridor.  Abdominal:     General: There is no distension.     Tenderness: There is no abdominal tenderness. There is right CVA tenderness. There is no left CVA tenderness. Negative signs include Murphy's sign and McBurney's sign.  Musculoskeletal:        General: Normal range of motion.     Cervical back: Normal range of motion.     Lumbar back: Tenderness present.       Back:  Neurological:     Mental Status: She is alert and oriented to person, place, and time.  Psychiatric:        Behavior: Behavior normal.    ED  Results and Treatments Labs (all labs ordered are listed, but only abnormal results are displayed) Labs Reviewed  COMPREHENSIVE METABOLIC PANEL - Abnormal; Notable for the following components:      Result Value   Anion gap 4 (*)    All other components within normal limits  CBC WITH DIFFERENTIAL/PLATELET - Abnormal; Notable for the  following components:   RBC 3.86 (*)    MCV 101.3 (*)    Platelets 138 (*)    All other components within normal limits  URINALYSIS, ROUTINE W REFLEX MICROSCOPIC - Abnormal; Notable for the following components:   Ketones, ur 5 (*)    All other components within normal limits  LIPASE, BLOOD  I-STAT BETA HCG BLOOD, ED ([facility], [facility], [facility] ONLY)                                                                                                                         EKG  EKG Interpretation  Date/Time:    Ventricular Rate:    PR Interval:    QRS Duration:   QT Interval:    QTC Calculation:   R Axis:     Text Interpretation:         Radiology DG Chest 2 View  Result Date: 02/13/2022 CLINICAL DATA:  Cough, COPD. EXAM: CHEST - 2 VIEW COMPARISON:  PA Lat 01/07/2022. FINDINGS: The cardiac size is normal. There is mild aortic tortuosity, calcifications in the aortic arch and a stable mediastinal configuration. Bilateral minimal pleural effusions have developed. The lungs are emphysematous and clear of infiltrates. Thoracic cage is intact. Slight thoracic kyphodextroscoliosis. IMPRESSION: Minimal pleural effusions. No appreciable focal pneumonic infiltrate. COPD. Electronically Signed   By: Almira Bar M.D.   On: 02/13/2022 21:21   CT Abdomen Pelvis W Contrast  Result Date: 02/13/2022 CLINICAL DATA:  Right lower quadrant pain. EXAM: CT ABDOMEN AND PELVIS WITH CONTRAST TECHNIQUE: Multidetector CT imaging of the abdomen and pelvis was performed using the standard protocol following bolus administration of intravenous contrast. RADIATION DOSE REDUCTION: This exam was  performed according to the departmental dose-optimization program which includes automated exposure control, adjustment of the mA and/or kV according to patient size and/or use of iterative reconstruction technique. CONTRAST:  OMNIPAQUE IOHEXOL 300 MG/ML  SOLN COMPARISON:  CT renal stone 06/30/2020 FINDINGS: Lower chest: No acute abnormality. Hepatobiliary: No focal liver abnormality is seen. No gallstones, gallbladder wall thickening, or biliary dilatation. Pancreas: Unremarkable. No pancreatic ductal dilatation or surrounding inflammatory changes. Spleen: Normal in size without focal abnormality. Adrenals/Urinary Tract: There are rounded hypodensities in the kidneys which are too small to characterize, most likely cysts. Otherwise, the kidneys, adrenal glands, and bladder are within normal limits. Stomach/Bowel: Evaluation for bowel pathology is limited secondary to lack of oral contrast and lack of intraperitoneal fat. Stomach is within normal limits. Appendix appears normal. No evidence of bowel wall thickening, distention, or inflammatory changes. Cecum is high-riding. Vascular/Lymphatic: Aortic atherosclerosis. No enlarged abdominal or pelvic lymph nodes. Reproductive: Uterus is grossly within normal limits. Ovaries are not well delineated on this study. Other: No abdominal wall hernia or abnormality. No abdominopelvic ascites. Musculoskeletal: There are degenerative changes of the lower lumbar spine. IMPRESSION: 1. No acute localizing process in the abdomen or pelvis. 2.  Aortic Atherosclerosis (ICD10-I70.0). Electronically Signed   By: Mcneil Sober.D.  On: 02/13/2022 22:09    Pertinent labs & imaging results that were available during my care of the patient were reviewed by me and considered in my medical decision making (see MDM for details).  Medications Ordered in ED Medications  iohexol (OMNIPAQUE) 300 MG/ML solution 100 mL (100 mLs Intravenous Contrast Given 02/13/22 2142)  ketorolac  (TORADOL) 15 MG/ML injection 15 mg (15 mg Intravenous Given 02/13/22 2343)                                                                                                                                     Procedures Procedures  (including critical care time)  Medical Decision Making / ED Course    Complexity of Problem:  Co-morbidities/SDOH that complicate the patient evaluation/care: Chronic lower back pain with reported lumbar herniated disc, fibromyalgia  Additional history obtained: none  Patient's presenting problem/concern and DDX listed below: Right flank/RLQ pain Renal colic, biliary disease, pyelonephritis/UTI, appendicitis, colitis, MSK.     Complexity of Data:   Cardiac Monitoring: No  Laboratory Tests ordered listed below with my independent interpretation: CBC without leukocytosis or anemia No significant electrolyte derangements or renal sufficiency No evidence of biliary obstruction UA without evidence of infection   Imaging Studies ordered listed below with my independent interpretation: CT without obvious urinary stone, appendicitis or colitis Radiology confirmed     ED Course:    Hospitalization Considered:  Yes of serious intra-abdominal inflammatory process/infection identified  Assessment, Intervention, and Reassessment: Right flank pain Likely MSK. Work-up reassuring. IV Toradol given Recommended neurosurgery follow-up, physical therapy, and additional supportive management    Final Clinical Impression(s) / ED Diagnoses Final diagnoses:  Right flank pain   The patient appears reasonably screened and/or stabilized for discharge and I doubt any other medical condition or other [facility] requiring further screening, evaluation, or treatment in the ED at this time prior to discharge. Safe for discharge with strict return precautions.  Disposition: Discharge  Condition: Good  I have discussed the results, Dx and Tx plan with the patient/family  who expressed understanding and agree(s) with the plan. Discharge instructions discussed at length. The patient/family was given strict return precautions who verbalized understanding of the instructions. No further questions at time of discharge.    ED Discharge Orders          Ordered    methocarbamol (ROBAXIN) 500 MG tablet  2 times daily        02/13/22 2339             Follow Up: Tressie Stalker, MD [address] [place] [address] [phone]  Call  to schedule an appointment for close follow up  Clare Gandy, MD [address] [place] [address] [phone]  Call  to schedule an appointment for close follow up and discuss physical therapy for back pain           This chart was  dictated using voice recognition software.  Despite best efforts to proofread,  errors can occur which can change the documentation meaning.    Nira Conn, MD 02/14/22 [phone]

## 2022-02-13 NOTE — ED Triage Notes (Signed)
Pt reports with urinary frequency and right lower abdominal pain off and on for several months.  ?

## 2022-03-09 ENCOUNTER — Encounter (HOSPITAL_COMMUNITY): Payer: Self-pay | Admitting: Emergency Medicine

## 2022-03-09 ENCOUNTER — Other Ambulatory Visit: Payer: Self-pay

## 2022-03-09 ENCOUNTER — Emergency Department (HOSPITAL_COMMUNITY): Payer: Medicare PPO

## 2022-03-09 ENCOUNTER — Emergency Department (HOSPITAL_COMMUNITY)
Admission: EM | Admit: 2022-03-09 | Discharge: 2022-03-09 | Disposition: A | Discharge status: Home / Self Care | Payer: Medicare PPO | Attending: Emergency Medicine | Admitting: Emergency Medicine

## 2022-03-09 DIAGNOSIS — Z20822 Contact with and (suspected) exposure to covid-19: Secondary | ICD-10-CM | POA: Diagnosis not present

## 2022-03-09 DIAGNOSIS — Z7951 Long term (current) use of inhaled steroids: Secondary | ICD-10-CM | POA: Insufficient documentation

## 2022-03-09 DIAGNOSIS — J441 Chronic obstructive pulmonary disease with (acute) exacerbation: Secondary | ICD-10-CM | POA: Insufficient documentation

## 2022-03-09 DIAGNOSIS — R0602 Shortness of breath: Secondary | ICD-10-CM | POA: Diagnosis present

## 2022-03-09 LAB — CBC WITH DIFFERENTIAL/PLATELET
Abs Immature Granulocytes: 0.01 10*3/uL (ref 0.00–0.07)
Basophils Absolute: 0 10*3/uL (ref 0.0–0.1)
Basophils Relative: 0 %
Eosinophils Absolute: 0.1 10*3/uL (ref 0.0–0.5)
Eosinophils Relative: 1 %
HCT: 39 % (ref 36.0–46.0)
Hemoglobin: 12.6 g/dL (ref 12.0–15.0)
Immature Granulocytes: 0 %
Lymphocytes Relative: 17 %
Lymphs Abs: 1.2 10*3/uL (ref 0.7–4.0)
MCH: 32.6 pg (ref 26.0–34.0)
MCHC: 32.3 g/dL (ref 30.0–36.0)
MCV: 101 fL — ABNORMAL HIGH (ref 80.0–100.0)
Monocytes Absolute: 0.5 10*3/uL (ref 0.1–1.0)
Monocytes Relative: 8 %
Neutro Abs: 5.1 10*3/uL (ref 1.7–7.7)
Neutrophils Relative %: 74 %
Platelet Morphology: NORMAL
Platelets: 165 10*3/uL (ref 150–400)
RBC: 3.86 MIL/uL — ABNORMAL LOW (ref 3.87–5.11)
RDW: 13.2 % (ref 11.5–15.5)
WBC: 6.8 10*3/uL (ref 4.0–10.5)
nRBC: 0 % (ref 0.0–0.2)

## 2022-03-09 LAB — BASIC METABOLIC PANEL
Anion gap: 8 (ref 5–15)
BUN: 21 mg/dL — ABNORMAL HIGH (ref 6–20)
CO2: 27 mmol/L (ref 22–32)
Calcium: 9.8 mg/dL (ref 8.9–10.3)
Chloride: 108 mmol/L (ref 98–111)
Creatinine, Ser: 0.64 mg/dL (ref 0.44–1.00)
GFR, Estimated: >60 mL/min (ref >60)
Glucose, Bld: 95 mg/dL (ref 70–99)
Potassium: 4.2 mmol/L (ref 3.5–5.1)
Sodium: 143 mmol/L (ref 135–145)

## 2022-03-09 LAB — RESP PANEL BY RT-PCR (FLU A&B, COVID) ARPGX2
Influenza A by PCR: NEGATIVE
Influenza B by PCR: NEGATIVE
SARS Coronavirus 2 by RT PCR: NEGATIVE

## 2022-03-09 MED ORDER — METHYLPREDNISOLONE SODIUM SUCC 125 MG IJ SOLR
125.0000 mg | Freq: Once | INTRAMUSCULAR | Status: AC
  Administered 2022-03-09: 125 mg via INTRAVENOUS
  Filled 2022-03-09: qty 2

## 2022-03-09 MED ORDER — IPRATROPIUM-ALBUTEROL 0.5-2.5 (3) MG/3ML IN SOLN
3.0000 mL | Freq: Once | RESPIRATORY_TRACT | Status: AC
  Administered 2022-03-09: 3 mL via RESPIRATORY_TRACT
  Filled 2022-03-09: qty 3

## 2022-03-09 MED ORDER — PREDNISONE 10 MG PO TABS: 50.0000 mg | ORAL_TABLET | Freq: Every day | ORAL | 0 refills | Status: AC

## 2022-03-09 MED ORDER — HYDROXYZINE HCL 25 MG PO TABS
25.0000 mg | ORAL_TABLET | Freq: Once | ORAL | Status: AC
  Administered 2022-03-09: 25 mg via ORAL
  Filled 2022-03-09: qty 1

## 2022-03-09 MED ORDER — ALBUTEROL SULFATE (2.5 MG/3ML) 0.083% IN NEBU
5.0000 mg | INHALATION_SOLUTION | Freq: Once | RESPIRATORY_TRACT | Status: AC
  Administered 2022-03-09: 5 mg via RESPIRATORY_TRACT
  Filled 2022-03-09: qty 6

## 2022-03-09 NOTE — ED Triage Notes (Signed)
Patient reports feeling like she is having a COPD flare-up or has PNA again, has been coughing and feeling very weak since Monday. Denies fevers. States inhalers have not been helping.  ?

## 2022-03-09 NOTE — Discharge Instructions (Signed)
Prednisone as prescribed and complete the full course. ?Recheck with your primary care provider in the coming week.  Return to the emergency room for any worsening or concerning symptoms. ?

## 2022-03-09 NOTE — ED Provider Notes (Signed)
?[facility] DEPT ?Provider Note ? ? ?CSN: [id number] ?Arrival date & time: 03/09/22  [id number] ? ?  ? ?History ? ?Chief Complaint  ?Patient presents with  ? Cough  ? Shortness of Breath  ? ? ?Michele Lucas is a 56 y.o. female. ? ?56 year old female with history of COPD, neuropathy, fibromyalgia, grade 1 diastolic dysfunction presents with complaint of cough/wheezing/shob x 5 days. States she is moving out of her home and has been packing by herself all week. Unsure if she has pneumonia or COPD flare. Patient is using all of her inhalers as prescribed, increased use of her rescue inhaler with minimal relief. Denies changes in her chronic cough/sputum production, fevers, chills, sick contacts. Denies chest pain, leg swelling.  ?Is a former smoker. ? ? ?  ? ?Home Medications ?Prior to Admission medications   ?Medication Sig Start Date End Date Taking? Authorizing Provider  ?predniSONE (DELTASONE) 10 MG tablet Take 5 tablets (50 mg total) by mouth daily for 5 days. 03/09/22 03/14/22 Yes Tacy Learn, PA-C  ?albuterol (PROVENTIL HFA;VENTOLIN HFA) 108 (90 Base) MCG/ACT inhaler Inhale 2 puffs into the lungs 5 (five) times daily as needed for wheezing or shortness of breath.     [provider]  ?albuterol (PROVENTIL) (2.5 MG/3ML) 0.083% nebulizer solution Take 3 mLs (2.5 mg total) by nebulization every 6 (six) hours as needed for wheezing or shortness of breath. 01/07/22   Jacqlyn Larsen, PA-C  ?azithromycin (ZITHROMAX) 250 MG tablet Take 1 tablet (250 mg total) by mouth daily. Take first 2 tablets together, then 1 every day until finished. 01/07/22   Jacqlyn Larsen, PA-C  ?Buprenorphine HCl-Naloxone HCl 8-2 MG FILM Place 1 Film under the tongue daily. Last Fill 10-14-2021 07/09/21   [provider]  ?feeding supplement, GLUCERNA SHAKE, (GLUCERNA SHAKE) LIQD Take 237 mLs by mouth 3 (three) times daily between meals. ?Patient taking differently: Take 237 mLs by mouth 3 (three) times daily  between meals. Chocolate (only) 04/13/18   Oretha Milch D, MD  ?fluticasone-salmeterol (ADVAIR) 500-50 MCG/ACT AEPB Inhale 1 puff into the lungs in the morning and at bedtime.    [provider]  ?hydrOXYzine (ATARAX) 25 MG tablet Take 25 mg by mouth 3 (three) times daily as needed for anxiety.    [provider]  ?ipratropium-albuterol (DUONEB) 0.5-2.5 (3) MG/3ML SOLN Take 3 mLs by nebulization every 4 (four) hours as needed. ?Patient taking differently: Take 3 mLs by nebulization every 4 (four) hours as needed. Has medication ( no machine) 11/28/21   Debbe Odea, MD  ?methocarbamol (ROBAXIN) 500 MG tablet Take 1 tablet (500 mg total) by mouth 2 (two) times daily. 02/13/22   Fatima Blank, MD  ?   ? ?Allergies    ?Guaifenesin & derivatives, Cymbalta [duloxetine hcl], Ibuprofen, Neurontin [gabapentin], Prozac [fluoxetine hcl], Tylenol [acetaminophen], Ampicillin, Clindamycin/lincomycin, and Penicillins   ? ?Review of Systems   ?Review of Systems ?Negative except as per HPI ?Physical Exam ?Updated Vital Signs ?BP 119/72   Pulse 92   Temp 98.3 ?F (36.8 ?C) (Oral)   Resp 18   Ht 5\' 1"  (1.549 m)   Wt 42 kg   SpO2 97%   BMI 17.48 kg/m?  ?Physical Exam ?Vitals and nursing note reviewed.  ?Constitutional:   ?   General: She is not in acute distress. ?   Appearance: She is well-developed. She is not diaphoretic.  ?HENT:  ?   Head: Normocephalic and atraumatic.  ?Eyes:  ?  Conjunctiva/sclera: Conjunctivae normal.  ?Cardiovascular:  ?   Rate and Rhythm: Normal rate and regular rhythm.  ?   Heart sounds: Normal heart sounds.  ?Pulmonary:  ?   Effort: Pulmonary effort is normal.  ?   Breath sounds: Decreased air movement present.  ?Musculoskeletal:  ?   Cervical back: Neck supple.  ?   Right lower leg: No edema.  ?   Left lower leg: No edema.  ?Skin: ?   General: Skin is warm and dry.  ?   Findings: No erythema or rash.  ?Neurological:  ?   Mental Status: She is alert and oriented to person,  place, and time.  ?Psychiatric:     ?   Behavior: Behavior normal.  ? ? ?ED Results / Procedures / Treatments   ?Labs ?(all labs ordered are listed, but only abnormal results are displayed) ?Labs Reviewed  ?CBC WITH DIFFERENTIAL/PLATELET - Abnormal; Notable for the following components:  ?    Result Value  ? RBC 3.86 (*)   ? MCV 101.0 (*)   ? All other components within normal limits  ?BASIC METABOLIC PANEL - Abnormal; Notable for the following components:  ? BUN 21 (*)   ? All other components within normal limits  ?RESP PANEL BY RT-PCR (FLU A&B, COVID) [id number]  ? ? ?EKG ?EKG Interpretation ? ?Date/Time:  Saturday March 09 2022 08:55:56 EDT ?Ventricular Rate:  121 ?PR Interval:  96 ?QRS Duration: 67 ?QT Interval:  442 ?QTC Calculation: 628 ?R Axis:   86 ?Text Interpretation: Sinus tachycardia LVH with secondary repolarization abnormality Nonspecific T wave abnormality Confirmed by Lajean Saver [phone]) on 03/09/2022 9:39:04 AM ? ?Radiology ?DG Chest 2 View ? ?Result Date: 03/09/2022 ?CLINICAL DATA:  Shortness of breath and cough. EXAM: CHEST - 2 VIEW COMPARISON:  02/13/2022 FINDINGS: Heart size and mediastinal contours are normal. The lungs are hyperinflated and there are interstitial changes consistent with emphysema. No superimposed airspace consolidation. No pleural effusion or edema. The visualized osseous structures are unremarkable. IMPRESSION: 1. No active cardiopulmonary disease. 2. Emphysema. Electronically Signed   By: Kerby Moors M.D.   On: 03/09/2022 10:07   ? ?Procedures ?Procedures  ? ? ?Medications Ordered in ED ?Medications  ?ipratropium-albuterol (DUONEB) 0.5-2.5 (3) MG/3ML nebulizer solution 3 mL (3 mLs Nebulization Given 03/09/22 1035)  ?methylPREDNISolone sodium succinate (SOLU-MEDROL) 125 mg/2 mL injection 125 mg (125 mg Intravenous Given 03/09/22 1035)  ?hydrOXYzine (ATARAX) tablet 25 mg (25 mg Oral Given 03/09/22 1035)  ?albuterol (PROVENTIL) (2.5 MG/3ML) 0.083% nebulizer solution 5 mg (5 mg  Nebulization Given 03/09/22 1154)  ? ? ?ED Course/ Medical Decision Making/ A&P ?  ?                        ?Medical Decision Making ?Amount and/or Complexity of Data Reviewed ?Labs: ordered. ?Radiology: ordered. ? ?Risk ?Prescription drug management. ? ? ?This patient presents to the ED for concern of shortness of breath, wheezing, cough, this involves an extensive number of treatment options, and is a complaint that carries with it a high risk of complications and morbidity.  The differential diagnosis includes but not limited to COPD exacerbation, pna, CHF, COVID ? ? ?Co morbidities that complicate the patient evaluation ? ?COPD, fibromyalgia, neuropathy  ? ? ?Additional history obtained: ? ?External records from outside source obtained and reviewed including call to her [place] provider for medication assistance on 02/28/22, no visible plan of care for med change.  ? ? ?Lab Tests: ? ?  I Ordered, and personally interpreted labs.  The pertinent results include:  CBC with normal WBC. Covid/flu negative, BMP without significant changes ? ? ?Imaging Studies ordered: ? ?I ordered imaging studies including CXR  ?I independently visualized and interpreted imaging which showed no acute disease, COPD ?I agree with the radiologist interpretation ? ? ?Cardiac Monitoring: / EKG: ? ?The patient was maintained on a cardiac monitor.  I personally viewed and interpreted the cardiac monitored which showed an underlying rhythm of: Sinus rhythm, patient tachycardic while tearful however heart rate returns into the 90s when calm. ? ? ?Problem List / ED Course / Critical interventions / Medication management ? ?56 year old female with history of COPD, former smoker, presents with concern for COPD exacerbation versus pneumonia.  Patient is using her rescue inhaler more frequently with limited relief.  She denies fevers or chills, changes in her sputum or cough.  On exam, lung sounds diminished throughout.  Tearful otherwise  well-appearing. ?Chest x-ray negative for pneumonia.  Improved with treatment, recommend continue prednisone at home for the next several days with recheck with PCP and return to ER precautions.  ?I ordered medication including DuoNeb

## 2022-04-20 ENCOUNTER — Encounter (HOSPITAL_COMMUNITY): Payer: Self-pay

## 2022-04-20 ENCOUNTER — Emergency Department (HOSPITAL_COMMUNITY): Payer: Medicare PPO

## 2022-04-20 ENCOUNTER — Other Ambulatory Visit: Payer: Self-pay

## 2022-04-20 ENCOUNTER — Emergency Department (HOSPITAL_COMMUNITY)
Admission: EM | Admit: 2022-04-20 | Discharge: 2022-04-20 | Disposition: A | Discharge status: Home / Self Care | Payer: Medicare PPO | Attending: Emergency Medicine | Admitting: Emergency Medicine

## 2022-04-20 DIAGNOSIS — R0789 Other chest pain: Secondary | ICD-10-CM | POA: Diagnosis present

## 2022-04-20 DIAGNOSIS — R000 Tachycardia, unspecified: Secondary | ICD-10-CM | POA: Diagnosis not present

## 2022-04-20 DIAGNOSIS — M79602 Pain in left arm: Secondary | ICD-10-CM | POA: Diagnosis not present

## 2022-04-20 LAB — BASIC METABOLIC PANEL
Anion gap: 6 (ref 5–15)
BUN: 11 mg/dL (ref 6–20)
CO2: 30 mmol/L (ref 22–32)
Calcium: 9.9 mg/dL (ref 8.9–10.3)
Chloride: 106 mmol/L (ref 98–111)
Creatinine, Ser: 0.71 mg/dL (ref 0.44–1.00)
GFR, Estimated: >60 mL/min (ref >60)
Glucose, Bld: 104 mg/dL — ABNORMAL HIGH (ref 70–99)
Potassium: 3.6 mmol/L (ref 3.5–5.1)
Sodium: 142 mmol/L (ref 135–145)

## 2022-04-20 LAB — CBC
HCT: 41.3 % (ref 36.0–46.0)
Hemoglobin: 13.3 g/dL (ref 12.0–15.0)
MCH: 32.8 pg (ref 26.0–34.0)
MCHC: 32.2 g/dL (ref 30.0–36.0)
MCV: 101.7 fL — ABNORMAL HIGH (ref 80.0–100.0)
Platelets: 148 10*3/uL — ABNORMAL LOW (ref 150–400)
RBC: 4.06 MIL/uL (ref 3.87–5.11)
RDW: 12.2 % (ref 11.5–15.5)
WBC: 5.8 10*3/uL (ref 4.0–10.5)
nRBC: 0 % (ref 0.0–0.2)

## 2022-04-20 LAB — D-DIMER, QUANTITATIVE: D-Dimer, Quant: 0.31 ug/mL-FEU (ref 0.00–0.50)

## 2022-04-20 LAB — TROPONIN I (HIGH SENSITIVITY)
Troponin I (High Sensitivity): 4 ng/L (ref <18)
Troponin I (High Sensitivity): 3 ng/L (ref <18)

## 2022-04-20 MED ORDER — DIAZEPAM 5 MG PO TABS
5.0000 mg | ORAL_TABLET | Freq: Once | ORAL | Status: AC
Start: 2022-04-20 — End: 2022-04-20
  Administered 2022-04-20: 5 mg via ORAL
  Filled 2022-04-20: qty 1

## 2022-04-20 MED ORDER — DIAZEPAM 2 MG PO TABS: 2.0000 mg | ORAL_TABLET | Freq: Four times a day (QID) | ORAL | 0 refills | Status: DC | PRN

## 2022-04-20 MED ORDER — OXYCODONE HCL 5 MG PO TABS
5.0000 mg | ORAL_TABLET | ORAL | Status: AC
  Administered 2022-04-20: 5 mg via ORAL
  Filled 2022-04-20: qty 1

## 2022-04-20 MED ORDER — HYDROCODONE-ACETAMINOPHEN 5-325 MG PO TABS: 1.0000 | ORAL_TABLET | Freq: Once | ORAL | Status: DC

## 2022-04-20 NOTE — ED Triage Notes (Signed)
Pt reports pain that originates in L arm radiating into shoulder and L chest. Pt denies recent injury, SOB, N/V.  ?

## 2022-04-20 NOTE — ED Provider Notes (Addendum)
?[facility]-EMERGENCY DEPT ?Provider Note ? ? ?CSN: [id number] ?Arrival date & time: 04/20/22  1534 ? ?  ? ?History ? ?Chief Complaint  ?Patient presents with  ? Chest Pain  ? Arm Pain  ? ? ?Michele Lucas is a 56 y.o. female. ? ?56 year old female presents with 1 week of sharp muscle pain at center left arm which radiates to her left chest as well as to her left back.  She has a history of fibromyalgia.  Denies any anginal type symptoms.  No fever cough congestion.  No rashes noted.  Pain is positional and unrelieved with her home medications.  Denies any history of trauma although she states she does drive with her left hand ? ? ?  ? ?Home Medications ?Prior to Admission medications   ?Medication Sig Start Date End Date Taking? Authorizing Provider  ?albuterol (PROVENTIL HFA;VENTOLIN HFA) 108 (90 Base) MCG/ACT inhaler Inhale 2 puffs into the lungs 5 (five) times daily as needed for wheezing or shortness of breath.     [provider]  ?albuterol (PROVENTIL) (2.5 MG/3ML) 0.083% nebulizer solution Take 3 mLs (2.5 mg total) by nebulization every 6 (six) hours as needed for wheezing or shortness of breath. 01/07/22   Dartha LodgeFord, Kelsey N, PA-C  ?azithromycin (ZITHROMAX) 250 MG tablet Take 1 tablet (250 mg total) by mouth daily. Take first 2 tablets together, then 1 every day until finished. 01/07/22   Dartha LodgeFord, Kelsey N, PA-C  ?Buprenorphine HCl-Naloxone HCl 8-2 MG FILM Place 1 Film under the tongue daily. Last Fill 10-14-2021 07/09/21   [provider]  ?feeding supplement, GLUCERNA SHAKE, (GLUCERNA SHAKE) LIQD Take 237 mLs by mouth 3 (three) times daily between meals. ?Patient taking differently: Take 237 mLs by mouth 3 (three) times daily between meals. Chocolate (only) 04/13/18   Roberto ScalesNettey, Shayla D, MD  ?fluticasone-salmeterol (ADVAIR) 500-50 MCG/ACT AEPB Inhale 1 puff into the lungs in the morning and at bedtime.    [provider]  ?hydrOXYzine (ATARAX) 25 MG tablet Take 25 mg by mouth  3 (three) times daily as needed for anxiety.    [provider]  ?ipratropium-albuterol (DUONEB) 0.5-2.5 (3) MG/3ML SOLN Take 3 mLs by nebulization every 4 (four) hours as needed. ?Patient taking differently: Take 3 mLs by nebulization every 4 (four) hours as needed. Has medication ( no machine) 11/28/21   Calvert Cantorizwan, Saima, MD  ?methocarbamol (ROBAXIN) 500 MG tablet Take 1 tablet (500 mg total) by mouth 2 (two) times daily. 02/13/22   Nira Connardama, Pedro Eduardo, MD  ?   ? ?Allergies    ?Guaifenesin & derivatives, Cymbalta [duloxetine hcl], Ibuprofen, Neurontin [gabapentin], Prozac [fluoxetine hcl], Tylenol [acetaminophen], Ampicillin, Clindamycin/lincomycin, and Penicillins   ? ?Review of Systems   ?Review of Systems  ?All other systems reviewed and are negative. ? ?Physical Exam ?Updated Vital Signs ?BP (!) 138/95 (BP Location: Right Arm)   Pulse (!) 112   Temp 98.3 ?F (36.8 ?C) (Oral)   Resp 16   Ht 1.575 m (5\' 2" )   Wt 40.3 kg   SpO2 91%   BMI 16.24 kg/m?  ?Physical Exam ?Vitals and nursing note reviewed.  ?Constitutional:   ?   General: She is not in acute distress. ?   Appearance: Normal appearance. She is well-developed. She is not toxic-appearing.  ?HENT:  ?   Head: Normocephalic and atraumatic.  ?Eyes:  ?   General: Lids are normal.  ?   Conjunctiva/sclera: Conjunctivae normal.  ?   Pupils: Pupils are equal, round,  and reactive to light.  ?Neck:  ?   Thyroid: No thyroid mass.  ?   Trachea: No tracheal deviation.  ?Cardiovascular:  ?   Rate and Rhythm: Normal rate and regular rhythm.  ?   Heart sounds: Normal heart sounds. No murmur heard. ?  No gallop.  ?Pulmonary:  ?   Effort: Pulmonary effort is normal. No respiratory distress.  ?   Breath sounds: Normal breath sounds. No stridor. No decreased breath sounds, wheezing, rhonchi or rales.  ?Chest:  ? ? ?Abdominal:  ?   General: There is no distension.  ?   Palpations: Abdomen is soft.  ?   Tenderness: There is no abdominal tenderness. There is no  rebound.  ?Musculoskeletal:     ?   General: No tenderness. Normal range of motion.  ?   Cervical back: Normal range of motion and neck supple.  ?Skin: ?   General: Skin is warm and dry.  ?   Findings: No abrasion or rash.  ?Neurological:  ?   Mental Status: She is alert and oriented to person, place, and time. Mental status is at baseline.  ?   GCS: GCS eye subscore is 4. GCS verbal subscore is 5. GCS motor subscore is 6.  ?   Cranial Nerves: No cranial nerve deficit.  ?   Sensory: No sensory deficit.  ?   Motor: Motor function is intact.  ?Psychiatric:     ?   Attention and Perception: Attention normal.     ?   Speech: Speech normal.     ?   Behavior: Behavior normal.  ? ? ?ED Results / Procedures / Treatments   ?Labs ?(all labs ordered are listed, but only abnormal results are displayed) ?Labs Reviewed  ?BASIC METABOLIC PANEL - Abnormal; Notable for the following components:  ?    Result Value  ? Glucose, Bld 104 (*)   ? All other components within normal limits  ?CBC - Abnormal; Notable for the following components:  ? MCV 101.7 (*)   ? Platelets 148 (*)   ? All other components within normal limits  ?D-DIMER, QUANTITATIVE  ?TROPONIN I (HIGH SENSITIVITY)  ?TROPONIN I (HIGH SENSITIVITY)  ? ? ?EKG ?EKG Interpretation ? ?Date/Time:  Saturday Apr 20 2022 15:54:25 EDT ?Ventricular Rate:  113 ?PR Interval:  144 ?QRS Duration: 68 ?QT Interval:  333 ?QTC Calculation: 457 ?R Axis:   82 ?Text Interpretation: Sinus tachycardia Right atrial enlargement LVH with secondary repolarization abnormality Confirmed by Lorre Nick ([id number]) on 04/20/2022 6:49:36 PM ? ?Radiology ?DG Chest Port 1 View ? ?Result Date: 04/20/2022 ?CLINICAL DATA:  Chest and left arm pain. EXAM: PORTABLE CHEST 1 VIEW COMPARISON:  03/09/2022 FINDINGS: The cardiac silhouette, mediastinal and hilar contours are within normal limits given the AP projection and portable technique. The lungs are clear of an acute process. No pleural effusions or pulmonary  lesions. IMPRESSION: No acute cardiopulmonary findings. Electronically Signed   By: Rudie Meyer M.D.   On: 04/20/2022 16:21   ? ?Procedures ?Procedures  ? ? ?Medications Ordered in ED ?Medications  ?HYDROcodone-acetaminophen (NORCO/VICODIN) 5-325 MG per tablet 1 tablet (has no administration in time range)  ?diazepam (VALIUM) tablet 5 mg (has no administration in time range)  ? ? ?ED Course/ Medical Decision Making/ A&P ?  ?                        ?Medical Decision Making ?Risk ?Prescription drug management. ? ?Chest x-ray  per my interpretation shows no acute findings. ?Patient's EKG per my interpretation shows sinus tachycardia.  Consider etiologies such as PE but with negative D-dimer and no other factors for less likely.  Considered ACS and troponin negative.  Patient's pain is reproducible here.  And patient treated with analgesics as well as muscle axis and after reassessment feels much better.  Patient initially tachycardic and likely from pain response and that improved after meds.  Patient has history of fibromyalgia and feel that there is a component of her current symptoms that are related to this.  Patient's heart score is 1.  Will discharge home ? ? ? ? ? ? ? ?Final Clinical Impression(s) / ED Diagnoses ?Final diagnoses:  ?None  ? ? ?Rx / DC Orders ?ED Discharge Orders   ? ? None  ? ?  ? ? ?  ?Lorre Nick, MD ?04/20/22 2048 ? ?  ?Lorre Nick, MD ?04/20/22 2049 ? ?

## 2022-04-20 NOTE — ED Provider Triage Note (Signed)
Emergency Medicine Provider Triage Evaluation Note ? ?Michele Lucas , a 56 y.o. female  was evaluated in triage.  Pt complains of left-sided arm, shoulder, and chest pain.  Patient states that the pain began approximately 1 week ago.  Patient denies any physical activity at the time of the onset of pain.  Patient states that certain movements do seem to slightly alleviate the pain.  Patient states that she does have shortness of breath but that she does not consider it to be worse than her normal COPD at baseline.  She has used rescue inhalers over the past week.  Other than certain movements nothing seems to alleviate the pain.  Denies nausea, vomiting, abdominal pain.  Endorses chest pain, left arm pain, left shoulder pain, shortness of breath ? ?Review of Systems  ?Positive: Chest pain, left arm pain, left shoulder pain, shortness of breath ?Negative: Abdominal pain, nausea, vomiting ? ?Physical Exam  ?BP (!) 138/95 (BP Location: Right Arm)   Pulse (!) 112   Temp 98.3 ?F (36.8 ?C) (Oral)   Resp 16   Ht 5\' 2"  (1.575 m)   Wt 40.3 kg   SpO2 91%   BMI 16.24 kg/m?  ?Gen:   Awake, no distress   ?Resp:  Normal effort  ?MSK:   Moves extremities without difficulty  ?Other:  Upper left arm, left shoulder, upper left chest tender to palpation ? ?Medical Decision Making  ?Medically screening exam initiated at 3:53 PM.  Appropriate orders placed.  Michele Lucas was informed that the remainder of the evaluation will be completed by another provider, this initial triage assessment does not replace that evaluation, and the importance of remaining in the ED until their evaluation is complete. ? ? ?  ?Tamala Bari, PA-C ?04/20/22 1555 ? ?

## 2022-04-25 ENCOUNTER — Ambulatory Visit: Payer: Self-pay

## 2022-04-25 NOTE — Telephone Encounter (Signed)
  Chief Complaint: shoulder pain Symptoms: L shoulder pain, 12/10 pain, constant Frequency: 1 week  Pertinent Negatives: NA Disposition: [] ED /[x] Urgent Care (no appt availability in office) / [] Appointment(In office/virtual)/ []  [facility]/ [] Home Care/ [] Refused Recommended Disposition /[] [facility]/ []  Follow-up with PCP Additional Notes: pt was calling to see if ED Dr. Zenia Resides could refill diazepam for muscle spasms even though they havent really helped. I advised her the ED providers wont do refill she would have to go back to be seen. Pt states she tried to get in with PCP but unable at this time. I advised pt if she didn't want to go back to ED she could go to [facility] and be seen. Pt states she will go there now.   Reason for Disposition  [1] Age > 40 AND [2] no obvious cause AND [3] pain even when not moving the arm  (Exception: pain is clearly made worse by moving arm or bending neck)  Answer Assessment - Initial Assessment Questions 1. ONSET: "When did the pain start?"     Last week  2. LOCATION: "Where is the pain located?"     Left  3. PAIN: "How bad is the pain?" (Scale 1-10; or mild, moderate, severe)   - MILD (1-3): doesn't interfere with normal activities   - MODERATE (4-7): interferes with normal activities (e.g., work or school) or awakens from sleep   - SEVERE (8-10): excruciating pain, unable to do any normal activities, unable to move arm at all due to pain     12 6. OTHER SYMPTOMS: "Do you have any other symptoms?" (e.g., neck pain, swelling, rash, fever, numbness, weakness)  Protocols used: Shoulder Pain-A-AH

## 2022-05-24 ENCOUNTER — Other Ambulatory Visit: Payer: Self-pay

## 2022-05-24 ENCOUNTER — Emergency Department (HOSPITAL_COMMUNITY): Payer: Medicare PPO

## 2022-05-24 ENCOUNTER — Observation Stay (HOSPITAL_COMMUNITY): Payer: Medicare PPO

## 2022-05-24 ENCOUNTER — Inpatient Hospital Stay (HOSPITAL_COMMUNITY)
Admission: EM | Admit: 2022-05-24 | Discharge: 2022-05-27 | DRG: 193 | Disposition: A | Discharge status: Home / Self Care | Payer: Medicare PPO | Attending: Internal Medicine | Admitting: Internal Medicine

## 2022-05-24 ENCOUNTER — Encounter (HOSPITAL_COMMUNITY): Payer: Self-pay

## 2022-05-24 DIAGNOSIS — R0602 Shortness of breath: Secondary | ICD-10-CM | POA: Diagnosis not present

## 2022-05-24 DIAGNOSIS — F064 Anxiety disorder due to known physiological condition: Secondary | ICD-10-CM | POA: Diagnosis present

## 2022-05-24 DIAGNOSIS — J9602 Acute respiratory failure with hypercapnia: Secondary | ICD-10-CM

## 2022-05-24 DIAGNOSIS — J9622 Acute and chronic respiratory failure with hypercapnia: Secondary | ICD-10-CM | POA: Diagnosis present

## 2022-05-24 DIAGNOSIS — Z20822 Contact with and (suspected) exposure to covid-19: Secondary | ICD-10-CM | POA: Diagnosis present

## 2022-05-24 DIAGNOSIS — F411 Generalized anxiety disorder: Secondary | ICD-10-CM | POA: Diagnosis present

## 2022-05-24 DIAGNOSIS — Z886 Allergy status to analgesic agent status: Secondary | ICD-10-CM

## 2022-05-24 DIAGNOSIS — R0609 Other forms of dyspnea: Secondary | ICD-10-CM | POA: Diagnosis not present

## 2022-05-24 DIAGNOSIS — D696 Thrombocytopenia, unspecified: Secondary | ICD-10-CM | POA: Diagnosis present

## 2022-05-24 DIAGNOSIS — Z880 Allergy status to penicillin: Secondary | ICD-10-CM

## 2022-05-24 DIAGNOSIS — J441 Chronic obstructive pulmonary disease with (acute) exacerbation: Secondary | ICD-10-CM | POA: Diagnosis present

## 2022-05-24 DIAGNOSIS — Z7951 Long term (current) use of inhaled steroids: Secondary | ICD-10-CM

## 2022-05-24 DIAGNOSIS — J439 Emphysema, unspecified: Secondary | ICD-10-CM | POA: Diagnosis present

## 2022-05-24 DIAGNOSIS — M797 Fibromyalgia: Secondary | ICD-10-CM | POA: Diagnosis present

## 2022-05-24 DIAGNOSIS — R636 Underweight: Secondary | ICD-10-CM | POA: Diagnosis present

## 2022-05-24 DIAGNOSIS — G8929 Other chronic pain: Secondary | ICD-10-CM | POA: Diagnosis present

## 2022-05-24 DIAGNOSIS — J9621 Acute and chronic respiratory failure with hypoxia: Secondary | ICD-10-CM | POA: Diagnosis present

## 2022-05-24 DIAGNOSIS — Z720 Tobacco use: Secondary | ICD-10-CM

## 2022-05-24 DIAGNOSIS — J189 Pneumonia, unspecified organism: Secondary | ICD-10-CM | POA: Diagnosis not present

## 2022-05-24 DIAGNOSIS — J9601 Acute respiratory failure with hypoxia: Secondary | ICD-10-CM | POA: Diagnosis not present

## 2022-05-24 DIAGNOSIS — E43 Unspecified severe protein-calorie malnutrition: Secondary | ICD-10-CM | POA: Diagnosis present

## 2022-05-24 DIAGNOSIS — R64 Cachexia: Secondary | ICD-10-CM | POA: Diagnosis present

## 2022-05-24 DIAGNOSIS — F40240 Claustrophobia: Secondary | ICD-10-CM | POA: Diagnosis present

## 2022-05-24 DIAGNOSIS — Z79899 Other long term (current) drug therapy: Secondary | ICD-10-CM

## 2022-05-24 DIAGNOSIS — Z681 Body mass index (BMI) 19 or less, adult: Secondary | ICD-10-CM

## 2022-05-24 DIAGNOSIS — M549 Dorsalgia, unspecified: Secondary | ICD-10-CM | POA: Diagnosis present

## 2022-05-24 DIAGNOSIS — Z881 Allergy status to other antibiotic agents status: Secondary | ICD-10-CM

## 2022-05-24 DIAGNOSIS — Z888 Allergy status to other drugs, medicaments and biological substances status: Secondary | ICD-10-CM

## 2022-05-24 DIAGNOSIS — F4310 Post-traumatic stress disorder, unspecified: Secondary | ICD-10-CM | POA: Diagnosis present

## 2022-05-24 DIAGNOSIS — I5189 Other ill-defined heart diseases: Secondary | ICD-10-CM

## 2022-05-24 LAB — RESPIRATORY PANEL BY PCR

## 2022-05-24 LAB — BASIC METABOLIC PANEL
Anion gap: 8 (ref 5–15)
BUN: 10 mg/dL (ref 6–20)
CO2: 30 mmol/L (ref 22–32)
Calcium: 9 mg/dL (ref 8.9–10.3)
Chloride: 100 mmol/L (ref 98–111)
Creatinine, Ser: 0.49 mg/dL (ref 0.44–1.00)
GFR, Estimated: >60 mL/min (ref >60)
Glucose, Bld: 140 mg/dL — ABNORMAL HIGH (ref 70–99)
Potassium: 4.2 mmol/L (ref 3.5–5.1)
Sodium: 138 mmol/L (ref 135–145)

## 2022-05-24 LAB — STREP PNEUMONIAE URINARY ANTIGEN: Strep Pneumo Urinary Antigen: NEGATIVE

## 2022-05-24 LAB — RAPID URINE DRUG SCREEN, HOSP PERFORMED
Amphetamines: NOT DETECTED
Barbiturates: NOT DETECTED
Benzodiazepines: NOT DETECTED
Cocaine: NOT DETECTED
Opiates: NOT DETECTED
Tetrahydrocannabinol: NOT DETECTED

## 2022-05-24 LAB — ECHOCARDIOGRAM COMPLETE
AR max vel: 1.04 cm2
AV Area VTI: 0.73 cm2
AV Area mean vel: 0.82 cm2
AV Mean grad: 18 mmHg
AV Peak grad: 29.1 mmHg
Ao pk vel: 2.7 m/s
Height: 62 in
P 1/2 time: 472 msec
S' Lateral: 2.6 cm
Weight: 1408 oz

## 2022-05-24 LAB — CBC WITH DIFFERENTIAL/PLATELET
Abs Immature Granulocytes: 0.03 10*3/uL (ref 0.00–0.07)
Basophils Absolute: 0 10*3/uL (ref 0.0–0.1)
Basophils Relative: 0 %
Eosinophils Absolute: 0 10*3/uL (ref 0.0–0.5)
Eosinophils Relative: 0 %
HCT: 39.5 % (ref 36.0–46.0)
Hemoglobin: 12.2 g/dL (ref 12.0–15.0)
Immature Granulocytes: 0 %
Lymphocytes Relative: 3 %
Lymphs Abs: 0.3 10*3/uL — ABNORMAL LOW (ref 0.7–4.0)
MCH: 31.2 pg (ref 26.0–34.0)
MCHC: 30.9 g/dL (ref 30.0–36.0)
MCV: 101 fL — ABNORMAL HIGH (ref 80.0–100.0)
Monocytes Absolute: 0.2 10*3/uL (ref 0.1–1.0)
Monocytes Relative: 1 %
Neutro Abs: 10.5 10*3/uL — ABNORMAL HIGH (ref 1.7–7.7)
Neutrophils Relative %: 96 %
Platelets: 122 10*3/uL — ABNORMAL LOW (ref 150–400)
RBC: 3.91 MIL/uL (ref 3.87–5.11)
RDW: 12.1 % (ref 11.5–15.5)
WBC: 10.9 10*3/uL — ABNORMAL HIGH (ref 4.0–10.5)
nRBC: 0 % (ref 0.0–0.2)

## 2022-05-24 LAB — HEPATIC FUNCTION PANEL
ALT: 13 U/L (ref 0–44)
AST: 18 U/L (ref 15–41)
Albumin: 4.2 g/dL (ref 3.5–5.0)
Alkaline Phosphatase: 56 U/L (ref 38–126)
Bilirubin, Direct: 0.2 mg/dL (ref 0.0–0.2)
Indirect Bilirubin: 1.3 mg/dL — ABNORMAL HIGH (ref 0.3–0.9)
Total Bilirubin: 1.5 mg/dL — ABNORMAL HIGH (ref 0.3–1.2)
Total Protein: 7.4 g/dL (ref 6.5–8.1)

## 2022-05-24 LAB — BLOOD GAS, ARTERIAL
Acid-Base Excess: 4.2 mmol/L — ABNORMAL HIGH (ref 0.0–2.0)
Bicarbonate: 31.1 mmol/L — ABNORMAL HIGH (ref 20.0–28.0)
O2 Saturation: 80.7 %
Patient temperature: 36.6
pCO2 arterial: 54 mmHg — ABNORMAL HIGH (ref 32–48)
pH, Arterial: 7.37 (ref 7.35–7.45)
pO2, Arterial: 46 mmHg — ABNORMAL LOW (ref 83–108)

## 2022-05-24 LAB — BLOOD GAS, VENOUS
Acid-Base Excess: 3.2 mmol/L — ABNORMAL HIGH (ref 0.0–2.0)
Bicarbonate: 32 mmol/L — ABNORMAL HIGH (ref 20.0–28.0)
O2 Saturation: 96.9 %
Patient temperature: 37
pCO2, Ven: 68 mmHg — ABNORMAL HIGH (ref 44–60)
pH, Ven: 7.28 (ref 7.25–7.43)
pO2, Ven: 77 mmHg — ABNORMAL HIGH (ref 32–45)

## 2022-05-24 LAB — BRAIN NATRIURETIC PEPTIDE: B Natriuretic Peptide: 37.7 pg/mL (ref 0.0–100.0)

## 2022-05-24 LAB — TROPONIN I (HIGH SENSITIVITY)
Troponin I (High Sensitivity): 6 ng/L (ref <18)
Troponin I (High Sensitivity): 6 ng/L (ref <18)

## 2022-05-24 LAB — PHOSPHORUS: Phosphorus: 2.8 mg/dL (ref 2.5–4.6)

## 2022-05-24 LAB — PROCALCITONIN: Procalcitonin: <0.1 ng/mL

## 2022-05-24 LAB — SARS CORONAVIRUS 2 BY RT PCR: SARS Coronavirus 2 by RT PCR: NEGATIVE

## 2022-05-24 MED ORDER — SODIUM CHLORIDE (PF) 0.9 % IJ SOLN
INTRAMUSCULAR | Status: AC
  Filled 2022-05-24: qty 50

## 2022-05-24 MED ORDER — LORAZEPAM 2 MG/ML IJ SOLN
0.5000 mg | Freq: Once | INTRAMUSCULAR | Status: AC
  Administered 2022-05-24: 0.5 mg via INTRAVENOUS
  Filled 2022-05-24: qty 1

## 2022-05-24 MED ORDER — SODIUM CHLORIDE 0.9 % IV SOLN
1.0000 g | Freq: Once | INTRAVENOUS | Status: AC
  Administered 2022-05-24: 1 g via INTRAVENOUS
  Filled 2022-05-24: qty 10

## 2022-05-24 MED ORDER — METHOCARBAMOL 500 MG PO TABS
250.0000 mg | ORAL_TABLET | Freq: Once | ORAL | Status: DC
  Filled 2022-05-24: qty 1

## 2022-05-24 MED ORDER — ENSURE ENLIVE PO LIQD
237.0000 mL | Freq: Two times a day (BID) | ORAL | Status: DC
  Administered 2022-05-25: 237 mL via ORAL

## 2022-05-24 MED ORDER — SODIUM CHLORIDE 0.9 % IV BOLUS
500.0000 mL | Freq: Once | INTRAVENOUS | Status: AC
  Administered 2022-05-24: 500 mL via INTRAVENOUS

## 2022-05-24 MED ORDER — ALBUTEROL SULFATE (2.5 MG/3ML) 0.083% IN NEBU
5.0000 mg | INHALATION_SOLUTION | Freq: Once | RESPIRATORY_TRACT | Status: AC
  Administered 2022-05-24: 5 mg via RESPIRATORY_TRACT
  Filled 2022-05-24: qty 6

## 2022-05-24 MED ORDER — PREDNISONE 20 MG PO TABS: 40.0000 mg | ORAL_TABLET | Freq: Every day | ORAL | Status: DC

## 2022-05-24 MED ORDER — ALBUTEROL SULFATE (2.5 MG/3ML) 0.083% IN NEBU
2.5000 mg | INHALATION_SOLUTION | RESPIRATORY_TRACT | Status: DC | PRN
Start: 2022-05-24 — End: 2022-05-28
  Administered 2022-05-25: 2.5 mg via RESPIRATORY_TRACT
  Filled 2022-05-24: qty 3

## 2022-05-24 MED ORDER — ENOXAPARIN SODIUM 30 MG/0.3ML IJ SOSY
30.0000 mg | PREFILLED_SYRINGE | Freq: Every day | INTRAMUSCULAR | Status: DC
  Administered 2022-05-25 – 2022-05-27 (×3): 30 mg via SUBCUTANEOUS
  Filled 2022-05-24 (×4): qty 0.3

## 2022-05-24 MED ORDER — IPRATROPIUM-ALBUTEROL 0.5-2.5 (3) MG/3ML IN SOLN: 1.5000 mL | Freq: Three times a day (TID) | RESPIRATORY_TRACT | Status: DC

## 2022-05-24 MED ORDER — IOHEXOL 350 MG/ML SOLN
75.0000 mL | Freq: Once | INTRAVENOUS | Status: AC | PRN
  Administered 2022-05-24: 75 mL via INTRAVENOUS

## 2022-05-24 MED ORDER — ALBUTEROL SULFATE (2.5 MG/3ML) 0.083% IN NEBU
INHALATION_SOLUTION | RESPIRATORY_TRACT | Status: AC
  Filled 2022-05-24: qty 3

## 2022-05-24 MED ORDER — SODIUM CHLORIDE 0.9 % IV SOLN: 2.0000 g | INTRAVENOUS | Status: DC

## 2022-05-24 MED ORDER — LORAZEPAM 2 MG/ML IJ SOLN
0.5000 mg | INTRAMUSCULAR | Status: DC | PRN
Start: 2022-05-24 — End: 2022-05-28
  Administered 2022-05-24 – 2022-05-26 (×7): 0.5 mg via INTRAVENOUS
  Filled 2022-05-24 (×7): qty 1

## 2022-05-24 MED ORDER — PREDNISONE 5 MG/ML PO CONC
40.0000 mg | Freq: Every day | ORAL | Status: DC
  Filled 2022-05-24: qty 8

## 2022-05-24 MED ORDER — IPRATROPIUM-ALBUTEROL 0.5-2.5 (3) MG/3ML IN SOLN
1.5000 mL | Freq: Four times a day (QID) | RESPIRATORY_TRACT | Status: DC
  Administered 2022-05-24 – 2022-05-25 (×4): 1.5 mL via RESPIRATORY_TRACT
  Filled 2022-05-24 (×4): qty 3

## 2022-05-24 MED ORDER — GLUCERNA SHAKE PO LIQD
237.0000 mL | Freq: Three times a day (TID) | ORAL | Status: DC
  Administered 2022-05-25 – 2022-05-26 (×4): 237 mL via ORAL
  Filled 2022-05-24 (×6): qty 237

## 2022-05-24 MED ORDER — SODIUM CHLORIDE 0.9 % IV SOLN
500.0000 mg | INTRAVENOUS | Status: DC
  Administered 2022-05-25: 500 mg via INTRAVENOUS
  Filled 2022-05-24: qty 5

## 2022-05-24 MED ORDER — IPRATROPIUM-ALBUTEROL 0.5-2.5 (3) MG/3ML IN SOLN
6.0000 mL | Freq: Once | RESPIRATORY_TRACT | Status: AC
  Administered 2022-05-24: 6 mL via RESPIRATORY_TRACT
  Filled 2022-05-24: qty 3

## 2022-05-24 MED ORDER — SODIUM CHLORIDE 0.9 % IV SOLN
500.0000 mg | Freq: Once | INTRAVENOUS | Status: AC
  Administered 2022-05-24: 500 mg via INTRAVENOUS
  Filled 2022-05-24: qty 5

## 2022-05-24 NOTE — Plan of Care (Signed)
  Problem: Respiratory: Goal: Ability to maintain a clear airway will improve Outcome: Progressing   Problem: Clinical Measurements: Goal: Respiratory complications will improve Outcome: Progressing   Problem: Coping: Goal: Level of anxiety will decrease Outcome: Progressing

## 2022-05-24 NOTE — Progress Notes (Signed)
Pt placed on cpap per order, tolerating will at this time.

## 2022-05-24 NOTE — ED Provider Notes (Signed)
[facility]-EMERGENCY DEPT Provider Note   CSN: [id number] Arrival date & time: 05/24/22  7342     History  No chief complaint on file.   Michele Lucas is a 56 y.o. female with a past medical history of COPD, chronic pain, fibromyalgia, severe protein calorie malnutrition and nicotine dependence and a history of spontaneous pneumothorax who presents to the emergency department with chief complaint of shortness of breath.  Patient complains of worsening shortness of breath over the past 8 hours.  This has been progressive in nature.  She has been using her inhalers at home without relief of her symptoms.  She complains of coughing, chest pain but denies fever or chills.  She denies a history of CHF.  Patient was given 10 mg of albuterol, IV Solu-Medrol 125, and 2 g of magnesium prior to arrival due to work of breathing.  HPI     Home Medications Prior to Admission medications   Medication Sig Start Date End Date Taking? Authorizing Provider  albuterol (PROVENTIL HFA;VENTOLIN HFA) 108 (90 Base) MCG/ACT inhaler Inhale 2 puffs into the lungs 5 (five) times daily as needed for wheezing or shortness of breath.     [provider]  albuterol (PROVENTIL) (2.5 MG/3ML) 0.083% nebulizer solution Take 3 mLs (2.5 mg total) by nebulization every 6 (six) hours as needed for wheezing or shortness of breath. 01/07/22   Dartha Lodge, PA-C  azithromycin (ZITHROMAX) 250 MG tablet Take 1 tablet (250 mg total) by mouth daily. Take first 2 tablets together, then 1 every day until finished. 01/07/22   Dartha Lodge, PA-C  Buprenorphine HCl-Naloxone HCl 8-2 MG FILM Place 1 Film under the tongue daily. Last Fill 10-14-2021 07/09/21   [provider]  diazepam (VALIUM) 2 MG tablet Take 1 tablet (2 mg total) by mouth every 6 (six) hours as needed for muscle spasms. 04/20/22   Lorre Nick, MD  feeding supplement, GLUCERNA SHAKE, (GLUCERNA SHAKE) LIQD Take 237 mLs by mouth 3  (three) times daily between meals. Patient taking differently: Take 237 mLs by mouth 3 (three) times daily between meals. Chocolate (only) 04/13/18   Roberto Scales D, MD  fluticasone-salmeterol (ADVAIR) 500-50 MCG/ACT AEPB Inhale 1 puff into the lungs in the morning and at bedtime.    [provider]  hydrOXYzine (ATARAX) 25 MG tablet Take 25 mg by mouth 3 (three) times daily as needed for anxiety.    [provider]  ipratropium-albuterol (DUONEB) 0.5-2.5 (3) MG/3ML SOLN Take 3 mLs by nebulization every 4 (four) hours as needed. Patient taking differently: Take 3 mLs by nebulization every 4 (four) hours as needed. Has medication ( no machine) 11/28/21   Calvert Cantor, MD  methocarbamol (ROBAXIN) 500 MG tablet Take 1 tablet (500 mg total) by mouth 2 (two) times daily. 02/13/22   Cardama, Amadeo Garnet, MD      Allergies    Guaifenesin & derivatives, Cymbalta [duloxetine hcl], Ibuprofen, Neurontin [gabapentin], Prozac [fluoxetine hcl], Tylenol [acetaminophen], Ampicillin, Clindamycin/lincomycin, and Penicillins    Review of Systems   Review of Systems  Physical Exam Updated Vital Signs BP (!) 152/93 (BP Location: Right Arm)   Pulse (!) 120   Temp 97.8 F (36.6 C) (Axillary)   Ht 5\' 2"  (1.575 m)   Wt 39.9 kg   SpO2 100%   BMI 16.10 kg/m  Physical Exam Vitals and nursing note reviewed.  Constitutional:      General: She is not in acute distress.    Appearance:  She is well-developed. She is not diaphoretic.  HENT:     Head: Normocephalic and atraumatic.     Right Ear: External ear normal.     Left Ear: External ear normal.     Nose: Nose normal.     Mouth/Throat:     Mouth: Mucous membranes are moist.  Eyes:     General: No scleral icterus.    Conjunctiva/sclera: Conjunctivae normal.  Cardiovascular:     Rate and Rhythm: Normal rate and regular rhythm.     Heart sounds: Normal heart sounds. No murmur heard.    No friction rub. No gallop.  Pulmonary:      Effort: Tachypnea, accessory muscle usage, prolonged expiration and respiratory distress present.     Breath sounds: Decreased air movement present. Examination of the right-upper field reveals decreased breath sounds. Examination of the left-upper field reveals decreased breath sounds. Examination of the right-middle field reveals decreased breath sounds. Examination of the left-middle field reveals decreased breath sounds. Examination of the right-lower field reveals rales. Examination of the left-lower field reveals rales. Decreased breath sounds and rales present.  Abdominal:     General: Bowel sounds are normal. There is no distension.     Palpations: Abdomen is soft. There is no mass.     Tenderness: There is no abdominal tenderness. There is no guarding.  Musculoskeletal:     Cervical back: Normal range of motion.     Right lower leg: No edema.     Left lower leg: No edema.  Skin:    General: Skin is warm and dry.  Neurological:     Mental Status: She is alert and oriented to person, place, and time.  Psychiatric:        Behavior: Behavior normal.     ED Results / Procedures / Treatments   Labs (all labs ordered are listed, but only abnormal results are displayed) Labs Reviewed - No data to display  EKG None  Radiology No results found.  Procedures .Critical Care  Performed by: Arthor Captain, PA-C Authorized by: Arthor Captain, PA-C   Critical care provider statement:    Critical care time (minutes):  30   Critical care time was exclusive of:  Separately billable procedures and treating other patients   Critical care was necessary to treat or prevent imminent or life-threatening deterioration of the following conditions:  Respiratory failure   Critical care was time spent personally by me on the following activities:  Development of treatment plan with patient or surrogate, discussions with consultants, evaluation of patient's response to treatment, examination of  patient, ordering and review of laboratory studies, ordering and review of radiographic studies, ordering and performing treatments and interventions, pulse oximetry, re-evaluation of patient's condition and review of old charts     Medications Ordered in ED Medications - No data to display  ED Course/ Medical Decision Making/ A&P Clinical Course as of 05/25/22 1701  Fri May 24, 2022  0542 Blood gas, venous (at [facility] and [facility], not at [facility])(!) [AH]  0542 pO2, Ven(!): 77 [AH]  0542 pCO2, Ven(!): 68 [AH]  0542 Basic metabolic panel(!) [AH]  0542 Brain natriuretic peptide [AH]  0542 Troponin I (High Sensitivity) [AH]  0542 DG Chest Port 1 View [AH]  0542 EKG 12-Lead [AH]    Clinical Course User Index [AH] Arthor Captain, PA-C                           Medical  Decision Making 56 y/o F who presents with sob.  The emergent differential diagnosis for shortness of breath includes, but is not limited to, Pulmonary edema, bronchoconstriction, Pneumonia, Pulmonary embolism, Pneumotherax/ Hemothorax, Dysrythmia, ACS.  The patient appears to have a potential CAP on plain film.  Although ABG shows no hypercarbia or significant blood gas abnormality the patient has extremely labored breathing and I have ordered BiPAP.  This is likely secondary to advanced COPD.  She does not have a new oxygen requirement however continues to have severe dyspnea given this fact I have ordered a CT angiogram of the chest.  I have given signout to PA Jodi Geralds who will assume care with expectation that the patient will need admission after CT angiogram results.  I have ordered antibiotics and although the patient does have a penicillin allergy it appears she has been able to utilize cephalosporins in the past without cross reaction.  Patient given multiple rounds of albuterol.   Amount and/or Complexity of Data Reviewed Labs: ordered. Decision-making details documented in ED Course.    Details: CBC with mildly elevated  white blood cell count of 10.9, mildly low platelet count of 122 BMP within normal limits troponin within normal limits Radiology: ordered. Decision-making details documented in ED Course.    Details: I ordered, visualized and interpreted chest x-ray  which shows emphysema and right upper lobe pneumonia. ECG/medicine tests: independent interpretation performed. Decision-making details documented in ED Course.    Details: I reviewed and interpreted patient's EKG which shows sinus tachycardia at a rate of 110  Risk Prescription drug management. Decision regarding hospitalization.           Final Clinical Impression(s) / ED Diagnoses Final diagnoses:  None    Rx / DC Orders ED Discharge Orders     None         Arthor Captain, PA-C 05/25/22 1714    Wilkie Aye Mayer Masker, MD 05/26/22 [phone]

## 2022-05-24 NOTE — ED Triage Notes (Signed)
Pt BIB EMS for SOB and COPD exacerbation for the last 8 hour. Per EMS pt was using accessory muscles with wheezing and tightness in the chest. Pt was here a month ago for the same thing. EMS gave 10mg  of albuterol, 0.5 Atrovent, 2mg  Magnsium Sulfate, and 125mg  Solu-medrol  140/80 126 Sinus Tach 100% room air

## 2022-05-24 NOTE — H&P (Signed)
History and Physical    Patient: Michele Lucas CWC:[id number] DOB: [birth year] DOA: 05/24/2022 DOS: the patient was seen and examined on 05/24/2022 PCP: Clare Gandy, MD  Patient coming from: Home  Chief Complaint: SOB.   HPI: Michele Lucas is a 56 y.o. female with medical history significant of anxiety, PTSD, chronic back pain, COPD/emphysema, endometriosis, fibromyalgia, grade 1 diastolic dysfunction, kidney stone, neuropathic pain who was brought to the emergency department via EMS due to acute dyspnea with wheezing since early yesterday evening.  EMS described that she was wheezing, feeling tightness in the chest and using accessory muscles.  She was admitted last month with a similar complaint.  The patient received a continuous 10 mg albuterol neb with 0.5 mg of ipratropium, 2 g of magnesium sulfate and 125 mg of methylprednisolone IVP.  The patient is currently sleeping comfortably while using the BiPAP mask.  She requested something for anxiety due to claustrophobia with the BiPAP mask.  ED course: Initial vital signs were temperature 97.8 F, pulse 120, respirations 22, BP 152/93 mmHg and O2 sat 100%.  The patient was placed on BiPAP ventilation, she received ceftriaxone IVPB, azithromycin IVPB, another continuous DuoNeb and several hours later another 5 mg of albuterol.  She felt very anxious afterwards.  I added lorazepam 0.5 mg IVP.  The patient has been able to rest comfortably afterwards while on BiPAP.  Lab work: Venous blood gas showed a pH of 7.28, PCO2 60 a and PaO2 77 mmHg with a bicarb of 32.0 and acid-base 3.2 mmol/L.  Follow-up arterial blood gas shows improvement of pH at 7.37 with PCO2 of 54 and PO2 of 46 mmHg, bicarbonate was 31.1 and acid-base 4.2 mmol/L.  Troponin was negative.  BNP was 37.7 pg/mL.  BMP was normal except for glucose of 140 mg/dL.  Imaging: Portable 1 view chest radiograph showed pneumonia versus atelectasis in the infrahilar left lower lobe.  CTA chest  showing no PE but positive for emphysema and patchy interstitial thickening, greatest in the right upper lobe being questionable for pneumonia.  Review of Systems: As mentioned in the history of present illness. All other systems reviewed and are negative.  Past Medical History:  Diagnosis Date   Anxiety    Chronic back pain    COPD, mild (HCC)    Emphysema lung (HCC)    Endometriosis    Fibromyalgia    Grade I diastolic dysfunction 11/26/2021   Kidney stones    Neuropathic pain    PTSD (post-traumatic stress disorder)    Past Surgical History:  Procedure Laterality Date   CESAREAN SECTION     copolscopy     TONSILLECTOMY     TUBAL LIGATION     Social History:  reports that she quit smoking about 5 months ago. Her smoking use included cigarettes. She smoked an average of .5 packs per day. She uses smokeless tobacco. She reports that she does not drink alcohol and does not use drugs.  Allergies  Allergen Reactions   Guaifenesin & Derivatives Shortness Of Breath    "Mucinex brand"   Cymbalta [Duloxetine Hcl] Other (See Comments)    Unk reaction   Ibuprofen     Upset stomach   Neurontin [Gabapentin] Other (See Comments)    Reported by [facility] - slowed heart rate per pt   Prozac [Fluoxetine Hcl] Other (See Comments)    Loss of appetite, sedation   Tylenol [Acetaminophen]     Upset stomach   Ampicillin Hives and Rash  Has patient had a PCN reaction causing immediate rash, facial/tongue/throat swelling, SOB or lightheadedness with hypotension: Yes Has patient had a PCN reaction causing severe rash involving mucus membranes or skin necrosis: No Has patient had a PCN reaction that required hospitalization: Unknown Has patient had a PCN reaction occurring within the last 10 years: Unknown If all of the above answers are "NO", then may proceed with Cephalosporin use.   Clindamycin/Lincomycin Swelling and Rash    Have these reactions in feet, ankles and legs.    Penicillins Hives and Rash    Family History  Problem Relation Age of Onset   Diabetes type II Other    Stroke Neg Hx     Prior to Admission medications   Medication Sig Start Date End Date Taking? Authorizing Provider  albuterol (PROVENTIL HFA;VENTOLIN HFA) 108 (90 Base) MCG/ACT inhaler Inhale 2 puffs into the lungs 5 (five) times daily as needed for wheezing or shortness of breath.    Yes [provider]  albuterol (PROVENTIL) (2.5 MG/3ML) 0.083% nebulizer solution Take 3 mLs (2.5 mg total) by nebulization every 6 (six) hours as needed for wheezing or shortness of breath. 01/07/22  Yes Dartha Lodge, PA-C  hydrOXYzine (ATARAX) 25 MG tablet Take 25 mg by mouth 3 (three) times daily as needed for anxiety.   Yes [provider]  azithromycin (ZITHROMAX) 250 MG tablet Take 1 tablet (250 mg total) by mouth daily. Take first 2 tablets together, then 1 every day until finished. Patient not taking: Reported on 05/24/2022 01/07/22   Dartha Lodge, PA-C  diazepam (VALIUM) 2 MG tablet Take 1 tablet (2 mg total) by mouth every 6 (six) hours as needed for muscle spasms. Patient not taking: Reported on 05/24/2022 04/20/22   Lorre Nick, MD  feeding supplement, GLUCERNA SHAKE, (GLUCERNA SHAKE) LIQD Take 237 mLs by mouth 3 (three) times daily between meals. Patient not taking: Reported on 05/24/2022 04/13/18   Roberto Scales D, MD  ipratropium-albuterol (DUONEB) 0.5-2.5 (3) MG/3ML SOLN Take 3 mLs by nebulization every 4 (four) hours as needed. Patient not taking: Reported on 05/24/2022 11/28/21   Calvert Cantor, MD  methocarbamol (ROBAXIN) 500 MG tablet Take 1 tablet (500 mg total) by mouth 2 (two) times daily. Patient not taking: Reported on 05/24/2022 02/13/22   Nira Conn, MD    Physical Exam: Vitals:   05/24/22 0645 05/24/22 0715 05/24/22 0732 05/24/22 0815  BP: 120/81 129/77 129/77 114/71  Pulse: (!) 113 (!) 115 (!) 108 (!) 115  Resp: (!) 22 (!) 24 (!) 29 (!) 21  Temp:       TempSrc:      SpO2: 100% 97% 97% 100%  Weight:      Height:       Physical Exam Vitals and nursing note reviewed.  Constitutional:      General: She is sleeping. She is not in acute distress.    Appearance: She is underweight. She is not diaphoretic.     Interventions: Face mask in place.  HENT:     Head: Normocephalic.     Mouth/Throat:     Mouth: Mucous membranes are moist.  Eyes:     General: No scleral icterus.    Pupils: Pupils are equal, round, and reactive to light.  Neck:     Vascular: No JVD.  Cardiovascular:     Rate and Rhythm: Normal rate and regular rhythm.     Heart sounds: S1 normal and S2 normal.  Pulmonary:  Effort: No accessory muscle usage or retractions.     Breath sounds: Wheezing present. No rhonchi or rales.  Abdominal:     General: Bowel sounds are normal. There is no distension.     Palpations: Abdomen is soft.     Tenderness: There is no abdominal tenderness. There is no right CVA tenderness or left CVA tenderness.  Musculoskeletal:     Cervical back: Neck supple.     Right lower leg: No edema.     Left lower leg: No edema.  Skin:    General: Skin is warm and dry.  Neurological:     General: No focal deficit present.     Mental Status: She is oriented to person, place, and time.  Psychiatric:        Mood and Affect: Mood normal.        Behavior: Behavior normal.    Data Reviewed:  There are no new results to review at this time.  04/07/2018 echocardiogram  -------------------------------------------------------------------  LV EF: 55% -   60%   -------------------------------------------------------------------  Indications:      Dyspnea 786.09.   -------------------------------------------------------------------  History:   PMH:  Acute respiratory failure  Dyspnea.  Chronic  obstructive pulmonary disease.   -------------------------------------------------------------------  Study Conclusions   - Left ventricle: The  cavity size was normal. Wall thickness was    normal. Systolic function was normal. The estimated ejection    fraction was in the range of 55% to 60%. Wall motion was normal;    there were no regional wall motion abnormalities. Doppler    parameters are consistent with abnormal left ventricular    relaxation (grade 1 diastolic dysfunction).  - Aortic valve: There was moderate stenosis. There was moderate    regurgitation.  - Mitral valve: There was mild regurgitation.   Impressions:   - Normal LV systolic function; mild diastolic dysfunction; moderate    AS (mean gradient 26 mmHg) and moderate AI; mild MR.  Assessment and Plan: Principal Problem:   Acute respiratory failure with hypoxia and hypercapnia (HCC) In the setting of:   COPD with acute exacerbation ([place]) Associated with:   CAP (community acquired pneumonia) Observation/PCU. Continue supplemental oxygen. Received methylprednisolone 125 mg IVP x1. This will be followed by prednisone 40 mg p.o. daily in a.m. Scheduled and as needed bronchodilators. Continue ceftriaxone and azithromycin. Check sputum Gram stain, culture and sensitivity. Check strep pneumoniae urinary antigen. Follow-up CBC and chemistry in the morning.   Active Problems:   Generalized anxiety disorder Lorazepam 0.5 mg IV x1.    Thrombocytopenia (HCC) Monitor platelet count.    Underweight Protein levels are normal. Consider nutritional services evaluation.    Grade I diastolic dysfunction No clinical signs of volume overload.    Advance Care Planning:   Code Status: Full Code   Consults:   Family Communication:   Severity of Illness: The appropriate patient status for this patient is OBSERVATION. Observation status is judged to be reasonable and necessary in order to provide the required intensity of service to ensure the patient's safety. The patient's presenting symptoms, physical exam findings, and initial radiographic and laboratory data  in the context of their medical condition is felt to place them at decreased risk for further clinical deterioration. Furthermore, it is anticipated that the patient will be medically stable for discharge from the hospital within 2 midnights of admission.   Author: Reubin Milan, MD 05/24/2022 9:57 AM  For on call review [email or web address].   This document was  prepared using Dragon voice recognition software and may contain some unintended transcription errors.

## 2022-05-24 NOTE — Progress Notes (Signed)
  Echocardiogram 2D Echocardiogram has been performed.  Leta Jungling M 05/24/2022, 2:08 PM

## 2022-05-24 NOTE — ED Notes (Signed)
Patient refusing Lovenox injection at this time. Patient educated about the importance of this medication for preventing blood clots. Patient states she does not want this medication because "it just hurts too much."

## 2022-05-24 NOTE — ED Provider Notes (Signed)
Care assumed from PA [place] at shift change, please see her note for full details, but in brief Michele Lucas is a 56 y.o. female with history of COPD, who presents to the ED for evaluation of worsening shortness of breath.  No baseline oxygen requirement.  Received albuterol, Atrovent, magnesium and Solu-Medrol with EMS prior to arrival.  Lab evaluation has overall been reassuring, chest x-ray concerning for pneumonia versus atelectasis.  Patient placed on BiPAP due to work of breathing, but clinically improving.  Treated with antibiotics and bronchodilators.  CTA of the chest pending and the patient will require admission.   Physical Exam  BP 120/81   Pulse (!) 113   Temp 97.8 F (36.6 C) (Axillary)   Resp (!) 22   Ht 5\' 2"  (1.575 m)   Wt 39.9 kg   SpO2 100%   BMI 16.10 kg/m   Physical Exam Constitutional:      Comments: Patient is alert and responsive.  Cardiovascular:     Rate and Rhythm: Regular rhythm. Tachycardia present.  Pulmonary:     Comments: On BiPAP patient with improved work of breathing, still with some wheezes and rales noted bilaterally.    Procedures  .Critical Care  Performed by: , PA-C Authorized by: Dartha Lodge, PA-C   Critical care provider statement:    Critical care time (minutes):  30   Critical care was necessary to treat or prevent imminent or life-threatening deterioration of the following conditions:  Respiratory failure   Critical care was time spent personally by me on the following activities:  Development of treatment plan with patient or surrogate, discussions with consultants, evaluation of patient's response to treatment, examination of patient, ordering and review of laboratory studies, ordering and review of radiographic studies, ordering and performing treatments and interventions, pulse oximetry, re-evaluation of patient's condition and review of old charts   Care discussed with: admitting provider     ED Course  / MDM   Labs Reviewed  BASIC METABOLIC PANEL - Abnormal; Notable for the following components:      Result Value   Glucose, Bld 140 (*)    All other components within normal limits  BLOOD GAS, VENOUS - Abnormal; Notable for the following components:   pCO2, Ven 68 (*)    pO2, Ven 77 (*)    Bicarbonate 32.0 (*)    Acid-Base Excess 3.2 (*)    All other components within normal limits  BLOOD GAS, ARTERIAL - Abnormal; Notable for the following components:   pCO2 arterial 54 (*)    pO2, Arterial 46 (*)    Bicarbonate 31.1 (*)    Acid-Base Excess 4.2 (*)    All other components within normal limits  BRAIN NATRIURETIC PEPTIDE  TROPONIN I (HIGH SENSITIVITY)  TROPONIN I (HIGH SENSITIVITY)   DG Chest Port 1 View  Result Date: 05/24/2022 CLINICAL DATA:  Shortness of breath and agitation. EXAM: PORTABLE CHEST 1 VIEW COMPARISON:  Portable chest 04/20/2022. FINDINGS: Patient is rotated to the left somewhat limiting the exam. There is a low inspiration compared to the prior study. There is interval increased opacity in the infrahilar left lower lobe concerning for pneumonia or aspiration, alternatively could be simple atelectasis from low lung volumes. No significant pleural collection is seen. The remaining lungs clear with COPD change. The cardiac size is normal. Aortic atherosclerosis and mediastinal configuration are unchanged. No acute osseous findings. IMPRESSION: Pneumonia versus atelectasis in the infrahilar left lower lobe. Follow-up study recommended in full  inspiration. COPD. Electronically Signed   By: Almira Bar M.D.   On: 05/24/2022 05:03    Medical Decision Making Amount and/or Complexity of Data Reviewed Labs: ordered. Decision-making details documented in ED Course. Radiology: ordered. Decision-making details documented in ED Course. ECG/medicine tests:  Decision-making details documented in ED Course.  Risk Prescription drug management. Decision regarding  hospitalization.   Labs overall reassuring.  Initial VBG with PCO2 of 68, PCO2 improving to 54.  Patient placed on BiPAP with improvement in work of breathing.  Chest x-ray with possible pneumonia versus atelectasis in the left lower lobe.  CTA to better characterize and rule out PE with lung infarction.  I have personally viewed and interpreted the CTA, no obvious PE,    I have requested consultation with the hospitalist for admission, and discussed pertinent labs, imaging and plan, Dr. Robb Matar will see and admit the patient      Legrand Rams 05/24/22 2297    Shon Baton, MD 05/25/22 [phone]

## 2022-05-25 ENCOUNTER — Observation Stay (HOSPITAL_COMMUNITY): Payer: Medicare PPO

## 2022-05-25 DIAGNOSIS — R0602 Shortness of breath: Secondary | ICD-10-CM | POA: Diagnosis present

## 2022-05-25 DIAGNOSIS — Z20822 Contact with and (suspected) exposure to covid-19: Secondary | ICD-10-CM | POA: Diagnosis present

## 2022-05-25 DIAGNOSIS — F411 Generalized anxiety disorder: Secondary | ICD-10-CM

## 2022-05-25 DIAGNOSIS — J441 Chronic obstructive pulmonary disease with (acute) exacerbation: Secondary | ICD-10-CM

## 2022-05-25 DIAGNOSIS — J9601 Acute respiratory failure with hypoxia: Secondary | ICD-10-CM | POA: Diagnosis not present

## 2022-05-25 DIAGNOSIS — F4310 Post-traumatic stress disorder, unspecified: Secondary | ICD-10-CM | POA: Diagnosis present

## 2022-05-25 DIAGNOSIS — Z7951 Long term (current) use of inhaled steroids: Secondary | ICD-10-CM | POA: Diagnosis not present

## 2022-05-25 DIAGNOSIS — Z720 Tobacco use: Secondary | ICD-10-CM | POA: Diagnosis not present

## 2022-05-25 DIAGNOSIS — F40240 Claustrophobia: Secondary | ICD-10-CM | POA: Diagnosis present

## 2022-05-25 DIAGNOSIS — Z79899 Other long term (current) drug therapy: Secondary | ICD-10-CM | POA: Diagnosis not present

## 2022-05-25 DIAGNOSIS — R64 Cachexia: Secondary | ICD-10-CM | POA: Diagnosis present

## 2022-05-25 DIAGNOSIS — J189 Pneumonia, unspecified organism: Secondary | ICD-10-CM

## 2022-05-25 DIAGNOSIS — J9621 Acute and chronic respiratory failure with hypoxia: Secondary | ICD-10-CM | POA: Diagnosis present

## 2022-05-25 DIAGNOSIS — J9622 Acute and chronic respiratory failure with hypercapnia: Secondary | ICD-10-CM | POA: Diagnosis present

## 2022-05-25 DIAGNOSIS — J439 Emphysema, unspecified: Secondary | ICD-10-CM | POA: Diagnosis present

## 2022-05-25 DIAGNOSIS — M549 Dorsalgia, unspecified: Secondary | ICD-10-CM | POA: Diagnosis present

## 2022-05-25 DIAGNOSIS — R636 Underweight: Secondary | ICD-10-CM

## 2022-05-25 DIAGNOSIS — D696 Thrombocytopenia, unspecified: Secondary | ICD-10-CM

## 2022-05-25 DIAGNOSIS — G8929 Other chronic pain: Secondary | ICD-10-CM | POA: Diagnosis present

## 2022-05-25 DIAGNOSIS — I5189 Other ill-defined heart diseases: Secondary | ICD-10-CM

## 2022-05-25 DIAGNOSIS — Z886 Allergy status to analgesic agent status: Secondary | ICD-10-CM | POA: Diagnosis not present

## 2022-05-25 DIAGNOSIS — Z881 Allergy status to other antibiotic agents status: Secondary | ICD-10-CM | POA: Diagnosis not present

## 2022-05-25 DIAGNOSIS — Z681 Body mass index (BMI) 19 or less, adult: Secondary | ICD-10-CM | POA: Diagnosis not present

## 2022-05-25 DIAGNOSIS — F064 Anxiety disorder due to known physiological condition: Secondary | ICD-10-CM | POA: Diagnosis present

## 2022-05-25 DIAGNOSIS — E43 Unspecified severe protein-calorie malnutrition: Secondary | ICD-10-CM | POA: Diagnosis present

## 2022-05-25 DIAGNOSIS — Z888 Allergy status to other drugs, medicaments and biological substances status: Secondary | ICD-10-CM | POA: Diagnosis not present

## 2022-05-25 DIAGNOSIS — M797 Fibromyalgia: Secondary | ICD-10-CM | POA: Diagnosis present

## 2022-05-25 DIAGNOSIS — Z880 Allergy status to penicillin: Secondary | ICD-10-CM | POA: Diagnosis not present

## 2022-05-25 LAB — COMPREHENSIVE METABOLIC PANEL
ALT: 12 U/L (ref 0–44)
AST: 17 U/L (ref 15–41)
Albumin: 3.7 g/dL (ref 3.5–5.0)
Alkaline Phosphatase: 50 U/L (ref 38–126)
Anion gap: 5 (ref 5–15)
BUN: 16 mg/dL (ref 6–20)
CO2: 31 mmol/L (ref 22–32)
Calcium: 9.7 mg/dL (ref 8.9–10.3)
Chloride: 105 mmol/L (ref 98–111)
Creatinine, Ser: 0.64 mg/dL (ref 0.44–1.00)
GFR, Estimated: >60 mL/min (ref >60)
Glucose, Bld: 110 mg/dL — ABNORMAL HIGH (ref 70–99)
Potassium: 4.3 mmol/L (ref 3.5–5.1)
Sodium: 141 mmol/L (ref 135–145)
Total Bilirubin: 1.3 mg/dL — ABNORMAL HIGH (ref 0.3–1.2)
Total Protein: 6.7 g/dL (ref 6.5–8.1)

## 2022-05-25 LAB — CBC WITH DIFFERENTIAL/PLATELET
Abs Immature Granulocytes: 0.03 10*3/uL (ref 0.00–0.07)
Basophils Absolute: 0 10*3/uL (ref 0.0–0.1)
Basophils Relative: 0 %
Eosinophils Absolute: 0 10*3/uL (ref 0.0–0.5)
Eosinophils Relative: 0 %
HCT: 41.6 % (ref 36.0–46.0)
Hemoglobin: 12.9 g/dL (ref 12.0–15.0)
Immature Granulocytes: 0 %
Lymphocytes Relative: 12 %
Lymphs Abs: 1.2 10*3/uL (ref 0.7–4.0)
MCH: 31.2 pg (ref 26.0–34.0)
MCHC: 31 g/dL (ref 30.0–36.0)
MCV: 100.7 fL — ABNORMAL HIGH (ref 80.0–100.0)
Monocytes Absolute: 1 10*3/uL (ref 0.1–1.0)
Monocytes Relative: 10 %
Neutro Abs: 7.9 10*3/uL — ABNORMAL HIGH (ref 1.7–7.7)
Neutrophils Relative %: 78 %
Platelets: 139 10*3/uL — ABNORMAL LOW (ref 150–400)
RBC: 4.13 MIL/uL (ref 3.87–5.11)
RDW: 12.1 % (ref 11.5–15.5)
WBC: 10.2 10*3/uL (ref 4.0–10.5)
nRBC: 0 % (ref 0.0–0.2)

## 2022-05-25 LAB — MAGNESIUM: Magnesium: 2.2 mg/dL (ref 1.7–2.4)

## 2022-05-25 LAB — PHOSPHORUS: Phosphorus: 2.3 mg/dL — ABNORMAL LOW (ref 2.5–4.6)

## 2022-05-25 LAB — PROCALCITONIN: Procalcitonin: 0.12 ng/mL

## 2022-05-25 MED ORDER — IPRATROPIUM BROMIDE 0.02 % IN SOLN
0.5000 mg | Freq: Four times a day (QID) | RESPIRATORY_TRACT | Status: DC
  Administered 2022-05-25 – 2022-05-27 (×9): 0.5 mg via RESPIRATORY_TRACT
  Filled 2022-05-25 (×10): qty 2.5

## 2022-05-25 MED ORDER — SODIUM CHLORIDE 3 % IN NEBU
4.0000 mL | INHALATION_SOLUTION | Freq: Every day | RESPIRATORY_TRACT | Status: AC
Start: 2022-05-25 — End: 2022-05-27
  Administered 2022-05-25 – 2022-05-27 (×3): 4 mL via RESPIRATORY_TRACT
  Filled 2022-05-25 (×3): qty 4

## 2022-05-25 MED ORDER — HYDROXYZINE HCL 25 MG PO TABS
25.0000 mg | ORAL_TABLET | Freq: Three times a day (TID) | ORAL | Status: DC | PRN
  Administered 2022-05-25 – 2022-05-27 (×4): 25 mg via ORAL
  Filled 2022-05-25 (×4): qty 1

## 2022-05-25 MED ORDER — SODIUM CHLORIDE 0.9 % IV SOLN
100.0000 mg | Freq: Two times a day (BID) | INTRAVENOUS | Status: DC
  Administered 2022-05-26: 100 mg via INTRAVENOUS
  Filled 2022-05-25: qty 100

## 2022-05-25 MED ORDER — NALOXONE HCL 0.4 MG/ML IJ SOLN: 0.4000 mg | INTRAMUSCULAR | Status: DC | PRN

## 2022-05-25 MED ORDER — OXYCODONE HCL 5 MG/5ML PO SOLN
10.0000 mg | Freq: Four times a day (QID) | ORAL | Status: DC | PRN
  Administered 2022-05-25: 10 mg via ORAL
  Filled 2022-05-25: qty 10

## 2022-05-25 MED ORDER — ACETAMINOPHEN 500 MG PO TABS
1000.0000 mg | ORAL_TABLET | Freq: Four times a day (QID) | ORAL | Status: DC | PRN
Start: 2022-05-25 — End: 2022-05-25

## 2022-05-25 MED ORDER — LEVALBUTEROL HCL 0.63 MG/3ML IN NEBU
0.6300 mg | INHALATION_SOLUTION | Freq: Four times a day (QID) | RESPIRATORY_TRACT | Status: DC
  Administered 2022-05-25 – 2022-05-27 (×9): 0.63 mg via RESPIRATORY_TRACT
  Filled 2022-05-25 (×10): qty 3

## 2022-05-25 MED ORDER — FENTANYL CITRATE PF 50 MCG/ML IJ SOSY
50.0000 ug | PREFILLED_SYRINGE | INTRAMUSCULAR | Status: DC | PRN
  Administered 2022-05-25: 50 ug via INTRAVENOUS
  Filled 2022-05-25: qty 1

## 2022-05-25 MED ORDER — SODIUM CHLORIDE 0.9 % IV SOLN
100.0000 mg | Freq: Two times a day (BID) | INTRAVENOUS | Status: DC
  Filled 2022-05-25: qty 100

## 2022-05-25 MED ORDER — OXYCODONE HCL 5 MG PO TABS: 10.0000 mg | ORAL_TABLET | Freq: Four times a day (QID) | ORAL | Status: DC | PRN

## 2022-05-25 MED ORDER — TRAMADOL HCL 50 MG PO TABS
50.0000 mg | ORAL_TABLET | Freq: Four times a day (QID) | ORAL | Status: DC | PRN
  Administered 2022-05-25 – 2022-05-27 (×6): 50 mg via ORAL
  Filled 2022-05-25 (×6): qty 1

## 2022-05-25 MED ORDER — K PHOS MONO-SOD PHOS DI & MONO 155-852-130 MG PO TABS
500.0000 mg | ORAL_TABLET | Freq: Once | ORAL | Status: AC
  Administered 2022-05-25: 500 mg via ORAL
  Filled 2022-05-25: qty 2

## 2022-05-25 MED ORDER — METHYLPREDNISOLONE SODIUM SUCC 125 MG IJ SOLR
120.0000 mg | INTRAMUSCULAR | Status: DC
  Administered 2022-05-25 – 2022-05-27 (×3): 120 mg via INTRAVENOUS
  Filled 2022-05-25 (×3): qty 2

## 2022-05-25 NOTE — Hospital Course (Addendum)
The patient is a thin cachectic African-American female with a past medical history significant for but not limited to anxiety and depression, PTSD, chronic back pain, history of COPD emphysema, history of endometriosis, fibromyalgia, grade 1 diastolic dysfunction, history of nephrolithiasis and neuropathic pain as well as other comorbidities who was brought to the ED via EMS due to acute dyspnea with wheezing since the evening prior to admission.  EMS described that she is wheezing and having chest tightness and using accessory muscles.  She was admitted last month with a similar complaint and she received 10 mg of continuous albuterol as well as 0.5 mg of ipratropium, 2 mg of mag sulfate and 125 mg of methylprednisolone.  She ended up being placed on the BiPAP and requested something for claustrophobia due to the BiPAP.  Given her presenting symptoms she was placed on IV antibiotics with ceftriaxone and azithromycin however refused to ceftriaxone given her allergies and was given another continuous DuoNeb and several hours later she was given another 5 mg of albuterol.  She felt very anxious afterwards and was given lorazepam.  She had a VBG done and basic blood work.  Portable chest x-ray showed pneumonia versus atelectasis in the infrahilar left lobe and CT of the chest was done and showed no PE but was positive for emphysema and patchy interstitial thickening with the greatest being in the right upper lobe.  She was admitted for acute respiratory failure with hypoxia and hypercarbia in the setting of acute COPD exacerbation associated with likely community-acquired pneumonia and was placed on antibiotics and steroids as well as scheduled and as needed bronchodilators.  Is slowly improving but not back to her baseline.  We will continue steroids for now and PT OT recommending no follow-up.  Nutritionist evaluated given her underweight and they are discontinuing her Ensure and Glucerna shakes and they  are Ordering boost breeze twice daily as well as boost plus 3 times daily and changing her diet to regular diet with nectar thick liquids per SLP recommendations  She had an MBS done which recommended regular diet with thin liquids.  She ambulated and desaturated on room air but was stable on supplemental oxygen.  She improved significantly and was deemed stable for discharge and she will need to follow-up with PCP, pulmonary and rheumatology in outpatient setting and repeated subsequently 6 weeks.

## 2022-05-25 NOTE — Evaluation (Signed)
Clinical/Bedside Swallow Evaluation Patient Details  Name: Michele Lucas MRN: [id number] Date of Birth: 27-Aug-1966  Today's Date: 05/25/2022 Time: SLP Start Time (ACUTE ONLY): 1720 SLP Stop Time (ACUTE ONLY): 1735 SLP Time Calculation (min) (ACUTE ONLY): 15 min  Past Medical History:  Past Medical History:  Diagnosis Date   Anxiety    Chronic back pain    COPD, mild (HCC)    Emphysema lung (HCC)    Endometriosis    Fibromyalgia    Grade I diastolic dysfunction 11/26/2021   Kidney stones    Neuropathic pain    PTSD (post-traumatic stress disorder)    Past Surgical History:  Past Surgical History:  Procedure Laterality Date   CESAREAN SECTION     copolscopy     TONSILLECTOMY     TUBAL LIGATION     HPI:  Patient is a 56 y.o. female with PMH: PTSD, chronic back pain, COPD/emphysema, endometriosis, fibromyalgia, neuropathic pain, kidney stone, who was brought to the ED on 05/24/22 via EMS due to acute dyspnea with wheezing since previous evening. She was admitted to the hospital last month with a similar complaint. She was placed on BiPAP, requested something for anxiety due to BiPAP making her feel claustrophobic. CXR showed PNA versus atelectasis in the infrahilar left lower lobe. CTA chest showing no PE but positive for emphysema and patchy interstitial thickening greatest in RUL being questionable for PNA. Patient c/o difficulty swallowing.    Assessment / Plan / Recommendation  Clinical Impression  Patient presenting with clinical s/s of dysphagia as per this bedside/clinical swallow evaluation. Patient reports that she has had difficulty swallowing liquids for past 2-3 months and although she has coughing from COPD with some being productive, coughing up phlegm "the size of [place]", per her report, coughing with liquids does not seem related. SLP observed patient with PO intake of regular/soft solids, thin liquids and nectar thick liquids. She exhibited immediate coughing with  sips of thin liquids but not with nectar thick liquids, however her baseline cough makes it difficult to fully differentiate. As patient feels she is able to swallow the nectar thick liquids better than thin, SLP and patient in agreement to try changing to nectar thick liquids here in the hospital. SLP will follow for toleration, education and ability to advance. (SLP described MBS and patient has had barium in the past, "it makes you constipated" and in addition with her PTSD and anxiety, suspect the tight fit with camera in radiology suite might not be tolerated well by her. SLP Visit Diagnosis: Dysphagia, unspecified (R13.10)    Aspiration Risk  Mild aspiration risk    Diet Recommendation Regular;Nectar-thick liquid   Liquid Administration via: Cup;Straw Medication Administration: Whole meds with liquid Supervision: Patient able to self feed Compensations: Slow rate;Small sips/bites Postural Changes: Seated upright at 90 degrees    Other  Recommendations Oral Care Recommendations: Oral care BID    Recommendations for follow up therapy are one component of a multi-disciplinary discharge planning process, led by the attending physician.  Recommendations may be updated based on patient status, additional functional criteria and insurance authorization.  Follow up Recommendations Other (comment) (TBD)      Assistance Recommended at Discharge    Functional Status Assessment Patient has had a recent decline in their functional status and demonstrates the ability to make significant improvements in function in a reasonable and predictable amount of time.  Frequency and Duration min 2x/week  1 week       Prognosis Prognosis for  Safe Diet Advancement: Good      Swallow Study   General Date of Onset: 05/24/22 HPI: Patient is a 56 y.o. female with PMH: PTSD, chronic back pain, COPD/emphysema, endometriosis, fibromyalgia, neuropathic pain, kidney stone, who was brought to the ED on 05/24/22  via EMS due to acute dyspnea with wheezing since previous evening. She was admitted to the hospital last month with a similar complaint. She was placed on BiPAP, requested something for anxiety due to BiPAP making her feel claustrophobic. CXR showed PNA versus atelectasis in the infrahilar left lower lobe. CTA chest showing no PE but positive for emphysema and patchy interstitial thickening greatest in RUL being questionable for PNA. Patient c/o difficulty swallowing. Type of Study: Bedside Swallow Evaluation Previous Swallow Assessment: none found Diet Prior to this Study: Regular;Thin liquids Temperature Spikes Noted: No Respiratory Status: Nasal cannula History of Recent Intubation: No Behavior/Cognition: Cooperative;Pleasant mood;Alert Oral Cavity Assessment: Within Functional Limits Oral Care Completed by SLP: No Oral Cavity - Dentition: Missing dentition;Poor condition;Edentulous Vision: Functional for self-feeding Self-Feeding Abilities: Able to feed self Patient Positioning: Upright in bed Baseline Vocal Quality: Normal Volitional Cough: Strong;Congested Volitional Swallow: Able to elicit    Oral/Motor/Sensory Function     Ice Chips     Thin Liquid Thin Liquid: Impaired Presentation: Straw;Self Fed Pharyngeal  Phase Impairments: Throat Clearing - Immediate    Nectar Thick Nectar Thick Liquid: Within functional limits Presentation: Cup;Self Fed   Honey Thick     Puree     Solid     Solid: Within functional limits Presentation: Self Fed      Angela Nevin, MA, CCC-SLP Speech Therapy

## 2022-05-25 NOTE — Progress Notes (Signed)
Per patient request patient ambulated in hallway using front wheel walker and without supplemental oxygen. Oxygen saturations remained consistent between 92-97% on room air and HR between 110s-120s. Patient tolerated well and denied any SOB.

## 2022-05-25 NOTE — Plan of Care (Signed)
  Problem: Activity: Goal: Ability to tolerate increased activity will improve Outcome: Progressing   Problem: Respiratory: Goal: Levels of oxygenation will improve Outcome: Progressing Goal: Ability to maintain adequate ventilation will improve Outcome: Progressing   Problem: Coping: Goal: Level of anxiety will decrease Outcome: Progressing

## 2022-05-25 NOTE — Progress Notes (Signed)
[facility] floor coverage for both [facility] and [facility] (remote) on night of 05/24/22 into morning of 05/25/22:    I was notified by RN that the patient is refusing her Rocephin, reporting a history of allergies to cephalosporin class.  She consents to the azithromycin, which has been ordered in tandem with Rocephin for suspected CAP.  Additionally, the patient requests pain medication in the setting of chronic diffuse arthralgias consistent with her fibromyalgia.  I subsequently placed order for prn IV fentanyl, and will defer additional antibiotic selections to day hospitalist.     Newton Pigg, DO Hospitalist

## 2022-05-25 NOTE — Progress Notes (Signed)
PROGRESS NOTE    Michele Lucas  [id number] DOB: [birth year] DOA: 05/24/2022 PCP: Truman Hayward, MD   Brief Narrative:  The patient is a thin cachectic African-American female with a past medical history significant for but not limited to anxiety and depression, PTSD, chronic back pain, history of COPD emphysema, history of endometriosis, fibromyalgia, grade 1 diastolic dysfunction, history of nephrolithiasis and neuropathic pain as well as other comorbidities who was brought to the ED via EMS due to acute dyspnea with wheezing since the evening prior to admission.  EMS described that she is wheezing and having chest tightness and using accessory muscles.  She was admitted last month with a similar complaint and she received 10 mg of continuous albuterol as well as 0.5 mg of ipratropium, 2 mg of mag sulfate and 125 mg of methylprednisolone.  She ended up being placed on the BiPAP and requested something for claustrophobia due to the BiPAP.  Given her presenting symptoms she was placed on IV antibiotics with ceftriaxone and azithromycin however refused to ceftriaxone given her allergies and was given another continuous DuoNeb and several hours later she was given another 5 mg of albuterol.  She felt very anxious afterwards and was given lorazepam.  She had a VBG done and basic blood work.  Portable chest x-ray showed pneumonia versus atelectasis in the infrahilar left lobe and CT of the chest was done and showed no PE but was positive for emphysema and patchy interstitial thickening with the greatest being in the right upper lobe.  She was admitted for acute respiratory failure with hypoxia and hypercarbia in the setting of acute COPD exacerbation associated with likely community-acquired pneumonia and was placed on antibiotics and steroids as well as scheduled and as needed bronchodilators.   Assessment and Plan:  Acute on chronic respiratory failure with hypoxia and hypercapnia (HCC) In the setting  of:   COPD with acute exacerbation ([place]) Associated with:   CAP (community acquired pneumonia) -Observation/PCU and changed to inpatient -Continue supplemental oxygen.  She has home oxygen however she is never Received methylprednisolone 125 mg IVP x1. -She is to be placed on p.o. prednisone however we have changed this to IV Solu-Medrol -Scheduled and as needed bronchodilators.  Will adjust nebs and added Brovana Continue ceftriaxone and azithromycin but she refuses ceftriaxone and will discontinue azithromycin and changed to doxycycline -Check sputum Gram stain, culture and sensitivity. Check strep pneumoniae urinary antigen. -Respiratory virus panel and COVID testing was negative -SpO2: 98 % O2 Flow Rate (L/min): 3 L/min FiO2 (%): 30 % -Patient will need an Ambulatory home O2 screen prior to discharge and repeat chest x-ray in a.m.   Generalized anxiety disorder -Lorazepam 0.5 mg IV x1. -Resume home hydroxyzine   Thrombocytopenia (HCC) -Monitor platelet count.  And platelet count went from 122 is now 139   Underweight -Protein levels are normal. -Have consulted nutrition   Grade I Diastolic Dysfunction -No clinical signs of volume overload. -Echo shows grade 1 diastolic dysfunction still  Hypophosphatemia -Patient's Phos Level was 2.3 -Replete with po K Phos Neutral 500 mg x1 -Continue to Monitor and Replete as Necessary -Repeat Phos Level in the AM   Hyperbilirubinemia -T Bili went from 1.5 -> 1.3 -Continue to Monitor and Trend  DVT prophylaxis: enoxaparin (LOVENOX) injection 30 mg Start: 05/24/22 1200    Code Status: Full Code Family Communication: Spoke with daughter at bedside  Disposition Plan:  Level of care: Progressive Status is: Observation The patient will require care spanning > 2  midnights and should be moved to inpatient because: She continues to be dyspneic and still fatigued and has some pain   Consultants:  None  Procedures:   Echocardiogram IMPRESSIONS     1. Old study not available to review.   2. Left ventricular ejection fraction, by estimation, is 60 to 65%. The  left ventricle has normal function. The left ventricle has no regional  wall motion abnormalities. Left ventricular diastolic parameters are  consistent with Grade I diastolic  dysfunction (impaired relaxation).   3. Right ventricular systolic function is normal. The right ventricular  size is normal.   4. The mitral valve is abnormal. Trivial mitral valve regurgitation. No  evidence of mitral stenosis.   5. AV not well seen due to respiratory /lung interference Calcified and  on PSL images bows like it may be bicuspid . The aortic valve was not well  visualized. There is severe calcifcation of the aortic valve. There is  severe thickening of the aortic  valve. Aortic valve regurgitation is moderate. Moderate aortic valve  stenosis.   6. The inferior vena cava is normal in size with greater than 50%  respiratory variability, suggesting right atrial pressure of 3 mmHg.   FINDINGS   Left Ventricle: Left ventricular ejection fraction, by estimation, is 60  to 65%. The left ventricle has normal function. The left ventricle has no  regional wall motion abnormalities. The left ventricular internal cavity  size was normal in size. There is   no left ventricular hypertrophy. Left ventricular diastolic parameters  are consistent with Grade I diastolic dysfunction (impaired relaxation).   Right Ventricle: The right ventricular size is normal. No increase in  right ventricular wall thickness. Right ventricular systolic function is  normal.   Left Atrium: Left atrial size was normal in size.   Right Atrium: Right atrial size was normal in size.   Pericardium: There is no evidence of pericardial effusion.   Mitral Valve: The mitral valve is abnormal. There is mild thickening of  the mitral valve leaflet(s). There is mild calcification of the  mitral  valve leaflet(s). Trivial mitral valve regurgitation. No evidence of  mitral valve stenosis.   Tricuspid Valve: The tricuspid valve is normal in structure. Tricuspid  valve regurgitation is not demonstrated. No evidence of tricuspid  stenosis.   Aortic Valve: AV not well seen due to respiratory /lung interference  Calcified and on PSL images bows like it may be bicuspid. The aortic valve  was not well visualized. There is severe calcifcation of the aortic valve.  There is severe thickening of the  aortic valve. Aortic valve regurgitation is moderate. Aortic regurgitation  PHT measures 472 msec. Moderate aortic stenosis is present. Aortic valve  mean gradient measures 18.0 mmHg. Aortic valve peak gradient measures 29.1  mmHg. Aortic valve area, by VTI   measures 0.73 cm.   Pulmonic Valve: The pulmonic valve was normal in structure. Pulmonic valve  regurgitation is not visualized. No evidence of pulmonic stenosis.   Aorta: The aortic root is normal in size and structure.   Venous: The inferior vena cava is normal in size with greater than 50%  respiratory variability, suggesting right atrial pressure of 3 mmHg.   IAS/Shunts: No atrial level shunt detected by color flow Doppler.   Additional Comments: Old study not available to review.      LEFT VENTRICLE  PLAX 2D  LVIDd:         4.00 cm  LVIDs:  2.60 cm  LV PW:         0.70 cm  LV IVS:        0.60 cm  LVOT diam:     1.80 cm  LV SV:         34  LV SV Index:   25  LVOT Area:     2.54 cm      RIGHT VENTRICLE  RV S prime:     14.50 cm/s  TAPSE (M-mode): 1.8 cm   LEFT ATRIUM             Index        RIGHT ATRIUM          Index  LA diam:        2.40 cm 1.78 cm/m   RA Area:     8.00 cm  LA Vol (A2C):   16.4 ml 12.16 ml/m  RA Volume:   15.70 ml 11.64 ml/m  LA Vol (A4C):   19.8 ml 14.68 ml/m  LA Biplane Vol: 19.1 ml 14.16 ml/m   AORTIC VALVE  AV Area (Vmax):    1.04 cm  AV Area (Vmean):   0.82 cm   AV Area (VTI):     0.73 cm  AV Vmax:           269.50 cm/s  AV Vmean:          200.000 cm/s  AV VTI:            0.458 m  AV Peak Grad:      29.1 mmHg  AV Mean Grad:      18.0 mmHg  LVOT Vmax:         110.00 cm/s  LVOT Vmean:        64.600 cm/s  LVOT VTI:          0.132 m  LVOT/AV VTI ratio: 0.29  AI PHT:            472 msec     AORTA  Ao Root diam: 2.70 cm      SHUNTS  Systemic VTI:  0.13 m  Systemic Diam: 1.80 cm   Antimicrobials:  Anti-infectives (From admission, onward)    Start     Dose/Rate Route Frequency Ordered Stop   05/26/22 0600  doxycycline (VIBRAMYCIN) 100 mg in sodium chloride 0.9 % 250 mL IVPB        100 mg 125 mL/hr over 120 Minutes Intravenous Every 12 hours 05/25/22 1005     05/25/22 1000  doxycycline (VIBRAMYCIN) 100 mg in sodium chloride 0.9 % 250 mL IVPB  Status:  Discontinued        100 mg 125 mL/hr over 120 Minutes Intravenous Every 12 hours 05/25/22 0904 05/25/22 1005   05/25/22 0600  cefTRIAXone (ROCEPHIN) 2 g in sodium chloride 0.9 % 100 mL IVPB  Status:  Discontinued        2 g 200 mL/hr over 30 Minutes Intravenous Every 24 hours 05/24/22 0852 05/25/22 0904   05/25/22 0600  azithromycin (ZITHROMAX) 500 mg in sodium chloride 0.9 % 250 mL IVPB  Status:  Discontinued        500 mg 250 mL/hr over 60 Minutes Intravenous Every 24 hours 05/24/22 0852 05/25/22 1002   05/24/22 0545  cefTRIAXone (ROCEPHIN) 1 g in sodium chloride 0.9 % 100 mL IVPB        1 g 200 mL/hr over 30 Minutes Intravenous  Once 05/24/22 0537 05/24/22 0714   05/24/22  0545  azithromycin (ZITHROMAX) 500 mg in sodium chloride 0.9 % 250 mL IVPB        500 mg 250 mL/hr over 60 Minutes Intravenous  Once 05/24/22 0537 05/24/22 [id number]        Subjective: Seen and examined at bedside and she is still feeling short of breath.  States that she had some pain as well.  No nausea or vomiting.  Felt okay.  No other concerns or complaints at this time.  Objective: Vitals:   05/25/22 0742  05/25/22 0845 05/25/22 1200 05/25/22 1259  BP: 115/74   132/84  Pulse: (!) 125   (!) 114  Resp: (!) 22     Temp: 98.4 F (36.9 C)   98 F (36.7 C)  TempSrc: Oral   Oral  SpO2: 100% 96% 96% 98%  Weight:      Height:        Intake/Output Summary (Last 24 hours) at 05/25/2022 1747 Last data filed at 05/25/2022 1036 Gross per 24 hour  Intake 331.84 ml  Output --  Net 331.84 ml   Filed Weights   05/24/22 0334 05/25/22 0320  Weight: 39.9 kg 37.7 kg   Examination: Physical Exam:  Constitutional: Thin cachectic chronically ill-appearing African-American female who appears to be in no acute distress but is very fatigued appearing Respiratory: Diminished to auscultation bilaterally with coarse breath sounds and has some wheezing and some slight rhonchi.,  No appreciable crackles or rales.. Normal respiratory effort and patient is not tachypenic. No accessory muscle use.  Unlabored breathing and wearing supplemental oxygen nasal cannula Cardiovascular: RRR, no murmurs / rubs / gallops. S1 and S2 auscultated.  No extremity edema.  Abdomen: Soft, non-tender, non-distended. Bowel sounds positive.  GU: Deferred. Musculoskeletal: No clubbing / cyanosis of digits/nails. No joint deformity upper and lower extremities. Neurologic: CN 2-12 grossly intact with no focal deficits. Romberg sign and cerebellar reflexes not assessed.  Psychiatric: Normal judgment and insight.  She is slightly somnolent but easily arousable  Data Reviewed: I have personally reviewed following labs and imaging studies  CBC: Recent Labs  Lab 05/24/22 0750 05/25/22 0512  WBC 10.9* 10.2  NEUTROABS 10.5* 7.9*  HGB 12.2 12.9  HCT 39.5 41.6  MCV 101.0* 100.7*  PLT 122* [id number]*   Basic Metabolic Panel: Recent Labs  Lab 05/24/22 0409 05/24/22 0606 05/25/22 0819  NA 138  --  141  K 4.2  --  4.3  CL 100  --  105  CO2 30  --  31  GLUCOSE 140*  --  110*  BUN 10  --  16  CREATININE 0.49  --  0.64  CALCIUM 9.0  --   9.7  MG  --   --  2.2  PHOS  --  2.8 2.3*   GFR: Estimated Creatinine Clearance: 46.2 mL/min (by C-G formula based on SCr of 0.64 mg/dL). Liver Function Tests: Recent Labs  Lab 05/24/22 0606 05/25/22 0819  AST 18 17  ALT 13 12  ALKPHOS 56 50  BILITOT 1.5* 1.3*  PROT 7.4 6.7  ALBUMIN 4.2 3.7   No results for input(s): "LIPASE", "AMYLASE" in the last 168 hours. No results for input(s): "AMMONIA" in the last 168 hours. Coagulation Profile: No results for input(s): "INR", "PROTIME" in the last 168 hours. Cardiac Enzymes: No results for input(s): "CKTOTAL", "CKMB", "CKMBINDEX", "TROPONINI" in the last 168 hours. BNP (last 3 results) No results for input(s): "PROBNP" in the last 8760 hours. HbA1C: No results for input(s): "HGBA1C" in  the last 72 hours. CBG: No results for input(s): "GLUCAP" in the last 168 hours. Lipid Profile: No results for input(s): "CHOL", "HDL", "LDLCALC", "TRIG", "CHOLHDL", "LDLDIRECT" in the last 72 hours. Thyroid Function Tests: No results for input(s): "TSH", "T4TOTAL", "FREET4", "T3FREE", "THYROIDAB" in the last 72 hours. Anemia Panel: No results for input(s): "VITAMINB12", "FOLATE", "FERRITIN", "TIBC", "IRON", "RETICCTPCT" in the last 72 hours. Sepsis Labs: Recent Labs  Lab 05/24/22 0606 05/25/22 0512  PROCALCITON <0.10 0.12    Recent Results (from the past 240 hour(s))  SARS Coronavirus 2 by RT PCR (hospital order, performed in [facility] hospital lab) *cepheid single result test* Anterior Nasal Swab     Status: None   Collection Time: 05/24/22  9:42 AM   Specimen: Anterior Nasal Swab  Result Value Ref Range Status   SARS Coronavirus 2 by RT PCR NEGATIVE NEGATIVE Final    Comment: (NOTE) SARS-CoV-2 target nucleic acids are NOT DETECTED.  The SARS-CoV-2 RNA is generally detectable in upper and lower respiratory specimens during the acute phase of infection. The lowest concentration of SARS-CoV-2 viral copies this assay can detect is  250 copies / mL. A negative result does not preclude SARS-CoV-2 infection and should not be used as the sole basis for treatment or other patient management decisions.  A negative result may occur with improper specimen collection / handling, submission of specimen other than nasopharyngeal swab, presence of viral mutation(s) within the areas targeted by this assay, and inadequate number of viral copies (<250 copies / mL). A negative result must be combined with clinical observations, patient history, and epidemiological information.  Fact Sheet for Patients:   RoadLapTop.co.za  Fact Sheet for Healthcare Providers: http://kim-miller.com/  This test is not yet approved or  cleared by the [place] FDA and has been authorized for detection and/or diagnosis of SARS-CoV-2 by FDA under an Emergency Use Authorization (EUA).  This EUA will remain in effect (meaning this test can be used) for the duration of the COVID-19 declaration under Section 564(b)(1) of the Act, 21 U.S.C. section 360bbb-3(b)(1), unless the authorization is terminated or revoked sooner.  Performed at [facility], [address], [place], [place] [address]   Respiratory (~20 pathogens) panel by PCR     Status: None   Collection Time: 05/24/22 11:10 AM   Specimen: Anterior Nasal Swab; Respiratory  Result Value Ref Range Status   Adenovirus NOT DETECTED NOT DETECTED Final   Coronavirus 229E NOT DETECTED NOT DETECTED Final    Comment: (NOTE) The Coronavirus on the Respiratory Panel, DOES NOT test for the novel  Coronavirus (2019 nCoV)    Coronavirus HKU1 NOT DETECTED NOT DETECTED Final   Coronavirus NL63 NOT DETECTED NOT DETECTED Final   Coronavirus OC43 NOT DETECTED NOT DETECTED Final   Metapneumovirus NOT DETECTED NOT DETECTED Final   Rhinovirus / Enterovirus NOT DETECTED NOT DETECTED Final   Influenza A NOT DETECTED NOT DETECTED Final    Influenza B NOT DETECTED NOT DETECTED Final   Parainfluenza Virus 1 NOT DETECTED NOT DETECTED Final   Parainfluenza Virus 2 NOT DETECTED NOT DETECTED Final   Parainfluenza Virus 3 NOT DETECTED NOT DETECTED Final   Parainfluenza Virus 4 NOT DETECTED NOT DETECTED Final   Respiratory Syncytial Virus NOT DETECTED NOT DETECTED Final   Bordetella pertussis NOT DETECTED NOT DETECTED Final   Bordetella Parapertussis NOT DETECTED NOT DETECTED Final   Chlamydophila pneumoniae NOT DETECTED NOT DETECTED Final   Mycoplasma pneumoniae NOT DETECTED NOT DETECTED Final  Comment: Performed at [facility], [place] [address], [place], [place] [address]     Radiology Studies: DG CHEST PORT 1 VIEW  Result Date: 05/25/2022 CLINICAL DATA:  Cough today with concern for pneumonia. EXAM: PORTABLE CHEST 1 VIEW COMPARISON:  05/24/2022 FINDINGS: Lungs are adequately inflated without focal airspace consolidation or effusion. Cardiomediastinal silhouette and remainder of the exam is unchanged. IMPRESSION: No active disease. Electronically Signed   By: Marin Olp M.D.   On: 05/25/2022 09:06   ECHOCARDIOGRAM COMPLETE  Result Date: 05/24/2022    ECHOCARDIOGRAM REPORT   Patient Name:   Michele Lucas Date of Exam: 05/24/2022 Medical Rec #:  [id number]     Height:       62.0 in Accession #:    [id number]    Weight:       88.0 lb Date of Birth:  08/10/1966      BSA:          1.349 m Patient Age:    56 years      BP:           130/19 mmHg Patient Gender: F             HR:           119 bpm. Exam Location:  Inpatient Procedure: 2D Echo, Cardiac Doppler and Color Doppler Indications:    Dyspnea R06.00  History:        Patient has prior history of Echocardiogram examinations, most                 recent 04/07/2018. COPD, Arrythmias:Tachycardia; Risk                 Factors:Former Smoker. Acute respiratory failure with hypoxia                 and hypercapnia.  Sonographer:    Darlina Sicilian RDCS Referring Phys: [id number] [place]  Sonographer Comments: Image acquisition challenging due to respiratory motion and BiPaP. IMPRESSIONS  1. Old study not available to review.  2. Left ventricular ejection fraction, by estimation, is 60 to 65%. The left ventricle has normal function. The left ventricle has no regional wall motion abnormalities. Left ventricular diastolic parameters are consistent with Grade I diastolic dysfunction (impaired relaxation).  3. Right ventricular systolic function is normal. The right ventricular size is normal.  4. The mitral valve is abnormal. Trivial mitral valve regurgitation. No evidence of mitral stenosis.  5. AV not well seen due to respiratory /lung interference Calcified and on PSL images bows like it may be bicuspid . The aortic valve was not well visualized. There is severe calcifcation of the aortic valve. There is severe thickening of the aortic valve. Aortic valve regurgitation is moderate. Moderate aortic valve stenosis.  6. The inferior vena cava is normal in size with greater than 50% respiratory variability, suggesting right atrial pressure of 3 mmHg. FINDINGS  Left Ventricle: Left ventricular ejection fraction, by estimation, is 60 to 65%. The left ventricle has normal function. The left ventricle has no regional wall motion abnormalities. The left ventricular internal cavity size was normal in size. There is  no left ventricular hypertrophy. Left ventricular diastolic parameters are consistent with Grade I diastolic dysfunction (impaired relaxation). Right Ventricle: The right ventricular size is normal. No increase in right ventricular wall thickness. Right ventricular systolic function is normal. Left Atrium: Left atrial size was normal in size. Right Atrium: Right atrial size was normal in size. Pericardium:  There is no evidence of pericardial effusion. Mitral Valve: The mitral valve is abnormal. There is mild thickening of the mitral valve leaflet(s). There is mild calcification of the mitral  valve leaflet(s). Trivial mitral valve regurgitation. No evidence of mitral valve stenosis. Tricuspid Valve: The tricuspid valve is normal in structure. Tricuspid valve regurgitation is not demonstrated. No evidence of tricuspid stenosis. Aortic Valve: AV not well seen due to respiratory /lung interference Calcified and on PSL images bows like it may be bicuspid. The aortic valve was not well visualized. There is severe calcifcation of the aortic valve. There is severe thickening of the aortic valve. Aortic valve regurgitation is moderate. Aortic regurgitation PHT measures 472 msec. Moderate aortic stenosis is present. Aortic valve mean gradient measures 18.0 mmHg. Aortic valve peak gradient measures 29.1 mmHg. Aortic valve area, by VTI  measures 0.73 cm. Pulmonic Valve: The pulmonic valve was normal in structure. Pulmonic valve regurgitation is not visualized. No evidence of pulmonic stenosis. Aorta: The aortic root is normal in size and structure. Venous: The inferior vena cava is normal in size with greater than 50% respiratory variability, suggesting right atrial pressure of 3 mmHg. IAS/Shunts: No atrial level shunt detected by color flow Doppler. Additional Comments: Old study not available to review.  LEFT VENTRICLE PLAX 2D LVIDd:         4.00 cm LVIDs:         2.60 cm LV PW:         0.70 cm LV IVS:        0.60 cm LVOT diam:     1.80 cm LV SV:         34 LV SV Index:   25 LVOT Area:     2.54 cm  RIGHT VENTRICLE RV S prime:     14.50 cm/s TAPSE (M-mode): 1.8 cm LEFT ATRIUM             Index        RIGHT ATRIUM          Index LA diam:        2.40 cm 1.78 cm/m   RA Area:     8.00 cm LA Vol (A2C):   16.4 ml 12.16 ml/m  RA Volume:   15.70 ml 11.64 ml/m LA Vol (A4C):   19.8 ml 14.68 ml/m LA Biplane Vol: 19.1 ml 14.16 ml/m  AORTIC VALVE AV Area (Vmax):    1.04 cm AV Area (Vmean):   0.82 cm AV Area (VTI):     0.73 cm AV Vmax:           269.50 cm/s AV Vmean:          200.000 cm/s AV VTI:            0.458 m  AV Peak Grad:      29.1 mmHg AV Mean Grad:      18.0 mmHg LVOT Vmax:         110.00 cm/s LVOT Vmean:        64.600 cm/s LVOT VTI:          0.132 m LVOT/AV VTI ratio: 0.29 AI PHT:            472 msec  AORTA Ao Root diam: 2.70 cm  SHUNTS Systemic VTI:  0.13 m Systemic Diam: 1.80 cm Charlton Haws MD Electronically signed by Charlton Haws MD Signature Date/Time: 05/24/2022/2:22:53 PM    Final    CT Angio Chest PE W and/or Wo Contrast  Result  Date: 05/24/2022 CLINICAL DATA:  Chest pain or SOB, pleurisy or effusion suspected EXAM: CT ANGIOGRAPHY CHEST WITH CONTRAST TECHNIQUE: Multidetector CT imaging of the chest was performed using the standard protocol during bolus administration of intravenous contrast. Multiplanar CT image reconstructions and MIPs were obtained to evaluate the vascular anatomy. RADIATION DOSE REDUCTION: This exam was performed according to the departmental dose-optimization program which includes automated exposure control, adjustment of the mA and/or kV according to patient size and/or use of iterative reconstruction technique. CONTRAST:  15mL OMNIPAQUE IOHEXOL 350 MG/ML SOLN COMPARISON:  None Available. FINDINGS: Cardiovascular: Satisfactory opacification of the pulmonary arteries to the segmental level. No evidence of pulmonary embolism. Normal heart size. No pericardial effusion. Mediastinum/Nodes: No enlarged nodes. Esophagus is unremarkable. Thyroid is poorly evaluated due to streak artifact. Lungs/Pleura: Emphysema. Patchy interstitial thickening, greatest in the right upper lobe. No pleural effusion or pneumothorax. Upper Abdomen: No acute abnormality. Mixed plaque along the abdominal aorta. Musculoskeletal: No acute osseous abnormality. Review of the MIP images confirms the above findings. IMPRESSION: No evidence of acute pulmonary embolism. Emphysema. Patchy interstitial thickening, greatest in the right upper lobe. This may be chronic or reflect mild infectious/inflammatory changes.  Electronically Signed   By: Macy Mis M.D.   On: 05/24/2022 08:27   DG Chest Port 1 View  Result Date: 05/24/2022 CLINICAL DATA:  Shortness of breath and agitation. EXAM: PORTABLE CHEST 1 VIEW COMPARISON:  Portable chest 04/20/2022. FINDINGS: Patient is rotated to the left somewhat limiting the exam. There is a low inspiration compared to the prior study. There is interval increased opacity in the infrahilar left lower lobe concerning for pneumonia or aspiration, alternatively could be simple atelectasis from low lung volumes. No significant pleural collection is seen. The remaining lungs clear with COPD change. The cardiac size is normal. Aortic atherosclerosis and mediastinal configuration are unchanged. No acute osseous findings. IMPRESSION: Pneumonia versus atelectasis in the infrahilar left lower lobe. Follow-up study recommended in full inspiration. COPD. Electronically Signed   By: Telford Nab M.D.   On: 05/24/2022 05:03     Scheduled Meds:  enoxaparin (LOVENOX) injection  30 mg Subcutaneous Daily   feeding supplement  237 mL Oral BID BM   feeding supplement (GLUCERNA SHAKE)  237 mL Oral TID BM   ipratropium  0.5 mg Nebulization Q6H   levalbuterol  0.63 mg Nebulization Q6H   methylPREDNISolone (SOLU-MEDROL) injection  120 mg Intravenous Q24H   sodium chloride HYPERTONIC  4 mL Nebulization Daily   Continuous Infusions:  [START ON 05/26/2022] doxycycline (VIBRAMYCIN) IV      LOS: 0 days   Kerney Elbe, DO [facility] Available via Epic secure chat 7am-7pm After these hours, please refer to coverage provider listed on [email or web address] 05/25/2022, 5:47 PM

## 2022-05-26 ENCOUNTER — Inpatient Hospital Stay (HOSPITAL_COMMUNITY): Payer: Medicare PPO

## 2022-05-26 DIAGNOSIS — J9601 Acute respiratory failure with hypoxia: Secondary | ICD-10-CM | POA: Diagnosis not present

## 2022-05-26 DIAGNOSIS — J441 Chronic obstructive pulmonary disease with (acute) exacerbation: Secondary | ICD-10-CM | POA: Diagnosis not present

## 2022-05-26 DIAGNOSIS — I5189 Other ill-defined heart diseases: Secondary | ICD-10-CM | POA: Diagnosis not present

## 2022-05-26 DIAGNOSIS — F411 Generalized anxiety disorder: Secondary | ICD-10-CM | POA: Diagnosis not present

## 2022-05-26 LAB — COMPREHENSIVE METABOLIC PANEL
ALT: 11 U/L (ref 0–44)
AST: 13 U/L — ABNORMAL LOW (ref 15–41)
Albumin: 3.4 g/dL — ABNORMAL LOW (ref 3.5–5.0)
Alkaline Phosphatase: 46 U/L (ref 38–126)
Anion gap: 5 (ref 5–15)
BUN: 16 mg/dL (ref 6–20)
CO2: 30 mmol/L (ref 22–32)
Calcium: 9.6 mg/dL (ref 8.9–10.3)
Chloride: 106 mmol/L (ref 98–111)
Creatinine, Ser: 0.56 mg/dL (ref 0.44–1.00)
GFR, Estimated: >60 mL/min (ref >60)
Glucose, Bld: 80 mg/dL (ref 70–99)
Potassium: 4.6 mmol/L (ref 3.5–5.1)
Sodium: 141 mmol/L (ref 135–145)
Total Bilirubin: 0.9 mg/dL (ref 0.3–1.2)
Total Protein: 6.3 g/dL — ABNORMAL LOW (ref 6.5–8.1)

## 2022-05-26 LAB — CBC WITH DIFFERENTIAL/PLATELET
Abs Immature Granulocytes: 0.02 10*3/uL (ref 0.00–0.07)
Basophils Absolute: 0 10*3/uL (ref 0.0–0.1)
Basophils Relative: 0 %
Eosinophils Absolute: 0 10*3/uL (ref 0.0–0.5)
Eosinophils Relative: 0 %
HCT: 39.2 % (ref 36.0–46.0)
Hemoglobin: 12.5 g/dL (ref 12.0–15.0)
Immature Granulocytes: 0 %
Lymphocytes Relative: 15 %
Lymphs Abs: 1.4 10*3/uL (ref 0.7–4.0)
MCH: 31.9 pg (ref 26.0–34.0)
MCHC: 31.9 g/dL (ref 30.0–36.0)
MCV: 100 fL (ref 80.0–100.0)
Monocytes Absolute: 1.1 10*3/uL — ABNORMAL HIGH (ref 0.1–1.0)
Monocytes Relative: 12 %
Neutro Abs: 6.3 10*3/uL (ref 1.7–7.7)
Neutrophils Relative %: 73 %
Platelets: 138 10*3/uL — ABNORMAL LOW (ref 150–400)
RBC: 3.92 MIL/uL (ref 3.87–5.11)
RDW: 12 % (ref 11.5–15.5)
WBC: 8.8 10*3/uL (ref 4.0–10.5)
nRBC: 0 % (ref 0.0–0.2)

## 2022-05-26 LAB — PHOSPHORUS: Phosphorus: 3.1 mg/dL (ref 2.5–4.6)

## 2022-05-26 LAB — PROCALCITONIN: Procalcitonin: <0.1 ng/mL

## 2022-05-26 LAB — MAGNESIUM: Magnesium: 2.3 mg/dL (ref 1.7–2.4)

## 2022-05-26 MED ORDER — FOOD THICKENER (SIMPLYTHICK): 1.0000 | ORAL | Status: DC | PRN

## 2022-05-26 MED ORDER — BENZONATATE 100 MG PO CAPS
200.0000 mg | ORAL_CAPSULE | Freq: Three times a day (TID) | ORAL | Status: DC | PRN
  Administered 2022-05-26 – 2022-05-27 (×3): 200 mg via ORAL
  Filled 2022-05-26 (×3): qty 2

## 2022-05-26 MED ORDER — BOOST PLUS PO LIQD
237.0000 mL | Freq: Three times a day (TID) | ORAL | Status: DC
  Administered 2022-05-26 – 2022-05-27 (×2): 237 mL via ORAL
  Filled 2022-05-26 (×5): qty 237

## 2022-05-26 MED ORDER — BOOST / RESOURCE BREEZE PO LIQD CUSTOM
1.0000 | Freq: Two times a day (BID) | ORAL | Status: DC
  Administered 2022-05-26 – 2022-05-27 (×3): 1 via ORAL

## 2022-05-26 MED ORDER — ADULT MULTIVITAMIN W/MINERALS CH
1.0000 | ORAL_TABLET | Freq: Every day | ORAL | Status: DC
  Administered 2022-05-27: 1 via ORAL
  Filled 2022-05-26 (×2): qty 1

## 2022-05-26 MED ORDER — DOXYCYCLINE HYCLATE 100 MG PO TABS
100.0000 mg | ORAL_TABLET | Freq: Two times a day (BID) | ORAL | Status: DC
  Administered 2022-05-26 – 2022-05-27 (×2): 100 mg via ORAL
  Filled 2022-05-26 (×2): qty 1

## 2022-05-26 NOTE — Progress Notes (Signed)
PT Cancellation Note  Patient Details Name: Michele Lucas MRN: [id number] DOB: [birth year]   Cancelled Treatment:     PT order received but eval deferred.  RN advises pt up in halls sans O2 and with no PT needs at this time.  PT will sign off    Aideliz Garmany 05/26/2022, 8:26 AM

## 2022-05-26 NOTE — Progress Notes (Signed)
OT Cancellation Note  Patient Details Name: Michele Lucas MRN: [id number] DOB: 1966/02/26   Cancelled Treatment:    Reason Eval/Treat Not Completed: OT screened, no needs identified, will sign off. Patient independent in room and hall. OT evaluation not indicated at this time.  Dontell Mian L Courney Garrod 05/26/2022, 8:30 AM

## 2022-05-26 NOTE — Plan of Care (Signed)
  Problem: Education: Goal: Knowledge of disease or condition will improve Outcome: Progressing   Problem: Activity: Goal: Ability to tolerate increased activity will improve Outcome: Progressing Goal: Will verbalize the importance of balancing activity with adequate rest periods Outcome: Progressing   Problem: Respiratory: Goal: Ability to maintain a clear airway will improve Outcome: Progressing

## 2022-05-26 NOTE — Progress Notes (Signed)
Initial Nutrition Assessment RD working remotely.  DOCUMENTATION CODES:   Underweight  INTERVENTION:  - will d/c Ensure. - will d/c Glucerna Shake. - will order Boost Breeze BID, each supplement provides 250 kcal and 9 grams of protein. - will order Boost Plus TID, each supplement provides 360 kcal and 14 grams of protein. - will order 1 tablet multivitamin with minerals/day. - will change diet order to Regular, nectar-thick liquids per SLP recommendation. - will order Simply Thick PRN. - complete NFPE when feasible.    NUTRITION DIAGNOSIS:   Increased nutrient needs related to acute illness as evidenced by estimated needs.  GOAL:   Patient will meet greater than or equal to 90% of their needs  MONITOR:   PO intake, Supplement acceptance, Labs, Weight trends  REASON FOR ASSESSMENT:   Malnutrition Screening Tool, Consult Assessment of nutrition requirement/status  ASSESSMENT:   56 y.o. female with medical history of anxiety, PTSD, chronic back pain, COPD, emphysema, endometriosis, fibromyalgia, grade 1 diastolic dysfunction, kidney stone, and neuropathic pain. She presented to the ED via EMS due to acute dyspnea and wheezing x1 day. She was admitted last month with a similar complaint.  Per flow sheet documentation, she ate 20% of lunch yesterday and 20% of breakfast this AM. Review of orders indicates she has accepted Glucerna Shake 100% of the time offered and accepted 1/3 bottles of Ensure.   She was seen by a [place] RD on 11/27/21 at which time she met criteria for moderate malnutrition. At that time it was noted that patient does not like Ensure and that she does like Boost. She had been drinking 4-6 bottles of Boost/day prior to that admission.  Weight yesterday was 83 lb and weight on 04/20/22 was 89 lb. This indicates 6 lb weight loss (6.7% body weight) in the past 1 month; significant for time frame.   Patient was seen by a SLP yesterday and recommendation was  made for Regular, nectar-thick liquids   Labs reviewed. Medications reviewed; 120 mg solu-medrol/day.    NUTRITION - FOCUSED PHYSICAL EXAM:  RD working remotely.  Diet Order:   Diet Order             DIET SOFT Room service appropriate? Yes; Fluid consistency: Thin  Diet effective now                   EDUCATION NEEDS:   Not appropriate for education at this time  Skin:  Skin Assessment: Reviewed RN Assessment  Last BM:  6/17 and 6/18, type 5 x1 each day, both smears  Height:   Ht Readings from Last 1 Encounters:  05/24/22 5' 2"  (1.575 m)    Weight:   Wt Readings from Last 1 Encounters:  05/25/22 37.7 kg    BMI:  Body mass index is 15.2 kg/m.  Estimated Nutritional Needs:  Kcal:  1550-1800 kcal Protein:  80-95 grams Fluid:  >/= 1.7 L/day      Jarome Matin, MS, RD, LDN, CNSC Registered Dietitian II Inpatient Clinical Nutrition RD pager # and on-call/weekend pager # available in [facility]

## 2022-05-26 NOTE — Progress Notes (Signed)
PROGRESS NOTE    Michele Lucas  [id number] DOB: 1965/12/18 DOA: 05/24/2022 PCP: Truman Hayward, MD   Brief Narrative:  The patient is a thin cachectic African-American female with a past medical history significant for but not limited to anxiety and depression, PTSD, chronic back pain, history of COPD emphysema, history of endometriosis, fibromyalgia, grade 1 diastolic dysfunction, history of nephrolithiasis and neuropathic pain as well as other comorbidities who was brought to the ED via EMS due to acute dyspnea with wheezing since the evening prior to admission.  EMS described that she is wheezing and having chest tightness and using accessory muscles.  She was admitted last month with a similar complaint and she received 10 mg of continuous albuterol as well as 0.5 mg of ipratropium, 2 mg of mag sulfate and 125 mg of methylprednisolone.  She ended up being placed on the BiPAP and requested something for claustrophobia due to the BiPAP.  Given her presenting symptoms she was placed on IV antibiotics with ceftriaxone and azithromycin however refused to ceftriaxone given her allergies and was given another continuous DuoNeb and several hours later she was given another 5 mg of albuterol.  She felt very anxious afterwards and was given lorazepam.  She had a VBG done and basic blood work.  Portable chest x-ray showed pneumonia versus atelectasis in the infrahilar left lobe and CT of the chest was done and showed no PE but was positive for emphysema and patchy interstitial thickening with the greatest being in the right upper lobe.  She was admitted for acute respiratory failure with hypoxia and hypercarbia in the setting of acute COPD exacerbation associated with likely community-acquired pneumonia and was placed on antibiotics and steroids as well as scheduled and as needed bronchodilators.  Is slowly improving but not back to her baseline.  We will continue steroids for now and PT OT recommending no  follow-up.  Nutritionist evaluated given her underweight and they are discontinuing her Ensure and Glucerna shakes and they are Ordering boost breeze twice daily as well as boost plus 3 times daily and changing her diet to regular diet with nectar thick liquids per SLP recommendations   Assessment and Plan:  Acute on chronic respiratory failure with hypoxia and hypercapnia (HCC) In the setting of:   COPD with acute exacerbation ([place]) Associated with:   CAP (community acquired pneumonia) -Observation/PCU and changed to inpatient -Continue supplemental oxygen.  She has home oxygen however she is never Received methylprednisolone 125 mg IVP x1. -She is to be placed on p.o. prednisone however we have changed this to IV Solu-Medrol we will continue Solu-Medrol today and wean slowly -Scheduled and as needed bronchodilators.  Will adjust nebs and added Brovana Continue ceftriaxone and azithromycin but she refuses ceftriaxone and will discontinue azithromycin and changed to doxycycline -Check sputum Gram stain, culture and sensitivity. Check strep pneumoniae urinary antigen. -Flutter Valve and Incentive Spirometry  -Respiratory virus panel and COVID testing was negative -SpO2: 98 % O2 Flow Rate (L/min): 3 L/min FiO2 (%): 30 % -Patient will need an Ambulatory home O2 screen prior to discharge and repeat chest x-ray in a.m. -Chest x-ray this a.m. showed no active disease   Generalized Anxiety Disorder -Lorazepam 0.5 mg IV x1. -Resume home Hydroxyzine   Thrombocytopenia (HCC) -Monitor platelet count.  And platelet count went from 122 is now 139 yesterday and today it is now 138 -Continue to monitor for signs and symptoms of bleeding; no overt bleeding noted -Repeat CBC in a.m.   Underweight -  Protein levels are normal. -Have consulted nutrition and they have discontinued her Ensure or Glucerna shakes in order to boost breeze twice daily as well as boost plus 3 times daily and ordered 1  tablet of multivitamin with minerals daily and changed her diet to regular diet with nectar thick liquids per SLP recommendations will simply take as needed and they are going to complete an NFPE when feasible   Grade I Diastolic Dysfunction -No clinical signs of volume overload. -Echo shows grade 1 diastolic dysfunction still   Hypophosphatemia -Patient's Phos Level was 2.3 and improved to 3.1 -Replete with po K Phos Neutral 500 mg x1 yesterday  -Continue to Monitor and Replete as Necessary -Repeat Phos Level in the AM    Hyperbilirubinemia -T Bili went from 1.5 -> 1.3 -> 0.9 -Continue to Monitor and Trend   DVT prophylaxis: enoxaparin (LOVENOX) injection 30 mg Start: 05/24/22 1200    Code Status: Full Code Family Communication: No family present at bedside   Disposition Plan:  Level of care: Progressive Status is: Inpatient Remains inpatient appropriate because: Continues to be SOB   Consultants:  Admitting Physician discussed with Pulmonary  Procedures:  CTA PE Protocol  ECHOCARDIOGRAM IMPRESSIONS     1. Old study not available to review.   2. Left ventricular ejection fraction, by estimation, is 60 to 65%. The  left ventricle has normal function. The left ventricle has no regional  wall motion abnormalities. Left ventricular diastolic parameters are  consistent with Grade I diastolic  dysfunction (impaired relaxation).   3. Right ventricular systolic function is normal. The right ventricular  size is normal.   4. The mitral valve is abnormal. Trivial mitral valve regurgitation. No  evidence of mitral stenosis.   5. AV not well seen due to respiratory /lung interference Calcified and  on PSL images bows like it may be bicuspid . The aortic valve was not well  visualized. There is severe calcifcation of the aortic valve. There is  severe thickening of the aortic  valve. Aortic valve regurgitation is moderate. Moderate aortic valve  stenosis.   6. The inferior  vena cava is normal in size with greater than 50%  respiratory variability, suggesting right atrial pressure of 3 mmHg.   FINDINGS   Left Ventricle: Left ventricular ejection fraction, by estimation, is 60  to 65%. The left ventricle has normal function. The left ventricle has no  regional wall motion abnormalities. The left ventricular internal cavity  size was normal in size. There is   no left ventricular hypertrophy. Left ventricular diastolic parameters  are consistent with Grade I diastolic dysfunction (impaired relaxation).   Right Ventricle: The right ventricular size is normal. No increase in  right ventricular wall thickness. Right ventricular systolic function is  normal.   Left Atrium: Left atrial size was normal in size.   Right Atrium: Right atrial size was normal in size.   Pericardium: There is no evidence of pericardial effusion.   Mitral Valve: The mitral valve is abnormal. There is mild thickening of  the mitral valve leaflet(s). There is mild calcification of the mitral  valve leaflet(s). Trivial mitral valve regurgitation. No evidence of  mitral valve stenosis.   Tricuspid Valve: The tricuspid valve is normal in structure. Tricuspid  valve regurgitation is not demonstrated. No evidence of tricuspid  stenosis.   Aortic Valve: AV not well seen due to respiratory /lung interference  Calcified and on PSL images bows like it may be bicuspid. The aortic valve  was not well visualized. There is severe calcifcation of the aortic valve.  There is severe thickening of the  aortic valve. Aortic valve regurgitation is moderate. Aortic regurgitation  PHT measures 472 msec. Moderate aortic stenosis is present. Aortic valve  mean gradient measures 18.0 mmHg. Aortic valve peak gradient measures 29.1  mmHg. Aortic valve area, by VTI   measures 0.73 cm.   Pulmonic Valve: The pulmonic valve was normal in structure. Pulmonic valve  regurgitation is not visualized. No  evidence of pulmonic stenosis.   Aorta: The aortic root is normal in size and structure.   Venous: The inferior vena cava is normal in size with greater than 50%  respiratory variability, suggesting right atrial pressure of 3 mmHg.   IAS/Shunts: No atrial level shunt detected by color flow Doppler.   Additional Comments: Old study not available to review.      LEFT VENTRICLE  PLAX 2D  LVIDd:         4.00 cm  LVIDs:         2.60 cm  LV PW:         0.70 cm  LV IVS:        0.60 cm  LVOT diam:     1.80 cm  LV SV:         34  LV SV Index:   25  LVOT Area:     2.54 cm      RIGHT VENTRICLE  RV S prime:     14.50 cm/s  TAPSE (M-mode): 1.8 cm   LEFT ATRIUM             Index        RIGHT ATRIUM          Index  LA diam:        2.40 cm 1.78 cm/m   RA Area:     8.00 cm  LA Vol (A2C):   16.4 ml 12.16 ml/m  RA Volume:   15.70 ml 11.64 ml/m  LA Vol (A4C):   19.8 ml 14.68 ml/m  LA Biplane Vol: 19.1 ml 14.16 ml/m   AORTIC VALVE  AV Area (Vmax):    1.04 cm  AV Area (Vmean):   0.82 cm  AV Area (VTI):     0.73 cm  AV Vmax:           269.50 cm/s  AV Vmean:          200.000 cm/s  AV VTI:            0.458 m  AV Peak Grad:      29.1 mmHg  AV Mean Grad:      18.0 mmHg  LVOT Vmax:         110.00 cm/s  LVOT Vmean:        64.600 cm/s  LVOT VTI:          0.132 m  LVOT/AV VTI ratio: 0.29  AI PHT:            472 msec     AORTA  Ao Root diam: 2.70 cm      SHUNTS  Systemic VTI:  0.13 m  Systemic Diam: 1.80 cm   Antimicrobials:  Anti-infectives (From admission, onward)    Start     Dose/Rate Route Frequency Ordered Stop   05/26/22 2200  doxycycline (VIBRA-TABS) tablet 100 mg        100 mg Oral Every 12 hours 05/26/22 0950     05/26/22  0600  doxycycline (VIBRAMYCIN) 100 mg in sodium chloride 0.9 % 250 mL IVPB  Status:  Discontinued        100 mg 125 mL/hr over 120 Minutes Intravenous Every 12 hours 05/25/22 1005 05/26/22 0950   05/25/22 1000  doxycycline (VIBRAMYCIN) 100 mg in  sodium chloride 0.9 % 250 mL IVPB  Status:  Discontinued        100 mg 125 mL/hr over 120 Minutes Intravenous Every 12 hours 05/25/22 0904 05/25/22 1005   05/25/22 0600  cefTRIAXone (ROCEPHIN) 2 g in sodium chloride 0.9 % 100 mL IVPB  Status:  Discontinued        2 g 200 mL/hr over 30 Minutes Intravenous Every 24 hours 05/24/22 0852 05/25/22 0904   05/25/22 0600  azithromycin (ZITHROMAX) 500 mg in sodium chloride 0.9 % 250 mL IVPB  Status:  Discontinued        500 mg 250 mL/hr over 60 Minutes Intravenous Every 24 hours 05/24/22 0852 05/25/22 1002   05/24/22 0545  cefTRIAXone (ROCEPHIN) 1 g in sodium chloride 0.9 % 100 mL IVPB        1 g 200 mL/hr over 30 Minutes Intravenous  Once 05/24/22 0537 05/24/22 0714   05/24/22 0545  azithromycin (ZITHROMAX) 500 mg in sodium chloride 0.9 % 250 mL IVPB        500 mg 250 mL/hr over 60 Minutes Intravenous  Once 05/24/22 0537 05/24/22 0812        Subjective: And examined at bedside she is resting but thinks she is doing a little bit better.  States that her she is coughing up more sputum now.  Denies any lightheadedness or dizziness.  No other concerns or complaints at this time  Objective: Vitals:   05/26/22 0734 05/26/22 0900 05/26/22 1153 05/26/22 1640  BP:  113/75 113/73 100/67  Pulse:  (!) 102 97 (!) 107  Resp:  (!) 22  19  Temp:   98.9 F (37.2 C) 98.6 F (37 C)  TempSrc:   Oral Oral  SpO2: 100% 94% 98% 98%  Weight:      Height:        Intake/Output Summary (Last 24 hours) at 05/26/2022 1735 Last data filed at 05/26/2022 1500 Gross per 24 hour  Intake 460 ml  Output 600 ml  Net -140 ml   Filed Weights   05/24/22 0334 05/25/22 0320  Weight: 39.9 kg 37.7 kg   Examination: Physical Exam:  Constitutional: Thin cachectic chronically ill-appearing African-American female who is in no acute distress appears fatigued but is more awake today and not in as much distress Respiratory: Diminished to auscultation bilaterally with slightly  coarse breath sounds and some mild wheezing, no wheezing, rales, rhonchi or crackles. Normal respiratory effort and patient is not tachypenic. No accessory muscle use.  Unlabored breathing but is wearing supplemental oxygen via nasal cannula Cardiovascular: RRR, no murmurs / rubs / gallops. S1 and S2 auscultated. No extremity edema.  Abdomen: Soft, non-tender, non-distended. Bowel sounds positive.  GU: Deferred. Musculoskeletal: No clubbing / cyanosis of digits/nails. No joint deformity upper and lower extremities. Neurologic: CN 2-12 grossly intact with no focal deficits. Romberg sign and cerebellar reflexes not assessed.  Psychiatric: Normal judgment and insight.  She is fatigued but easily arousable  Data Reviewed: I have personally reviewed following labs and imaging studies  CBC: Recent Labs  Lab 05/24/22 0750 05/25/22 0512 05/26/22 0530  WBC 10.9* 10.2 8.8  NEUTROABS 10.5* 7.9* 6.3  HGB 12.2 12.9 12.5  HCT 39.5 41.6 39.2  MCV 101.0* 100.7* 100.0  PLT 122* 139* 0000000*   Basic Metabolic Panel: Recent Labs  Lab 05/24/22 0409 05/24/22 0606 05/25/22 0819 05/26/22 0530  NA 138  --  141 141  K 4.2  --  4.3 4.6  CL 100  --  105 106  CO2 30  --  31 30  GLUCOSE 140*  --  110* 80  BUN 10  --  16 16  CREATININE 0.49  --  0.64 0.56  CALCIUM 9.0  --  9.7 9.6  MG  --   --  2.2 2.3  PHOS  --  2.8 2.3* 3.1   GFR: Estimated Creatinine Clearance: 46.2 mL/min (by C-G formula based on SCr of 0.56 mg/dL). Liver Function Tests: Recent Labs  Lab 05/24/22 0606 05/25/22 0819 05/26/22 0530  AST 18 17 13*  ALT 13 12 11   ALKPHOS 56 50 46  BILITOT 1.5* 1.3* 0.9  PROT 7.4 6.7 6.3*  ALBUMIN 4.2 3.7 3.4*   No results for input(s): "LIPASE", "AMYLASE" in the last 168 hours. No results for input(s): "AMMONIA" in the last 168 hours. Coagulation Profile: No results for input(s): "INR", "PROTIME" in the last 168 hours. Cardiac Enzymes: No results for input(s): "CKTOTAL", "CKMB",  "CKMBINDEX", "TROPONINI" in the last 168 hours. BNP (last 3 results) No results for input(s): "PROBNP" in the last 8760 hours. HbA1C: No results for input(s): "HGBA1C" in the last 72 hours. CBG: No results for input(s): "GLUCAP" in the last 168 hours. Lipid Profile: No results for input(s): "CHOL", "HDL", "LDLCALC", "TRIG", "CHOLHDL", "LDLDIRECT" in the last 72 hours. Thyroid Function Tests: No results for input(s): "TSH", "T4TOTAL", "FREET4", "T3FREE", "THYROIDAB" in the last 72 hours. Anemia Panel: No results for input(s): "VITAMINB12", "FOLATE", "FERRITIN", "TIBC", "IRON", "RETICCTPCT" in the last 72 hours. Sepsis Labs: Recent Labs  Lab 05/24/22 0606 05/25/22 0512 05/26/22 0530  PROCALCITON <0.10 0.12 <0.10    Recent Results (from the past 240 hour(s))  SARS Coronavirus 2 by RT PCR (hospital order, performed in [facility] hospital lab) *cepheid single result test* Anterior Nasal Swab     Status: None   Collection Time: 05/24/22  9:42 AM   Specimen: Anterior Nasal Swab  Result Value Ref Range Status   SARS Coronavirus 2 by RT PCR NEGATIVE NEGATIVE Final    Comment: (NOTE) SARS-CoV-2 target nucleic acids are NOT DETECTED.  The SARS-CoV-2 RNA is generally detectable in upper and lower respiratory specimens during the acute phase of infection. The lowest concentration of SARS-CoV-2 viral copies this assay can detect is 250 copies / mL. A negative result does not preclude SARS-CoV-2 infection and should not be used as the sole basis for treatment or other patient management decisions.  A negative result may occur with improper specimen collection / handling, submission of specimen other than nasopharyngeal swab, presence of viral mutation(s) within the areas targeted by this assay, and inadequate number of viral copies (<250 copies / mL). A negative result must be combined with clinical observations, patient history, and epidemiological information.  Fact Sheet for Patients:    https://www.patel.info/  Fact Sheet for Healthcare Providers: https://hall.com/  This test is not yet approved or  cleared by the [place] FDA and has been authorized for detection and/or diagnosis of SARS-CoV-2 by FDA under an Emergency Use Authorization (EUA).  This EUA will remain in effect (meaning this test can be used) for the duration of the COVID-19 declaration under Section 564(b)(1) of the Act, 21 U.S.C.  section 360bbb-3(b)(1), unless the authorization is terminated or revoked sooner.  Performed at [facility], [address] [place], [place] [address]   Respiratory (~20 pathogens) panel by PCR     Status: None   Collection Time: 05/24/22 11:10 AM   Specimen: Anterior Nasal Swab; Respiratory  Result Value Ref Range Status   Adenovirus NOT DETECTED NOT DETECTED Final   Coronavirus 229E NOT DETECTED NOT DETECTED Final    Comment: (NOTE) The Coronavirus on the Respiratory Panel, DOES NOT test for the novel  Coronavirus (2019 nCoV)    Coronavirus HKU1 NOT DETECTED NOT DETECTED Final   Coronavirus NL63 NOT DETECTED NOT DETECTED Final   Coronavirus OC43 NOT DETECTED NOT DETECTED Final   Metapneumovirus NOT DETECTED NOT DETECTED Final   Rhinovirus / Enterovirus NOT DETECTED NOT DETECTED Final   Influenza A NOT DETECTED NOT DETECTED Final   Influenza B NOT DETECTED NOT DETECTED Final   Parainfluenza Virus 1 NOT DETECTED NOT DETECTED Final   Parainfluenza Virus 2 NOT DETECTED NOT DETECTED Final   Parainfluenza Virus 3 NOT DETECTED NOT DETECTED Final   Parainfluenza Virus 4 NOT DETECTED NOT DETECTED Final   Respiratory Syncytial Virus NOT DETECTED NOT DETECTED Final   Bordetella pertussis NOT DETECTED NOT DETECTED Final   Bordetella Parapertussis NOT DETECTED NOT DETECTED Final   Chlamydophila pneumoniae NOT DETECTED NOT DETECTED Final   Mycoplasma pneumoniae NOT DETECTED NOT DETECTED Final    Comment:  Performed at [facility], [address], [place], [place] [address]     Radiology Studies: DG CHEST PORT 1 VIEW  Result Date: 05/26/2022 CLINICAL DATA:  Shortness of breath. EXAM: PORTABLE CHEST 1 VIEW COMPARISON:  05/25/2022 FINDINGS: Lungs are adequately inflated without focal airspace consolidation or effusion. Cardiomediastinal silhouette and remainder of the exam is unchanged. IMPRESSION: No active disease. Electronically Signed   By: Elberta Fortis M.D.   On: 05/26/2022 08:25   DG CHEST PORT 1 VIEW  Result Date: 05/25/2022 CLINICAL DATA:  Cough today with concern for pneumonia. EXAM: PORTABLE CHEST 1 VIEW COMPARISON:  05/24/2022 FINDINGS: Lungs are adequately inflated without focal airspace consolidation or effusion. Cardiomediastinal silhouette and remainder of the exam is unchanged. IMPRESSION: No active disease. Electronically Signed   By: Elberta Fortis M.D.   On: 05/25/2022 09:06     Scheduled Meds:  doxycycline  100 mg Oral Q12H   enoxaparin (LOVENOX) injection  30 mg Subcutaneous Daily   feeding supplement  1 Container Oral BID BM   ipratropium  0.5 mg Nebulization Q6H   lactose free nutrition  237 mL Oral TID WC   levalbuterol  0.63 mg Nebulization Q6H   methylPREDNISolone (SOLU-MEDROL) injection  120 mg Intravenous Q24H   multivitamin with minerals  1 tablet Oral Daily   sodium chloride HYPERTONIC  4 mL Nebulization Daily   Continuous Infusions:   LOS: 1 day   Marguerita Merles, DO [facility] Available via Epic secure chat 7am-7pm After these hours, please refer to coverage provider listed on [email or web address] 05/26/2022, 5:35 PM

## 2022-05-27 ENCOUNTER — Inpatient Hospital Stay (HOSPITAL_COMMUNITY): Payer: Medicare PPO

## 2022-05-27 DIAGNOSIS — F411 Generalized anxiety disorder: Secondary | ICD-10-CM | POA: Diagnosis not present

## 2022-05-27 DIAGNOSIS — J441 Chronic obstructive pulmonary disease with (acute) exacerbation: Secondary | ICD-10-CM | POA: Diagnosis not present

## 2022-05-27 DIAGNOSIS — J9601 Acute respiratory failure with hypoxia: Secondary | ICD-10-CM | POA: Diagnosis not present

## 2022-05-27 DIAGNOSIS — J189 Pneumonia, unspecified organism: Secondary | ICD-10-CM | POA: Diagnosis not present

## 2022-05-27 LAB — COMPREHENSIVE METABOLIC PANEL
ALT: 12 U/L (ref 0–44)
AST: 13 U/L — ABNORMAL LOW (ref 15–41)
Albumin: 3.4 g/dL — ABNORMAL LOW (ref 3.5–5.0)
Alkaline Phosphatase: 43 U/L (ref 38–126)
Anion gap: 4 — ABNORMAL LOW (ref 5–15)
BUN: 26 mg/dL — ABNORMAL HIGH (ref 6–20)
CO2: 34 mmol/L — ABNORMAL HIGH (ref 22–32)
Calcium: 9.7 mg/dL (ref 8.9–10.3)
Chloride: 106 mmol/L (ref 98–111)
Creatinine, Ser: 0.71 mg/dL (ref 0.44–1.00)
GFR, Estimated: >60 mL/min (ref >60)
Glucose, Bld: 105 mg/dL — ABNORMAL HIGH (ref 70–99)
Potassium: 4.5 mmol/L (ref 3.5–5.1)
Sodium: 144 mmol/L (ref 135–145)
Total Bilirubin: 1 mg/dL (ref 0.3–1.2)
Total Protein: 6.2 g/dL — ABNORMAL LOW (ref 6.5–8.1)

## 2022-05-27 LAB — CBC WITH DIFFERENTIAL/PLATELET
Abs Immature Granulocytes: 0.04 10*3/uL (ref 0.00–0.07)
Basophils Absolute: 0 10*3/uL (ref 0.0–0.1)
Basophils Relative: 0 %
Eosinophils Absolute: 0 10*3/uL (ref 0.0–0.5)
Eosinophils Relative: 0 %
HCT: 39.2 % (ref 36.0–46.0)
Hemoglobin: 12.2 g/dL (ref 12.0–15.0)
Immature Granulocytes: 0 %
Lymphocytes Relative: 14 %
Lymphs Abs: 1.3 10*3/uL (ref 0.7–4.0)
MCH: 31.4 pg (ref 26.0–34.0)
MCHC: 31.1 g/dL (ref 30.0–36.0)
MCV: 101 fL — ABNORMAL HIGH (ref 80.0–100.0)
Monocytes Absolute: 0.9 10*3/uL (ref 0.1–1.0)
Monocytes Relative: 10 %
Neutro Abs: 6.9 10*3/uL (ref 1.7–7.7)
Neutrophils Relative %: 76 %
Platelets: 150 10*3/uL (ref 150–400)
RBC: 3.88 MIL/uL (ref 3.87–5.11)
RDW: 11.9 % (ref 11.5–15.5)
WBC: 9.1 10*3/uL (ref 4.0–10.5)
nRBC: 0 % (ref 0.0–0.2)

## 2022-05-27 LAB — MAGNESIUM: Magnesium: 2.3 mg/dL (ref 1.7–2.4)

## 2022-05-27 LAB — PHOSPHORUS: Phosphorus: 3.2 mg/dL (ref 2.5–4.6)

## 2022-05-27 MED ORDER — DOXYCYCLINE HYCLATE 100 MG PO TABS: 100.0000 mg | ORAL_TABLET | Freq: Two times a day (BID) | ORAL | 0 refills | Status: AC

## 2022-05-27 MED ORDER — BENZONATATE 200 MG PO CAPS: 200.0000 mg | ORAL_CAPSULE | Freq: Three times a day (TID) | ORAL | 0 refills | Status: AC | PRN

## 2022-05-27 MED ORDER — ADULT MULTIVITAMIN W/MINERALS CH: 1.0000 | ORAL_TABLET | Freq: Every day | ORAL | 0 refills | Status: DC

## 2022-05-27 MED ORDER — BOOST PLUS PO LIQD: 237.0000 mL | Freq: Three times a day (TID) | ORAL | 0 refills | Status: AC

## 2022-05-27 MED ORDER — METHYLPREDNISOLONE 4 MG PO TBPK: ORAL_TABLET | ORAL | 0 refills | Status: DC

## 2022-05-27 NOTE — Progress Notes (Signed)
SATURATION QUALIFICATIONS: (This note is used to comply with regulatory documentation for home oxygen)  Patient Saturations on Room Air at Rest = 85%  Patient Saturations on Room Air while Ambulating = 87%  Patient Saturations on 1  Liters of oxygen while Ambulating = 90%  Please briefly explain why patient needs home oxygen:

## 2022-05-27 NOTE — TOC Transition Note (Addendum)
Transition of Care [facility]) - CM/SW Discharge Note   Patient Details  Name: [name] MRN: [id number] Date of Birth: 03-Aug-1966  [facility]) CM/SW Contact:  Lanier Clam, RN Phone Number: 05/27/2022, 4:25 PM   Clinical Narrative:d/c plan home. Has home 02 No CM needs.   -4:38p-Noted patient ordered for home 02-uses [employer] states that she paid out & currently having trouble with equipment-informed to contact them for their concerns-[employer] rep Duwayne Heck will deliver travel tank to rm prior d/c. No further CM needs.     Final next level of care: Home/Self Care Barriers to Discharge: No Barriers Identified   Patient Goals and CMS Choice Patient states their goals for this hospitalization and ongoing recovery are:: Home CMS Medicare.gov Compare Post Acute Care list provided to:: Patient Choice offered to / list presented to : Patient  Discharge Placement                       Discharge Plan and Services                                     Social Determinants of Health (SDOH) Interventions     Readmission Risk Interventions     No data to display

## 2022-05-27 NOTE — Progress Notes (Signed)
  Nutrition Focused Physical Exam:  Subcutaneous Fat:  Orbital Region: severe depletions Upper Arm Region: severe depletions Thoracic and Lumbar Region: unable to assess  Muscle:  Temple Region: severe depletions Clavicle Bone Region: severe depletions Clavicle and Acromion Bone Region: severe depletions Scapular Bone Region: severe depletions Dorsal Hand: moderate depletions Patellar Region: severe depletions Anterior Thigh Region: severe depletions  Posterior Calf Region: severe depletions  Edema: none   Able to talk with patient and her daughter at bedside. Placed handouts in AVS. Full note to follow. Patient to d/c today per MD.      Trenton Gammon, MS, RD, LDN, CNSC Registered Dietitian II Inpatient Clinical Nutrition RD pager # and on-call/weekend pager # available in [facility]

## 2022-05-27 NOTE — Progress Notes (Signed)
Modified Barium Swallow Progress Note  Patient Details  Name: Shawntel Farnworth MRN: [id number] Date of Birth: 18-Jan-1966  Today's Date: 05/27/2022  Modified Barium Swallow completed.  Full report located under Chart Review in the Imaging Section.  Brief recommendations include the following:  Clinical Impression  Patient presenting with an oropharyngeal swallow that is WFL-WNL as per this Modified Barium Swallow study. She briefly held liquids in her mouth before swallowing which appeared to be a conscious action. No swallow initiation delays observed with thin liquid barium or 1/4 of 43mm barium tablet and only trace vallecular residuals with thin liquids which cleared completely. No incidents of aspiration or penetration, UES opening appeared WNL and esophageal sweep did not reveal anything concerning. SLP is recommending continue with regular solids, thin liquids and no f/u needed at this time.   Swallow Evaluation Recommendations       SLP Diet Recommendations: Regular solids;Thin liquid   Liquid Administration via: Cup;Straw   Medication Administration: Whole meds with liquid   Supervision: Patient able to self feed   Compensations: Slow rate;Small sips/bites   Postural Changes: Seated upright at 90 degrees            Angela Nevin, MA, CCC-SLP Speech Therapy

## 2022-05-27 NOTE — Progress Notes (Signed)
Patient discharged home with daughter, home oxygen delivered to room and taken home with patient. Patient alert and oriented, denies any pain/distress, discharge instructions given and explained to patient, patient verbalized understanding, no pressure injury noted, accompanied home by daughter, transported to the car by staff.

## 2022-05-27 NOTE — Progress Notes (Signed)
Nutrition Follow-up  DOCUMENTATION CODES:   Severe malnutrition in context of chronic illness, Underweight  INTERVENTION:  - continue Boost Breeze BID and Boost Plus TID. - High Calorie, High Protein Nutrition Therapy and High Protein Nutrition Therapy handouts in AVS.  - will add smart phrase in AVS for incorporating oral nutrition supplements (such as Boost Plus, Premier Protein, or homemade smoothies with protein powder) BID for at least 1 month after d/c.    NUTRITION DIAGNOSIS:   Severe Malnutrition related to chronic illness as evidenced by severe fat depletion, severe muscle depletion. -revised, ongoing  GOAL:   Patient will meet greater than or equal to 90% of their needs -progressing  MONITOR:   PO intake, Supplement acceptance, Labs, Weight trends  ASSESSMENT:   56 y.o. female with medical history of anxiety, PTSD, chronic back pain, COPD, emphysema, endometriosis, fibromyalgia, grade 1 diastolic dysfunction, kidney stone, and neuropathic pain. She presented to the ED via EMS due to acute dyspnea and wheezing x1 day. She was admitted last month with a similar complaint.  Patient sitting up in bed with one of her daughters at bedside. Both share some of the difficulties patient has experienced in the past few years to include losing her husband and the loss of her teeth due to medication side effect; both of these have greatly impacted her nutrition status.   Patient has a great support system in family and friends and is interested in weight restoration and regaining muscle. She shares that she has many beautiful pieces of clothing and would like to fit in them again. She shares the importance of spending time with her children and grandchildren.   Discussed ways to add calories and protein to foods to maximize bites and sips, especially during fibromyalgia flares (such as protein powder, making smoothies, incorporating healthy fats such as nut butters, olive oil, avocado,  nuts, and cheese).   Daughter shares that focusing on eating consistently may be helpful for patient as she moves toward her goals. Her daughter expresses concern that patient has not been eating consistently and that some food choices are not as beneficial in providing the nutrients patient needs to reach her goals.   Discussed adding a daily multivitamin in to aid in filling gaps in nutrient intake.    Labs reviewed; BUN: 26 mg/dl. Medications reviewed; 120 mg solu-medrol/day, 1 tablet multivitamin with minerals/day.    NUTRITION - FOCUSED PHYSICAL EXAM:  Flowsheet Row Most Recent Value  Orbital Region Severe depletion  Upper Arm Region Severe depletion  Thoracic and Lumbar Region Unable to assess  Buccal Region Severe depletion  Temple Region Moderate depletion  Clavicle Bone Region Severe depletion  Clavicle and Acromion Bone Region Severe depletion  Scapular Bone Region Severe depletion  Dorsal Hand Severe depletion  Patellar Region Severe depletion  Anterior Thigh Region Severe depletion  Posterior Calf Region Severe depletion  Edema (RD Assessment) None  Hair Reviewed  Eyes Reviewed  Mouth Reviewed  [poor dentition]  Skin Reviewed  Nails Unable to assess  [nail polish]       Diet Order:   Diet Order             Diet regular Room service appropriate? Yes; Fluid consistency: Thin  Diet effective now           Diet - low sodium heart healthy                   EDUCATION NEEDS:   Education needs have been addressed  Skin:  Skin Assessment: Reviewed RN Assessment  Last BM:  6/17 and 6/18, type 5 x1 each day, both smears  Height:   Ht Readings from Last 1 Encounters:  05/24/22 5\' 2"  (1.575 m)    Weight:   Wt Readings from Last 1 Encounters:  05/25/22 37.7 kg     BMI:  Body mass index is 15.2 kg/m.  Estimated Nutritional Needs:  Kcal:  1550-1800 kcal Protein:  80-95 grams Fluid:  >/= 1.7 L/day     05/27/22, MS, RD, LDN,  CNSC Registered Dietitian II Inpatient Clinical Nutrition RD pager # and on-call/weekend pager # available in [facility]

## 2022-05-27 NOTE — Discharge Instructions (Addendum)
High-Calorie, High-Protein Nutrition Therapy (2021) A high-calorie, high-protein diet has been recommended to you. Your registered dietitian nutritionist (RDN) may have recommended this diet because you are having difficulty eating enough calories throughout the day, you have lost weight, and/or you need to add protein to your diet. Sometimes you may not feel like eating, even if you know the importance of good nutrition. The recommendations in this handout can help you with the following: Regaining your strength and energy Keeping your body healthy Healing and recovering from surgery or illness and fighting infection Tips: Schedule Your Meals and Snacks Several small meals and snacks are often better tolerated and digested than large meals. Strategies Plan to eat 3 meals and 3 snacks daily. Experiment with timing meals to find out when you have a larger appetite. Appetite may be greatest in the morning after not eating all night so you may prefer to eat your larger meals and snacks in the morning and at lunch. Breakfast-type foods are often better tolerated so eat foods such as eggs, pancakes, waffles and cereal for any meal or snack. Carry snacks with you so you are prepared to eat every 2 to 3 hours. Determine what works best for you if your body's cues for feeling hungry or full are not working. Eat a small meal or snack even if you don't feel hungry. Set a timer to remind you when it is time to eat. Take a walk before you eat (with health care provider's approval). Light or moderate physical activity can help you maintain muscle and increase your appetite. Make Eating Enjoyable Taking steps to make the experience enjoyable may help to increase your interest in eating and improve your appetite. Strategies: Eat with others whenever possible. Include your favorite foods to make meals more enjoyable. Try new foods. Save your beverage for the end of the meal so that you have more room for  food before you get full. Add Calories to Your Meals and Snacks Try adding calorie-dense foods so that each bite provides more nutrition. Strategies Drink milk, chocolate milk, soy milk, or smoothies instead of low-calorie beverages such as diet drinks or water. Cook with milk or soy milk instead of water when making dishes such as hot cereal, cocoa, or pudding. Add jelly, jam, honey, butter or margarine to bread and crackers. Add jam or fruit to ice cream and as a topping over cake. Mix dried fruit, nuts, granola, honey, or dry cereal with yogurt or hot cereals. Enjoy snacks such as milkshakes, smoothies, pudding, ice cream, or custard. Blend a fruit smoothie of a banana, frozen berries, milk or soy milk, and 1 tablespoon nonfat powdered milk or protein powder. Add Protein to Your Meals and Snacks Choose at least one protein food at each meal and snack to increase your daily intake. Strategies Add  cup nonfat dry milk powder or protein powder to make a high-protein milk to drink or to use in recipes that call for milk. Vanilla or peppermint extract or unsweetened cocoa powder could help to boost the flavor. Add hard-cooked eggs, leftover meat, grated cheese, canned beans or tofu to noodles, rice, salads, sandwiches, soups, casseroles, pasta, tuna and other mixed dishes. Add powdered milk or protein powder to hot cereals, meatloaf, casseroles, scrambled eggs, sauces, cream soups, and shakes. Add beans and lentils to salads, soups, casseroles, and vegetable dishes. Eat cottage cheese or yogurt, especially Greek yogurt, with fruit as a snack or dessert. Eat peanut or other nut butters on crackers, bread, toast,   waffles, apples, bananas or celery sticks. Add it to milkshakes, smoothies, or desserts. Consider a ready-made protein shake. Your RDN will make recommendations. Add Fats to Your Meals and Snacks Try adding fats to your meals and snacks. Fat provides more calories in fewer bites than  carbohydrate or protein and adds flavors to your foods. Strategies Snack on nuts and seeds or add them to foods like salads, pasta, cereals, yogurt, and ice cream.  Saut or stir-fry vegetables, meats, chicken, fish or tofu in olive or canola oil.  Add olive oil, other vegetable oils, butter or margarine to soups, vegetables, potatoes, cooked cereal, rice, pasta, bread, crackers, pancakes, or waffles. Snack on olives or add to pasta, pizza, or salad. Add avocado or guacamole to your salads, sandwiches, and other entrees. Include fatty fish such as salmon in your weekly meal plan. For general food safety tips, especially for clients with immunocompromised conditions, ask your RDN for the Food Safety Nutrition Therapy handout. Small Meal and Snack Ideas These snacks and meals are recommended when you have to eat but aren't necessarily hungry.  They are good choices because they are high in protein and high in calories.  2 graham crackers 2 tablespoons peanut or other nut butter 1 cup milk 2 slices whole wheat toast topped with:  avocado, mashed Seasoning of your choice   cup Greek yogurt  cup fruit  cup granola 2 deviled egg halves 5 whole wheat crackers  1 cup cream of tomato soup  grilled cheese sandwich 1 toasted waffle topped with: 2 tablespoons peanut or nut butter 1 tablespoon jam  [place]il mix made with:  cup nuts  cup dried fruit  cup cold cereal, any variety  cup oatmeal or cream of wheat cereal 1 tablespoon peanut or nut butter  cup diced fruit   High-Calorie, High-Protein Sample 1-Day Menu View Nutrient Info Breakfast 1 egg, scrambled 1 ounce cheddar cheese 1 English muffin, whole wheat 1 tablespoon margarine 1 tablespoon jam  cup orange juice, fortified with calcium and vitamin D  Morning Snack 1 tablespoon peanut butter 1 banana 1 cup 1% milk  Lunch Tuna salad sandwich made with: 2 slices bread, whole wheat 3 ounces tuna mixed with: 1 tablespoon  mayonnaise  cup pudding  Afternoon Snack  cup hummus  cup carrots 1 pita  Evening Meal Enchilada casserole made with: 2 corn tortillas 3 ounces ground beef, cooked  cup black beans, cooked  cup corn, cooked 1 ounce grated cheddar cheese  cup enchilada sauce  avocado, sliced, topping for enchilada 1 tablespoon sour cream, topping for enchilada Salad:  cup lettuce, shredded  cup tomatoes, chopped, for salad 1 tablespoon olive oil and vinegar dressing, for salad  Evening Snack  cup Greek yogurt  cup blueberries  cup granola     High Calorie, High Protein Nutrition Therapy  A high-calorie, high-protein diet has been recommended to you. Your registered dietitian nutritionist (RDN) may have recommended this diet because you are having difficulty eating enough calories throughout the day, you have lost weight, and/or you need to add protein to your diet.  Sometimes you may not feel like eating, even if you know the importance of good nutrition. The recommendations in this handout can help you with the following:  Regaining your strength and energy Keeping your body healthy Healing and recovering from surgery or illness and fighting infection  Schedule Your Meals and Snacks  Several small meals and snacks are often better tolerated and digested than large meals.  Strategies  Plan to eat 3 meals and 3 snacks daily. Experiment with timing meals to find out when you have a larger appetite. Appetite may be greatest in the morning after not eating all night so you may prefer to eat your larger meals and snacks in the morning and at lunch. Breakfast-type foods are often better tolerated so eat foods such as eggs, pancakes, waffles and cereal for any meal or snack. Carry snacks with you so you are prepared to eat every 2 to 3 hours. Determine what works best for you if your body's cues for feeling hungry or full are not working. Eat a small meal or snack even if you don't  feel hungry. Set a timer to remind you when it is time to eat. Take a walk before you eat (with health care provider's approval). Light or moderate physical activity can help you maintain muscle and increase your appetite.  Make Eating Enjoyable  Taking steps to make the experience enjoyable may help to increase your interest in eating and improve your appetite.  Strategies:  Eat with others whenever possible. Include your favorite foods to make meals more enjoyable. Try new foods. Save your beverage for the end of the meal so that you have more room for food before you get full.  Add Calories to Your Meals and Snacks  Try adding calorie-dense foods so that each bite provides more nutrition.  Strategies  Drink milk, chocolate milk, soy milk, or smoothies instead of low-calorie beverages such as diet drinks or water. Cook with milk or soy milk instead of water when making dishes such as hot cereal, cocoa, or pudding. Add jelly, jam, honey, butter or margarine to bread and crackers. Add jam or fruit to ice cream and as a topping over cake. Mix dried fruit, nuts, granola, honey, or dry cereal with yogurt or hot cereals. Enjoy snacks such as milkshakes, smoothies, pudding, ice cream, or custard. Blend a fruit smoothie of a banana, frozen berries, milk or soy milk, and 1 tablespoon nonfat powdered milk or protein powder.  Add Protein to Your Meals and Snacks  Choose at least one protein food at each meal and snack to increase your daily intake.  Strategies  Add  cup nonfat dry milk powder or protein powder to make a high-protein milk to drink or to use in recipes that call for milk. Vanilla or peppermint extract or unsweetened cocoa powder could help to boost the flavor. Add hard-cooked eggs, leftover meat, grated cheese, canned beans or tofu to noodles, rice, salads, sandwiches, soups, casseroles, pasta, tuna and other mixed dishes. Add powdered milk or protein powder to hot  cereals, meatloaf, casseroles, scrambled eggs, sauces, cream soups, and shakes. Add beans and lentils to salads, soups, casseroles, and vegetable dishes. Eat cottage cheese or yogurt, especially Greek yogurt, with fruit as a snack or dessert. Eat peanut or other nut butters on crackers, bread, toast, waffles, apples, bananas or celery sticks. Add it to milkshakes, smoothies, or desserts. Consider a ready-made protein shake.   Add Fats to Your Meals and Snacks  Try adding fats to your meals and snacks. Fat provides more calories in fewer bites than carbohydrate or protein and adds flavors to your foods.  Strategies  Snack on nuts and seeds or add them to foods like salads, pasta, cereals, yogurt, and ice cream.  Saut or stir-fry vegetables, meats, chicken, fish or tofu in olive or canola oil.  Add olive oil, other vegetable oils, butter or margarine to soups, vegetables,  potatoes, cooked cereal, rice, pasta, bread, crackers, pancakes, or waffles. Snack on olives or add to pasta, pizza, or salad. Add avocado or guacamole to your salads, sandwiches, and other entrees. Include fatty fish such as salmon in your weekly meal plan.  Tips For Adding Protein Nutrition Therapy    Tips Add extra egg to one or more meals  Increase the portion of milk to drink and change to skim milk if able  Include [place] yogurt or cottage cheese for snack or part of a meal  Increase portion size of protein entre and decrease portion of starch/bread  Mix protein powder, nut butter, almond/nut milk, non-fat dry milk, or Greek yogurt to shakes and smoothies  Use these ingredients also in baked goods or other recipes Use double the amount of sandwich filling  Add protein foods to all snacks including cheese, nut butters, milk and yogurt  Food Tips for Including Protein  Beans Cook and use dried peas, beans, and tofu in soups or add to casseroles, pastas, and grain dishes that also contain cheese or meat  Mash  with cheese and milk  Use tofu to make smoothies  Commercial Protein Supplements Use nutritional supplements or protein powder sold at pharmacies and grocery stores  Use protein powder in milk drinks and desserts, such as pudding  Mix with ice cream, milk, and fruit or other flavorings for a high-protein milkshake  Cottage Cheese or [employer] Mix with or use to stuff fruits and vegetables  Add to casseroles, spaghetti, noodles, or egg dishes such as omelets, scrambled eggs, and souffls  Use gelatin, pudding-type desserts, cheesecake, and pancake or waffle batter  Use to stuff crepes, pasta shells, or manicotti  Puree and use as a substitute for sour cream  Eggs, Egg whites, and Egg Yolks Add chopped, hard-cooked eggs to salads and dressings, vegetables, casseroles, and creamed meats  Beat eggs into mashed potatoes, vegetable purees, and sauces  Add extra egg whites to quiches, scrambled eggs, custards, puddings, pancake batter, or [place] toast wash/batter  Make a rich custard with egg yolks, double strength milk, and sugar  Add extra hard-cooked yolks to deviled egg filling and sandwich spreads  Hard or Semi-Soft Cheese (Cheddar, Ree Kida, [place]) Melt on sandwiches, bread, muffins, tortillas, hamburgers, hot dogs, other meats or fish, vegetables, eggs, or desserts such as stewed figs or pies  Grate and add to soups, sauces, casseroles, vegetable dishes, potatoes, rice noodles, or meatloaf  Serve as a snack with crackers or bagels  Ice cream, Yogurt, and Frozen Yogurt Add to milk drinks such as milkshakes  Add to cereals, fruits, gelatin desserts, and pies  Blend or whip with soft or cooked fruits  Sandwich ice cream or frozen yogurt between enriched cake slices, cookies, or graham crackers  Use seasoned yogurt as a dip for fruits, vegetables, or chips  Use yogurt in place of sour cream in casseroles  Meat and Fish Add chopped, cooked meat or fish to vegetables, salads, casseroles, soups,  sauces, and biscuit dough  Use in omelets, souffls, quiches, and sandwich fillings  Add chicken and [place] to stuffing  Wrap in pie crust or biscuit dough as turnovers  Add to stuffed baked potatoes  Add pureed meat to soups  Milk Use in beverages and in cooking  Use in preparing foods, such as hot cereal, soups, cocoa, or pudding  Add cream sauces to vegetable and other dishes  Use evaporated milk, evaporated skim milk, or sweetened condensed milk instead of milk or  water in recipes.  Nonfat Dry Milk Add 1/3 cup of nonfat dry milk powdered milk to each cup of regular milk for "double strength" milk  Add to yogurt and milk drinks, such as pasteurized eggnog and milkshakes  Add to scrambled eggs and mashed potatoes  Use in casseroles, meatloaf, hot cereal, breads, muffins, sauces, cream soups, puddings and custards, and other milk-based desserts  Nuts, Seeds, and Wheat Germ Add to casseroles, breads, muffins, pancakes, cookies, and waffles  Sprinkle on fruit, cereal, ice cream, yogurt, vegetables, salads, and toast as a crunchy topping  Use in place of breadcrumbs  Blend with parsley or spinach, herbs, and cream for a noodle, pasta, or vegetable sauce.  Roll banana in chopped nuts  Peanut Butter Spread on sandwiches, toast, muffins, crackers, waffles, pancakes, and fruit slices  Use as a dip for raw vegetables, such as carrots, cauliflower, and celery  Blend with milk drinks, smoothies, and other beverages  Swirl through soft ice cream or yogurt  Spread on a banana then roll in crushed, dry cereal or chopped nuts   Small Meal and Snack Ideas  These snacks and meals are recommended when you have to eat but aren't necessarily hungry.  They are good choices because they are high in protein and high in calories.   2 graham crackers, 2 tablespoons peanut or other nut butter, 1 cup milk  cup [place] yogurt,  cup fruit,  cup granola 2 deviled egg halves, 5 whole wheat crackers 1 cup cream  of tomato soup,  grilled cheese sandwich 1 toasted waffle topped with: 2 tablespoons peanut or nut butter, 1 tablespoon jam Trail mix made with:  cup nuts,  cup dried fruit,  cup cold cereal, any variety  cup oatmeal or cream of wheat cereal, 1 tablespoon peanut or nut butter,  cup diced fruit  High-Calorie, High-Protein Sample 1-Day Menu   Breakfast 1 egg, scrambled 1 ounce cheddar cheese 1 English muffin, whole wheat 1 tablespoon margarine 1 tablespoon jam  cup orange juice, fortified with calcium and vitamin D  Morning Snack 1 tablespoon peanut butter 1 banana 1 cup 1% milk  Lunch Tuna salad sandwich made with: 2 slices bread, whole wheat 3 ounces tuna mixed with: 1 tablespoon mayonnaise  cup pudding  Afternoon Snack  cup hummus  cup carrots 1 pita  Evening Meal Enchilada casserole made with: 2 corn tortillas 3 ounces ground beef, cooked  cup black beans, cooked  cup corn, cooked 1 ounce grated cheddar cheese  cup enchilada sauce  avocado, sliced, topping for enchilada 1 tablespoon sour cream, topping for enchilada Salad:  cup lettuce, shredded  cup tomatoes, chopped, for salad 1 tablespoon olive oil and vinegar dressing, for salad  Evening Snack  cup Greek yogurt  cup blueberries  cup granola  Copyright 2020  [employer]. All rights reserved     Nutrition [facility] Stay Proper nutrition can help your body recover from illness and injury.   Foods and beverages high in protein, vitamins, and minerals help rebuild muscle loss, promote healing, & reduce fall risk.   In addition to eating healthy foods, a nutrition shake is an easy, delicious way to get the nutrition you need during and after your hospital stay  It is recommended that you continue to drink 2 bottles per day of: Boost Plus, Premier Protein, or homemade smoothies with protein powder for at least 1 month (30 days) after your hospital stay   Tips  for adding a  nutrition shake into your routine: As allowed, drink one with vitamins or medications instead of water or juice Enjoy one as a tasty mid-morning or afternoon snack Drink cold or make a milkshake out of it Drink one instead of milk with cereal or snacks Use as a coffee creamer   Available at the following grocery stores and pharmacies:           * Karin [employer] * [employer] * [employer]  * [facility]          * [employer] * [employer]  * [facility]      * [employer]  * [employer]   * [facility]  * [employer]   * [facility] [phone]            For COUPONS visit: [email or web address]/join or [email or web address]   Suggested Substitutions Ensure Plus = Boost Plus = Carnation Breakfast Essentials = Boost Compact Ensure Active Clear = Boost Breeze Glucerna Shake = Boost Glucose Control = Carnation Breakfast Essentials SUGAR FREE

## 2022-05-28 NOTE — Discharge Summary (Signed)
Physician Discharge Summary   Patient: Michele Lucas MRN: [id number] DOB: 08/27/1966  Admit date:     05/24/2022  Discharge date: 05/27/2022  Discharge Physician: Raiford Noble, DO   PCP: Truman Hayward, MD   Recommendations at discharge:   Follow-up with PCP within 1 to 2 weeks and repeat CBC, CMP, mag, Phos within 1 week Follow-up with pulmonary in outpatient setting for further evaluation and work-up Repeat chest x-ray in 3 to 6 weeks Follow-up with rheumatology in outpatient setting for further evaluation for suspected lupus  Discharge Diagnoses: Principal Problem:   Acute respiratory failure with hypoxia and hypercapnia ([place]) Active Problems:   CAP (community acquired pneumonia)   Generalized anxiety disorder   Thrombocytopenia ([place])   COPD with acute exacerbation ([place])   Underweight   Grade I diastolic dysfunction  Resolved Problems:   * No resolved hospital problems. [facility] Course: The patient is a thin cachectic African-American female with a past medical history significant for but not limited to anxiety and depression, PTSD, chronic back pain, history of COPD emphysema, history of endometriosis, fibromyalgia, grade 1 diastolic dysfunction, history of nephrolithiasis and neuropathic pain as well as other comorbidities who was brought to the ED via EMS due to acute dyspnea with wheezing since the evening prior to admission.  EMS described that she is wheezing and having chest tightness and using accessory muscles.  She was admitted last month with a similar complaint and she received 10 mg of continuous albuterol as well as 0.5 mg of ipratropium, 2 mg of mag sulfate and 125 mg of methylprednisolone.  She ended up being placed on the BiPAP and requested something for claustrophobia due to the BiPAP.  Given her presenting symptoms she was placed on IV antibiotics with ceftriaxone and azithromycin however refused to ceftriaxone given her allergies and was given another continuous  DuoNeb and several hours later she was given another 5 mg of albuterol.  She felt very anxious afterwards and was given lorazepam.  She had a VBG done and basic blood work.  Portable chest x-ray showed pneumonia versus atelectasis in the infrahilar left lobe and CT of the chest was done and showed no PE but was positive for emphysema and patchy interstitial thickening with the greatest being in the right upper lobe.  She was admitted for acute respiratory failure with hypoxia and hypercarbia in the setting of acute COPD exacerbation associated with likely community-acquired pneumonia and was placed on antibiotics and steroids as well as scheduled and as needed bronchodilators.  Is slowly improving but not back to her baseline.  We will continue steroids for now and PT OT recommending no follow-up.  Nutritionist evaluated given her underweight and they are discontinuing her Ensure and Glucerna shakes and they are Ordering boost breeze twice daily as well as boost plus 3 times daily and changing her diet to regular diet with nectar thick liquids per SLP recommendations  She had an MBS done which recommended regular diet with thin liquids.  She ambulated and desaturated on room air but was stable on supplemental oxygen.  She improved significantly and was deemed stable for discharge and she will need to follow-up with PCP, pulmonary and rheumatology in outpatient setting and repeated subsequently 6 weeks.  Assessment and Plan: No notes have been filed under this hospital service. Service: Hospitalist  Acute on chronic respiratory failure with hypoxia and hypercapnia ([place]), improved In the setting of:   COPD with acute exacerbation ([place]), improved Associated with:   CAP (  community acquired pneumonia), improved -Observation/PCU and changed to inpatient -Continue supplemental oxygen.  She has home oxygen however she is never Received methylprednisolone 125 mg IVP x1. -She is to be placed on p.o.  prednisone however we have changed this to IV Solu-Medrol we will continue Solu-Medrol today and wean slowly and change to a Medrol Dosepak and discharge -Scheduled and as needed bronchodilators.  Will adjust nebs and added Brovana Continue ceftriaxone and azithromycin but she refuses ceftriaxone and will discontinue azithromycin and changed to doxycycline we will continue doxycycline for 2 more days -Check sputum Gram stain, culture and sensitivity. Check strep pneumoniae urinary antigen. -Flutter Valve and Incentive Spirometry  -Respiratory virus panel and COVID testing was negative -SpO2: 98 % O2 Flow Rate (L/min): 3 L/min FiO2 (%): 30 % -Patient will need an Ambulatory home O2 screen prior to discharge and she did desaturate but chest x-ray does show some interval improvement and improvement of the aeration of the left lower base. -Follow-up with PCP and pulmonary in outpatient setting   Generalized Anxiety Disorder -Lorazepam 0.5 mg IV x1. -Resume home Hydroxyzine   Thrombocytopenia (HCC) -Monitor platelet count.  And platelet count went from 122 is now 139 yesterday and today it is now 138 yesterday and today it is improved to 150 -Continue to monitor for signs and symptoms of bleeding; no overt bleeding noted -Repeat CBC in a.m.   Underweight -Protein levels are normal. -Have consulted nutrition and they have discontinued her Ensure or Glucerna shakes in order to boost breeze twice daily as well as boost plus 3 times daily and ordered 1 tablet of multivitamin with minerals daily and changed her diet to regular diet with nectar thick liquids per SLP recommendations will simply take as needed and they are going to complete an NFPE when feasible and this was done and recommendations made  Suspected autoimmune disorder -Follow-up with rheumatology for evaluation of lupus   Grade I Diastolic Dysfunction -No clinical signs of volume overload. -Echo shows grade 1 diastolic dysfunction  still -Does not appear volume overloaded   Hypophosphatemia -Patient's Phos Level was 3.2 at the time of discharge -Continue to Monitor and Replete as Necessary -Repeat Phos Level in the AM    Hyperbilirubinemia -T Bili went from 1.5 -> 1.3 -> 0.9 and repeat was 1.0 today -Continue to Monitor and Trend   Nutrition Documentation    [place] ED to Hosp-Admission (Discharged) from 05/24/2022 in [place]  Nutrition Problem Severe Malnutrition  Etiology chronic illness  Nutrition Goal Patient will meet greater than or equal to 90% of their needs  Interventions Boost Breeze, MVI, Boost Plus      Pain control - [place] Controlled Substance Reporting System database was reviewed. and patient was instructed, not to drive, operate heavy machinery, perform activities at heights, swimming or participation in water activities or provide baby-sitting services while on Pain, Sleep and Anxiety Medications; until their outpatient Physician has advised to do so again. Also recommended to not to take more than prescribed Pain, Sleep and Anxiety Medications.   Consultants: None Procedures performed: None  Disposition: Home Diet recommendation:  Discharge Diet Orders (From admission, onward)     Start     Ordered   05/27/22 0000  Diet - low sodium heart healthy        05/27/22 1600           Cardiac diet DISCHARGE MEDICATION: Allergies as of 05/27/2022  Reactions   Guaifenesin & Derivatives Shortness Of Breath   "Mucinex brand"   Cymbalta [duloxetine Hcl] Other (See Comments)   Unk reaction   Ibuprofen    Upset stomach   Neurontin [gabapentin] Other (See Comments)   Reported by [facility] - slowed heart rate per pt   Pork-derived Products    Patient prefers to not eat any pork/swine   Prozac [fluoxetine Hcl] Other (See Comments)   Loss of appetite, sedation   Tylenol [acetaminophen]    Upset stomach    Ampicillin Hives, Rash   Has patient had a PCN reaction causing immediate rash, facial/tongue/throat swelling, SOB or lightheadedness with hypotension: Yes Has patient had a PCN reaction causing severe rash involving mucus membranes or skin necrosis: No Has patient had a PCN reaction that required hospitalization: Unknown Has patient had a PCN reaction occurring within the last 10 years: Unknown If all of the above answers are "NO", then may proceed with Cephalosporin use.   Clindamycin/lincomycin Swelling, Rash   Have these reactions in feet, ankles and legs.   Penicillins Hives, Rash        Medication List     STOP taking these medications    azithromycin 250 MG tablet Commonly known as: ZITHROMAX   diazepam 2 MG tablet Commonly known as: Valium   methocarbamol 500 MG tablet Commonly known as: ROBAXIN       TAKE these medications    albuterol 108 (90 Base) MCG/ACT inhaler Commonly known as: VENTOLIN HFA Inhale 2 puffs into the lungs 5 (five) times daily as needed for wheezing or shortness of breath.   albuterol (2.5 MG/3ML) 0.083% nebulizer solution Commonly known as: PROVENTIL Take 3 mLs (2.5 mg total) by nebulization every 6 (six) hours as needed for wheezing or shortness of breath.   benzonatate 200 MG capsule Commonly known as: TESSALON Take 1 capsule (200 mg total) by mouth 3 (three) times daily as needed for cough.   doxycycline 100 MG tablet Commonly known as: VIBRA-TABS Take 1 tablet (100 mg total) by mouth every 12 (twelve) hours for 2 days.   hydrOXYzine 25 MG tablet Commonly known as: ATARAX Take 25 mg by mouth 3 (three) times daily as needed for anxiety.   ipratropium-albuterol 0.5-2.5 (3) MG/3ML Soln Commonly known as: DUONEB Take 3 mLs by nebulization every 4 (four) hours as needed.   lactose free nutrition Liqd Take 237 mLs by mouth 3 (three) times daily with meals. What changed: when to take this   methylPREDNISolone 4 MG Tbpk  tablet Commonly known as: MEDROL DOSEPAK Take 6 tablets on day 1, 5 tablets on day 2, 4 tablets on day 3, 3 tablets on day 4, 2 tablets on day 5, 1 tablet on day 6 and then stop on day 7; follow the instructions on box   multivitamin with minerals Tabs tablet Take 1 tablet by mouth daily.        Follow-up Information     [facility] Follow up.   Why: [facility] Contact information: [address] [place] [address] [phone]                Discharge Exam: Filed Weights   05/24/22 0334 05/25/22 0320  Weight: 39.9 kg 37.7 kg   Vitals:   05/27/22 1334 05/27/22 1459  BP: 116/77   Pulse: (!) 102   Resp: 19   Temp: 98.1 F (36.7 C)   SpO2: 100% 99%   Examination: Physical Exam:  Constitutional: Thin cachectic chronically ill-appearing African-American female currently no acute distress appears calm Respiratory: Diminished to auscultation bilaterally with some coarse breath sounds and some minimal wheezing and slight rhonchi., no rales or crackles. Normal respiratory effort and patient is not tachypenic. No accessory muscle use.  Unlabored breathing but is wearing supplemental oxygen via nasal cannula Cardiovascular: RRR, no murmurs / rubs / gallops. S1 and S2 auscultated.  No appreciable extremity edema Abdomen: Soft, non-tender, non-distended. Bowel sounds positive.  GU: Deferred. Musculoskeletal: No clubbing / cyanosis of digits/nails. No joint deformity upper and lower extremities.  Neurologic: CN 2-12 grossly intact with no focal deficits. Romberg sign and cerebellar reflexes not assessed.  Psychiatric: Normal judgment and insight. Alert and oriented x 3. Normal mood and appropriate affect.   Condition at discharge: stable  The results of significant diagnostics from this hospitalization (including imaging, microbiology, ancillary and laboratory) are listed below for reference.   Imaging Studies: DG Swallowing Func-Speech Pathology  Result  Date: 05/27/2022 Table formatting from the original result was not included. Objective Swallowing Evaluation: Type of Study: MBS-Modified Barium Swallow Study  Patient Details Name: Esmie Romrell MRN: [id number] Date of Birth: 1966-06-10 Today's Date: 05/27/2022 Time: SLP Start Time (ACUTE ONLY): 1245 -SLP Stop Time (ACUTE ONLY): 1300 SLP Time Calculation (min) (ACUTE ONLY): 15 min Past Medical History: Past Medical History: Diagnosis Date  Anxiety   Chronic back pain   COPD, mild (HCC)   Emphysema lung (HCC)   Endometriosis   Fibromyalgia   Grade I diastolic dysfunction [id number]  Kidney stones   Neuropathic pain   PTSD (post-traumatic stress disorder)  Past Surgical History: Past Surgical History: Procedure Laterality Date  CESAREAN SECTION    copolscopy    TONSILLECTOMY    TUBAL LIGATION   HPI: Patient is a 56 y.o. female with PMH: PTSD, chronic back pain, COPD/emphysema, endometriosis, fibromyalgia, neuropathic pain, kidney stone, who was brought to the ED on 05/24/22 via EMS due to acute dyspnea with wheezing since previous evening. She was admitted to the hospital last month with a similar complaint. She was placed on BiPAP, requested something for anxiety due to BiPAP making her feel claustrophobic. CXR showed PNA versus atelectasis in the infrahilar left lower lobe. CTA chest showing no PE but positive for emphysema and patchy interstitial thickening greatest in RUL being questionable for PNA. Patient c/o difficulty swallowing.  Subjective: pleasant, understanding of test reasoning  Recommendations for follow up therapy are one component of a multi-disciplinary discharge planning process, led by the attending physician.  Recommendations may be updated based on patient status, additional functional criteria and insurance authorization. Assessment / Plan / Recommendation   05/27/2022   1:44 PM Clinical Impressions Clinical Impression Patient presenting with an oropharyngeal swallow that is WFL-WNL as per this  Modified Barium Swallow study. She briefly held liquids in her mouth before swallowing which appeared to be a conscious action. No swallow initiation delays observed with thin liquid barium or 1/4 of 12mm barium tablet and only trace vallecular residuals with thin liquids which cleared completely. No incidents of aspiration or penetration, UES opening appeared WNL and esophageal sweep did not reveal anything concerning. SLP is recommending continue with regular solids, thin liquids and no f/u needed at this time. SLP Visit Diagnosis Dysphagia, oropharyngeal phase (R13.12) Impact on safety and function No limitations     05/27/2022   1:44 PM Treatment Recommendations Treatment Recommendations No treatment recommended at this time     05/27/2022   1:48 PM Prognosis  Prognosis for Safe Diet Advancement Good   05/27/2022   1:44 PM Diet Recommendations SLP Diet Recommendations Regular solids;Thin liquid Liquid Administration via Cup;Straw Medication Administration Whole meds with liquid Compensations Slow rate;Small sips/bites Postural Changes Seated upright at 90 degrees     05/27/2022   1:44 PM Other Recommendations Follow Up Recommendations No SLP follow up Assistance recommended at discharge None Functional Status Assessment Patient has had a recent decline in their functional status and demonstrates the ability to make significant improvements in function in a reasonable and predictable amount of time.   05/25/2022   5:56 PM Frequency and Duration  Speech Therapy Frequency (ACUTE ONLY) min 2x/week Treatment Duration 1 week     05/27/2022   1:44 PM Oral Phase Oral Phase [facility]    05/27/2022   1:44 PM Pharyngeal Phase Pharyngeal Phase [facility]    05/27/2022   1:44 PM Cervical Esophageal Phase  Cervical Esophageal Phase WFL Sonia Baller, MA, CCC-SLP Speech Therapy                     DG CHEST PORT 1 VIEW  Result Date: 05/27/2022 CLINICAL DATA:  Dyspnea EXAM: PORTABLE CHEST 1 VIEW COMPARISON:  Portable exam 0559 hours compared to  05/26/2022 FINDINGS: Normal heart size, mediastinal contours, and pulmonary vascularity. Atherosclerotic calcification aorta. Improved aeration LEFT lower lobe. Remaining lungs clear. No pleural effusion or pneumothorax. IMPRESSION: Improved aeration LEFT lower lobe. Aortic Atherosclerosis (ICD10-I70.0). Electronically Signed   By: Lavonia Dana M.D.   On: 05/27/2022 08:08   DG CHEST PORT 1 VIEW  Result Date: 05/26/2022 CLINICAL DATA:  Shortness of breath. EXAM: PORTABLE CHEST 1 VIEW COMPARISON:  05/25/2022 FINDINGS: Lungs are adequately inflated without focal airspace consolidation or effusion. Cardiomediastinal silhouette and remainder of the exam is unchanged. IMPRESSION: No active disease. Electronically Signed   By: Marin Olp M.D.   On: 05/26/2022 08:25   DG CHEST PORT 1 VIEW  Result Date: 05/25/2022 CLINICAL DATA:  Cough today with concern for pneumonia. EXAM: PORTABLE CHEST 1 VIEW COMPARISON:  05/24/2022 FINDINGS: Lungs are adequately inflated without focal airspace consolidation or effusion. Cardiomediastinal silhouette and remainder of the exam is unchanged. IMPRESSION: No active disease. Electronically Signed   By: Marin Olp M.D.   On: 05/25/2022 09:06   ECHOCARDIOGRAM COMPLETE  Result Date: 05/24/2022    ECHOCARDIOGRAM REPORT   Patient Name:   DELAYNA UPDYKE Date of Exam: 05/24/2022 Medical Rec #:  [id number]     Height:       62.0 in Accession #:    [id number]    Weight:       88.0 lb Date of Birth:  1966/02/12      BSA:          1.349 m Patient Age:    88 years      BP:           130/19 mmHg Patient Gender: F             HR:           119 bpm. Exam Location:  Inpatient Procedure: 2D Echo, Cardiac Doppler and Color Doppler Indications:    Dyspnea R06.00  History:        Patient has prior history of Echocardiogram examinations, most                 recent 04/07/2018. COPD, Arrythmias:Tachycardia; Risk  Factors:Former Smoker. Acute respiratory failure with hypoxia                  and hypercapnia.  Sonographer:    Leta Jungling RDCS Referring Phys: [id number] DAVID MANUEL ORTIZ  Sonographer Comments: Image acquisition challenging due to respiratory motion and BiPaP. IMPRESSIONS  1. Old study not available to review.  2. Left ventricular ejection fraction, by estimation, is 60 to 65%. The left ventricle has normal function. The left ventricle has no regional wall motion abnormalities. Left ventricular diastolic parameters are consistent with Grade I diastolic dysfunction (impaired relaxation).  3. Right ventricular systolic function is normal. The right ventricular size is normal.  4. The mitral valve is abnormal. Trivial mitral valve regurgitation. No evidence of mitral stenosis.  5. AV not well seen due to respiratory /lung interference Calcified and on PSL images bows like it may be bicuspid . The aortic valve was not well visualized. There is severe calcifcation of the aortic valve. There is severe thickening of the aortic valve. Aortic valve regurgitation is moderate. Moderate aortic valve stenosis.  6. The inferior vena cava is normal in size with greater than 50% respiratory variability, suggesting right atrial pressure of 3 mmHg. FINDINGS  Left Ventricle: Left ventricular ejection fraction, by estimation, is 60 to 65%. The left ventricle has normal function. The left ventricle has no regional wall motion abnormalities. The left ventricular internal cavity size was normal in size. There is  no left ventricular hypertrophy. Left ventricular diastolic parameters are consistent with Grade I diastolic dysfunction (impaired relaxation). Right Ventricle: The right ventricular size is normal. No increase in right ventricular wall thickness. Right ventricular systolic function is normal. Left Atrium: Left atrial size was normal in size. Right Atrium: Right atrial size was normal in size. Pericardium: There is no evidence of pericardial effusion. Mitral Valve: The mitral valve is abnormal. There  is mild thickening of the mitral valve leaflet(s). There is mild calcification of the mitral valve leaflet(s). Trivial mitral valve regurgitation. No evidence of mitral valve stenosis. Tricuspid Valve: The tricuspid valve is normal in structure. Tricuspid valve regurgitation is not demonstrated. No evidence of tricuspid stenosis. Aortic Valve: AV not well seen due to respiratory /lung interference Calcified and on PSL images bows like it may be bicuspid. The aortic valve was not well visualized. There is severe calcifcation of the aortic valve. There is severe thickening of the aortic valve. Aortic valve regurgitation is moderate. Aortic regurgitation PHT measures 472 msec. Moderate aortic stenosis is present. Aortic valve mean gradient measures 18.0 mmHg. Aortic valve peak gradient measures 29.1 mmHg. Aortic valve area, by VTI  measures 0.73 cm. Pulmonic Valve: The pulmonic valve was normal in structure. Pulmonic valve regurgitation is not visualized. No evidence of pulmonic stenosis. Aorta: The aortic root is normal in size and structure. Venous: The inferior vena cava is normal in size with greater than 50% respiratory variability, suggesting right atrial pressure of 3 mmHg. IAS/Shunts: No atrial level shunt detected by color flow Doppler. Additional Comments: Old study not available to review.  LEFT VENTRICLE PLAX 2D LVIDd:         4.00 cm LVIDs:         2.60 cm LV PW:         0.70 cm LV IVS:        0.60 cm LVOT diam:     1.80 cm LV SV:         34 LV SV Index:   25  LVOT Area:     2.54 cm  RIGHT VENTRICLE RV S prime:     14.50 cm/s TAPSE (M-mode): 1.8 cm LEFT ATRIUM             Index        RIGHT ATRIUM          Index LA diam:        2.40 cm 1.78 cm/m   RA Area:     8.00 cm LA Vol (A2C):   16.4 ml 12.16 ml/m  RA Volume:   15.70 ml 11.64 ml/m LA Vol (A4C):   19.8 ml 14.68 ml/m LA Biplane Vol: 19.1 ml 14.16 ml/m  AORTIC VALVE AV Area (Vmax):    1.04 cm AV Area (Vmean):   0.82 cm AV Area (VTI):     0.73  cm AV Vmax:           269.50 cm/s AV Vmean:          200.000 cm/s AV VTI:            0.458 m AV Peak Grad:      29.1 mmHg AV Mean Grad:      18.0 mmHg LVOT Vmax:         110.00 cm/s LVOT Vmean:        64.600 cm/s LVOT VTI:          0.132 m LVOT/AV VTI ratio: 0.29 AI PHT:            472 msec  AORTA Ao Root diam: 2.70 cm  SHUNTS Systemic VTI:  0.13 m Systemic Diam: 1.80 cm Charlton Haws MD Electronically signed by Charlton Haws MD Signature Date/Time: 05/24/2022/2:22:53 PM    Final    CT Angio Chest PE W and/or Wo Contrast  Result Date: 05/24/2022 CLINICAL DATA:  Chest pain or SOB, pleurisy or effusion suspected EXAM: CT ANGIOGRAPHY CHEST WITH CONTRAST TECHNIQUE: Multidetector CT imaging of the chest was performed using the standard protocol during bolus administration of intravenous contrast. Multiplanar CT image reconstructions and MIPs were obtained to evaluate the vascular anatomy. RADIATION DOSE REDUCTION: This exam was performed according to the departmental dose-optimization program which includes automated exposure control, adjustment of the mA and/or kV according to patient size and/or use of iterative reconstruction technique. CONTRAST:  28mL OMNIPAQUE IOHEXOL 350 MG/ML SOLN COMPARISON:  None Available. FINDINGS: Cardiovascular: Satisfactory opacification of the pulmonary arteries to the segmental level. No evidence of pulmonary embolism. Normal heart size. No pericardial effusion. Mediastinum/Nodes: No enlarged nodes. Esophagus is unremarkable. Thyroid is poorly evaluated due to streak artifact. Lungs/Pleura: Emphysema. Patchy interstitial thickening, greatest in the right upper lobe. No pleural effusion or pneumothorax. Upper Abdomen: No acute abnormality. Mixed plaque along the abdominal aorta. Musculoskeletal: No acute osseous abnormality. Review of the MIP images confirms the above findings. IMPRESSION: No evidence of acute pulmonary embolism. Emphysema. Patchy interstitial thickening, greatest in the  right upper lobe. This may be chronic or reflect mild infectious/inflammatory changes. Electronically Signed   By: Guadlupe Spanish M.D.   On: 05/24/2022 08:27   DG Chest Port 1 View  Result Date: 05/24/2022 CLINICAL DATA:  Shortness of breath and agitation. EXAM: PORTABLE CHEST 1 VIEW COMPARISON:  Portable chest 04/20/2022. FINDINGS: Patient is rotated to the left somewhat limiting the exam. There is a low inspiration compared to the prior study. There is interval increased opacity in the infrahilar left lower lobe concerning for pneumonia or aspiration, alternatively could be simple atelectasis from low lung volumes.  No significant pleural collection is seen. The remaining lungs clear with COPD change. The cardiac size is normal. Aortic atherosclerosis and mediastinal configuration are unchanged. No acute osseous findings. IMPRESSION: Pneumonia versus atelectasis in the infrahilar left lower lobe. Follow-up study recommended in full inspiration. COPD. Electronically Signed   By: Telford Nab M.D.   On: 05/24/2022 05:03    Microbiology: Results for orders placed or performed during the hospital encounter of 05/24/22  SARS Coronavirus 2 by RT PCR (hospital order, performed in [facility] hospital lab) *cepheid single result test* Anterior Nasal Swab     Status: None   Collection Time: 05/24/22  9:42 AM   Specimen: Anterior Nasal Swab  Result Value Ref Range Status   SARS Coronavirus 2 by RT PCR NEGATIVE NEGATIVE Final    Comment: (NOTE) SARS-CoV-2 target nucleic acids are NOT DETECTED.  The SARS-CoV-2 RNA is generally detectable in upper and lower respiratory specimens during the acute phase of infection. The lowest concentration of SARS-CoV-2 viral copies this assay can detect is 250 copies / mL. A negative result does not preclude SARS-CoV-2 infection and should not be used as the sole basis for treatment or other patient management decisions.  A negative result may occur with improper  specimen collection / handling, submission of specimen other than nasopharyngeal swab, presence of viral mutation(s) within the areas targeted by this assay, and inadequate number of viral copies (<250 copies / mL). A negative result must be combined with clinical observations, patient history, and epidemiological information.  Fact Sheet for Patients:   https://www.patel.info/  Fact Sheet for Healthcare Providers: https://hall.com/  This test is not yet approved or  cleared by the [place] FDA and has been authorized for detection and/or diagnosis of SARS-CoV-2 by FDA under an Emergency Use Authorization (EUA).  This EUA will remain in effect (meaning this test can be used) for the duration of the COVID-19 declaration under Section 564(b)(1) of the Act, 21 U.S.C. section 360bbb-3(b)(1), unless the authorization is terminated or revoked sooner.  Performed at [facility], [place] [address] [place], [place] [address]   Respiratory (~20 pathogens) panel by PCR     Status: None   Collection Time: 05/24/22 11:10 AM   Specimen: Anterior Nasal Swab; Respiratory  Result Value Ref Range Status   Adenovirus NOT DETECTED NOT DETECTED Final   Coronavirus 229E NOT DETECTED NOT DETECTED Final    Comment: (NOTE) The Coronavirus on the Respiratory Panel, DOES NOT test for the novel  Coronavirus (2019 nCoV)    Coronavirus HKU1 NOT DETECTED NOT DETECTED Final   Coronavirus NL63 NOT DETECTED NOT DETECTED Final   Coronavirus OC43 NOT DETECTED NOT DETECTED Final   Metapneumovirus NOT DETECTED NOT DETECTED Final   Rhinovirus / Enterovirus NOT DETECTED NOT DETECTED Final   Influenza A NOT DETECTED NOT DETECTED Final   Influenza B NOT DETECTED NOT DETECTED Final   Parainfluenza Virus 1 NOT DETECTED NOT DETECTED Final   Parainfluenza Virus 2 NOT DETECTED NOT DETECTED Final   Parainfluenza Virus 3 NOT DETECTED NOT DETECTED Final    Parainfluenza Virus 4 NOT DETECTED NOT DETECTED Final   Respiratory Syncytial Virus NOT DETECTED NOT DETECTED Final   Bordetella pertussis NOT DETECTED NOT DETECTED Final   Bordetella Parapertussis NOT DETECTED NOT DETECTED Final   Chlamydophila pneumoniae NOT DETECTED NOT DETECTED Final   Mycoplasma pneumoniae NOT DETECTED NOT DETECTED Final    Comment: Performed at [facility], [place]. [address] [place], [place]  [place]: CBC: Recent Labs  Lab 05/24/22 0750 05/25/22 0512 05/26/22 0530 05/27/22 0414  WBC 10.9* 10.2 8.8 9.1  NEUTROABS 10.5* 7.9* 6.3 6.9  HGB 12.2 12.9 12.5 12.2  HCT 39.5 41.6 39.2 39.2  MCV 101.0* 100.7* 100.0 101.0*  PLT 122* 139* 138* [id number]   Basic Metabolic Panel: Recent Labs  Lab 05/24/22 0409 05/24/22 0606 05/25/22 0819 05/26/22 0530 05/27/22 0414  NA 138  --  141 141 144  K 4.2  --  4.3 4.6 4.5  CL 100  --  105 106 106  CO2 30  --  31 30 34*  GLUCOSE 140*  --  110* 80 105*  BUN 10  --  16 16 26*  CREATININE 0.49  --  0.64 0.56 0.71  CALCIUM 9.0  --  9.7 9.6 9.7  MG  --   --  2.2 2.3 2.3  PHOS  --  2.8 2.3* 3.1 3.2   Liver Function Tests: Recent Labs  Lab 05/24/22 0606 05/25/22 0819 05/26/22 0530 05/27/22 0414  AST 18 17 13* 13*  ALT 13 12 11 12   ALKPHOS 56 50 46 43  BILITOT 1.5* 1.3* 0.9 1.0  PROT 7.4 6.7 6.3* 6.2*  ALBUMIN 4.2 3.7 3.4* 3.4*   CBG: No results for input(s): "GLUCAP" in the last 168 hours.  Discharge time spent: greater than 30 minutes.  Signed: Raiford Noble, DO [facility] 05/28/2022

## 2022-07-16 ENCOUNTER — Encounter: Payer: Self-pay | Admitting: Pulmonary Disease

## 2022-07-16 ENCOUNTER — Ambulatory Visit (INDEPENDENT_AMBULATORY_CARE_PROVIDER_SITE_OTHER): Payer: No Typology Code available for payment source | Admitting: Pulmonary Disease

## 2022-07-16 VITALS — BP 128/70 | HR 76 | Ht 62.0 in | Wt 88.6 lb

## 2022-07-16 DIAGNOSIS — J432 Centrilobular emphysema: Secondary | ICD-10-CM | POA: Diagnosis not present

## 2022-07-16 DIAGNOSIS — G4734 Idiopathic sleep related nonobstructive alveolar hypoventilation: Secondary | ICD-10-CM | POA: Diagnosis not present

## 2022-07-16 MED ORDER — IPRATROPIUM-ALBUTEROL 0.5-2.5 (3) MG/3ML IN SOLN: 3.0000 mL | RESPIRATORY_TRACT | 1 refills | Status: DC | PRN

## 2022-07-16 NOTE — Progress Notes (Unsigned)
Synopsis: Referred in August 2023 for COPD by Clare Gandy, MD  Subjective:   PATIENT ID: Michele Lucas GENDER: female DOB: 06-28-1966, MRN: [id number]   HPI  Chief Complaint  Patient presents with   Consult    Referred by [facility] for COPD. States she was diagnosed with COPD years ago.    Chiyeko Ferre is a 56 year old woman, former smoker who is referred to pulmonary clinic for COPD.   Had left pneumothorax s/p chest tube 5 years ago Admitted in June, initially required bipap  She is on advair 500-50 Spiriva 1.68mcg  She reports a couple episodes of acute dyspnea since her discharge She is winded with 5 steps into her apartment building If she rushes to do things, her anxiety kicks in, her dyspnea is worse  Smoking half a pack per day   Past Medical History:  Diagnosis Date   Anxiety    Chronic back pain    COPD, mild (HCC)    Emphysema lung (HCC)    Endometriosis    Fibromyalgia    Grade I diastolic dysfunction 11/26/2021   Kidney stones    Neuropathic pain    PTSD (post-traumatic stress disorder)      Family History  Problem Relation Age of Onset   Diabetes type II Other    Stroke Neg Hx      Social History   Socioeconomic History   Marital status: Married    Spouse name: Not on file   Number of children: Not on file   Years of education: Not on file   Highest education level: Not on file  Occupational History   Not on file  Tobacco Use   Smoking status: Former    Packs/day: 0.50    Types: Cigarettes    Quit date: 11/25/2021    Years since quitting: 0.6   Smokeless tobacco: Current   Tobacco comments:    Previously documented in a pack per day-she now reports 4 to 5 cigarettes/day  Vaping Use   Vaping Use: Never used  Substance and Sexual Activity   Alcohol use: No   Drug use: No   Sexual activity: Yes    Birth control/protection: Surgical  Other Topics Concern   Not on file  Social History Narrative   Not on file   Social Determinants  of Health   Financial Resource Strain: Not on file  Food Insecurity: Not on file  Transportation Needs: Not on file  Physical Activity: Not on file  Stress: Not on file  Social Connections: Not on file  Intimate Partner Violence: Not on file     Allergies  Allergen Reactions   Guaifenesin & Derivatives Shortness Of Breath    "Mucinex brand"   Cymbalta [Duloxetine Hcl] Other (See Comments)    Unk reaction   Ibuprofen     Upset stomach   Neurontin [Gabapentin] Other (See Comments)    Reported by [facility] - slowed heart rate per pt   Pork-Derived Products     Patient prefers to not eat any pork/swine    Prozac [Fluoxetine Hcl] Other (See Comments)    Loss of appetite, sedation   Tylenol [Acetaminophen]     Upset stomach   Ampicillin Hives and Rash    Has patient had a PCN reaction causing immediate rash, facial/tongue/throat swelling, SOB or lightheadedness with hypotension: Yes Has patient had a PCN reaction causing severe rash involving mucus membranes or skin necrosis: No Has patient had a PCN reaction  that required hospitalization: Unknown Has patient had a PCN reaction occurring within the last 10 years: Unknown If all of the above answers are "NO", then may proceed with Cephalosporin use.   Clindamycin/Lincomycin Swelling and Rash    Have these reactions in feet, ankles and legs.   Penicillins Hives and Rash     Outpatient Medications Prior to Visit  Medication Sig Dispense Refill   albuterol (PROVENTIL HFA;VENTOLIN HFA) 108 (90 Base) MCG/ACT inhaler Inhale 2 puffs into the lungs 5 (five) times daily as needed for wheezing or shortness of breath.      albuterol (PROVENTIL) (2.5 MG/3ML) 0.083% nebulizer solution Take 3 mLs (2.5 mg total) by nebulization every 6 (six) hours as needed for wheezing or shortness of breath. 75 mL 12   benzonatate (TESSALON) 200 MG capsule Take 1 capsule (200 mg total) by mouth 3 (three) times daily as needed for cough. 20  capsule 0   fluticasone-salmeterol (WIXELA INHUB) 500-50 MCG/ACT AEPB Inhale 1 puff into the lungs in the morning and at bedtime.     ipratropium-albuterol (DUONEB) 0.5-2.5 (3) MG/3ML SOLN Take 3 mLs by nebulization every 4 (four) hours as needed. 360 mL 1   lactose free nutrition (BOOST PLUS) LIQD Take 237 mLs by mouth 3 (three) times daily with meals. 2370 mL 0   methocarbamol (ROBAXIN) 500 MG tablet Take 500 mg by mouth daily as needed for muscle spasms.     Multiple Vitamin (MULTIVITAMIN WITH MINERALS) TABS tablet Take 1 tablet by mouth daily. 30 tablet 0   oxyCODONE (OXY IR/ROXICODONE) 5 MG immediate release tablet Take 5 mg by mouth every 4 (four) hours as needed for severe pain.     Tiotropium Bromide Monohydrate (SPIRIVA RESPIMAT) 1.25 MCG/ACT AERS Inhale 2 each into the lungs daily.     hydrOXYzine (ATARAX) 25 MG tablet Take 25 mg by mouth 3 (three) times daily as needed for anxiety.     methylPREDNISolone (MEDROL DOSEPAK) 4 MG TBPK tablet Take 6 tablets on day 1, 5 tablets on day 2, 4 tablets on day 3, 3 tablets on day 4, 2 tablets on day 5, 1 tablet on day 6 and then stop on day 7; follow the instructions on box 21 tablet 0   No facility-administered medications prior to visit.   Review of Systems  Constitutional:  Negative for chills, fever, malaise/fatigue and weight loss.  HENT:  Negative for congestion, sinus pain and sore throat.   Eyes: Negative.   Respiratory:  Negative for cough, hemoptysis, sputum production, shortness of breath and wheezing.   Cardiovascular:  Negative for chest pain, palpitations, orthopnea, claudication and leg swelling.  Gastrointestinal:  Negative for abdominal pain, heartburn, nausea and vomiting.  Genitourinary: Negative.   Musculoskeletal:  Negative for joint pain and myalgias.  Skin:  Negative for rash.  Neurological:  Negative for weakness.  Endo/Heme/Allergies: Negative.   Psychiatric/Behavioral: Negative.     Objective:   Vitals:    07/16/22 1536  BP: 128/70  Pulse: 76  SpO2: 95%  Weight: 88 lb 9.6 oz (40.2 kg)  Height: 5\' 2"  (1.575 m)   Physical Exam Constitutional:      General: She is not in acute distress.    Appearance: She is not ill-appearing.  HENT:     Head: Normocephalic and atraumatic.  Eyes:     General: No scleral icterus.    Conjunctiva/sclera: Conjunctivae normal.     Pupils: Pupils are equal, round, and reactive to light.  Cardiovascular:  Rate and Rhythm: Normal rate and regular rhythm.     Pulses: Normal pulses.     Heart sounds: Normal heart sounds. No murmur heard. Pulmonary:     Effort: Pulmonary effort is normal.     Breath sounds: Normal breath sounds. No wheezing, rhonchi or rales.  Abdominal:     General: Bowel sounds are normal.     Palpations: Abdomen is soft.  Musculoskeletal:     Right lower leg: No edema.     Left lower leg: No edema.  Lymphadenopathy:     Cervical: No cervical adenopathy.  Skin:    General: Skin is warm and dry.  Neurological:     General: No focal deficit present.     Mental Status: She is alert.  Psychiatric:        Mood and Affect: Mood normal.        Behavior: Behavior normal.        Thought Content: Thought content normal.        Judgment: Judgment normal.    CBC    Component Value Date/Time   WBC 9.1 05/27/2022 0414   RBC 3.88 05/27/2022 0414   HGB 12.2 05/27/2022 0414   HCT 39.2 05/27/2022 0414   PLT 150 05/27/2022 0414   MCV 101.0 (H) 05/27/2022 0414   MCH 31.4 05/27/2022 0414   MCHC 31.1 05/27/2022 0414   RDW 11.9 05/27/2022 0414   LYMPHSABS 1.3 05/27/2022 0414   MONOABS 0.9 05/27/2022 0414   EOSABS 0.0 05/27/2022 0414   BASOSABS 0.0 05/27/2022 0414      Latest Ref Rng & Units 05/27/2022    4:14 AM 05/26/2022    5:30 AM 05/25/2022    8:19 AM  BMP  Glucose 70 - 99 mg/dL 105  80  110   BUN 6 - 20 mg/dL 26  16  16    Creatinine 0.44 - 1.00 mg/dL 0.71  0.56  0.64   Sodium 135 - 145 mmol/L 144  141  141   Potassium 3.5 - 5.1  mmol/L 4.5  4.6  4.3   Chloride 98 - 111 mmol/L 106  106  105   CO2 22 - 32 mmol/L 34  30  31   Calcium 8.9 - 10.3 mg/dL 9.7  9.6  9.7    Chest imaging: CXR 05/27/22 Improved aeration of left lung, aortic atherosclerosis  PFT:     No data to display         Labs:  Path:  Echo 05/24/2022: LV EF 60-65%. Grade I diastolic dysfunction. RV systolic function is normal. RV size is normal.   Heart Catheterization:     Assessment & Plan:   Centrilobular emphysema ([place]) - Plan: Pulmonary Function Test, Pulse oximetry, overnight  Nocturnal hypoxemia  Discussion: ***    Current Outpatient Medications:    albuterol (PROVENTIL HFA;VENTOLIN HFA) 108 (90 Base) MCG/ACT inhaler, Inhale 2 puffs into the lungs 5 (five) times daily as needed for wheezing or shortness of breath. , Disp: , Rfl:    albuterol (PROVENTIL) (2.5 MG/3ML) 0.083% nebulizer solution, Take 3 mLs (2.5 mg total) by nebulization every 6 (six) hours as needed for wheezing or shortness of breath., Disp: 75 mL, Rfl: 12   benzonatate (TESSALON) 200 MG capsule, Take 1 capsule (200 mg total) by mouth 3 (three) times daily as needed for cough., Disp: 20 capsule, Rfl: 0   fluticasone-salmeterol (WIXELA INHUB) 500-50 MCG/ACT AEPB, Inhale 1 puff into the lungs in the morning and at bedtime., Disp: ,  Rfl:    ipratropium-albuterol (DUONEB) 0.5-2.5 (3) MG/3ML SOLN, Take 3 mLs by nebulization every 4 (four) hours as needed., Disp: 360 mL, Rfl: 1   lactose free nutrition (BOOST PLUS) LIQD, Take 237 mLs by mouth 3 (three) times daily with meals., Disp: 2370 mL, Rfl: 0   methocarbamol (ROBAXIN) 500 MG tablet, Take 500 mg by mouth daily as needed for muscle spasms., Disp: , Rfl:    Multiple Vitamin (MULTIVITAMIN WITH MINERALS) TABS tablet, Take 1 tablet by mouth daily., Disp: 30 tablet, Rfl: 0   oxyCODONE (OXY IR/ROXICODONE) 5 MG immediate release tablet, Take 5 mg by mouth every 4 (four) hours as needed for severe pain., Disp: , Rfl:     Tiotropium Bromide Monohydrate (SPIRIVA RESPIMAT) 1.25 MCG/ACT AERS, Inhale 2 each into the lungs daily., Disp: , Rfl:

## 2022-07-16 NOTE — Patient Instructions (Addendum)
We will order you a nebulizer machine and supplies with duoneb nebulizer solution to use as needed  Continue on advair 1 puff twice daily and spiriva 2 puffs daily  Recommend using your nicotine lozenges to help cut down on your cigarette use.  We will check overnight oximetry test on room air  Please have your 6 minute walk test results faxed to [place] from the [place] after it is completed.   Follow up in 2 months with pulmonary function tests

## 2022-07-17 ENCOUNTER — Encounter: Payer: Self-pay | Admitting: Pulmonary Disease

## 2022-08-19 ENCOUNTER — Telehealth: Payer: Self-pay | Admitting: Pulmonary Disease

## 2022-08-19 NOTE — Telephone Encounter (Signed)
Dr. Francine Graven, please advise if you are okay with [place] discontinuing pt's O2.

## 2022-08-19 NOTE — Telephone Encounter (Signed)
Patient needs [place] to send discharge form to [facility] so they can pick up the oxygen equipment. The patient does not need it.If you call the patient's phone and does not get an answer call this Number: Michele Lucas- [phone] (the equipment is at her house)  FAX #: [phone]

## 2022-08-19 NOTE — Telephone Encounter (Signed)
We need the 6 minute walk test results from the [place] and her overnight oximitry test results I ordered from the recent office visit before cancelling her O2 orders.  From the last office note I have that she is using 4L at night.  Thanks, JD

## 2022-08-21 NOTE — Telephone Encounter (Signed)
Sent fax to [facility] to get copy of ONO and 6 minute walk for JD to look at and determine oxygen needs. Nothing further needed

## 2022-08-27 ENCOUNTER — Encounter

## 2022-08-28 NOTE — Telephone Encounter (Signed)
Patient is call regarding the release form that should have been faxed in order for [employer] to pick up the equipment that she still has and is still being charged for.  Patient stated she sent a message on 9/11.  Please advise and call patient to discuss further at [phone]

## 2022-09-02 IMAGING — DX DG CHEST 1V PORT
1 series · 1 of 1 positions shown · non-contrast
Comparison: 05/25/2022

CLINICAL DATA: Shortness of breath.

EXAM:
PORTABLE CHEST 1 VIEW

[chest ap]
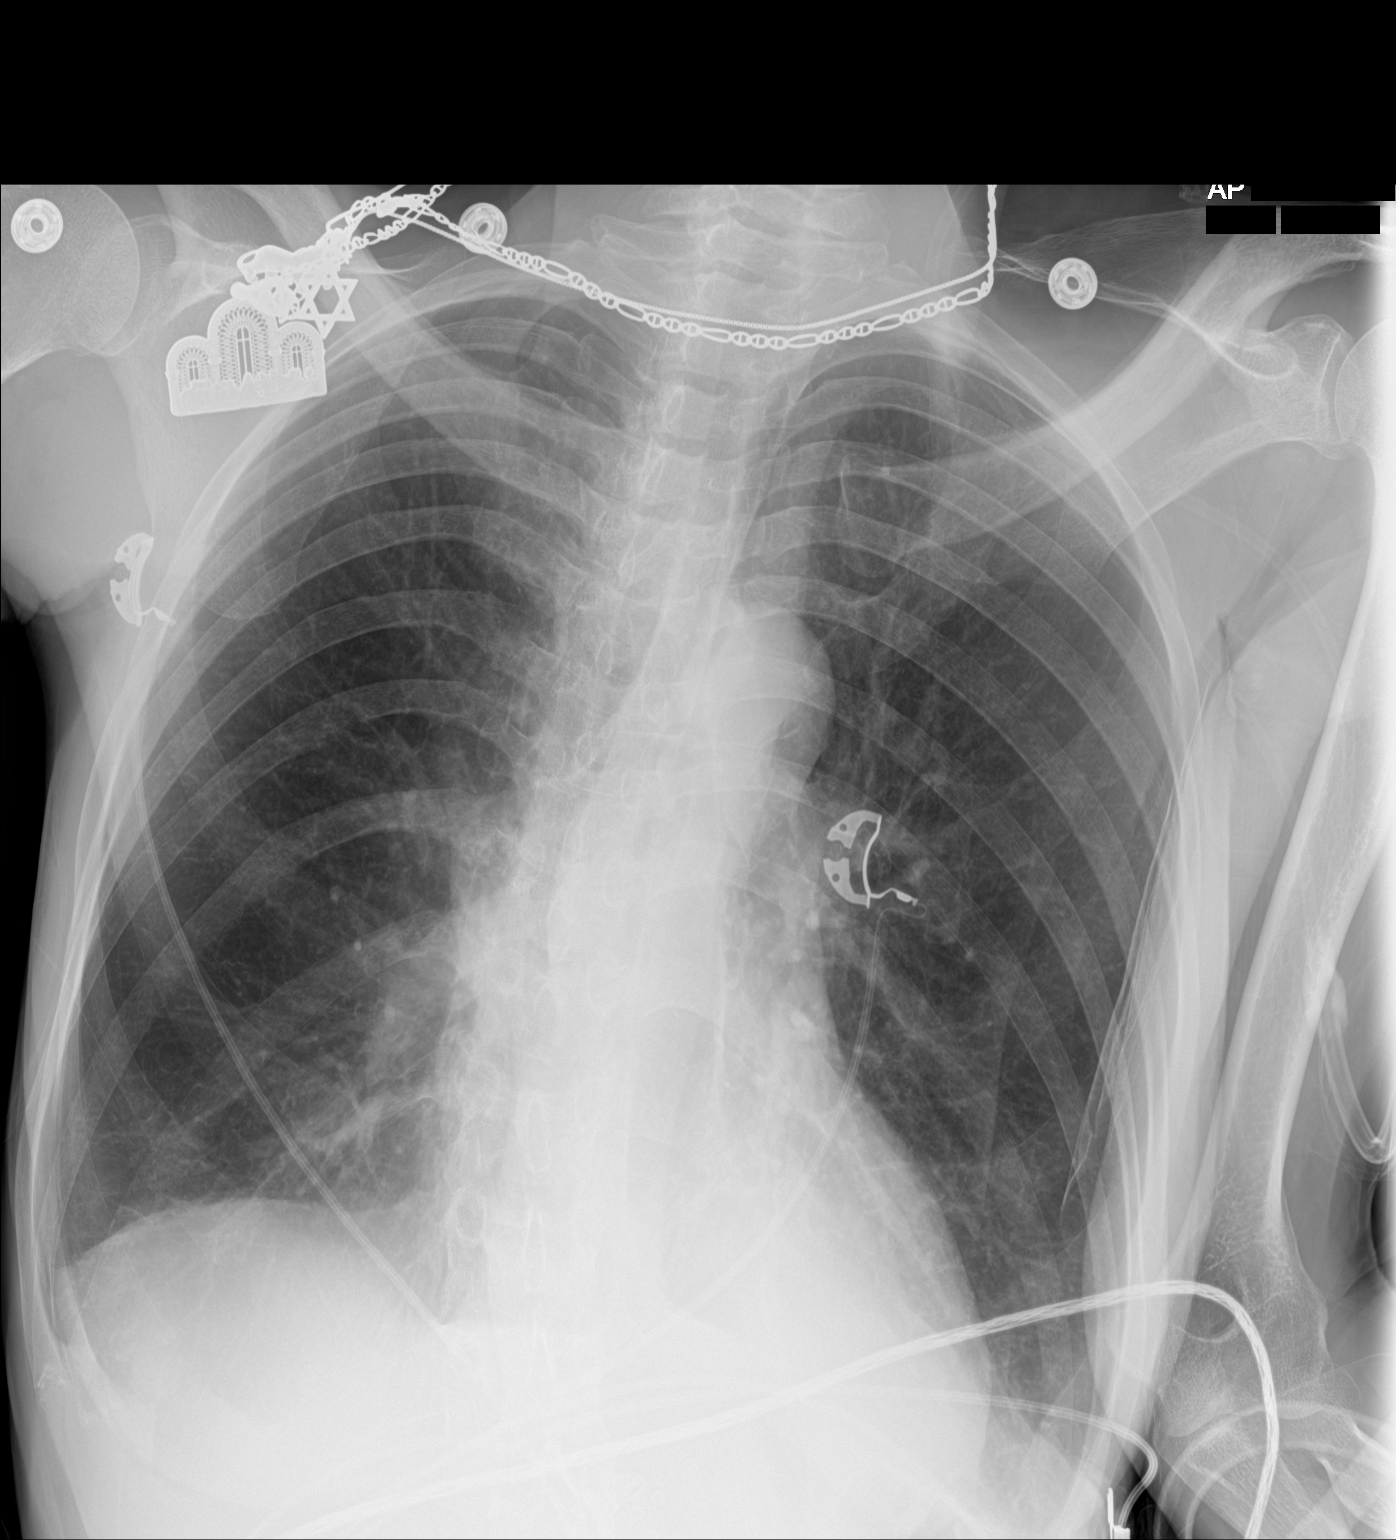

[1 of 1 positions shown; findings below may reference images not displayed]

FINDINGS: Lungs are adequately inflated without focal airspace consolidation
or effusion. Cardiomediastinal silhouette and remainder of the exam
is unchanged.
IMPRESSION: No active disease.

## 2022-09-03 IMAGING — DX DG CHEST 1V PORT
2 series · 2 of 2 positions shown · non-contrast
Comparison: Portable exam 8888 hours compared to 05/26/2022

CLINICAL DATA: Dyspnea

EXAM:
PORTABLE CHEST 1 VIEW

[chest ap (1 of 2)]
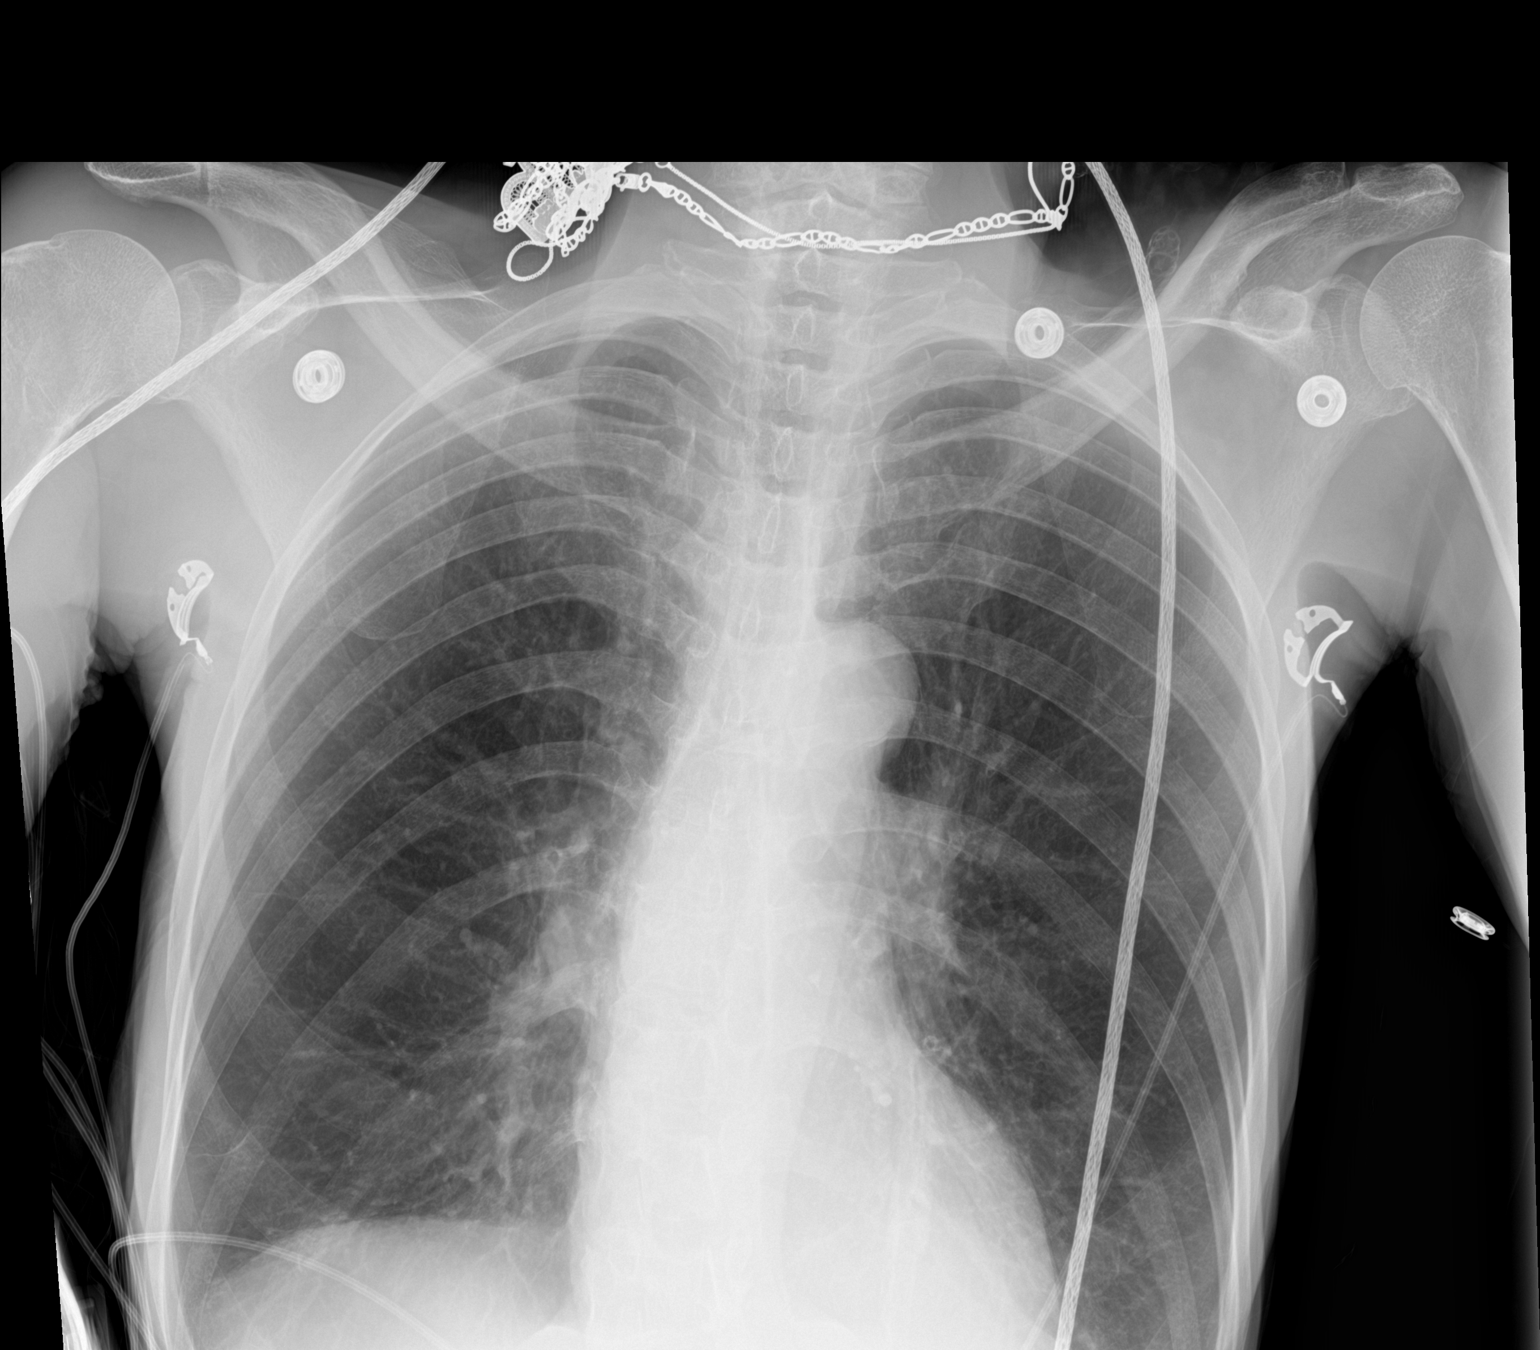

[chest ap (2 of 2)]
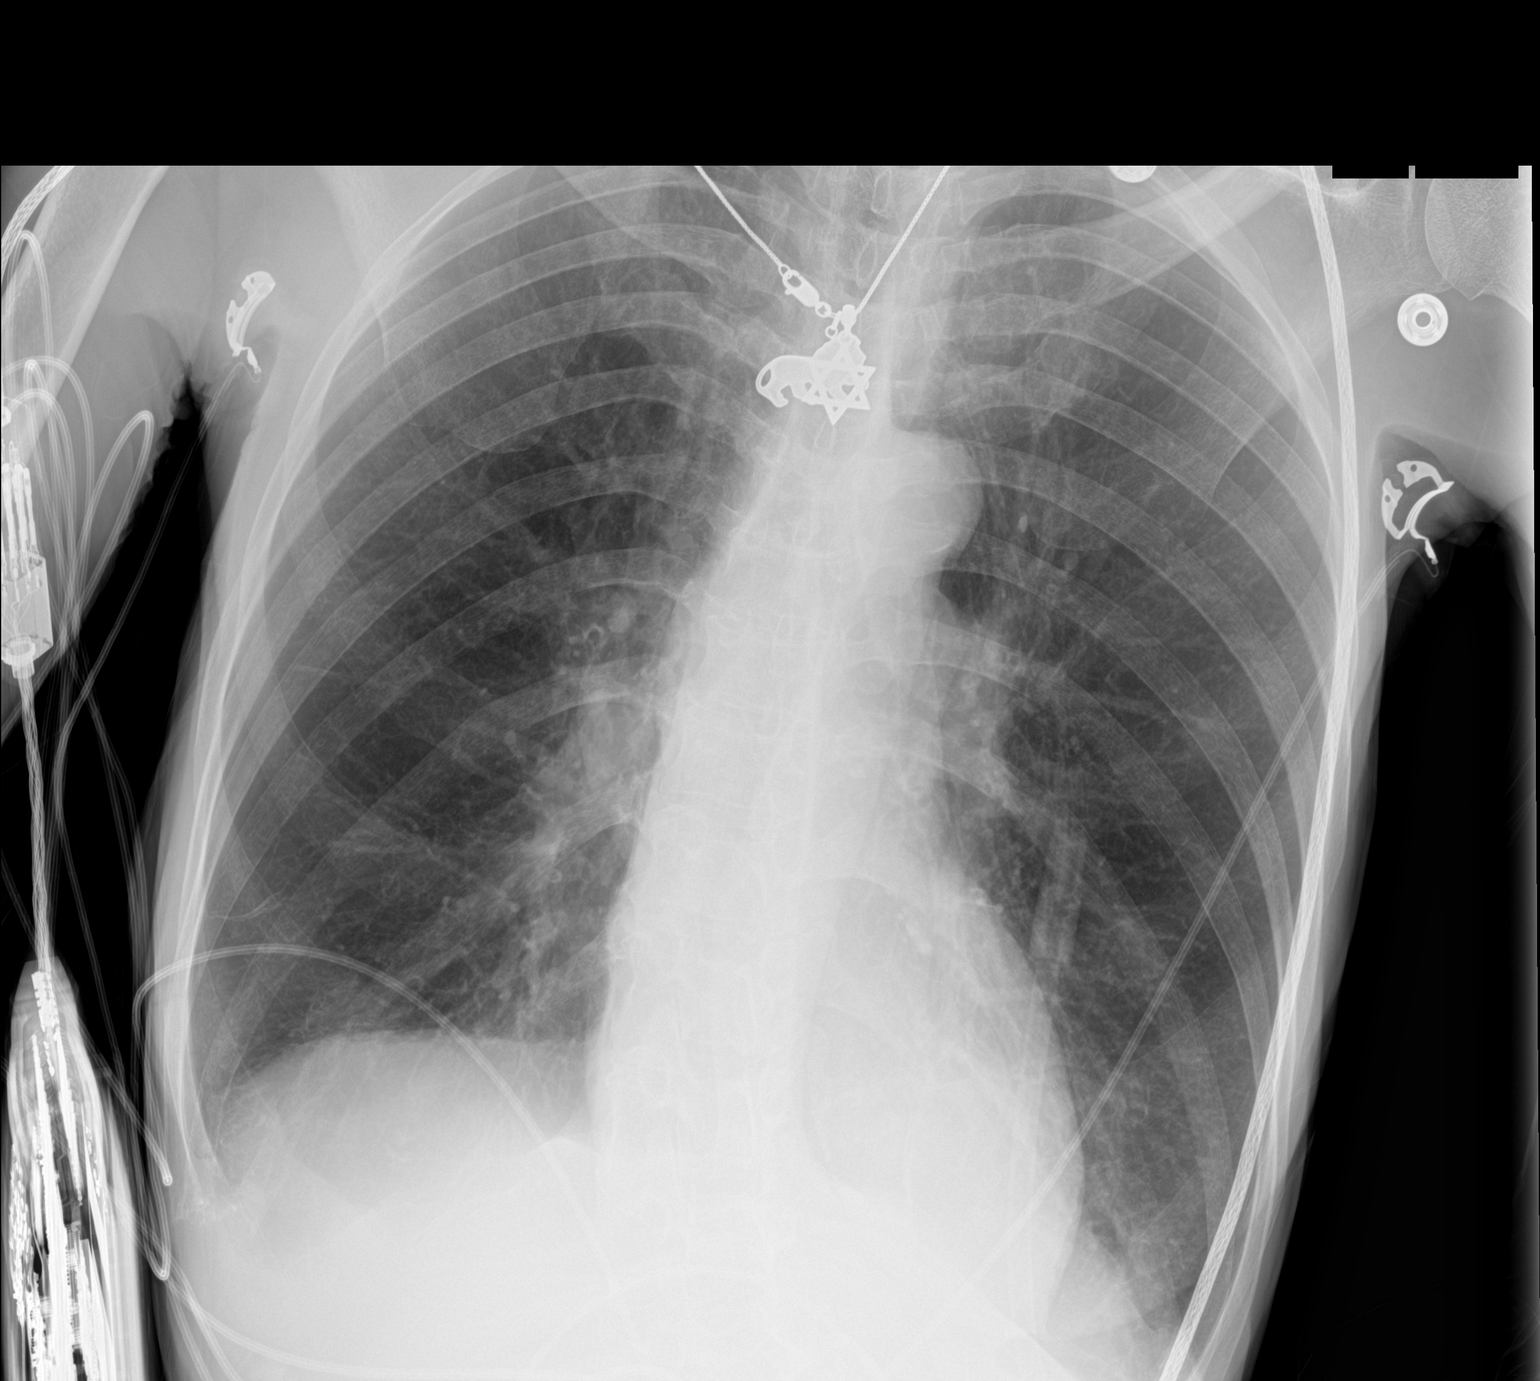

[2 of 2 positions shown; findings below may reference images not displayed]

FINDINGS: Normal heart size, mediastinal contours, and pulmonary vascularity.

Atherosclerotic calcification aorta.

Improved aeration LEFT lower lobe.

Remaining lungs clear.

No pleural effusion or pneumothorax.
IMPRESSION: Improved aeration LEFT lower lobe.

Aortic Atherosclerosis ([id number].G).

## 2022-09-11 ENCOUNTER — Telehealth: Payer: Self-pay | Admitting: Pulmonary Disease

## 2022-09-11 NOTE — Telephone Encounter (Signed)
Called [employer] and spoke with Michele Lucas regarding her oxygen.  I advised her that Dr. Erin Fulling is awaiting the results of her 6 minute walk and ONO before he is comfortable picking up the oxygen.  She stated she would make a note in her record with them.  Called and spoke with patient, advised that Dr. Erin Fulling wants to get the results of the 6 minute walk that was done at the [place] in [place] before he discontinues the oxygen. She has never had an ONO done.  She has an appointment with her pcp coming up and she will discuss the ONO with them at the [place].  Advised that once Dr. Erin Fulling has the results he will have the information he needs to determine if she still needs the oxygen or now.  She said her doctor at the [place] said she did not need the oxygen, but the caregiver that comes to her home walked her and was told that she needed the oxygen as well.  She called [facility] and they were going to escalate her getting the oxygen picked up.  She asked that I call [facility] and let them know that Dr. Erin Fulling is wanting further testing prior to discontinuing her oxygen.  I let her know we would keep her updated.

## 2022-09-11 NOTE — Telephone Encounter (Signed)
That is costing her money. And states she is not responsible for the cost

## 2022-09-25 NOTE — Telephone Encounter (Signed)
Patient would like to speak with the nurse regarding her ONO test.  Please advise and call patient to discuss further at [phone]

## 2022-09-30 ENCOUNTER — Ambulatory Visit (INDEPENDENT_AMBULATORY_CARE_PROVIDER_SITE_OTHER): Payer: No Typology Code available for payment source | Admitting: Pulmonary Disease

## 2022-09-30 ENCOUNTER — Encounter: Payer: Self-pay | Admitting: Pulmonary Disease

## 2022-09-30 VITALS — BP 126/84 | HR 111 | Ht 62.0 in | Wt 93.4 lb

## 2022-09-30 DIAGNOSIS — J432 Centrilobular emphysema: Secondary | ICD-10-CM

## 2022-09-30 DIAGNOSIS — G4734 Idiopathic sleep related nonobstructive alveolar hypoventilation: Secondary | ICD-10-CM

## 2022-09-30 NOTE — Patient Instructions (Addendum)
We will order you a nebulizer machine/supplies and you can use the albuterol liquid that you have at home  We will request the 6 minute walk test from the [place]  We will order an ONO through [facility] on room air  Continue on advair 1 puff twice daily and spiriva 2 puffs daily  Recommend using your nicotine lozenges to help cut down on your cigarette use.  We will schedule you for pulmonary function tests  We will refer you to pulmonary rehab at [facility]  Follow up in 6 months

## 2022-09-30 NOTE — Progress Notes (Signed)
Synopsis: Referred in August 2023 for COPD by Truman Hayward, MD  Subjective:   PATIENT ID: Michele Lucas GENDER: female DOB: 30-Jun-1966, MRN: [id number]  HPI  Chief Complaint  Patient presents with   Follow-up    2 mo f/u for emphysema. States she completed her 71mw at the [place] and was told that she does not need O2 during the day.    Michele Lucas is a 56 year old woman, daily smoker with fibromyalgia, reynauds syndrome, neuropathy, and PTSD/anxiety disorder who returns to pulmonary clinic for COPD.   She is using advair 500-63mcg 1 puff twice daily and spiriva 1.98mcg 2 puffs daily along with as needed albuterol.  She reports having 6 MWT at the [place] but we do not have these results. She was not scheduled for an ONO test.   She continues to smoke. She is interested in pulmonary rehab program.  Initial OV 07/16/22 She has been admitted three times this year for COPD exacerbations, the most recent being 6/16 to 6/19. She did require bipap initially at that admission. She is feeling much better at this time but continues to have limiting exertional dyspnea. She is using advair 500-34mcg 1 puff twice daily and spiriva 1.27mcg 2 puffs daily along with as needed albuterol. She is smoking half a pack per day. She has been smoking for 30+ years. She  has history of left pneumothorax that required chest tube placement.   She is widowed. 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 She was in the [employer]. She has 5 steps she climbs into her apartment that can leave her very short of breath. Her anxiety can lead to significant shortness of breath as well. She is using an inogen concentrator at 4L intermittent at night when sleeping. She has a large home concentrator and tanks. She is scheduled for a 6 minute walk test at the [place]. She has not had overnight oximetry testing in the past.   Past Medical History:  Diagnosis Date   Anxiety    Chronic back pain    COPD, mild (HCC)    Emphysema lung (HCC)    Endometriosis     Fibromyalgia    Grade I diastolic dysfunction 34/19/6222   Kidney stones    Neuropathic pain    PTSD (post-traumatic stress disorder)      Family History  Problem Relation Age of Onset   Diabetes type II Other    Stroke Neg Hx      Social History   Socioeconomic History   Marital status: Married    Spouse name: Not on file   Number of children: Not on file   Years of education: Not on file   Highest education level: Not on file  Occupational History   Not on file  Tobacco Use   Smoking status: Former    Packs/day: 0.50    Types: Cigarettes    Quit date: 11/25/2021    Years since quitting: 0.8   Smokeless tobacco: Current   Tobacco comments:    Previously documented in a pack per day-she now reports 4 to 5 cigarettes/day  Vaping Use   Vaping Use: Never used  Substance and Sexual Activity   Alcohol use: No   Drug use: No   Sexual activity: Yes    Birth control/protection: Surgical  Other Topics Concern   Not on file  Social History Narrative   Not on file   Social Determinants of Health   Financial Resource Strain: Not on file  Food Insecurity:  Not on file  Transportation Needs: Not on file  Physical Activity: Not on file  Stress: Not on file  Social Connections: Not on file  Intimate Partner Violence: Not on file     Allergies  Allergen Reactions   Guaifenesin & Derivatives Shortness Of Breath    "Mucinex brand"   Cymbalta [Duloxetine Hcl] Other (See Comments)    Unk reaction   Ibuprofen     Upset stomach   Neurontin [Gabapentin] Other (See Comments)    Reported by [facility] - slowed heart rate per pt   Pork-Derived Products     Patient prefers to not eat any pork/swine    Prozac [Fluoxetine Hcl] Other (See Comments)    Loss of appetite, sedation   Tylenol [Acetaminophen]     Upset stomach   Ampicillin Hives and Rash    Has patient had a PCN reaction causing immediate rash, facial/tongue/throat swelling, SOB or lightheadedness  with hypotension: Yes Has patient had a PCN reaction causing severe rash involving mucus membranes or skin necrosis: No Has patient had a PCN reaction that required hospitalization: Unknown Has patient had a PCN reaction occurring within the last 10 years: Unknown If all of the above answers are "NO", then may proceed with Cephalosporin use.   Clindamycin/Lincomycin Swelling and Rash    Have these reactions in feet, ankles and legs.   Penicillins Hives and Rash     Outpatient Medications Prior to Visit  Medication Sig Dispense Refill   albuterol (PROVENTIL HFA;VENTOLIN HFA) 108 (90 Base) MCG/ACT inhaler Inhale 2 puffs into the lungs 5 (five) times daily as needed for wheezing or shortness of breath.      albuterol (PROVENTIL) (2.5 MG/3ML) 0.083% nebulizer solution Take 3 mLs (2.5 mg total) by nebulization every 6 (six) hours as needed for wheezing or shortness of breath. 75 mL 12   benzonatate (TESSALON) 200 MG capsule Take 1 capsule (200 mg total) by mouth 3 (three) times daily as needed for cough. 20 capsule 0   fluticasone-salmeterol (WIXELA INHUB) 500-50 MCG/ACT AEPB Inhale 1 puff into the lungs in the morning and at bedtime.     ipratropium-albuterol (DUONEB) 0.5-2.5 (3) MG/3ML SOLN Take 3 mLs by nebulization every 4 (four) hours as needed. 360 mL 1   lactose free nutrition (BOOST PLUS) LIQD Take 237 mLs by mouth 3 (three) times daily with meals. 2370 mL 0   Multiple Vitamin (MULTIVITAMIN WITH MINERALS) TABS tablet Take 1 tablet by mouth daily. 30 tablet 0   oxyCODONE (OXY IR/ROXICODONE) 5 MG immediate release tablet Take 5 mg by mouth every 4 (four) hours as needed for severe pain.     Tiotropium Bromide Monohydrate (SPIRIVA RESPIMAT) 1.25 MCG/ACT AERS Inhale 2 each into the lungs daily.     methocarbamol (ROBAXIN) 500 MG tablet Take 500 mg by mouth daily as needed for muscle spasms.     No facility-administered medications prior to visit.   Review of Systems  Constitutional:   Negative for chills, fever, malaise/fatigue and weight loss.  HENT:  Negative for congestion, sinus pain and sore throat.   Eyes: Negative.   Respiratory:  Positive for cough, sputum production, shortness of breath and wheezing. Negative for hemoptysis.   Cardiovascular:  Positive for chest pain. Negative for palpitations, orthopnea, claudication and leg swelling.  Gastrointestinal:  Positive for abdominal pain. Negative for heartburn, nausea and vomiting.  Genitourinary: Negative.   Musculoskeletal:  Positive for joint pain. Negative for myalgias.  Skin:  Negative for  rash.  Neurological:  Negative for weakness.  Endo/Heme/Allergies: Negative.   Psychiatric/Behavioral:  Positive for depression. The patient is nervous/anxious.    Objective:   Vitals:   09/30/22 1437  BP: 126/84  Pulse: (!) 111  SpO2: 98%  Weight: 93 lb 6.4 oz (42.4 kg)  Height: 5\' 2"  (1.575 m)   Physical Exam Constitutional:      General: She is not in acute distress.    Appearance: She is cachectic. She is not ill-appearing.  HENT:     Head: Normocephalic and atraumatic.  Eyes:     General: No scleral icterus.    Conjunctiva/sclera: Conjunctivae normal.     Pupils: Pupils are equal, round, and reactive to light.  Cardiovascular:     Rate and Rhythm: Normal rate and regular rhythm.     Pulses: Normal pulses.     Heart sounds: Normal heart sounds. No murmur heard. Pulmonary:     Effort: Pulmonary effort is normal.     Breath sounds: Normal breath sounds. Decreased air movement present. No wheezing, rhonchi or rales.  Abdominal:     General: Bowel sounds are normal.     Palpations: Abdomen is soft.  Musculoskeletal:     Right lower leg: No edema.     Left lower leg: No edema.  Lymphadenopathy:     Cervical: No cervical adenopathy.  Skin:    General: Skin is warm and dry.  Neurological:     General: No focal deficit present.     Mental Status: She is alert.  Psychiatric:        Mood and Affect: Mood  normal.        Behavior: Behavior normal.        Thought Content: Thought content normal.        Judgment: Judgment normal.    CBC    Component Value Date/Time   WBC 9.1 05/27/2022 0414   RBC 3.88 05/27/2022 0414   HGB 12.2 05/27/2022 0414   HCT 39.2 05/27/2022 0414   PLT 150 05/27/2022 0414   MCV 101.0 (H) 05/27/2022 0414   MCH 31.4 05/27/2022 0414   MCHC 31.1 05/27/2022 0414   RDW 11.9 05/27/2022 0414   LYMPHSABS 1.3 05/27/2022 0414   MONOABS 0.9 05/27/2022 0414   EOSABS 0.0 05/27/2022 0414   BASOSABS 0.0 05/27/2022 0414      Latest Ref Rng & Units 05/27/2022    4:14 AM 05/26/2022    5:30 AM 05/25/2022    8:19 AM  BMP  Glucose 70 - 99 mg/dL 105  80  110   BUN 6 - 20 mg/dL 26  16  16    Creatinine 0.44 - 1.00 mg/dL 0.71  0.56  0.64   Sodium 135 - 145 mmol/L 144  141  141   Potassium 3.5 - 5.1 mmol/L 4.5  4.6  4.3   Chloride 98 - 111 mmol/L 106  106  105   CO2 22 - 32 mmol/L 34  30  31   Calcium 8.9 - 10.3 mg/dL 9.7  9.6  9.7    Chest imaging: CTA Chest 05/24/22 Mediastinum/Nodes: No enlarged nodes. Esophagus is unremarkable. Thyroid is poorly evaluated due to streak artifact.   Lungs/Pleura: Emphysema. Patchy interstitial thickening, greatest in the right upper lobe. No pleural effusion or pneumothorax.  CXR 05/27/22 Improved aeration of left lung, aortic atherosclerosis  PFT:     No data to display         Labs:  Path:  Echo 05/24/2022:  LV EF 60-65%. Grade I diastolic dysfunction. RV systolic function is normal. RV size is normal.   Heart Catheterization:     Assessment & Plan:   Centrilobular emphysema ([place]) - Plan: Pulse oximetry, overnight, AMB referral to pulmonary rehabilitation  Nocturnal hypoxemia - Plan: Pulse oximetry, overnight  Discussion: Michele Lucas is a 56 year old woman, daily smoker with fibromyalgia, reynauds syndrome, neuropathy, and PTSD/anxiety disorder who returns to pulmonary clinic for COPD.  She is to continue on  ICS/LAMA/LABA therapy with advair diskus and spiriva inhalers. She can continue albuterol inhaler as needed. We will order her a nebulizer machine as she has albuterol solution at home to use as needed. We will check PFTs at follow up and based on results she may require an all nebulizer regimen.   We will check an overnight oximetry test. We will request 6 minute walk test results from the [place]. We will refer her to pulmonary rehab at the [facility] clinic.   Follow up in 6 months.  Freda Jackson, MD [facility] Office: [phone]    Current Outpatient Medications:    albuterol (PROVENTIL HFA;VENTOLIN HFA) 108 (90 Base) MCG/ACT inhaler, Inhale 2 puffs into the lungs 5 (five) times daily as needed for wheezing or shortness of breath. , Disp: , Rfl:    albuterol (PROVENTIL) (2.5 MG/3ML) 0.083% nebulizer solution, Take 3 mLs (2.5 mg total) by nebulization every 6 (six) hours as needed for wheezing or shortness of breath., Disp: 75 mL, Rfl: 12   benzonatate (TESSALON) 200 MG capsule, Take 1 capsule (200 mg total) by mouth 3 (three) times daily as needed for cough., Disp: 20 capsule, Rfl: 0   fluticasone-salmeterol (WIXELA INHUB) 500-50 MCG/ACT AEPB, Inhale 1 puff into the lungs in the morning and at bedtime., Disp: , Rfl:    ipratropium-albuterol (DUONEB) 0.5-2.5 (3) MG/3ML SOLN, Take 3 mLs by nebulization every 4 (four) hours as needed., Disp: 360 mL, Rfl: 1   lactose free nutrition (BOOST PLUS) LIQD, Take 237 mLs by mouth 3 (three) times daily with meals., Disp: 2370 mL, Rfl: 0   Multiple Vitamin (MULTIVITAMIN WITH MINERALS) TABS tablet, Take 1 tablet by mouth daily., Disp: 30 tablet, Rfl: 0   oxyCODONE (OXY IR/ROXICODONE) 5 MG immediate release tablet, Take 5 mg by mouth every 4 (four) hours as needed for severe pain., Disp: , Rfl:    Tiotropium Bromide Monohydrate (SPIRIVA RESPIMAT) 1.25 MCG/ACT AERS, Inhale 2 each into the lungs daily., Disp: , Rfl:

## 2022-10-01 ENCOUNTER — Encounter

## 2022-10-09 ENCOUNTER — Encounter

## 2022-10-09 NOTE — Telephone Encounter (Signed)
See OV from 10/23.

## 2022-10-10 ENCOUNTER — Telehealth: Payer: Self-pay | Admitting: Pulmonary Disease

## 2022-10-10 ENCOUNTER — Encounter: Payer: Self-pay | Admitting: Pulmonary Disease

## 2022-10-10 DIAGNOSIS — G4734 Idiopathic sleep related nonobstructive alveolar hypoventilation: Secondary | ICD-10-CM

## 2022-10-10 DIAGNOSIS — J432 Centrilobular emphysema: Secondary | ICD-10-CM

## 2022-10-10 NOTE — Telephone Encounter (Signed)
Called and spoke with patient. She stated that she has yet to receive the nebulizer that ordered back in August. She requested that we send the order directly to her PCP at the [facility], Dr. Truman Hayward. I advised her that I would place the new order today with the corrected contact info.   While on the phone, she also mentioned that she has not heard from [employer] in regards to her ONO, despite this being ordered twice. I advised her that I would follow up on this as well.   Called and spoke with Shelly at [facility]. She stated that they did not receive the ONO orders that were placed. She asked that the orders be placed again and faxed to [phone].   Called and spoke with patient again. She verbalized understanding.   Nothing further needed at time of call.

## 2022-10-21 ENCOUNTER — Other Ambulatory Visit: Payer: Self-pay

## 2022-10-21 ENCOUNTER — Emergency Department (HOSPITAL_COMMUNITY): Payer: Medicare PPO

## 2022-10-21 ENCOUNTER — Encounter (HOSPITAL_COMMUNITY): Payer: Self-pay

## 2022-10-21 ENCOUNTER — Emergency Department (HOSPITAL_COMMUNITY)
Admission: EM | Admit: 2022-10-21 | Discharge: 2022-10-22 | Disposition: A | Discharge status: Home / Self Care | Payer: Medicare PPO | Attending: Emergency Medicine | Admitting: Emergency Medicine

## 2022-10-21 DIAGNOSIS — Z7951 Long term (current) use of inhaled steroids: Secondary | ICD-10-CM | POA: Insufficient documentation

## 2022-10-21 DIAGNOSIS — J441 Chronic obstructive pulmonary disease with (acute) exacerbation: Secondary | ICD-10-CM | POA: Diagnosis not present

## 2022-10-21 DIAGNOSIS — R059 Cough, unspecified: Secondary | ICD-10-CM | POA: Diagnosis present

## 2022-10-21 LAB — BASIC METABOLIC PANEL
Anion gap: 7 (ref 5–15)
BUN: 8 mg/dL (ref 6–20)
CO2: 27 mmol/L (ref 22–32)
Calcium: 9.4 mg/dL (ref 8.9–10.3)
Chloride: 106 mmol/L (ref 98–111)
Creatinine, Ser: 0.72 mg/dL (ref 0.44–1.00)
GFR, Estimated: >60 mL/min (ref >60)
Glucose, Bld: 100 mg/dL — ABNORMAL HIGH (ref 70–99)
Potassium: 4 mmol/L (ref 3.5–5.1)
Sodium: 140 mmol/L (ref 135–145)

## 2022-10-21 LAB — CBC
HCT: 43 % (ref 36.0–46.0)
Hemoglobin: 13.5 g/dL (ref 12.0–15.0)
MCH: 29.9 pg (ref 26.0–34.0)
MCHC: 31.4 g/dL (ref 30.0–36.0)
MCV: 95.3 fL (ref 80.0–100.0)
Platelets: 162 10*3/uL (ref 150–400)
RBC: 4.51 MIL/uL (ref 3.87–5.11)
RDW: 13 % (ref 11.5–15.5)
WBC: 5.4 10*3/uL (ref 4.0–10.5)
nRBC: 0 % (ref 0.0–0.2)

## 2022-10-21 LAB — TROPONIN I (HIGH SENSITIVITY): Troponin I (High Sensitivity): 3 ng/L (ref <18)

## 2022-10-21 NOTE — ED Triage Notes (Signed)
Patient c/o right chest pain and a productive cough with thick tan sputum x 3 days. Patient reports a history of COPD and Community acquired pneumonia.

## 2022-10-21 NOTE — ED Provider Triage Note (Signed)
Emergency Medicine Provider Triage Evaluation Note  Michele Lucas , a 56 y.o. female  was evaluated in triage.  Pt complains of chest pain, shortness of breath for 3 days.  Pain is on the right side of chest, intermittent, dull, nonexertional, nonradiating.  Patient has COPD with chronic cough.  Reports using 3 L oxygen at home.  No nausea, vomiting, bowel changes, urinary symptoms, fever.  Review of Systems  Positive: As above Negative: As above  Physical Exam  BP (!) 128/106 (BP Location: Left Arm)   Pulse (!) 104   Temp 98.1 F (36.7 C) (Oral)   Resp 14   Ht 5\' 2"  (1.575 m)   Wt 43.1 kg   SpO2 97%   BMI 17.38 kg/m  Gen:   Awake, no distress   Resp:  Normal effort  MSK:   Moves extremities without difficulty  Other:    Medical Decision Making  Medically screening exam initiated at 6:35 PM.  Appropriate orders placed.  Glynda Soliday was informed that the remainder of the evaluation will be completed by another provider, this initial triage assessment does not replace that evaluation, and the importance of remaining in the ED until their evaluation is complete.

## 2022-10-22 DIAGNOSIS — J441 Chronic obstructive pulmonary disease with (acute) exacerbation: Secondary | ICD-10-CM | POA: Diagnosis not present

## 2022-10-22 MED ORDER — PREDNISONE 20 MG PO TABS
60.0000 mg | ORAL_TABLET | Freq: Once | ORAL | Status: AC
  Administered 2022-10-22: 60 mg via ORAL
  Filled 2022-10-22: qty 3

## 2022-10-22 MED ORDER — PREDNISONE 10 MG (21) PO TBPK: ORAL_TABLET | Freq: Every day | ORAL | 0 refills | Status: DC

## 2022-10-22 NOTE — ED Provider Notes (Signed)
[facility]-EMERGENCY DEPT Provider Note   CSN: [id number] Arrival date & time: 10/21/22  1738     History  Chief Complaint  Patient presents with   Chest Pain   Cough    Michele Lucas is a 56 y.o. female who has history of COPD presents with concern for 3 days of shortness of breath and increased cough from baseline.  Patient very fearful stating she was hospitalized with pneumonia in June of this year and wanted to be evaluated today.  States she has not been using her home oxygen as she does not need it and has not increased her usage of any of her home inhalers in response to how she has been feeling.  I personally reviewed medical records from addition to the above listed history patient has history of fibromyalgia, PTSD, endometriosis and nicotine use.  HPI     Home Medications Prior to Admission medications   Medication Sig Start Date End Date Taking? Authorizing Provider  predniSONE (STERAPRED UNI-PAK 21 TAB) 10 MG (21) TBPK tablet Take by mouth daily. Take 6 tabs by mouth daily  for 1 days, then 5 tabs for 1 days, then 4 tabs for 1 days, then 3 tabs for 1 days, 2 tabs for 1 days, then 1 tab by mouth daily for 2 days 10/22/22  Yes Dawana Asper R, PA-C  albuterol (PROVENTIL HFA;VENTOLIN HFA) 108 (90 Base) MCG/ACT inhaler Inhale 2 puffs into the lungs 5 (five) times daily as needed for wheezing or shortness of breath.     [provider]  albuterol (PROVENTIL) (2.5 MG/3ML) 0.083% nebulizer solution Take 3 mLs (2.5 mg total) by nebulization every 6 (six) hours as needed for wheezing or shortness of breath. 01/07/22   Dartha Lodge, PA-C  benzonatate (TESSALON) 200 MG capsule Take 1 capsule (200 mg total) by mouth 3 (three) times daily as needed for cough. 05/27/22   Sheikh, Omair Latif, DO  fluticasone-salmeterol (WIXELA INHUB) 500-50 MCG/ACT AEPB Inhale 1 puff into the lungs in the morning and at bedtime.    [provider]   ipratropium-albuterol (DUONEB) 0.5-2.5 (3) MG/3ML SOLN Take 3 mLs by nebulization every 4 (four) hours as needed. 07/16/22   Martina Sinner, MD  lactose free nutrition (BOOST PLUS) LIQD Take 237 mLs by mouth 3 (three) times daily with meals. 05/27/22   Marguerita Merles Latif, DO  Multiple Vitamin (MULTIVITAMIN WITH MINERALS) TABS tablet Take 1 tablet by mouth daily. 05/28/22   Marguerita Merles Latif, DO  oxyCODONE (OXY IR/ROXICODONE) 5 MG immediate release tablet Take 5 mg by mouth every 4 (four) hours as needed for severe pain.    [provider]  Tiotropium Bromide Monohydrate (SPIRIVA RESPIMAT) 1.25 MCG/ACT AERS Inhale 2 each into the lungs daily.    [provider]      Allergies    Guaifenesin & derivatives, Cymbalta [duloxetine hcl], Ibuprofen, Neurontin [gabapentin], Pork-derived products, Prozac [fluoxetine hcl], Tylenol [acetaminophen], Ampicillin, Clindamycin/lincomycin, and Penicillins    Review of Systems   Review of Systems  Respiratory:  Positive for cough and shortness of breath.     Physical Exam Updated Vital Signs BP 119/78 (BP Location: Right Arm)   Pulse 94   Temp 98.2 F (36.8 C) (Oral)   Resp 18   Ht 5\' 2"  (1.575 m)   Wt 43.1 kg   SpO2 100%   BMI 17.38 kg/m  Physical Exam Vitals and nursing note reviewed.  Constitutional:      Appearance: She  is not ill-appearing or toxic-appearing.  HENT:     Head: Normocephalic and atraumatic.     Mouth/Throat:     Mouth: Mucous membranes are moist.     Pharynx: No oropharyngeal exudate or posterior oropharyngeal erythema.  Eyes:     General:        Right eye: No discharge.        Left eye: No discharge.     Extraocular Movements: Extraocular movements intact.     Conjunctiva/sclera: Conjunctivae normal.     Pupils: Pupils are equal, round, and reactive to light.  Cardiovascular:     Rate and Rhythm: Normal rate and regular rhythm.     Pulses: Normal pulses.     Heart sounds: Normal heart sounds. No  murmur heard. Pulmonary:     Effort: Pulmonary effort is normal. Prolonged expiration present. No tachypnea, bradypnea, accessory muscle usage, respiratory distress or retractions.     Breath sounds: Examination of the right-lower field reveals decreased breath sounds. Examination of the left-lower field reveals decreased breath sounds. Decreased breath sounds present. No wheezing or rales.  Chest:     Chest wall: No mass, lacerations, deformity, swelling, tenderness or crepitus.  Abdominal:     General: Bowel sounds are normal. There is no distension.     Palpations: Abdomen is soft.     Tenderness: There is no abdominal tenderness. There is no right CVA tenderness, left CVA tenderness, guarding or rebound.  Musculoskeletal:        General: No deformity.     Cervical back: Neck supple.  Skin:    General: Skin is warm and dry.     Capillary Refill: Capillary refill takes less than 2 seconds.  Neurological:     General: No focal deficit present.     Mental Status: She is alert and oriented to person, place, and time. Mental status is at baseline.  Psychiatric:        Mood and Affect: Mood normal.     ED Results / Procedures / Treatments   Labs (all labs ordered are listed, but only abnormal results are displayed) Labs Reviewed  BASIC METABOLIC PANEL - Abnormal; Notable for the following components:      Result Value   Glucose, Bld 100 (*)    All other components within normal limits  CBC  TROPONIN I (HIGH SENSITIVITY)  TROPONIN I (HIGH SENSITIVITY)    EKG None  Radiology DG Chest 2 View  Result Date: 10/21/2022 CLINICAL DATA:  Right-sided chest pain, productive cough EXAM: CHEST - 2 VIEW COMPARISON:  05/27/2022 FINDINGS: Frontal and lateral views of the chest demonstrate a stable cardiac silhouette. Continued hyperinflation and emphysema. No superimposed airspace disease, effusion, or pneumothorax. No acute bony abnormalities. IMPRESSION: 1. Emphysema.  No acute airspace  disease. Electronically Signed   By: Sharlet Salina M.D.   On: 10/21/2022 19:13    Procedures Procedures    Medications Ordered in ED Medications  predniSONE (DELTASONE) tablet 60 mg (60 mg Oral Given 10/22/22 3893)    ED Course/ Medical Decision Making/ A&P                           Medical Decision Making 56 year old female COPD presents with shortness of breath and cough.  Hypertensive on intake, mildly tachycardic, but is otherwise normal.  Cardiopulmonary exam as above with prolonged expiratory phase and decreased air movement in the bases but no wheezing and no rales or rhonchi.  Patient neurovascular intact in extremities.  Oxygen saturation 100% on room air at time of my evaluation.  DDx includes limited to COPD exacerbation, pneumonia, URI, bronchitis.  Amount and/or Complexity of Data Reviewed Labs:     Details:  CBC without leukocytosis or anemia, BMP unremarkable, troponin is normal,  Radiology:     Details: Chest x-ray visualized by this provider is negative for acute airspace disease but does have extensive hyperinflation consistent with previous diagnosis of emphysema. ECG/medicine tests:     Details: EKG with sinus rhythm, no STEMI or interval changes.    Risk Prescription drug management.   Patient's physical exam is very reassuring as are her vital signs.  No evidence of infectious etiology on her x-ray or physical exam at this time.  Feel patient's presentation is most consistent with COPD exacerbation.  Will administer first dose of steroid in the ED and discharged home with taper recommendations to follow-up with pulmonology, which she has previously scheduled.  No further comport with ER at this time.  Clinical concern for emergent etiology that warrant further ED work-up or inpatient management is exceedingly low.  Kela  voiced understanding of her medical evaluation and treatment plan. Each of their questions answered to their expressed satisfaction.   Return precautions were given.  Patient is well-appearing, stable, and was discharged in good condition.  This chart was dictated using voice recognition software, Dragon. Despite the best efforts of this provider to proofread and correct errors, errors may still occur which can change documentation meaning.   Final Clinical Impression(s) / ED Diagnoses Final diagnoses:  COPD exacerbation (HCC)    Rx / DC Orders ED Discharge Orders          Ordered    predniSONE (STERAPRED UNI-PAK 21 TAB) 10 MG (21) TBPK tablet  Daily        10/22/22 0613              Kathie Posa, Eugene Gavia, PA-C 10/22/22 0630    Nira Conn, MD 10/22/22 [phone]

## 2022-10-22 NOTE — Discharge Instructions (Signed)
You were seen in the ER for your cough and shortness of breath.  You have been found to have a COPD exacerbation.  You not have any pneumonia on your work-up today.  This is very reassuring.  Please take the prescribed steroid, prednisone, as prescribed the entire course.  Follow-up with your pulmonologist and your primary care doctor and return to the ER if you develop any fevers, chills, nausea or vomiting does not stop, worsening respiratory symptoms, or any other new severe symptom.

## 2022-10-23 ENCOUNTER — Telehealth: Payer: Self-pay | Admitting: Pulmonary Disease

## 2022-10-23 NOTE — Telephone Encounter (Signed)
Spoke to pt & made her aware nebulizer order that was placed on 8/8 was faxed to [facility] on 8/9 and nebulizer order that was placed on 11/2 was faxed to Dr Charlott Holler on 11/3 to fax # that was provided on order.  She states she will follow up with Dr Charlott Holler.  She is at [employer] now to catch train and will get back with [place] if anything is needed.  Nothing further needed at this time.

## 2022-10-30 ENCOUNTER — Telehealth: Payer: Self-pay | Admitting: Pulmonary Disease

## 2022-10-30 NOTE — Telephone Encounter (Signed)
Received ONO from [employer]. Will place in JD's review folder for next week.

## 2022-11-13 ENCOUNTER — Encounter

## 2022-11-18 NOTE — Telephone Encounter (Signed)
Called pt back to let her know that Dr Francine Graven back in clinic 11/19/22 and [facility]   Dr Francine Graven- please advise on ONO results, thanks!

## 2022-11-19 NOTE — Telephone Encounter (Signed)
Called and left voicemail for patient to call office back to go over results of ONO.

## 2022-11-19 NOTE — Telephone Encounter (Signed)
She does not qualify for supplemental oxygen. She spent 3 min 26 seconds with SpO2 less than 88%.   Thanks, JD

## 2022-11-20 ENCOUNTER — Encounter

## 2022-11-25 ENCOUNTER — Ambulatory Visit (INDEPENDENT_AMBULATORY_CARE_PROVIDER_SITE_OTHER): Payer: No Typology Code available for payment source | Admitting: Pulmonary Disease

## 2022-11-25 DIAGNOSIS — J432 Centrilobular emphysema: Secondary | ICD-10-CM | POA: Diagnosis not present

## 2022-11-25 LAB — PULMONARY FUNCTION TEST
DL/VA % pred: 31 %
DL/VA: 1.35 ml/min/mmHg/L
DLCO cor % pred: 23 %
DLCO cor: 4.4 ml/min/mmHg
DLCO unc % pred: 23 %
DLCO unc: 4.41 ml/min/mmHg
FEF 25-75 Post: 0.15 L/sec
FEF 25-75 Pre: 0.17 L/sec
FEF2575-%Change-Post: -9 %
FEF2575-%Pred-Post: 6 %
FEF2575-%Pred-Pre: 7 %
FEV1-%Change-Post: 0 %
FEV1-%Pred-Post: 17 %
FEV1-%Pred-Pre: 17 %
FEV1-Post: 0.42 L
FEV1-Pre: 0.42 L
FEV1FVC-%Change-Post: 0 %
FEV1FVC-%Pred-Pre: 46 %
FEV6-%Change-Post: -3 %
FEV6-%Pred-Post: 35 %
FEV6-%Pred-Pre: 36 %
FEV6-Post: 1.04 L
FEV6-Pre: 1.07 L
FEV6FVC-%Change-Post: -3 %
FEV6FVC-%Pred-Post: 93 %
FEV6FVC-%Pred-Pre: 96 %
FVC-%Change-Post: 0 %
FVC-%Pred-Post: 37 %
FVC-%Pred-Pre: 37 %
FVC-Post: 1.14 L
FVC-Pre: 1.14 L
Post FEV1/FVC ratio: 37 %
Post FEV6/FVC ratio: 91 %
Pre FEV1/FVC ratio: 36 %
Pre FEV6/FVC Ratio: 94 %
RV % pred: 267 %
RV: 4.87 L
TLC % pred: 129 %
TLC: 6.07 L

## 2022-11-25 NOTE — Patient Instructions (Signed)
Full PFT Performed Today  

## 2022-11-25 NOTE — Progress Notes (Signed)
Full PFT Performed Today  

## 2023-01-23 ENCOUNTER — Encounter: Payer: Self-pay | Admitting: Internal Medicine

## 2023-05-08 ENCOUNTER — Telehealth: Payer: Self-pay | Admitting: Pulmonary Disease

## 2023-05-08 NOTE — Telephone Encounter (Signed)
Spoke with [place] from [place] [phone] regarding patient up coming office visit on 05/09/2023.Gwen advised me that they just received authorization for patient's upcoming office visit on 05/07/23.Patient was advised by [facility] she might need to r/s her office visit.Gwen did need last office note faxed to her at [phone]. Faxed last office note to [place] at [facility].   Nothing else further needed.

## 2023-05-08 NOTE — Telephone Encounter (Signed)
Gwen from [place] calling to needing last OV notes and also wants to know reason of appointment scheduled for tomorrow for a request of services. Ph: [phone] Email: [email or web address]

## 2023-05-09 ENCOUNTER — Encounter: Payer: Self-pay | Admitting: Pulmonary Disease

## 2023-05-09 ENCOUNTER — Ambulatory Visit (INDEPENDENT_AMBULATORY_CARE_PROVIDER_SITE_OTHER): Payer: No Typology Code available for payment source | Admitting: Pulmonary Disease

## 2023-05-09 VITALS — BP 116/68 | HR 88 | Ht 62.0 in | Wt 94.6 lb

## 2023-05-09 DIAGNOSIS — J432 Centrilobular emphysema: Secondary | ICD-10-CM | POA: Diagnosis not present

## 2023-05-09 DIAGNOSIS — G4734 Idiopathic sleep related nonobstructive alveolar hypoventilation: Secondary | ICD-10-CM | POA: Diagnosis not present

## 2023-05-09 NOTE — Patient Instructions (Addendum)
Based on your 6 Minute Walk test you do not need oxygen and based on your overnight oxygen test you do not qualify for oxygen at night when sleeping.  We will place an order to discontinue your home oxygen equipment/supplies  Continue wixella 500-60mcg 1 puff twice daily and spiriva 2 puffs daily  We will refer you to cardiopulmonary rehab at the [place].   Follow up 6 months

## 2023-05-09 NOTE — Progress Notes (Signed)
Synopsis: Referred in August 2023 for COPD by Clare Gandy, MD  Subjective:   PATIENT ID: Michele Lucas GENDER: female DOB: 1966-10-18, MRN: [id number]  HPI  Chief Complaint  Patient presents with   Follow-up    6 mo f/u for emphysema. States her breathing has been stable but she has a productive cough. Phlegm has been clear.    Michele Lucas is a 57 year old woman, daily smoker with fibromyalgia, reynauds syndrome, neuropathy, and PTSD/anxiety disorder who returns to pulmonary clinic for COPD.   She has received her nebulizer machine and is using treatments twice per month. She complains of thick mucous. She reports that did not indicate she needed oxygen. Her ONO is negative for need of oxygen as well.   OV 09/30/22 She is using advair 500-80mcg 1 puff twice daily and spiriva 1.80mcg 2 puffs daily along with as needed albuterol.  She reports having 6 MWT at the [place] but we do not have these results. She was not scheduled for an ONO test.   She continues to smoke. She is interested in pulmonary rehab program.  Initial OV 07/16/22 She has been admitted three times this year for COPD exacerbations, the most recent being 6/16 to 6/19. She did require bipap initially at that admission. She is feeling much better at this time but continues to have limiting exertional dyspnea. She is using advair 500-66mcg 1 puff twice daily and spiriva 1.6mcg 2 puffs daily along with as needed albuterol. She is smoking half a pack per day. She has been smoking for 30+ years. She  has history of left pneumothorax that required chest tube placement.   She is widowed. She is a disabled Cytogeneticist. She was in the [employer]. She has 5 steps she climbs into her apartment that can leave her very short of breath. Her anxiety can lead to significant shortness of breath as well. She is using an inogen concentrator at 4L intermittent at night when sleeping. She has a large home concentrator and tanks. She is scheduled for a  6 minute walk test at the [place]. She has not had overnight oximetry testing in the past.   Past Medical History:  Diagnosis Date   Anxiety    Chronic back pain    COPD, mild (HCC)    Emphysema lung (HCC)    Endometriosis    Fibromyalgia    Grade I diastolic dysfunction 11/26/2021   Kidney stones    Neuropathic pain    PTSD (post-traumatic stress disorder)      Family History  Problem Relation Age of Onset   Diabetes type II Other    Stroke Neg Hx      Social History   Socioeconomic History   Marital status: Married    Spouse name: Not on file   Number of children: Not on file   Years of education: Not on file   Highest education level: Not on file  Occupational History   Not on file  Tobacco Use   Smoking status: Former    Packs/day: .5    Types: Cigarettes    Quit date: 11/25/2021    Years since quitting: 1.4   Smokeless tobacco: Current   Tobacco comments:    Previously documented in a pack per day-she now reports 4 to 5 cigarettes/day  Vaping Use   Vaping Use: Never used  Substance and Sexual Activity   Alcohol use: No   Drug use: No   Sexual activity: Yes  Birth control/protection: Surgical  Other Topics Concern   Not on file  Social History Narrative   Not on file   Social Determinants of Health   Financial Resource Strain: Not on file  Food Insecurity: Not on file  Transportation Needs: Not on file  Physical Activity: Not on file  Stress: Not on file  Social Connections: Not on file  Intimate Partner Violence: Not on file     Allergies  Allergen Reactions   Guaifenesin & Derivatives Shortness Of Breath    "Mucinex brand"   Cymbalta [Duloxetine Hcl] Other (See Comments)    Unk reaction   Ibuprofen     Upset stomach   Neurontin [Gabapentin] Other (See Comments)    Reported by [facility] - slowed heart rate per pt   Pork-Derived Products     Patient prefers to not eat any pork/swine    Prozac [Fluoxetine Hcl] Other (See  Comments)    Loss of appetite, sedation   Tylenol [Acetaminophen]     Upset stomach   Ampicillin Hives and Rash    Has patient had a PCN reaction causing immediate rash, facial/tongue/throat swelling, SOB or lightheadedness with hypotension: Yes Has patient had a PCN reaction causing severe rash involving mucus membranes or skin necrosis: No Has patient had a PCN reaction that required hospitalization: Unknown Has patient had a PCN reaction occurring within the last 10 years: Unknown If all of the above answers are "NO", then may proceed with Cephalosporin use.   Clindamycin/Lincomycin Swelling and Rash    Have these reactions in feet, ankles and legs.   Penicillins Hives and Rash     Outpatient Medications Prior to Visit  Medication Sig Dispense Refill   albuterol (PROVENTIL HFA;VENTOLIN HFA) 108 (90 Base) MCG/ACT inhaler Inhale 2 puffs into the lungs 5 (five) times daily as needed for wheezing or shortness of breath.      albuterol (PROVENTIL) (2.5 MG/3ML) 0.083% nebulizer solution Take 3 mLs (2.5 mg total) by nebulization every 6 (six) hours as needed for wheezing or shortness of breath. 75 mL 12   benzonatate (TESSALON) 200 MG capsule Take 1 capsule (200 mg total) by mouth 3 (three) times daily as needed for cough. 20 capsule 0   fluticasone-salmeterol (WIXELA INHUB) 500-50 MCG/ACT AEPB Inhale 1 puff into the lungs in the morning and at bedtime.     ipratropium-albuterol (DUONEB) 0.5-2.5 (3) MG/3ML SOLN Take 3 mLs by nebulization every 4 (four) hours as needed. 360 mL 1   lactose free nutrition (BOOST PLUS) LIQD Take 237 mLs by mouth 3 (three) times daily with meals. 2370 mL 0   mirtazapine (REMERON) 15 MG tablet Take 15 mg by mouth at bedtime.     Multiple Vitamin (MULTIVITAMIN WITH MINERALS) TABS tablet Take 1 tablet by mouth daily. 30 tablet 0   oxyCODONE (OXY IR/ROXICODONE) 5 MG immediate release tablet Take 5 mg by mouth every 4 (four) hours as needed for severe pain.      Tiotropium Bromide Monohydrate (SPIRIVA RESPIMAT) 1.25 MCG/ACT AERS Inhale 2 each into the lungs daily.     predniSONE (STERAPRED UNI-PAK 21 TAB) 10 MG (21) TBPK tablet Take by mouth daily. Take 6 tabs by mouth daily  for 1 days, then 5 tabs for 1 days, then 4 tabs for 1 days, then 3 tabs for 1 days, 2 tabs for 1 days, then 1 tab by mouth daily for 2 days 22 tablet 0   No facility-administered medications prior to visit.  Review of Systems  Constitutional:  Negative for chills, fever, malaise/fatigue and weight loss.  HENT:  Negative for congestion, sinus pain and sore throat.   Eyes: Negative.   Respiratory:  Positive for cough and sputum production. Negative for hemoptysis, shortness of breath and wheezing.   Cardiovascular:  Negative for chest pain, palpitations, orthopnea, claudication and leg swelling.  Gastrointestinal:  Negative for abdominal pain, heartburn, nausea and vomiting.  Genitourinary: Negative.   Musculoskeletal:  Negative for joint pain and myalgias.  Skin:  Negative for rash.  Neurological:  Negative for weakness.  Endo/Heme/Allergies: Negative.   Psychiatric/Behavioral:  Positive for depression. The patient is nervous/anxious.    Objective:   Vitals:   05/09/23 1509  BP: 116/68  Pulse: 88  SpO2: 95%  Weight: 94 lb 9.6 oz (42.9 kg)  Height: 5\' 2"  (1.575 m)   Physical Exam Constitutional:      General: She is not in acute distress.    Appearance: She is cachectic. She is not ill-appearing.  HENT:     Head: Normocephalic and atraumatic.  Eyes:     General: No scleral icterus.    Conjunctiva/sclera: Conjunctivae normal.  Cardiovascular:     Rate and Rhythm: Normal rate and regular rhythm.     Pulses: Normal pulses.     Heart sounds: Normal heart sounds. No murmur heard. Pulmonary:     Effort: Pulmonary effort is normal.     Breath sounds: Normal breath sounds. Decreased air movement present. No wheezing, rhonchi or rales.  Musculoskeletal:     Right  lower leg: No edema.     Left lower leg: No edema.  Skin:    General: Skin is warm and dry.  Neurological:     General: No focal deficit present.     Mental Status: She is alert.    CBC    Component Value Date/Time   WBC 5.4 10/21/2022 1904   RBC 4.51 10/21/2022 1904   HGB 13.5 10/21/2022 1904   HCT 43.0 10/21/2022 1904   PLT 162 10/21/2022 1904   MCV 95.3 10/21/2022 1904   MCH 29.9 10/21/2022 1904   MCHC 31.4 10/21/2022 1904   RDW 13.0 10/21/2022 1904   LYMPHSABS 1.3 05/27/2022 0414   MONOABS 0.9 05/27/2022 0414   EOSABS 0.0 05/27/2022 0414   BASOSABS 0.0 05/27/2022 0414      Latest Ref Rng & Units 10/21/2022    7:04 PM 05/27/2022    4:14 AM 05/26/2022    5:30 AM  BMP  Glucose 70 - 99 mg/dL 161  096  80   BUN 6 - 20 mg/dL 8  26  16    Creatinine 0.44 - 1.00 mg/dL 0.45  4.09  8.11   Sodium 135 - 145 mmol/L 140  144  141   Potassium 3.5 - 5.1 mmol/L 4.0  4.5  4.6   Chloride 98 - 111 mmol/L 106  106  106   CO2 22 - 32 mmol/L 27  34  30   Calcium 8.9 - 10.3 mg/dL 9.4  9.7  9.6    Chest imaging: CTA Chest 05/24/22 Mediastinum/Nodes: No enlarged nodes. Esophagus is unremarkable. Thyroid is poorly evaluated due to streak artifact.   Lungs/Pleura: Emphysema. Patchy interstitial thickening, greatest in the right upper lobe. No pleural effusion or pneumothorax.  CXR 05/27/22 Improved aeration of left lung, aortic atherosclerosis  PFT:    Latest Ref Rng & Units 11/25/2022    4:01 PM  PFT Results  FVC-Pre L 1.14  FVC-Predicted Pre % 37   FVC-Post L 1.14   FVC-Predicted Post % 37   Pre FEV1/FVC % % 36   Post FEV1/FCV % % 37   FEV1-Pre L 0.42   FEV1-Predicted Pre % 17   FEV1-Post L 0.42   DLCO uncorrected ml/min/mmHg 4.41   DLCO UNC% % 23   DLCO corrected ml/min/mmHg 4.40   DLCO COR %Predicted % 23   DLVA Predicted % 31   TLC L 6.07   TLC % Predicted % 129   RV % Predicted % 267    Labs:  Path:  Echo 05/24/2022: LV EF 60-65%. Grade I diastolic  dysfunction. RV systolic function is normal. RV size is normal.   Heart Catheterization:     Assessment & Plan:   Nocturnal hypoxemia - Plan: Ambulatory Referral for DME  Centrilobular emphysema (HCC) - Plan: AMB referral to pulmonary rehabilitation  Discussion: Sway Mangar is a 57 year old woman, daily smoker with fibromyalgia, reynauds syndrome, neuropathy, and PTSD/anxiety disorder who returns to pulmonary clinic for COPD.  She is to continue on ICS/LAMA/LABA therapy with advair diskus and spiriva inhalers. She can continue albuterol inhaler as needed for mucous clearance.   Her PFTs show severe obstruction.    We will place order to discontinue her home oxygen equipment and supplies.  Follow up in 6 months.  Melody Comas, MD [facility] Office: [phone]    Current Outpatient Medications:    albuterol (PROVENTIL HFA;VENTOLIN HFA) 108 (90 Base) MCG/ACT inhaler, Inhale 2 puffs into the lungs 5 (five) times daily as needed for wheezing or shortness of breath. , Disp: , Rfl:    albuterol (PROVENTIL) (2.5 MG/3ML) 0.083% nebulizer solution, Take 3 mLs (2.5 mg total) by nebulization every 6 (six) hours as needed for wheezing or shortness of breath., Disp: 75 mL, Rfl: 12   benzonatate (TESSALON) 200 MG capsule, Take 1 capsule (200 mg total) by mouth 3 (three) times daily as needed for cough., Disp: 20 capsule, Rfl: 0   fluticasone-salmeterol (WIXELA INHUB) 500-50 MCG/ACT AEPB, Inhale 1 puff into the lungs in the morning and at bedtime., Disp: , Rfl:    ipratropium-albuterol (DUONEB) 0.5-2.5 (3) MG/3ML SOLN, Take 3 mLs by nebulization every 4 (four) hours as needed., Disp: 360 mL, Rfl: 1   lactose free nutrition (BOOST PLUS) LIQD, Take 237 mLs by mouth 3 (three) times daily with meals., Disp: 2370 mL, Rfl: 0   mirtazapine (REMERON) 15 MG tablet, Take 15 mg by mouth at bedtime., Disp: , Rfl:    Multiple Vitamin (MULTIVITAMIN WITH MINERALS) TABS tablet, Take  1 tablet by mouth daily., Disp: 30 tablet, Rfl: 0   oxyCODONE (OXY IR/ROXICODONE) 5 MG immediate release tablet, Take 5 mg by mouth every 4 (four) hours as needed for severe pain., Disp: , Rfl:    Tiotropium Bromide Monohydrate (SPIRIVA RESPIMAT) 1.25 MCG/ACT AERS, Inhale 2 each into the lungs daily., Disp: , Rfl:

## 2023-05-14 ENCOUNTER — Telehealth: Payer: Self-pay | Admitting: Pulmonary Disease

## 2023-05-14 NOTE — Telephone Encounter (Signed)
Francena Hanly would like office notes. Francena Hanly phone number is [phone]. Fax number is [phone].

## 2023-05-15 NOTE — Telephone Encounter (Signed)
OV notes have been faxed to the [place]. NFN

## 2023-06-12 ENCOUNTER — Emergency Department (HOSPITAL_COMMUNITY): Payer: No Typology Code available for payment source

## 2023-06-12 ENCOUNTER — Inpatient Hospital Stay (HOSPITAL_COMMUNITY)
Admission: EM | Admit: 2023-06-12 | Discharge: 2023-06-15 | DRG: 190 | Disposition: A | Discharge status: Home / Self Care | Payer: No Typology Code available for payment source | Attending: Family Medicine | Admitting: Family Medicine

## 2023-06-12 ENCOUNTER — Encounter (HOSPITAL_COMMUNITY): Payer: Self-pay | Admitting: Emergency Medicine

## 2023-06-12 DIAGNOSIS — F17210 Nicotine dependence, cigarettes, uncomplicated: Secondary | ICD-10-CM | POA: Diagnosis present

## 2023-06-12 DIAGNOSIS — R079 Chest pain, unspecified: Secondary | ICD-10-CM | POA: Diagnosis not present

## 2023-06-12 DIAGNOSIS — R042 Hemoptysis: Secondary | ICD-10-CM

## 2023-06-12 DIAGNOSIS — Z833 Family history of diabetes mellitus: Secondary | ICD-10-CM

## 2023-06-12 DIAGNOSIS — J9601 Acute respiratory failure with hypoxia: Secondary | ICD-10-CM | POA: Diagnosis present

## 2023-06-12 DIAGNOSIS — M797 Fibromyalgia: Secondary | ICD-10-CM | POA: Diagnosis present

## 2023-06-12 DIAGNOSIS — I700 Atherosclerosis of aorta: Secondary | ICD-10-CM | POA: Diagnosis not present

## 2023-06-12 DIAGNOSIS — J441 Chronic obstructive pulmonary disease with (acute) exacerbation: Principal | ICD-10-CM | POA: Diagnosis present

## 2023-06-12 DIAGNOSIS — Z888 Allergy status to other drugs, medicaments and biological substances status: Secondary | ICD-10-CM

## 2023-06-12 DIAGNOSIS — J439 Emphysema, unspecified: Secondary | ICD-10-CM | POA: Diagnosis present

## 2023-06-12 DIAGNOSIS — R0602 Shortness of breath: Secondary | ICD-10-CM | POA: Diagnosis not present

## 2023-06-12 DIAGNOSIS — Z1152 Encounter for screening for COVID-19: Secondary | ICD-10-CM

## 2023-06-12 DIAGNOSIS — Z79899 Other long term (current) drug therapy: Secondary | ICD-10-CM

## 2023-06-12 DIAGNOSIS — Z91014 Allergy to mammalian meats: Secondary | ICD-10-CM

## 2023-06-12 DIAGNOSIS — F4310 Post-traumatic stress disorder, unspecified: Secondary | ICD-10-CM | POA: Diagnosis present

## 2023-06-12 DIAGNOSIS — Z720 Tobacco use: Secondary | ICD-10-CM | POA: Diagnosis present

## 2023-06-12 DIAGNOSIS — Z880 Allergy status to penicillin: Secondary | ICD-10-CM

## 2023-06-12 DIAGNOSIS — I5032 Chronic diastolic (congestive) heart failure: Secondary | ICD-10-CM | POA: Diagnosis present

## 2023-06-12 DIAGNOSIS — F419 Anxiety disorder, unspecified: Secondary | ICD-10-CM | POA: Diagnosis present

## 2023-06-12 DIAGNOSIS — K219 Gastro-esophageal reflux disease without esophagitis: Secondary | ICD-10-CM | POA: Diagnosis present

## 2023-06-12 DIAGNOSIS — K2970 Gastritis, unspecified, without bleeding: Secondary | ICD-10-CM | POA: Diagnosis present

## 2023-06-12 DIAGNOSIS — M549 Dorsalgia, unspecified: Secondary | ICD-10-CM | POA: Diagnosis present

## 2023-06-12 DIAGNOSIS — G8929 Other chronic pain: Secondary | ICD-10-CM | POA: Diagnosis present

## 2023-06-12 MED ORDER — ALBUTEROL SULFATE (2.5 MG/3ML) 0.083% IN NEBU: 5.0000 mg/h | INHALATION_SOLUTION | Freq: Once | RESPIRATORY_TRACT | Status: DC

## 2023-06-12 MED ORDER — METHYLPREDNISOLONE SODIUM SUCC 125 MG IJ SOLR
125.0000 mg | Freq: Once | INTRAMUSCULAR | Status: AC
  Administered 2023-06-13: 125 mg via INTRAVENOUS
  Filled 2023-06-12: qty 2

## 2023-06-12 NOTE — ED Provider Notes (Signed)
[place] EMERGENCY DEPARTMENT AT [facility] Provider Note   CSN: [id number] Arrival date & time: 06/12/23  2257     History  Chief Complaint  Patient presents with   Chest Pain   Shortness of Breath    Michele Lucas is a 57 y.o. female.  HPI   Patient with medical history including diastolic heart failure, COPD, fibromyalgia, emphysema, current smoker, presenting with concerns of bloody sputum, started yesterday, states has been constant, she endorses worsening sputum production, with wheezing and chest tightness, she denies any fevers chills general body aches denies any recent sick contacts, no cardiac history, no history of PEs or DVTs no recent surgeries no long immobilizations.  She states that if she did not have the bloody sputum she probably would not of come in, she is just concerned about that.  Patient states that she has used her inhalers without much relief, she is currently not on supplemental oxygen, states it was removed about 1 month ago by her pulmonologist.  Home Medications Prior to Admission medications   Medication Sig Start Date End Date Taking? Authorizing Provider  albuterol (PROVENTIL HFA;VENTOLIN HFA) 108 (90 Base) MCG/ACT inhaler Inhale 2 puffs into the lungs 5 (five) times daily as needed for wheezing or shortness of breath.     [provider]  albuterol (PROVENTIL) (2.5 MG/3ML) 0.083% nebulizer solution Take 3 mLs (2.5 mg total) by nebulization every 6 (six) hours as needed for wheezing or shortness of breath. 01/07/22   Dartha Lodge, PA-C  benzonatate (TESSALON) 200 MG capsule Take 1 capsule (200 mg total) by mouth 3 (three) times daily as needed for cough. 05/27/22   Sheikh, Omair Latif, DO  fluticasone-salmeterol (WIXELA INHUB) 500-50 MCG/ACT AEPB Inhale 1 puff into the lungs in the morning and at bedtime.    [provider]  ipratropium-albuterol (DUONEB) 0.5-2.5 (3) MG/3ML SOLN Take 3 mLs by nebulization every 4 (four)  hours as needed. 07/16/22   Martina Sinner, MD  lactose free nutrition (BOOST PLUS) LIQD Take 237 mLs by mouth 3 (three) times daily with meals. 05/27/22   Marguerita Merles Latif, DO  mirtazapine (REMERON) 15 MG tablet Take 15 mg by mouth at bedtime. 04/01/23   [provider]  Multiple Vitamin (MULTIVITAMIN WITH MINERALS) TABS tablet Take 1 tablet by mouth daily. 05/28/22   Marguerita Merles Latif, DO  oxyCODONE (OXY IR/ROXICODONE) 5 MG immediate release tablet Take 5 mg by mouth every 4 (four) hours as needed for severe pain.    [provider]  Tiotropium Bromide Monohydrate (SPIRIVA RESPIMAT) 1.25 MCG/ACT AERS Inhale 2 each into the lungs daily.    [provider]      Allergies    Guaifenesin & derivatives, Cymbalta [duloxetine hcl], Ibuprofen, Neurontin [gabapentin], Pork-derived products, Prozac [fluoxetine hcl], Tylenol [acetaminophen], Ampicillin, Clindamycin/lincomycin, and Penicillins    Review of Systems   Review of Systems  Constitutional:  Negative for chills and fever.  Respiratory:  Positive for cough, shortness of breath and wheezing.   Cardiovascular:  Negative for chest pain.  Gastrointestinal:  Negative for abdominal pain.  Neurological:  Negative for headaches.    Physical Exam Updated Vital Signs BP 131/81 (BP Location: Left Arm)   Pulse (!) 121   Temp 98.7 F (37.1 C) (Oral)   Resp 20   Ht 5\' 2"  (1.575 m)   Wt 43.1 kg   SpO2 (!) 83%   BMI 17.38 kg/m  Physical Exam Vitals and nursing note reviewed.  Constitutional:      General: She is not in acute distress.    Appearance: She is not ill-appearing.  HENT:     Head: Normocephalic and atraumatic.     Nose: No congestion.  Eyes:     Conjunctiva/sclera: Conjunctivae normal.  Cardiovascular:     Rate and Rhythm: Regular rhythm. Tachycardia present.     Pulses: Normal pulses.     Heart sounds: No murmur heard.    No friction rub. No gallop.  Pulmonary:     Effort: No respiratory  distress.     Breath sounds: Wheezing and rhonchi present. No rales.     Comments: No evidence of respiratory distress, nontachypneic, nonhypoxic, she was satting in the low 90s while on room air, but able to speak in full sentences, patient has coarse sounding lungs, noted rhonchi, with expiratory wheezing, no Rales present. Abdominal:     Palpations: Abdomen is soft.     Tenderness: There is no abdominal tenderness. There is no right CVA tenderness or left CVA tenderness.  Musculoskeletal:     Comments: No unilateral leg swelling no calf tenderness no palpable cords.  Skin:    General: Skin is warm and dry.  Neurological:     Mental Status: She is alert.  Psychiatric:        Mood and Affect: Mood normal.     ED Results / Procedures / Treatments   Labs (all labs ordered are listed, but only abnormal results are displayed) Labs Reviewed  RESP PANEL BY RT-PCR (RSV, FLU A&B, COVID)  [id number]  COMPREHENSIVE METABOLIC PANEL  CBC WITH DIFFERENTIAL/PLATELET  TROPONIN I (HIGH SENSITIVITY)    EKG None  Radiology No results found.  Procedures .Critical Care  Performed by: Carroll Sage, PA-C Authorized by: Carroll Sage, PA-C   Critical care provider statement:    Critical care time (minutes):  30   Critical care was necessary to treat or prevent imminent or life-threatening deterioration of the following conditions:  Respiratory failure   Critical care was time spent personally by me on the following activities:  Development of treatment plan with patient or surrogate, discussions with consultants, evaluation of patient's response to treatment, examination of patient, ordering and review of laboratory studies, ordering and review of radiographic studies, ordering and performing treatments and interventions, pulse oximetry, re-evaluation of patient's condition and review of old charts   I assumed direction of critical care for this patient from another provider in my  specialty: no     Care discussed with: admitting provider       Medications Ordered in ED Medications  albuterol (PROVENTIL,VENTOLIN) solution continuous neb (has no administration in time range)  methylPREDNISolone sodium succinate (SOLU-MEDROL) 125 mg/2 mL injection 125 mg (has no administration in time range)    ED Course/ Medical Decision Making/ A&P                             Medical Decision Making Amount and/or Complexity of Data Reviewed Labs: ordered. Radiology: ordered.  Risk Prescription drug management. Decision regarding hospitalization.   This patient presents to the ED for concern of bloody sputum, this involves an extensive number of treatment options, and is a complaint that carries with it a high risk of complications and morbidity.  The differential diagnosis includes URI, COPD, malignancy, PE, ACS,    Additional history obtained:  Additional history obtained from N/A External records from outside source obtained and  reviewed including pulmonology notes   Co morbidities that complicate the patient evaluation  COPD, emphysema  Social Determinants of Health:  Current smoker    Lab Tests:  I Ordered, and personally interpreted labs.  The pertinent results include: CBC is unremarkable, CMP shows calcium 8.6, for troponin is 5, respiratory panel negative,   Imaging Studies ordered:  I ordered imaging studies including chest x-ray, CT of chest I independently visualized and interpreted imaging which showed chest x-ray negative for acute findings and I I agree with the radiologist interpretation   Cardiac Monitoring:  The patient was maintained on a cardiac monitor.  I personally viewed and interpreted the cardiac monitored which showed an underlying rhythm of: Without signs of ischemia   Medicines ordered and prescription drug management:  I ordered medication including bronchodilators I have reviewed the patients home medicines and have  made adjustments as needed  Critical Interventions:  Patient comes hypoxic on exam, currently on 2 L via nasal cannula Reassessed currently on 2 L maintaining the upper 90s.   Reevaluation:  Presents with bloody sputum, on my exam O2 sat are in the mid to low 90s, patient is tight sounding chest, seems consistent likely COPD exacerbation, will provide bronchodilators, obtain screening lab workup, imaging and reassess.  Chest x-ray is on remarkable, lab work is unremarkable, she is still requiring oxygen, as well as having bloody emesis, I am concerned for possible PE, will send down for PE study for further assessment.  CT scan is negative, suspect patient suffered from COPD exacerbation, will admit for respiratory failure secondary due to respiratory failure.  Consultations Obtained:  I requested consultation with the Dr. Phillips Odor,  and discussed lab and imaging findings as well as pertinent plan - they recommend: He will admit the patient.    Test Considered:  N/A    Rule out I have low suspicion for ACS as history is atypical, patient has no cardiac history, EKG was sinus rhythm without signs of ischemia, patient had a delta troponin.  Low suspicion for PE cta is negative.  Low suspicion for AAA or aortic dissection as history is atypical, patient has low risk factors.  Low suspicion for systemic infection as patient is nontoxic-appearing, vital signs reassuring, no obvious source infection noted on exam, will defer antibiotics as she is nontoxic-appearing, afebrile, no leukocytosis.     Dispostion and problem list  After consideration of the diagnostic results and the patients response to treatment, I feel that the patent would benefit from admission.  Acute respiratory failure-likely secondary to COPD exacerbation, patiently continued bronchodilators and steroids and transition to room air.            Final Clinical Impression(s) / ED Diagnoses Final diagnoses:   None    Rx / DC Orders ED Discharge Orders     None         Carroll Sage, PA-C 06/13/23 0445    Palumbo, April, MD 06/13/23 0500

## 2023-06-12 NOTE — ED Triage Notes (Signed)
Pt has COPD and been coughing up blood newly today. Sats 82% on RA. Saw pulmonologist and they took away her oxygen stating she no longer needed it. Came and got last month. She is tachypnea and having chest pain. Talking in full sentences.

## 2023-06-13 ENCOUNTER — Other Ambulatory Visit: Payer: Self-pay

## 2023-06-13 ENCOUNTER — Encounter (HOSPITAL_COMMUNITY): Payer: Self-pay

## 2023-06-13 ENCOUNTER — Emergency Department (HOSPITAL_COMMUNITY): Payer: No Typology Code available for payment source

## 2023-06-13 DIAGNOSIS — F419 Anxiety disorder, unspecified: Secondary | ICD-10-CM | POA: Diagnosis present

## 2023-06-13 DIAGNOSIS — Z720 Tobacco use: Secondary | ICD-10-CM

## 2023-06-13 DIAGNOSIS — J439 Emphysema, unspecified: Secondary | ICD-10-CM | POA: Diagnosis not present

## 2023-06-13 DIAGNOSIS — R079 Chest pain, unspecified: Secondary | ICD-10-CM | POA: Diagnosis not present

## 2023-06-13 DIAGNOSIS — J441 Chronic obstructive pulmonary disease with (acute) exacerbation: Secondary | ICD-10-CM | POA: Diagnosis present

## 2023-06-13 DIAGNOSIS — R042 Hemoptysis: Secondary | ICD-10-CM | POA: Diagnosis not present

## 2023-06-13 DIAGNOSIS — Z79899 Other long term (current) drug therapy: Secondary | ICD-10-CM | POA: Diagnosis not present

## 2023-06-13 DIAGNOSIS — G8929 Other chronic pain: Secondary | ICD-10-CM | POA: Diagnosis present

## 2023-06-13 DIAGNOSIS — I700 Atherosclerosis of aorta: Secondary | ICD-10-CM | POA: Diagnosis not present

## 2023-06-13 DIAGNOSIS — Z1152 Encounter for screening for COVID-19: Secondary | ICD-10-CM | POA: Diagnosis not present

## 2023-06-13 DIAGNOSIS — J9601 Acute respiratory failure with hypoxia: Secondary | ICD-10-CM

## 2023-06-13 DIAGNOSIS — Z91014 Allergy to mammalian meats: Secondary | ICD-10-CM | POA: Diagnosis not present

## 2023-06-13 DIAGNOSIS — R1013 Epigastric pain: Secondary | ICD-10-CM | POA: Diagnosis not present

## 2023-06-13 DIAGNOSIS — K219 Gastro-esophageal reflux disease without esophagitis: Secondary | ICD-10-CM | POA: Diagnosis present

## 2023-06-13 DIAGNOSIS — Z880 Allergy status to penicillin: Secondary | ICD-10-CM | POA: Diagnosis not present

## 2023-06-13 DIAGNOSIS — K2970 Gastritis, unspecified, without bleeding: Secondary | ICD-10-CM | POA: Diagnosis present

## 2023-06-13 DIAGNOSIS — Z888 Allergy status to other drugs, medicaments and biological substances status: Secondary | ICD-10-CM | POA: Diagnosis not present

## 2023-06-13 DIAGNOSIS — I5032 Chronic diastolic (congestive) heart failure: Secondary | ICD-10-CM | POA: Diagnosis present

## 2023-06-13 DIAGNOSIS — F17210 Nicotine dependence, cigarettes, uncomplicated: Secondary | ICD-10-CM | POA: Diagnosis present

## 2023-06-13 DIAGNOSIS — M797 Fibromyalgia: Secondary | ICD-10-CM | POA: Diagnosis present

## 2023-06-13 DIAGNOSIS — F4310 Post-traumatic stress disorder, unspecified: Secondary | ICD-10-CM | POA: Diagnosis present

## 2023-06-13 DIAGNOSIS — M549 Dorsalgia, unspecified: Secondary | ICD-10-CM | POA: Diagnosis present

## 2023-06-13 DIAGNOSIS — Z833 Family history of diabetes mellitus: Secondary | ICD-10-CM | POA: Diagnosis not present

## 2023-06-13 LAB — CBC WITH DIFFERENTIAL/PLATELET
Abs Immature Granulocytes: 0.01 10*3/uL (ref 0.00–0.07)
Basophils Absolute: 0 10*3/uL (ref 0.0–0.1)
Basophils Relative: 1 %
Eosinophils Absolute: 0.2 10*3/uL (ref 0.0–0.5)
Eosinophils Relative: 3 %
HCT: 44 % (ref 36.0–46.0)
Hemoglobin: 13.4 g/dL (ref 12.0–15.0)
Immature Granulocytes: 0 %
Lymphocytes Relative: 34 %
Lymphs Abs: 2.3 10*3/uL (ref 0.7–4.0)
MCH: 31.2 pg (ref 26.0–34.0)
MCHC: 30.5 g/dL (ref 30.0–36.0)
MCV: 102.3 fL — ABNORMAL HIGH (ref 80.0–100.0)
Monocytes Absolute: 0.6 10*3/uL (ref 0.1–1.0)
Monocytes Relative: 9 %
Neutro Abs: 3.5 10*3/uL (ref 1.7–7.7)
Neutrophils Relative %: 53 %
Platelets: 115 10*3/uL — ABNORMAL LOW (ref 150–400)
RBC: 4.3 MIL/uL (ref 3.87–5.11)
RDW: 13.1 % (ref 11.5–15.5)
WBC: 6.6 10*3/uL (ref 4.0–10.5)
nRBC: 0 % (ref 0.0–0.2)

## 2023-06-13 LAB — COMPREHENSIVE METABOLIC PANEL
ALT: 14 U/L (ref 0–44)
AST: 18 U/L (ref 15–41)
Albumin: 3.7 g/dL (ref 3.5–5.0)
Alkaline Phosphatase: 52 U/L (ref 38–126)
Anion gap: 6 (ref 5–15)
BUN: 12 mg/dL (ref 6–20)
CO2: 30 mmol/L (ref 22–32)
Calcium: 8.6 mg/dL — ABNORMAL LOW (ref 8.9–10.3)
Chloride: 101 mmol/L (ref 98–111)
Creatinine, Ser: 0.92 mg/dL (ref 0.44–1.00)
GFR, Estimated: >60 mL/min (ref >60)
Glucose, Bld: 88 mg/dL (ref 70–99)
Potassium: 4 mmol/L (ref 3.5–5.1)
Sodium: 137 mmol/L (ref 135–145)
Total Bilirubin: 0.6 mg/dL (ref 0.3–1.2)
Total Protein: 6.6 g/dL (ref 6.5–8.1)

## 2023-06-13 LAB — BASIC METABOLIC PANEL
Anion gap: 10 (ref 5–15)
BUN: 12 mg/dL (ref 6–20)
CO2: 28 mmol/L (ref 22–32)
Calcium: 9.3 mg/dL (ref 8.9–10.3)
Chloride: 100 mmol/L (ref 98–111)
Creatinine, Ser: 0.94 mg/dL (ref 0.44–1.00)
GFR, Estimated: >60 mL/min (ref >60)
Glucose, Bld: 137 mg/dL — ABNORMAL HIGH (ref 70–99)
Potassium: 4.4 mmol/L (ref 3.5–5.1)
Sodium: 138 mmol/L (ref 135–145)

## 2023-06-13 LAB — RESP PANEL BY RT-PCR (RSV, FLU A&B, COVID)  RVPGX2
Influenza A by PCR: NEGATIVE
Influenza B by PCR: NEGATIVE
Resp Syncytial Virus by PCR: NEGATIVE
SARS Coronavirus 2 by RT PCR: NEGATIVE

## 2023-06-13 LAB — TROPONIN I (HIGH SENSITIVITY): Troponin I (High Sensitivity): 5 ng/L (ref <18)

## 2023-06-13 LAB — HIV ANTIBODY (ROUTINE TESTING W REFLEX): HIV Screen 4th Generation wRfx: NONREACTIVE

## 2023-06-13 MED ORDER — BOOST PLUS PO LIQD
237.0000 mL | Freq: Three times a day (TID) | ORAL | Status: DC
  Administered 2023-06-13 – 2023-06-15 (×6): 237 mL via ORAL
  Filled 2023-06-13 (×9): qty 237

## 2023-06-13 MED ORDER — BENZONATATE 100 MG PO CAPS
200.0000 mg | ORAL_CAPSULE | Freq: Three times a day (TID) | ORAL | Status: DC | PRN
  Administered 2023-06-14 – 2023-06-15 (×2): 200 mg via ORAL
  Filled 2023-06-13 (×2): qty 2

## 2023-06-13 MED ORDER — MAGNESIUM HYDROXIDE 400 MG/5ML PO SUSP: 30.0000 mL | Freq: Every day | ORAL | Status: DC | PRN

## 2023-06-13 MED ORDER — ONDANSETRON HCL 4 MG PO TABS: 4.0000 mg | ORAL_TABLET | Freq: Four times a day (QID) | ORAL | Status: DC | PRN

## 2023-06-13 MED ORDER — ONDANSETRON HCL 4 MG/2ML IJ SOLN
4.0000 mg | Freq: Four times a day (QID) | INTRAMUSCULAR | Status: DC | PRN
  Administered 2023-06-14: 4 mg via INTRAVENOUS
  Filled 2023-06-13: qty 2

## 2023-06-13 MED ORDER — SODIUM CHLORIDE 0.9 % IV SOLN: INTRAVENOUS | Status: DC

## 2023-06-13 MED ORDER — OXYCODONE HCL 5 MG PO TABS
5.0000 mg | ORAL_TABLET | Freq: Four times a day (QID) | ORAL | Status: DC | PRN
  Administered 2023-06-13 – 2023-06-14 (×4): 5 mg via ORAL
  Filled 2023-06-13 (×4): qty 1

## 2023-06-13 MED ORDER — IPRATROPIUM-ALBUTEROL 0.5-2.5 (3) MG/3ML IN SOLN: 3.0000 mL | RESPIRATORY_TRACT | Status: DC | PRN

## 2023-06-13 MED ORDER — OXYCODONE HCL 5 MG PO TABS
5.0000 mg | ORAL_TABLET | Freq: Once | ORAL | Status: AC
  Administered 2023-06-13: 5 mg via ORAL
  Filled 2023-06-13: qty 1

## 2023-06-13 MED ORDER — MAGNESIUM OXIDE -MG SUPPLEMENT 400 (240 MG) MG PO TABS
400.0000 mg | ORAL_TABLET | Freq: Two times a day (BID) | ORAL | Status: DC
  Administered 2023-06-13 – 2023-06-15 (×5): 400 mg via ORAL
  Filled 2023-06-13 (×5): qty 1

## 2023-06-13 MED ORDER — ALBUTEROL SULFATE (2.5 MG/3ML) 0.083% IN NEBU
5.0000 mg | INHALATION_SOLUTION | Freq: Once | RESPIRATORY_TRACT | Status: AC
  Administered 2023-06-13: 5 mg via RESPIRATORY_TRACT
  Filled 2023-06-13: qty 6

## 2023-06-13 MED ORDER — SODIUM CHLORIDE 0.9 % IV SOLN
1.0000 g | INTRAVENOUS | Status: DC
  Administered 2023-06-13 – 2023-06-14 (×2): 1 g via INTRAVENOUS
  Filled 2023-06-13 (×2): qty 10

## 2023-06-13 MED ORDER — IPRATROPIUM-ALBUTEROL 0.5-2.5 (3) MG/3ML IN SOLN
3.0000 mL | Freq: Four times a day (QID) | RESPIRATORY_TRACT | Status: DC
  Administered 2023-06-13: 3 mL via RESPIRATORY_TRACT
  Filled 2023-06-13: qty 3

## 2023-06-13 MED ORDER — PREDNISONE 20 MG PO TABS
40.0000 mg | ORAL_TABLET | Freq: Every day | ORAL | Status: DC
  Administered 2023-06-14: 40 mg via ORAL
  Filled 2023-06-13: qty 2

## 2023-06-13 MED ORDER — TRAZODONE HCL 50 MG PO TABS: 25.0000 mg | ORAL_TABLET | Freq: Every evening | ORAL | Status: DC | PRN

## 2023-06-13 MED ORDER — UMECLIDINIUM BROMIDE 62.5 MCG/ACT IN AEPB
1.0000 | INHALATION_SPRAY | Freq: Every day | RESPIRATORY_TRACT | Status: DC
  Administered 2023-06-13 – 2023-06-14 (×2): 1 via RESPIRATORY_TRACT
  Filled 2023-06-13: qty 7

## 2023-06-13 MED ORDER — METHYLPREDNISOLONE SODIUM SUCC 40 MG IJ SOLR
40.0000 mg | Freq: Two times a day (BID) | INTRAMUSCULAR | Status: AC
  Administered 2023-06-13 (×2): 40 mg via INTRAVENOUS
  Filled 2023-06-13 (×2): qty 1

## 2023-06-13 MED ORDER — ENOXAPARIN SODIUM 40 MG/0.4ML IJ SOSY: 40.0000 mg | PREFILLED_SYRINGE | INTRAMUSCULAR | Status: DC

## 2023-06-13 MED ORDER — ACETAMINOPHEN 650 MG RE SUPP
650.0000 mg | Freq: Four times a day (QID) | RECTAL | Status: DC | PRN
Start: 2023-06-13 — End: 2023-06-13

## 2023-06-13 MED ORDER — BENZONATATE 100 MG PO CAPS
200.0000 mg | ORAL_CAPSULE | Freq: Once | ORAL | Status: AC
  Administered 2023-06-13: 200 mg via ORAL
  Filled 2023-06-13: qty 2

## 2023-06-13 MED ORDER — TIOTROPIUM BROMIDE MONOHYDRATE 1.25 MCG/ACT IN AERS: 2.0000 | INHALATION_SPRAY | Freq: Every day | RESPIRATORY_TRACT | Status: DC

## 2023-06-13 MED ORDER — SODIUM CHLORIDE 0.9 % IV SOLN
500.0000 mg | INTRAVENOUS | Status: DC
  Administered 2023-06-13 – 2023-06-14 (×2): 500 mg via INTRAVENOUS
  Filled 2023-06-13 (×2): qty 5

## 2023-06-13 MED ORDER — MIRTAZAPINE 7.5 MG PO TABS
15.0000 mg | ORAL_TABLET | Freq: Once | ORAL | Status: AC
  Administered 2023-06-13: 15 mg via ORAL
  Filled 2023-06-13: qty 2

## 2023-06-13 MED ORDER — SODIUM CHLORIDE (PF) 0.9 % IJ SOLN
INTRAMUSCULAR | Status: AC
  Filled 2023-06-13: qty 50

## 2023-06-13 MED ORDER — IOHEXOL 350 MG/ML SOLN
75.0000 mL | Freq: Once | INTRAVENOUS | Status: AC | PRN
  Administered 2023-06-13: 75 mL via INTRAVENOUS

## 2023-06-13 MED ORDER — IPRATROPIUM-ALBUTEROL 0.5-2.5 (3) MG/3ML IN SOLN
3.0000 mL | Freq: Two times a day (BID) | RESPIRATORY_TRACT | Status: DC
  Administered 2023-06-13 – 2023-06-14 (×2): 3 mL via RESPIRATORY_TRACT
  Filled 2023-06-13 (×2): qty 3

## 2023-06-13 MED ORDER — ACETAMINOPHEN 325 MG PO TABS
650.00 mg | ORAL_TABLET | Freq: Four times a day (QID) | ORAL | Status: DC | PRN
Start: 2023-06-13 — End: 2023-06-13

## 2023-06-13 MED ORDER — HYDROXYZINE HCL 25 MG PO TABS
25.0000 mg | ORAL_TABLET | Freq: Three times a day (TID) | ORAL | Status: DC
  Administered 2023-06-13 – 2023-06-15 (×7): 25 mg via ORAL
  Filled 2023-06-13 (×7): qty 1

## 2023-06-13 NOTE — Assessment & Plan Note (Signed)
I counseled her for smoking cessation and she will receive further counseling here. ?

## 2023-06-13 NOTE — Progress Notes (Signed)
Mobility Specialist - Progress Note   06/13/23 1356  Mobility  Activity Ambulated with assistance in hallway  Level of Assistance Modified independent, requires aide device or extra time  Assistive Device None  Distance Ambulated (ft) 600 ft  Range of Motion/Exercises Active  Activity Response Tolerated well  Mobility Referral Yes  $Mobility charge 1 Mobility  Mobility Specialist Start Time (ACUTE ONLY) 1345  Mobility Specialist Stop Time (ACUTE ONLY) 1355  Mobility Specialist Time Calculation (min) (ACUTE ONLY) 10 min   Pt received in bed and agreed to mobility. Had no issues.   Pt was on 3L and was at 91% throughout session.  Returned to bed with all needs met.  Marilynne Halsted Mobility Specialist

## 2023-06-13 NOTE — Assessment & Plan Note (Addendum)
-   The patient will be admitted to a medically monitored bed. - We will place the patient IV steroid therapy with IV Solu-Medrol as well as nebulized bronchodilator therapy with duonebs q.i.d. and q.4 hours p.r.n.Marland Kitchen - Mucolytic therapy will be provided with Mucinex and antibiotic therapy with IV Rocephin and Zithromax. - O2 protocol will be followed. - We will hold off long-acting beta agonist. - We will continue Spiriva.

## 2023-06-13 NOTE — ED Notes (Signed)
Pt desat to 84% on RA, PA notified

## 2023-06-13 NOTE — Assessment & Plan Note (Signed)
-   Management as above. - She will benefit from pulmonology consult that can be called in AM.

## 2023-06-13 NOTE — H&P (Signed)
Michele Lucas   PATIENT NAME: Michele Lucas    MR#:  [id number]  DATE OF BIRTH:  [birth year]  DATE OF ADMISSION:  06/12/2023  PRIMARY CARE PHYSICIAN: Clare Gandy, MD   Patient is coming from: Home  REQUESTING/REFERRING PHYSICIAN: Carroll Sage, PA-C    CHIEF COMPLAINT:   Chief Complaint  Patient presents with   Chest Pain   Shortness of Breath    HISTORY OF PRESENT ILLNESS:  Michele Lucas is a 57 y.o. African-American female with medical history significant for COPD, ongoing tobacco abuse, grade 1 diastolic dysfunction, PTSD, chronic back pain, anxiety and fibromyalgia, presented to the emergency room with acute onset of worsening dyspnea with associated cough and wheezing as well as incidental hemoptysis while coughing.  She denied chest pain felt this tightness with her cough.  No fever or chills.  No nausea or vomiting or abdominal pain currently.  She had left lower quadrant abdominal pain yesterday that was thought to be related to muscle strain after bending to get something.  No dysuria, oliguria or hematuria or flank pain.  ED Course: When she came to the ER, heart rate was 121 with a pulse currently of 83% on room air and 97% on 2 L of O2 by nasal cannula with otherwise normal vital signs.  Labs revealed calcium of 8.6 with unremarkable CMP otherwise.  CBC showed macrocytosis.  Respiratory panel with influenza antigens, RSV PCR and COVID-19 PCR came back negative. EKG as reviewed by me : EKG showed sinus rhythm with a rate of 99 with right atrial enlargement and borderline prolonged QT interval with QTc 489 MS. Imaging: 2 view chest x-ray came back normal.  Chest CTA revealed no PE and no acute abnormality.  It showed emphysema and aortic atherosclerosis.  The patient was given 125 mg of IV Solu-Medrol, 5 mg nebulized albuterol, Tessalon 200 mg and Remeron 15 mg as well as oxycodone 5 mg.  She will be admitted to a medical telemetry bed for further evaluation and  management PAST MEDICAL HISTORY:   Past Medical History:  Diagnosis Date   Anxiety    Chronic back pain    COPD, mild (HCC)    Emphysema lung (HCC)    Endometriosis    Fibromyalgia    Grade I diastolic dysfunction 11/26/2021   Kidney stones    Neuropathic pain    PTSD (post-traumatic stress disorder)     PAST SURGICAL HISTORY:   Past Surgical History:  Procedure Laterality Date   CESAREAN SECTION     copolscopy     TONSILLECTOMY     TUBAL LIGATION      SOCIAL HISTORY:   Social History   Tobacco Use   Smoking status: Former    Packs/day: .5    Types: Cigarettes    Quit date: 11/25/2021    Years since quitting: 1.5   Smokeless tobacco: Current   Tobacco comments:    Previously documented in a pack per day-she now reports 4 to 5 cigarettes/day  Substance Use Topics   Alcohol use: No  He smokes when she is stressed out with average of 5 to 10 cigarettes/day.  FAMILY HISTORY:   Family History  Problem Relation Age of Onset   Diabetes type II Other    Stroke Neg Hx     DRUG ALLERGIES:   Allergies  Allergen Reactions   Guaifenesin & Derivatives Shortness Of Breath    "Mucinex brand"   Cymbalta [Duloxetine Hcl] Other (See Comments)  Unk reaction   Ibuprofen     Upset stomach   Neurontin [Gabapentin] Other (See Comments)    Reported by [facility] - slowed heart rate per pt   Pork-Derived Products     Patient prefers to not eat any pork/swine    Prozac [Fluoxetine Hcl] Other (See Comments)    Loss of appetite, sedation   Tylenol [Acetaminophen]     Upset stomach   Ampicillin Hives and Rash    Has patient had a PCN reaction causing immediate rash, facial/tongue/throat swelling, SOB or lightheadedness with hypotension: Yes Has patient had a PCN reaction causing severe rash involving mucus membranes or skin necrosis: No Has patient had a PCN reaction that required hospitalization: Unknown Has patient had a PCN reaction occurring within the  last 10 years: Unknown If all of the above answers are "NO", then may proceed with Cephalosporin use.   Clindamycin/Lincomycin Swelling and Rash    Have these reactions in feet, ankles and legs.   Penicillins Hives and Rash    REVIEW OF SYSTEMS:   ROS As per history of present illness. All pertinent systems were reviewed above. Constitutional, HEENT, cardiovascular, respiratory, GI, GU, musculoskeletal, neuro, psychiatric, endocrine, integumentary and hematologic systems were reviewed and are otherwise negative/unremarkable except for positive findings mentioned above in the HPI.   MEDICATIONS AT HOME:   Prior to Admission medications   Medication Sig Start Date End Date Taking? Authorizing Provider  albuterol (PROVENTIL HFA;VENTOLIN HFA) 108 (90 Base) MCG/ACT inhaler Inhale 2 puffs into the lungs 5 (five) times daily as needed for wheezing or shortness of breath.    Yes [provider]  albuterol (PROVENTIL) (2.5 MG/3ML) 0.083% nebulizer solution Take 3 mLs (2.5 mg total) by nebulization every 6 (six) hours as needed for wheezing or shortness of breath. 01/07/22  Yes Dartha Lodge, PA-C  benzonatate (TESSALON) 200 MG capsule Take 1 capsule (200 mg total) by mouth 3 (three) times daily as needed for cough. 05/27/22  Yes Sheikh, Omair Latif, DO  fluticasone-salmeterol (WIXELA INHUB) 500-50 MCG/ACT AEPB Inhale 1 puff into the lungs in the morning and at bedtime.   Yes [provider]  hydrOXYzine (ATARAX) 25 MG tablet Take 25 mg by mouth in the morning, at noon, in the evening, and at bedtime.   Yes [provider]  lactose free nutrition (BOOST PLUS) LIQD Take 237 mLs by mouth 3 (three) times daily with meals. 05/27/22  Yes Sheikh, Omair Latif, DO  magnesium oxide (MAG-OX) 400 (240 Mg) MG tablet Take 400 mg by mouth 2 (two) times daily.   Yes [provider]  mirtazapine (REMERON) 15 MG tablet Take 15 mg by mouth in the morning and at bedtime. 04/01/23  Yes  [provider]  oxyCODONE (OXY IR/ROXICODONE) 5 MG immediate release tablet Take 5 mg by mouth 3 (three) times daily as needed for severe pain.   Yes [provider]  Tiotropium Bromide Monohydrate (SPIRIVA RESPIMAT) 1.25 MCG/ACT AERS Inhale 2 each into the lungs daily.   Yes [provider]  ipratropium-albuterol (DUONEB) 0.5-2.5 (3) MG/3ML SOLN Take 3 mLs by nebulization every 4 (four) hours as needed. Patient not taking: Reported on 06/13/2023 07/16/22   Martina Sinner, MD  Multiple Vitamin (MULTIVITAMIN WITH MINERALS) TABS tablet Take 1 tablet by mouth daily. Patient not taking: Reported on 06/13/2023 05/28/22   Marguerita Merles Latif, DO      VITAL SIGNS:  Blood pressure 119/79, pulse (!) 106, temperature 98.7 F (  37.1 C), resp. rate 20, height 5\' 2"  (1.575 m), weight 43.1 kg, SpO2 100 %.  PHYSICAL EXAMINATION:  Physical Exam  GENERAL:  57 y.o.-year-old African-American female patient lying in the bed with mild respiratory distress with conversational dyspnea. EYES: Pupils equal, round, reactive to light and accommodation. No scleral icterus. Extraocular muscles intact.  HEENT: Head atraumatic, normocephalic. Oropharynx and nasopharynx clear.  NECK:  Supple, no jugular venous distention. No thyroid enlargement, no tenderness.  LUNGS: Diffuse expiratory wheezes with tight expiratory airflow and harsh vesicular breathing.  No use of accessory muscles of respiration.  CARDIOVASCULAR: Regular rate and rhythm, S1, S2 normal. No murmurs, rubs, or gallops.  ABDOMEN: Soft, nondistended, nontender. Bowel sounds present. No organomegaly or mass.  EXTREMITIES: No pedal edema, cyanosis, or clubbing.  NEUROLOGIC: Cranial nerves II through XII are intact. Muscle strength 5/5 in all extremities. Sensation intact. Gait not checked.  PSYCHIATRIC: The patient is alert and oriented x 3.  Normal affect and good eye contact. SKIN: No obvious rash, lesion, or ulcer.   LABORATORY  PANEL:   CBC Recent Labs  Lab 06/12/23 2355  WBC 6.6  HGB 13.4  HCT 44.0  PLT 115*   ------------------------------------------------------------------------------------------------------------------  Chemistries  Recent Labs  Lab 06/13/23 0056  NA 137  K 4.0  CL 101  CO2 30  GLUCOSE 88  BUN 12  CREATININE 0.92  CALCIUM 8.6*  AST 18  ALT 14  ALKPHOS 52  BILITOT 0.6   ------------------------------------------------------------------------------------------------------------------  Cardiac Enzymes No results for input(s): "TROPONINI" in the last 168 hours. ------------------------------------------------------------------------------------------------------------------  RADIOLOGY:  CT Angio Chest PE W and/or Wo Contrast  Result Date: 06/13/2023 CLINICAL DATA:  Hemoptysis and chest pain, initial encounter EXAM: CT ANGIOGRAPHY CHEST WITH CONTRAST TECHNIQUE: Multidetector CT imaging of the chest was performed using the standard protocol during bolus administration of intravenous contrast. Multiplanar CT image reconstructions and MIPs were obtained to evaluate the vascular anatomy. RADIATION DOSE REDUCTION: This exam was performed according to the departmental dose-optimization program which includes automated exposure control, adjustment of the mA and/or kV according to patient size and/or use of iterative reconstruction technique. CONTRAST:  75mL OMNIPAQUE IOHEXOL 350 MG/ML SOLN COMPARISON:  05/24/2022 FINDINGS: Cardiovascular: Atherosclerotic calcifications of the thoracic aorta are noted. Heart is enlarged in size. Pulmonary artery shows a normal branching pattern bilaterally. No intraluminal filling defect to suggest pulmonary embolism is noted. Mediastinum/Nodes: Thoracic inlet is within normal limits. No hilar or mediastinal adenopathy is noted. The esophagus as visualized is within normal limits. Lungs/Pleura: Lungs are well aerated bilaterally. Emphysematous changes are  seen. No focal infiltrate or effusion is noted. Upper Abdomen: Visualized upper abdomen is within normal limits. Musculoskeletal: No chest wall abnormality. No acute or significant osseous findings. Review of the MIP images confirms the above findings. IMPRESSION: No evidence of pulmonary emboli. No acute abnormality noted. Aortic Atherosclerosis (ICD10-I70.0) and Emphysema (ICD10-J43.9). Electronically Signed   By: Alcide Clever M.D.   On: 06/13/2023 03:12   DG Chest 2 View  Result Date: 06/12/2023 CLINICAL DATA:  Shortness of breath EXAM: CHEST - 2 VIEW COMPARISON:  10/21/2022 FINDINGS: Lungs are clear.  No pleural effusion or pneumothorax. The heart is normal in size. Mild right perihilar fullness, similar to the prior, likely vascular. Visualized osseous structures are within normal limits. IMPRESSION: Normal chest radiographs. Electronically Signed   By: Charline Bills M.D.   On: 06/12/2023 23:50      IMPRESSION AND PLAN:  Assessment and Plan: * COPD exacerbation (HCC) - The  patient will be admitted to a medically monitored bed. - We will place the patient IV steroid therapy with IV Solu-Medrol as well as nebulized bronchodilator therapy with duonebs q.i.d. and q.4 hours p.r.n.Marland Kitchen - Mucolytic therapy will be provided with Mucinex and antibiotic therapy with IV Rocephin and Zithromax. - O2 protocol will be followed. - We will hold off long-acting beta agonist. - We will continue Spiriva.   Acute respiratory failure with hypoxia (HCC) - This is clearly secondary to #1. - O2 protocol will be followed. - Management otherwise as above  Hemoptysis - Management as above. - She will benefit from pulmonology consult that can be called in AM.  Nicotine abuse I counseled her for smoking cessation and she will receive further counseling here.  Chronic back pain - She also has fibromyalgia. - Pain management will be continued.       DVT prophylaxis: Lovenox.  Advanced Care Planning:   Code Status: full code.  Family Communication:  The plan of care was discussed in details with the patient (and family). I answered all questions. The patient agreed to proceed with the above mentioned plan. Further management will depend upon hospital course. Disposition Plan: Back to previous home environment Consults called: Pulmonology consult can be called in a.m. All the records are reviewed and case discussed with ED provider.  Status is: Inpatient   At the time of the admission, it appears that the appropriate admission status for this patient is inpatient.  This is judged to be reasonable and necessary in order to provide the required intensity of service to ensure the patient's safety given the presenting symptoms, physical exam findings and initial radiographic and laboratory data in the context of comorbid conditions.  The patient requires inpatient status due to high intensity of service, high risk of further deterioration and high frequency of surveillance required.  I certify that at the time of admission, it is my clinical judgment that the patient will require inpatient hospital care extending more than 2 midnights.                            Dispo: The patient is from: Home              Anticipated d/c is to: Home              Patient currently is not medically stable to d/c.              Difficult to place patient: No  Hannah Beat M.D on 06/13/2023 at 5:32 AM  [facility]   From 7 PM-7 AM, contact night-coverage [email or web address]  CC: Primary care physician; Clare Gandy, MD

## 2023-06-13 NOTE — Progress Notes (Signed)
Subjective: Patient admitted this morning, see detailed H&P byDr Mansy 57 year old African-American female with past medical history of COPD, ongoing tobacco abuse, grade 1 diastolic dysfunction, PTSD, chronic back pain, anxiety, fibromyalgia came to ED with acute onset of worsening dyspnea with associated cough and wheezing and incidental hemoptysis while coughing.  In the ED chest x-ray was unremarkable, chest CTA showed no PE.  Patient was given IV Solu-Medrol  Vitals:   06/13/23 0621 06/13/23 0738  BP: 131/65   Pulse: (!) 108   Resp: 18   Temp: 98.4 F (36.9 C)   SpO2: 92% 93%      A/P  COPD exacerbation -Continue Solu-Medrol 40 mg IV every 12 hours -Continue Mucinex, Rocephin and Zithromax -Oxygen per protocol  Hemoptysis -CT chest was unremarkable -Will monitor  Nicotine abuse -Smoking cessation counseling provided   Meredeth Ide [facility]

## 2023-06-13 NOTE — Assessment & Plan Note (Signed)
-   She also has fibromyalgia. - Pain management will be continued.

## 2023-06-13 NOTE — Assessment & Plan Note (Signed)
-   This is clearly secondary to #1. - O2 protocol will be followed. - Management otherwise as above. 

## 2023-06-14 DIAGNOSIS — J9601 Acute respiratory failure with hypoxia: Secondary | ICD-10-CM

## 2023-06-14 DIAGNOSIS — R042 Hemoptysis: Secondary | ICD-10-CM | POA: Diagnosis not present

## 2023-06-14 DIAGNOSIS — J441 Chronic obstructive pulmonary disease with (acute) exacerbation: Secondary | ICD-10-CM | POA: Diagnosis not present

## 2023-06-14 DIAGNOSIS — R1013 Epigastric pain: Secondary | ICD-10-CM | POA: Diagnosis not present

## 2023-06-14 MED ORDER — OXYCODONE HCL 5 MG PO TABS
5.0000 mg | ORAL_TABLET | ORAL | Status: DC | PRN
  Administered 2023-06-14 – 2023-06-15 (×6): 5 mg via ORAL
  Filled 2023-06-14 (×6): qty 1

## 2023-06-14 MED ORDER — IPRATROPIUM-ALBUTEROL 0.5-2.5 (3) MG/3ML IN SOLN
3.0000 mL | Freq: Four times a day (QID) | RESPIRATORY_TRACT | Status: DC
  Administered 2023-06-14 – 2023-06-15 (×3): 3 mL via RESPIRATORY_TRACT
  Filled 2023-06-14 (×4): qty 3

## 2023-06-14 MED ORDER — METHYLPREDNISOLONE SODIUM SUCC 40 MG IJ SOLR
40.0000 mg | Freq: Two times a day (BID) | INTRAMUSCULAR | Status: DC
  Administered 2023-06-14 – 2023-06-15 (×2): 40 mg via INTRAVENOUS
  Filled 2023-06-14 (×2): qty 1

## 2023-06-14 MED ORDER — DICYCLOMINE HCL 10 MG PO CAPS
10.0000 mg | ORAL_CAPSULE | Freq: Three times a day (TID) | ORAL | Status: DC
  Administered 2023-06-14 – 2023-06-15 (×4): 10 mg via ORAL
  Filled 2023-06-14 (×4): qty 1

## 2023-06-14 MED ORDER — BUDESONIDE 0.25 MG/2ML IN SUSP
0.2500 mg | Freq: Two times a day (BID) | RESPIRATORY_TRACT | Status: DC
  Administered 2023-06-14 – 2023-06-15 (×2): 0.25 mg via RESPIRATORY_TRACT
  Filled 2023-06-14 (×2): qty 2

## 2023-06-14 MED ORDER — FAMOTIDINE IN NACL 20-0.9 MG/50ML-% IV SOLN
20.0000 mg | Freq: Once | INTRAVENOUS | Status: AC
  Administered 2023-06-14: 20 mg via INTRAVENOUS
  Filled 2023-06-14: qty 50

## 2023-06-14 MED ORDER — DOXYCYCLINE HYCLATE 100 MG PO TABS
100.0000 mg | ORAL_TABLET | Freq: Two times a day (BID) | ORAL | Status: DC
  Administered 2023-06-14 – 2023-06-15 (×3): 100 mg via ORAL
  Filled 2023-06-14 (×3): qty 1

## 2023-06-14 NOTE — Progress Notes (Signed)
Patient ambulated in [place] via Nursing Tech; Patient desat to 84% on RA; placed in chair and escorted back to room. Placed on 2L nasal cannula. Patient is a/o x 4; no acute distress; MD advised.   Oxygen Therapy   Treatment Indications: Hypoxia and SOB/Increased WOB    Treatment Goals: Goal is to treat hypoxia/hypoxemia    Plan of Care: Nasal cannula (liters/minute);  2 LPM

## 2023-06-14 NOTE — Progress Notes (Signed)
Mobility Specialist - Progress Note   06/14/23 1409  Oxygen Therapy  SpO2 95 %  O2 Device Nasal Cannula  O2 Flow Rate (L/min) 2 L/min  Mobility  Activity Ambulated with assistance in room  Level of Assistance Contact guard assist, steadying assist  Assistive Device Wheelchair  Distance Ambulated (ft) 8 ft  Range of Motion/Exercises Active  Activity Response Tolerated well  Mobility Referral Yes  $Mobility charge 1 Mobility  Mobility Specialist Start Time (ACUTE ONLY) 1400  Mobility Specialist Stop Time (ACUTE ONLY) 1410  Mobility Specialist Time Calculation (min) (ACUTE ONLY) 10 min   Pt received in hallway with NT, took over and had no issues throughout session.   When received, pt was on Room Air at 85% SpO2.  Nurse requested Mobility Specialist to perform oxygen saturation test with pt which includes removing pt from oxygen both at rest and while ambulating.  Below are the results from that testing.     Patient Saturations on 2 Liters of oxygen while Ambulating = sp02 95%  At end of testing pt left in room on 3 Liters of oxygen.  Reported results to nurse.    Pt returned to bed with all needs met.  Marilynne Halsted Mobility Specialist

## 2023-06-14 NOTE — Plan of Care (Signed)

## 2023-06-14 NOTE — Progress Notes (Signed)
[facility]  PROGRESS NOTE  Michele Lucas ZOX:[id number] DOB: August 19, 1966 DOA: 06/12/2023 PCP: Clare Gandy, MD   Brief HPI:   57 year old African-American female with past medical history of COPD, ongoing tobacco abuse, grade 1 diastolic dysfunction, PTSD, chronic back pain, anxiety, fibromyalgia came to ED with acute onset of worsening dyspnea with associated cough and wheezing and incidental hemoptysis while coughing.  In the ED chest x-ray was unremarkable, chest CTA showed no PE.  Patient was given IV Solu-Medrol     Assessment/Plan:   COPD exacerbation -Continue Solu-Medrol 40 mg IV every 12 hours -Chest x-ray was normal, will switch IV antibiotics to doxycycline -Continue DuoNeb nebulizers every 6 hours -Continue oxygen; wean off as tolerated  Abdominal pain -?  GERD/gastritis -Start Bentyl 10 mg 3 times daily -Pepcid 20 mg IV x 1 -Continue oxycodone as needed  Hemoptysis -Resolved -CT chest was unremarkable  Nicotine abuse -Smoking cessation counseling provided    Medications     dicyclomine  10 mg Oral TID AC   doxycycline  100 mg Oral Q12H   hydrOXYzine  25 mg Oral TID   ipratropium-albuterol  3 mL Nebulization BID   lactose free nutrition  237 mL Oral TID WC   magnesium oxide  400 mg Oral BID   methylPREDNISolone (SOLU-MEDROL) injection  40 mg Intravenous Q12H   umeclidinium bromide  1 puff Inhalation Daily     Data Reviewed:   CBG:  No results for input(s): "GLUCAP" in the last 168 hours.  SpO2: 95 % O2 Flow Rate (L/min): 2 L/min    Vitals:   06/13/23 1735 06/13/23 1939 06/14/23 0439 06/14/23 1409  BP: 124/73 114/73 138/78   Pulse: 84 84 68   Resp: 18 18 18    Temp: 97.7 F (36.5 C) (!) 97.5 F (36.4 C) 97.7 F (36.5 C)   TempSrc: Oral Oral Oral   SpO2: 96% 99% 100% 95%  Weight:      Height:          Data Reviewed:  Basic Metabolic Panel: Recent Labs  Lab 06/13/23 0056 06/13/23 0625  NA 137 138  K 4.0 4.4  CL 101 100   CO2 30 28  GLUCOSE 88 137*  BUN 12 12  CREATININE 0.92 0.94  CALCIUM 8.6* 9.3    CBC: Recent Labs  Lab 06/12/23 2355  WBC 6.6  NEUTROABS 3.5  HGB 13.4  HCT 44.0  MCV 102.3*  PLT 115*    LFT Recent Labs  Lab 06/13/23 0056  AST 18  ALT 14  ALKPHOS 52  BILITOT 0.6  PROT 6.6  ALBUMIN 3.7     Antibiotics: Anti-infectives (From admission, onward)    Start     Dose/Rate Route Frequency Ordered Stop   06/14/23 1000  doxycycline (VIBRA-TABS) tablet 100 mg        100 mg Oral Every 12 hours 06/14/23 0851     06/13/23 0600  cefTRIAXone (ROCEPHIN) 1 g in sodium chloride 0.9 % 100 mL IVPB  Status:  Discontinued        1 g 200 mL/hr over 30 Minutes Intravenous Every 24 hours 06/13/23 0500 06/14/23 0851   06/13/23 0600  azithromycin (ZITHROMAX) 500 mg in sodium chloride 0.9 % 250 mL IVPB  Status:  Discontinued        500 mg 250 mL/hr over 60 Minutes Intravenous Every 24 hours 06/13/23 0528 06/14/23 0851        DVT prophylaxis: SCDs  Code Status: Full code  Family Communication:  No family at bedside   CONSULTS    Subjective   Complains of abdominal pain today   Objective    Physical Examination:   General-appears in no acute distress Heart-S1-S2, regular, no murmur auscultated Lungs-bilateral wheezing auscultated Abdomen-soft, mild tenderness in epigastric region Extremities-no edema in the lower extremities Neuro-alert, oriented x3, no focal deficit noted  Status is: Inpatient:             Meredeth Ide   [facility] If 7PM-7AM, please contact night-coverage at [email or web address], Office  [phone]   06/14/2023, 2:17 PM  LOS: 1 day

## 2023-06-15 DIAGNOSIS — M549 Dorsalgia, unspecified: Secondary | ICD-10-CM

## 2023-06-15 DIAGNOSIS — J9601 Acute respiratory failure with hypoxia: Secondary | ICD-10-CM | POA: Diagnosis not present

## 2023-06-15 DIAGNOSIS — Z720 Tobacco use: Secondary | ICD-10-CM

## 2023-06-15 DIAGNOSIS — J441 Chronic obstructive pulmonary disease with (acute) exacerbation: Secondary | ICD-10-CM | POA: Diagnosis not present

## 2023-06-15 DIAGNOSIS — R042 Hemoptysis: Secondary | ICD-10-CM | POA: Diagnosis not present

## 2023-06-15 DIAGNOSIS — G8929 Other chronic pain: Secondary | ICD-10-CM

## 2023-06-15 MED ORDER — OXYCODONE HCL 5 MG PO TABS: 5.0000 mg | ORAL_TABLET | Freq: Three times a day (TID) | ORAL | 0 refills | Status: AC | PRN

## 2023-06-15 MED ORDER — DOXYCYCLINE HYCLATE 100 MG PO TABS: 100.0000 mg | ORAL_TABLET | Freq: Two times a day (BID) | ORAL | 0 refills | Status: AC

## 2023-06-15 MED ORDER — PREDNISONE 10 MG PO TABS: ORAL_TABLET | ORAL | 0 refills | Status: DC

## 2023-06-15 MED ORDER — PANTOPRAZOLE SODIUM 40 MG PO TBEC: 40.0000 mg | DELAYED_RELEASE_TABLET | Freq: Every day | ORAL | 1 refills | Status: DC

## 2023-06-15 NOTE — Progress Notes (Signed)
Tele monitor #[id number], dc'd from patient, cleaned and returned to designated spot at front desk.

## 2023-06-15 NOTE — Progress Notes (Signed)
Pt d/c'd prior to completion of TOC assessment.

## 2023-06-15 NOTE — Discharge Summary (Addendum)
Physician Discharge Summary   Patient: Michele Lucas MRN: [id number] DOB: 19-Oct-1966  Admit date:     06/12/2023  Discharge date: 06/15/23  Discharge Physician: Meredeth Ide   PCP: Clare Gandy, MD   Recommendations at discharge:   Follow-up PCP in 1 week  Discharge Diagnoses: Principal Problem:   COPD exacerbation (HCC) Active Problems:   Acute respiratory failure with hypoxia (HCC)   Hemoptysis   Chronic back pain   Nicotine abuse  Resolved Problems:   * No resolved hospital problems. *  Hospital Course: 57 year old African-American female with past medical history of COPD, ongoing tobacco abuse, grade 1 diastolic dysfunction, PTSD, chronic back pain, anxiety, fibromyalgia came to ED with acute onset of worsening dyspnea with associated cough and wheezing and incidental hemoptysis while coughing.  In the ED chest x-ray was unremarkable, chest CTA showed no PE.  Patient was given IV Solu-Medrol      Assessment and Plan:   COPD exacerbation -Improved after starting Solu-Medrol 40 mg IV every 2 hours -Chest x-ray was normal, IV antibiotics were switched to doxycycline -Also received  DuoNeb nebulizers every 6 hours -Oxygen weaned off, patient O2 sats 91% on room air -Will discharge home on doxycycline, prednisone taper, albuterol nebulizer as needed   Abdominal pain -?  GERD/gastritis -Start Bentyl 10 mg 3 times daily -Pepcid 20 mg IV x 1 -Also given oxycodone as needed in the hospital; patient takes oxycodone at home for back pain -Will discharge on Protonix 40 mg p.o. daily   Hemoptysis -Resolved -CT chest was unremarkable -Likely due to above   Nicotine abuse -Smoking cessation counseling provided           Consultants:  Procedures performed:  Disposition: Home Diet recommendation:  Discharge Diet Orders (From admission, onward)     Start     Ordered   06/15/23 0000  Diet - low sodium heart healthy        06/15/23 1214           Regular  diet DISCHARGE MEDICATION: Allergies as of 06/15/2023       Reactions   Guaifenesin & Derivatives Shortness Of Breath   "Mucinex brand"   Cymbalta [duloxetine Hcl] Other (See Comments)   Unk reaction   Ibuprofen    Upset stomach   Neurontin [gabapentin] Other (See Comments)   Reported by [facility] - slowed heart rate per pt   Pork-derived Products    Patient prefers to not eat any pork/swine   Prozac [fluoxetine Hcl] Other (See Comments)   Loss of appetite, sedation   Tylenol [acetaminophen]    Upset stomach   Ampicillin Hives, Rash   Has patient had a PCN reaction causing immediate rash, facial/tongue/throat swelling, SOB or lightheadedness with hypotension: Yes Has patient had a PCN reaction causing severe rash involving mucus membranes or skin necrosis: No Has patient had a PCN reaction that required hospitalization: Unknown Has patient had a PCN reaction occurring within the last 10 years: Unknown If all of the above answers are "NO", then may proceed with Cephalosporin use.   Clindamycin/lincomycin Swelling, Rash   Have these reactions in feet, ankles and legs.   Penicillins Hives, Rash        Medication List     STOP taking these medications    ipratropium-albuterol 0.5-2.5 (3) MG/3ML Soln Commonly known as: DUONEB       TAKE these medications    albuterol 108 (90 Base) MCG/ACT inhaler Commonly known as: VENTOLIN HFA  Inhale 2 puffs into the lungs 5 (five) times daily as needed for wheezing or shortness of breath.   albuterol (2.5 MG/3ML) 0.083% nebulizer solution Commonly known as: PROVENTIL Take 3 mLs (2.5 mg total) by nebulization every 6 (six) hours as needed for wheezing or shortness of breath.   benzonatate 200 MG capsule Commonly known as: TESSALON Take 1 capsule (200 mg total) by mouth 3 (three) times daily as needed for cough.   doxycycline 100 MG tablet Commonly known as: VIBRA-TABS Take 1 tablet (100 mg total) by mouth every 12  (twelve) hours for 4 days.   hydrOXYzine 25 MG tablet Commonly known as: ATARAX Take 25 mg by mouth in the morning, at noon, in the evening, and at bedtime.   lactose free nutrition Liqd Take 237 mLs by mouth 3 (three) times daily with meals.   magnesium oxide 400 (240 Mg) MG tablet Commonly known as: MAG-OX Take 400 mg by mouth 2 (two) times daily.   mirtazapine 15 MG tablet Commonly known as: REMERON Take 15 mg by mouth in the morning and at bedtime.   multivitamin with minerals Tabs tablet Take 1 tablet by mouth daily.   oxyCODONE 5 MG immediate release tablet Commonly known as: Oxy IR/ROXICODONE Take 1 tablet (5 mg total) by mouth 3 (three) times daily as needed for severe pain.   pantoprazole 40 MG tablet Commonly known as: Protonix Take 1 tablet (40 mg total) by mouth daily.   predniSONE 10 MG tablet Commonly known as: DELTASONE Prednisone 40 mg po daily x 1 day then Prednisone 30 mg po daily x 1 day then Prednisone 20 mg po daily x 1 day then Prednisone 10 mg daily x 1 day then stop...   Spiriva Respimat 1.25 MCG/ACT Aers Generic drug: Tiotropium Bromide Monohydrate Inhale 2 each into the lungs daily.   Wixela Inhub 500-50 MCG/ACT Aepb Generic drug: fluticasone-salmeterol Inhale 1 puff into the lungs in the morning and at bedtime.        Discharge Exam: Filed Weights   06/12/23 2309 06/13/23 0621  Weight: 43.1 kg 44.1 kg   General-appears in no acute distress Heart-S1-S2, regular, no murmur auscultated Lungs-clear to auscultation bilaterally, no wheezing or crackles auscultated Abdomen-soft, nontender, no organomegaly Extremities-no edema in the lower extremities Neuro-alert, oriented x3, no focal deficit noted  Condition at discharge: good  The results of significant diagnostics from this hospitalization (including imaging, microbiology, ancillary and laboratory) are listed below for reference.   Imaging Studies: CT Angio Chest PE W and/or Wo  Contrast  Result Date: 06/13/2023 CLINICAL DATA:  Hemoptysis and chest pain, initial encounter EXAM: CT ANGIOGRAPHY CHEST WITH CONTRAST TECHNIQUE: Multidetector CT imaging of the chest was performed using the standard protocol during bolus administration of intravenous contrast. Multiplanar CT image reconstructions and MIPs were obtained to evaluate the vascular anatomy. RADIATION DOSE REDUCTION: This exam was performed according to the departmental dose-optimization program which includes automated exposure control, adjustment of the mA and/or kV according to patient size and/or use of iterative reconstruction technique. CONTRAST:  75mL OMNIPAQUE IOHEXOL 350 MG/ML SOLN COMPARISON:  05/24/2022 FINDINGS: Cardiovascular: Atherosclerotic calcifications of the thoracic aorta are noted. Heart is enlarged in size. Pulmonary artery shows a normal branching pattern bilaterally. No intraluminal filling defect to suggest pulmonary embolism is noted. Mediastinum/Nodes: Thoracic inlet is within normal limits. No hilar or mediastinal adenopathy is noted. The esophagus as visualized is within normal limits. Lungs/Pleura: Lungs are well aerated bilaterally. Emphysematous changes are seen. No focal infiltrate  or effusion is noted. Upper Abdomen: Visualized upper abdomen is within normal limits. Musculoskeletal: No chest wall abnormality. No acute or significant osseous findings. Review of the MIP images confirms the above findings. IMPRESSION: No evidence of pulmonary emboli. No acute abnormality noted. Aortic Atherosclerosis (ICD10-I70.0) and Emphysema (ICD10-J43.9). Electronically Signed   By: Alcide Clever M.D.   On: 06/13/2023 03:12   DG Chest 2 View  Result Date: 06/12/2023 CLINICAL DATA:  Shortness of breath EXAM: CHEST - 2 VIEW COMPARISON:  10/21/2022 FINDINGS: Lungs are clear.  No pleural effusion or pneumothorax. The heart is normal in size. Mild right perihilar fullness, similar to the prior, likely vascular.  Visualized osseous structures are within normal limits. IMPRESSION: Normal chest radiographs. Electronically Signed   By: Charline Bills M.D.   On: 06/12/2023 23:50    Microbiology: Results for orders placed or performed during the hospital encounter of 06/12/23  Resp panel by RT-PCR (RSV, Flu A&B, Covid) Anterior Nasal Swab     Status: None   Collection Time: 06/12/23 12:02 AM   Specimen: Anterior Nasal Swab  Result Value Ref Range Status   SARS Coronavirus 2 by RT PCR NEGATIVE NEGATIVE Final    Comment: (NOTE) SARS-CoV-2 target nucleic acids are NOT DETECTED.  The SARS-CoV-2 RNA is generally detectable in upper respiratory specimens during the acute phase of infection. The lowest concentration of SARS-CoV-2 viral copies this assay can detect is 138 copies/mL. A negative result does not preclude SARS-Cov-2 infection and should not be used as the sole basis for treatment or other patient management decisions. A negative result may occur with  improper specimen collection/handling, submission of specimen other than nasopharyngeal swab, presence of viral mutation(s) within the areas targeted by this assay, and inadequate number of viral copies(<138 copies/mL). A negative result must be combined with clinical observations, patient history, and epidemiological information. The expected result is Negative.  Fact Sheet for Patients:  BloggerCourse.com  Fact Sheet for Healthcare Providers:  SeriousBroker.it  This test is no t yet approved or cleared by the [place] FDA and  has been authorized for detection and/or diagnosis of SARS-CoV-2 by FDA under an Emergency Use Authorization (EUA). This EUA will remain  in effect (meaning this test can be used) for the duration of the COVID-19 declaration under Section 564(b)(1) of the Act, 21 U.S.C.section 360bbb-3(b)(1), unless the authorization is terminated  or revoked sooner.        Influenza A by PCR NEGATIVE NEGATIVE Final   Influenza B by PCR NEGATIVE NEGATIVE Final    Comment: (NOTE) The Xpert Xpress SARS-CoV-2/FLU/RSV plus assay is intended as an aid in the diagnosis of influenza from Nasopharyngeal swab specimens and should not be used as a sole basis for treatment. Nasal washings and aspirates are unacceptable for Xpert Xpress SARS-CoV-2/FLU/RSV testing.  Fact Sheet for Patients: BloggerCourse.com  Fact Sheet for Healthcare Providers: SeriousBroker.it  This test is not yet approved or cleared by the [place] FDA and has been authorized for detection and/or diagnosis of SARS-CoV-2 by FDA under an Emergency Use Authorization (EUA). This EUA will remain in effect (meaning this test can be used) for the duration of the COVID-19 declaration under Section 564(b)(1) of the Act, 21 U.S.C. section 360bbb-3(b)(1), unless the authorization is terminated or revoked.     Resp Syncytial Virus by PCR NEGATIVE NEGATIVE Final    Comment: (NOTE) Fact Sheet for Patients: BloggerCourse.com  Fact Sheet for Healthcare Providers: SeriousBroker.it  This test is not yet approved or cleared  by the [place] and has been authorized for detection and/or diagnosis of SARS-CoV-2 by FDA under an Emergency Use Authorization (EUA). This EUA will remain in effect (meaning this test can be used) for the duration of the COVID-19 declaration under Section 564(b)(1) of the Act, 21 U.S.C. section 360bbb-3(b)(1), unless the authorization is terminated or revoked.  Performed at [facility], [address] [place], [place] [address]     Labs: CBC: Recent Labs  Lab 06/12/23 2355  WBC 6.6  NEUTROABS 3.5  HGB 13.4  HCT 44.0  MCV 102.3*  PLT 115*   Basic Metabolic Panel: Recent Labs  Lab 06/13/23 0056 06/13/23 0625  NA 137 138  K 4.0 4.4   CL 101 100  CO2 30 28  GLUCOSE 88 137*  BUN 12 12  CREATININE 0.92 0.94  CALCIUM 8.6* 9.3   Liver Function Tests: Recent Labs  Lab 06/13/23 0056  AST 18  ALT 14  ALKPHOS 52  BILITOT 0.6  PROT 6.6  ALBUMIN 3.7   CBG: No results for input(s): "GLUCAP" in the last 168 hours.  Discharge time spent: greater than 30 minutes.  Signed: Meredeth Ide, MD [facility] 06/15/2023

## 2023-06-15 NOTE — Plan of Care (Signed)
°  Problem: Education: °Goal: Knowledge of disease or condition will improve °Outcome: Progressing °  °Problem: Respiratory: °Goal: Ability to maintain a clear airway will improve °Outcome: Progressing °  °Problem: Respiratory: °Goal: Levels of oxygenation will improve °Outcome: Progressing °  °

## 2023-07-31 ENCOUNTER — Encounter (HOSPITAL_COMMUNITY): Payer: Self-pay

## 2023-08-18 ENCOUNTER — Telehealth (HOSPITAL_COMMUNITY): Payer: Self-pay

## 2023-08-18 NOTE — Telephone Encounter (Signed)
Called and spoke with pt in regards to PR, pt stated she was in a meeting and will contact PR back later today.

## 2023-08-19 ENCOUNTER — Telehealth (HOSPITAL_COMMUNITY): Payer: Self-pay

## 2023-08-19 NOTE — Telephone Encounter (Signed)
Pt returned PR phone call and stated she is interested. Patient will come in for orientation on 08/22/23 @ 10:30AM and will attend the 1:15PM exercise class.   Pensions consultant.

## 2023-08-21 ENCOUNTER — Telehealth (HOSPITAL_COMMUNITY): Payer: Self-pay

## 2023-08-21 NOTE — Telephone Encounter (Signed)
Called to confirm appt. Pt confirmed appt. Instructed pt on proper footwear. Gave directions along with department number.

## 2023-08-22 ENCOUNTER — Telehealth (HOSPITAL_COMMUNITY): Payer: Self-pay

## 2023-08-22 ENCOUNTER — Ambulatory Visit (HOSPITAL_COMMUNITY)

## 2023-08-22 NOTE — Telephone Encounter (Signed)
Pt would like to reschedule her pulmonary rehab do to not feeling well.

## 2023-08-26 ENCOUNTER — Ambulatory Visit (HOSPITAL_COMMUNITY)

## 2023-08-28 ENCOUNTER — Ambulatory Visit (HOSPITAL_COMMUNITY)

## 2023-09-02 ENCOUNTER — Ambulatory Visit (HOSPITAL_COMMUNITY)

## 2023-09-04 ENCOUNTER — Ambulatory Visit (HOSPITAL_COMMUNITY)

## 2023-09-09 ENCOUNTER — Ambulatory Visit (HOSPITAL_COMMUNITY)

## 2023-09-10 ENCOUNTER — Telehealth (HOSPITAL_COMMUNITY): Payer: Self-pay

## 2023-09-10 ENCOUNTER — Encounter (HOSPITAL_COMMUNITY): Payer: Self-pay

## 2023-09-10 NOTE — Telephone Encounter (Signed)
Attempted to contact pt to reschedule for PR. [facility]   Mailed letter

## 2023-09-11 ENCOUNTER — Ambulatory Visit (HOSPITAL_COMMUNITY)

## 2023-09-16 ENCOUNTER — Ambulatory Visit (HOSPITAL_COMMUNITY)

## 2023-09-18 ENCOUNTER — Ambulatory Visit (HOSPITAL_COMMUNITY)

## 2023-09-23 ENCOUNTER — Ambulatory Visit (HOSPITAL_COMMUNITY)

## 2023-09-25 ENCOUNTER — Ambulatory Visit (HOSPITAL_COMMUNITY)

## 2023-09-26 ENCOUNTER — Telehealth (HOSPITAL_COMMUNITY): Payer: Self-pay

## 2023-09-26 NOTE — Telephone Encounter (Signed)
No response from pt.  Closed referral  

## 2023-09-30 ENCOUNTER — Ambulatory Visit (HOSPITAL_COMMUNITY)

## 2023-10-02 ENCOUNTER — Ambulatory Visit (HOSPITAL_COMMUNITY)

## 2023-10-07 ENCOUNTER — Ambulatory Visit (HOSPITAL_COMMUNITY)

## 2023-10-09 ENCOUNTER — Ambulatory Visit (HOSPITAL_COMMUNITY)

## 2023-10-14 ENCOUNTER — Ambulatory Visit (HOSPITAL_COMMUNITY)

## 2023-10-16 ENCOUNTER — Ambulatory Visit (HOSPITAL_COMMUNITY)

## 2023-10-21 ENCOUNTER — Ambulatory Visit (HOSPITAL_COMMUNITY)

## 2023-10-23 ENCOUNTER — Ambulatory Visit (HOSPITAL_COMMUNITY)

## 2023-10-28 ENCOUNTER — Ambulatory Visit (HOSPITAL_COMMUNITY)

## 2023-10-30 ENCOUNTER — Ambulatory Visit (HOSPITAL_COMMUNITY)

## 2023-11-01 ENCOUNTER — Emergency Department (HOSPITAL_COMMUNITY): Payer: No Typology Code available for payment source

## 2023-11-01 ENCOUNTER — Encounter (HOSPITAL_COMMUNITY): Payer: Self-pay | Admitting: Emergency Medicine

## 2023-11-01 ENCOUNTER — Emergency Department (HOSPITAL_COMMUNITY)
Admission: EM | Admit: 2023-11-01 | Discharge: 2023-11-01 | Disposition: A | Discharge status: Home / Self Care | Payer: No Typology Code available for payment source | Attending: Emergency Medicine | Admitting: Emergency Medicine

## 2023-11-01 DIAGNOSIS — R069 Unspecified abnormalities of breathing: Secondary | ICD-10-CM | POA: Diagnosis not present

## 2023-11-01 DIAGNOSIS — I959 Hypotension, unspecified: Secondary | ICD-10-CM | POA: Diagnosis not present

## 2023-11-01 DIAGNOSIS — Z7951 Long term (current) use of inhaled steroids: Secondary | ICD-10-CM | POA: Insufficient documentation

## 2023-11-01 DIAGNOSIS — F17200 Nicotine dependence, unspecified, uncomplicated: Secondary | ICD-10-CM | POA: Diagnosis not present

## 2023-11-01 DIAGNOSIS — J441 Chronic obstructive pulmonary disease with (acute) exacerbation: Secondary | ICD-10-CM | POA: Diagnosis not present

## 2023-11-01 DIAGNOSIS — R0689 Other abnormalities of breathing: Secondary | ICD-10-CM | POA: Diagnosis not present

## 2023-11-01 DIAGNOSIS — R0602 Shortness of breath: Secondary | ICD-10-CM | POA: Diagnosis present

## 2023-11-01 DIAGNOSIS — R0902 Hypoxemia: Secondary | ICD-10-CM | POA: Diagnosis not present

## 2023-11-01 LAB — CBC WITH DIFFERENTIAL/PLATELET
Abs Immature Granulocytes: 0.01 10*3/uL (ref 0.00–0.07)
Basophils Absolute: 0 10*3/uL (ref 0.0–0.1)
Basophils Relative: 0 %
Eosinophils Absolute: 0 10*3/uL (ref 0.0–0.5)
Eosinophils Relative: 0 %
HCT: 41.1 % (ref 36.0–46.0)
Hemoglobin: 12.8 g/dL (ref 12.0–15.0)
Immature Granulocytes: 0 %
Lymphocytes Relative: 8 %
Lymphs Abs: 0.4 10*3/uL — ABNORMAL LOW (ref 0.7–4.0)
MCH: 31.8 pg (ref 26.0–34.0)
MCHC: 31.1 g/dL (ref 30.0–36.0)
MCV: 102.2 fL — ABNORMAL HIGH (ref 80.0–100.0)
Monocytes Absolute: 0.1 10*3/uL (ref 0.1–1.0)
Monocytes Relative: 1 %
Neutro Abs: 4.5 10*3/uL (ref 1.7–7.7)
Neutrophils Relative %: 91 %
Platelets: 145 10*3/uL — ABNORMAL LOW (ref 150–400)
RBC: 4.02 MIL/uL (ref 3.87–5.11)
RDW: 13.2 % (ref 11.5–15.5)
WBC: 4.9 10*3/uL (ref 4.0–10.5)
nRBC: 0 % (ref 0.0–0.2)

## 2023-11-01 LAB — BASIC METABOLIC PANEL
Anion gap: 9 (ref 5–15)
BUN: 5 mg/dL — ABNORMAL LOW (ref 6–20)
CO2: 30 mmol/L (ref 22–32)
Calcium: 9.6 mg/dL (ref 8.9–10.3)
Chloride: 98 mmol/L (ref 98–111)
Creatinine, Ser: 0.68 mg/dL (ref 0.44–1.00)
GFR, Estimated: >60 mL/min (ref >60)
Glucose, Bld: 89 mg/dL (ref 70–99)
Potassium: 3.7 mmol/L (ref 3.5–5.1)
Sodium: 137 mmol/L (ref 135–145)

## 2023-11-01 LAB — BRAIN NATRIURETIC PEPTIDE: B Natriuretic Peptide: 109.1 pg/mL — ABNORMAL HIGH (ref 0.0–100.0)

## 2023-11-01 MED ORDER — MAGNESIUM SULFATE IN D5W 1-5 GM/100ML-% IV SOLN
1.0000 g | Freq: Once | INTRAVENOUS | Status: AC
  Administered 2023-11-01: 1 g via INTRAVENOUS
  Filled 2023-11-01: qty 100

## 2023-11-01 MED ORDER — PREDNISONE 20 MG PO TABS
ORAL_TABLET | ORAL | 0 refills | Status: DC
Start: 2023-11-01 — End: 2024-06-27

## 2023-11-01 MED ORDER — AZITHROMYCIN 250 MG PO TABS: ORAL_TABLET | ORAL | 0 refills | Status: DC

## 2023-11-01 MED ORDER — IPRATROPIUM-ALBUTEROL 0.5-2.5 (3) MG/3ML IN SOLN
3.0000 mL | Freq: Once | RESPIRATORY_TRACT | Status: AC
  Administered 2023-11-01: 3 mL via RESPIRATORY_TRACT
  Filled 2023-11-01: qty 3

## 2023-11-01 MED ORDER — METHYLPREDNISOLONE SODIUM SUCC 125 MG IJ SOLR
125.0000 mg | Freq: Once | INTRAMUSCULAR | Status: AC
  Administered 2023-11-01: 125 mg via INTRAVENOUS
  Filled 2023-11-01: qty 2

## 2023-11-01 NOTE — ED Provider Notes (Signed)
[place] EMERGENCY DEPARTMENT AT [facility] Provider Note   CSN: [id number] Arrival date & time: 11/01/23  0710     History  No chief complaint on file.   Michele Lucas is a 57 y.o. female.  The history is provided by the patient and medical records. No language interpreter was used.     57 year old female with significant history of COPD, generalized anxiety disorder, fibromyalgia's, tobacco abuse brought here via EMS from home with complaints of shortness of breath.  Patient states for the past week and a half she has had progressive worsening shortness of breath, wheezing, fatigue, productive cough, and overall not feeling well.  She felt that this is similar to her COPD exacerbation that she has been hospitalized previously.  She tries using her home nebulizer and rescue inhaler around-the-clock without adequate relief.  She admits to tobacco use but states she has decreased her consumption significantly now down to 3 cigarettes a day.  She feels she needs supplemental oxygen at home but states the [place] has not approved that yet.  She denies any nausea vomiting diarrhea no fever or chills.  Home Medications Prior to Admission medications   Medication Sig Start Date End Date Taking? Authorizing Provider  albuterol (PROVENTIL HFA;VENTOLIN HFA) 108 (90 Base) MCG/ACT inhaler Inhale 2 puffs into the lungs 5 (five) times daily as needed for wheezing or shortness of breath.     [provider]  albuterol (PROVENTIL) (2.5 MG/3ML) 0.083% nebulizer solution Take 3 mLs (2.5 mg total) by nebulization every 6 (six) hours as needed for wheezing or shortness of breath. 01/07/22   Simeon Craft, PA-C  benzonatate (TESSALON) 200 MG capsule Take 1 capsule (200 mg total) by mouth 3 (three) times daily as needed for cough. 05/27/22   Sheikh, Omair Latif, DO  fluticasone-salmeterol (WIXELA INHUB) 500-50 MCG/ACT AEPB Inhale 1 puff into the lungs in the morning and at bedtime.     [provider]  hydrOXYzine (ATARAX) 25 MG tablet Take 25 mg by mouth in the morning, at noon, in the evening, and at bedtime.    [provider]  lactose free nutrition (BOOST PLUS) LIQD Take 237 mLs by mouth 3 (three) times daily with meals. 05/27/22   Marguerita Merles Latif, DO  magnesium oxide (MAG-OX) 400 (240 Mg) MG tablet Take 400 mg by mouth 2 (two) times daily.    [provider]  mirtazapine (REMERON) 15 MG tablet Take 15 mg by mouth in the morning and at bedtime. 04/01/23   [provider]  Multiple Vitamin (MULTIVITAMIN WITH MINERALS) TABS tablet Take 1 tablet by mouth daily. Patient not taking: Reported on 06/13/2023 05/28/22   Marguerita Merles Latif, DO  oxyCODONE (OXY IR/ROXICODONE) 5 MG immediate release tablet Take 1 tablet (5 mg total) by mouth 3 (three) times daily as needed for severe pain. 06/15/23   Meredeth Ide, MD  pantoprazole (PROTONIX) 40 MG tablet Take 1 tablet (40 mg total) by mouth daily. 06/15/23 08/14/23  Meredeth Ide, MD  predniSONE (DELTASONE) 10 MG tablet Prednisone 40 mg po daily x 1 day then Prednisone 30 mg po daily x 1 day then Prednisone 20 mg po daily x 1 day then Prednisone 10 mg daily x 1 day then stop... 06/15/23   Meredeth Ide, MD  Tiotropium Bromide Monohydrate (SPIRIVA RESPIMAT) 1.25 MCG/ACT AERS Inhale 2 each into the lungs daily.    [provider]      Allergies    Guaifenesin &  derivatives, Cymbalta [duloxetine hcl], Ibuprofen, Neurontin [gabapentin], Pork-derived products, Prozac [fluoxetine hcl], Tylenol [acetaminophen], Ampicillin, Clindamycin/lincomycin, and Penicillins    Review of Systems   Review of Systems  All other systems reviewed and are negative.   Physical Exam Updated Vital Signs BP 120/72 (BP Location: Left Arm)   Pulse 84   Temp 98.6 F (37 C) (Oral)   Resp (!) 22   Ht 5' 2.01" (1.575 m)   Wt 44.1 kg   SpO2 96%   BMI 17.78 kg/m  Physical Exam Vitals and nursing note reviewed.   Constitutional:      General: She is not in acute distress.    Appearance: She is well-developed.     Comments: Frail-appearing female sitting upright appears uncomfortable but nontoxic.  She is able to speak in complete sentences.  HENT:     Head: Atraumatic.  Eyes:     Conjunctiva/sclera: Conjunctivae normal.  Cardiovascular:     Rate and Rhythm: Normal rate and regular rhythm.  Pulmonary:     Comments: Decreased breath sounds with mild tachypnea no overt wheezes rales or rhonchi heard. Abdominal:     Palpations: Abdomen is soft.     Tenderness: There is no abdominal tenderness.  Musculoskeletal:     Cervical back: Neck supple.     Right lower leg: No edema.     Left lower leg: No edema.  Skin:    Findings: No rash.  Neurological:     Mental Status: She is alert.  Psychiatric:        Mood and Affect: Mood normal.     ED Results / Procedures / Treatments   Labs (all labs ordered are listed, but only abnormal results are displayed) Labs Reviewed  BASIC METABOLIC PANEL - Abnormal; Notable for the following components:      Result Value   BUN 5 (*)    All other components within normal limits  BRAIN NATRIURETIC PEPTIDE - Abnormal; Notable for the following components:   B Natriuretic Peptide 109.1 (*)    All other components within normal limits  CBC WITH DIFFERENTIAL/PLATELET - Abnormal; Notable for the following components:   MCV 102.2 (*)    Platelets 145 (*)    Lymphs Abs 0.4 (*)    All other components within normal limits  I-STAT VENOUS BLOOD GAS, ED    EKG EKG Interpretation Date/Time:  Saturday November 01 2023 08:49:19 EST Ventricular Rate:  101 PR Interval:  122 QRS Duration:  72 QT Interval:  401 QTC Calculation: 520 R Axis:   74  Text Interpretation: Sinus tachycardia Biatrial enlargement Abnormal R-wave progression, late transition Probable left ventricular hypertrophy Nonspecific T abnrm, anterolateral leads Prolonged QT interval No significant  change since last tracing Confirmed by Elayne Snare ([id number]) on 11/01/2023 9:01:37 AM  Radiology DG Chest 2 View  Result Date: 11/01/2023 CLINICAL DATA:  57 year old female with shortness of breath. Productive cough. COPD. EXAM: CHEST - 2 VIEW COMPARISON:  Chest radiograph 06/12/2023 and earlier. Chest CTA 06/13/2023. FINDINGS: Portable AP semi upright view at 0815 hours. Centrilobular emphysema on CTA this year. Mildly lower lung volumes now. Mediastinal contours are stable, within normal limits. No pneumothorax, pulmonary edema, pleural effusion or consolidation. Nipple shadow left lower lung. No convincing acute pulmonary opacity. Negative visible bowel gas. No acute osseous abnormality identified. IMPRESSION: Emphysema.  No acute cardiopulmonary abnormality. Electronically Signed   By: Odessa Fleming M.D.   On: 11/01/2023 08:28    Procedures Procedures    Medications Ordered  in ED Medications  ipratropium-albuterol (DUONEB) 0.5-2.5 (3) MG/3ML nebulizer solution 3 mL (3 mLs Nebulization Given 11/01/23 0747)  magnesium sulfate IVPB 1 g 100 mL (0 g Intravenous Stopped 11/01/23 0853)  methylPREDNISolone sodium succinate (SOLU-MEDROL) 125 mg/2 mL injection 125 mg (125 mg Intravenous Given 11/01/23 3244)    ED Course/ Medical Decision Making/ A&P                                 Medical Decision Making Amount and/or Complexity of Data Reviewed Labs: ordered. Radiology: ordered.  Risk Prescription drug management.   BP 100/79 (BP Location: Left Arm)   Pulse 95   Temp 98.2 F (36.8 C) (Oral)   SpO2 90%   34:61 AM 57 year old female with significant history of COPD, generalized anxiety disorder, fibromyalgia's, tobacco abuse brought here via EMS from home with complaints of shortness of breath.  Patient states for the past week and a half she has had progressive worsening shortness of breath, wheezing, fatigue, nonproductive cough, and overall not feeling well.  She felt that this is  similar to her COPD exacerbation that she has been hospitalized previously.  She tries using her home nebulizer and rescue inhaler around-the-clock without adequate relief.  She admits to tobacco use but states she has decreased her consumption significantly now down to 3 cigarettes a day.  She feels she needs supplemental oxygen at home but states the [place] has not approved that yet.  She denies any nausea vomiting diarrhea no fever or chills.  On exam, patient is sitting upright appears to be in mild respiratory discomfort but still able to speak in complete sentences.  She has decreased lung sounds on all field.  No overt wheezes rales or rhonchi heard.  She satting at 90% on room air on the monitor.  At this time we will provide patient with DuoNebs, Solu-Medrol, magnesium, and will monitor closely.  -Labs ordered, independently viewed and interpreted by me.  Labs remarkable for BNP elevated at 109. Pt does not appear fluid overload.  -The patient was maintained on a cardiac monitor.  I personally viewed and interpreted the cardiac monitored which showed an underlying rhythm of: NSR -Imaging independently viewed and interpreted by me and I agree with radiologist's interpretation.  Result remarkable for CXR showing emphysema but no acute changes -This patient presents to the ED for concern of sob, this involves an extensive number of treatment options, and is a complaint that carries with it a high risk of complications and morbidity.  The differential diagnosis includes copd exacerbation, asthma exacerbation, viral illness, pneumonitia, ptx, pleural effusion, chf -Co morbidities that complicate the patient evaluation includes copd, emphysema -Treatment includes prednisone, magnesium, duonebs -Reevaluation of the patient after these medicines showed that the patient improved -PCP office notes or outside notes reviewed -Escalation to admission/observation considered: patients feels much better, is  comfortable with discharge, and will follow up with PCP -Prescription medication considered, patient comfortable with prednisone, zpak -Social Determinant of Health considered which includes tobacco use.           Final Clinical Impression(s) / ED Diagnoses Final diagnoses:  COPD exacerbation (HCC)    Rx / DC Orders ED Discharge Orders          Ordered    azithromycin (ZITHROMAX Z-PAK) 250 MG tablet        11/01/23 1300    predniSONE (DELTASONE) 20 MG tablet  11/01/23 1300              Fayrene Helper, PA-C 11/01/23 1304    Theresia Lo [facility] K, DO 11/01/23 1539

## 2023-11-01 NOTE — ED Notes (Signed)
Pulse oximetry while ambulating was 91

## 2023-11-01 NOTE — ED Triage Notes (Signed)
EMS reports from home. COPD complications for past two weeks. 96 on 2L. Albuterol neb administered en route. After neb 100 on RA. Persistent cough with thick sputum.  114/66 HR 100 RR 22 SPO2 (76 on arrival) 96 on 2L

## 2023-11-01 NOTE — Discharge Instructions (Addendum)
Your symptoms likely due to a COPD exacerbation.  Take antibiotic and prednisone as prescribed and follow-up closely with your doctor for the care.  Return if you have any concern.

## 2023-11-04 ENCOUNTER — Ambulatory Visit (HOSPITAL_COMMUNITY)

## 2023-11-11 ENCOUNTER — Ambulatory Visit (HOSPITAL_COMMUNITY)

## 2023-11-13 ENCOUNTER — Ambulatory Visit (HOSPITAL_COMMUNITY)

## 2023-11-18 ENCOUNTER — Ambulatory Visit (HOSPITAL_COMMUNITY)

## 2023-11-20 ENCOUNTER — Ambulatory Visit (HOSPITAL_COMMUNITY)

## 2024-06-23 ENCOUNTER — Encounter (HOSPITAL_COMMUNITY): Payer: Self-pay | Admitting: Emergency Medicine

## 2024-06-23 ENCOUNTER — Inpatient Hospital Stay (HOSPITAL_COMMUNITY)
Admission: EM | Admit: 2024-06-23 | Discharge: 2024-06-27 | DRG: 306 | Disposition: A | Discharge status: Home Health | Attending: Internal Medicine | Admitting: Internal Medicine

## 2024-06-23 ENCOUNTER — Emergency Department (HOSPITAL_COMMUNITY)

## 2024-06-23 DIAGNOSIS — R0609 Other forms of dyspnea: Secondary | ICD-10-CM | POA: Diagnosis present

## 2024-06-23 DIAGNOSIS — Z833 Family history of diabetes mellitus: Secondary | ICD-10-CM

## 2024-06-23 DIAGNOSIS — I352 Nonrheumatic aortic (valve) stenosis with insufficiency: Principal | ICD-10-CM | POA: Diagnosis present

## 2024-06-23 DIAGNOSIS — Z880 Allergy status to penicillin: Secondary | ICD-10-CM

## 2024-06-23 DIAGNOSIS — R627 Adult failure to thrive: Principal | ICD-10-CM | POA: Diagnosis present

## 2024-06-23 DIAGNOSIS — Z634 Disappearance and death of family member: Secondary | ICD-10-CM

## 2024-06-23 DIAGNOSIS — M797 Fibromyalgia: Secondary | ICD-10-CM | POA: Diagnosis present

## 2024-06-23 DIAGNOSIS — Q2381 Bicuspid aortic valve: Secondary | ICD-10-CM

## 2024-06-23 DIAGNOSIS — F331 Major depressive disorder, recurrent, moderate: Secondary | ICD-10-CM | POA: Diagnosis present

## 2024-06-23 DIAGNOSIS — F419 Anxiety disorder, unspecified: Secondary | ICD-10-CM | POA: Diagnosis present

## 2024-06-23 DIAGNOSIS — K222 Esophageal obstruction: Secondary | ICD-10-CM | POA: Diagnosis present

## 2024-06-23 DIAGNOSIS — K2970 Gastritis, unspecified, without bleeding: Secondary | ICD-10-CM | POA: Diagnosis present

## 2024-06-23 DIAGNOSIS — Z681 Body mass index (BMI) 19 or less, adult: Secondary | ICD-10-CM

## 2024-06-23 DIAGNOSIS — F17210 Nicotine dependence, cigarettes, uncomplicated: Secondary | ICD-10-CM | POA: Diagnosis present

## 2024-06-23 DIAGNOSIS — M549 Dorsalgia, unspecified: Secondary | ICD-10-CM | POA: Diagnosis present

## 2024-06-23 DIAGNOSIS — J439 Emphysema, unspecified: Secondary | ICD-10-CM | POA: Diagnosis present

## 2024-06-23 DIAGNOSIS — I351 Nonrheumatic aortic (valve) insufficiency: Secondary | ICD-10-CM

## 2024-06-23 DIAGNOSIS — G8929 Other chronic pain: Secondary | ICD-10-CM | POA: Diagnosis present

## 2024-06-23 DIAGNOSIS — F4310 Post-traumatic stress disorder, unspecified: Secondary | ICD-10-CM | POA: Diagnosis present

## 2024-06-23 DIAGNOSIS — I7300 Raynaud's syndrome without gangrene: Secondary | ICD-10-CM | POA: Diagnosis present

## 2024-06-23 DIAGNOSIS — Z7951 Long term (current) use of inhaled steroids: Secondary | ICD-10-CM

## 2024-06-23 DIAGNOSIS — I062 Rheumatic aortic stenosis with insufficiency: Secondary | ICD-10-CM

## 2024-06-23 DIAGNOSIS — I350 Nonrheumatic aortic (valve) stenosis: Secondary | ICD-10-CM

## 2024-06-23 DIAGNOSIS — Z79899 Other long term (current) drug therapy: Secondary | ICD-10-CM

## 2024-06-23 DIAGNOSIS — Z881 Allergy status to other antibiotic agents status: Secondary | ICD-10-CM

## 2024-06-23 DIAGNOSIS — D7589 Other specified diseases of blood and blood-forming organs: Secondary | ICD-10-CM | POA: Diagnosis present

## 2024-06-23 DIAGNOSIS — Z91014 Allergy to mammalian meats: Secondary | ICD-10-CM

## 2024-06-23 DIAGNOSIS — E43 Unspecified severe protein-calorie malnutrition: Secondary | ICD-10-CM | POA: Diagnosis present

## 2024-06-23 DIAGNOSIS — Z01818 Encounter for other preprocedural examination: Secondary | ICD-10-CM

## 2024-06-23 DIAGNOSIS — J90 Pleural effusion, not elsewhere classified: Secondary | ICD-10-CM | POA: Diagnosis present

## 2024-06-23 DIAGNOSIS — Z888 Allergy status to other drugs, medicaments and biological substances status: Secondary | ICD-10-CM

## 2024-06-23 DIAGNOSIS — Z886 Allergy status to analgesic agent status: Secondary | ICD-10-CM

## 2024-06-23 LAB — COMPREHENSIVE METABOLIC PANEL WITH GFR
ALT: 11 U/L (ref 0–44)
AST: 18 U/L (ref 15–41)
Albumin: 3.8 g/dL (ref 3.5–5.0)
Alkaline Phosphatase: 45 U/L (ref 38–126)
Anion gap: 8 (ref 5–15)
BUN: 13 mg/dL (ref 6–20)
CO2: 25 mmol/L (ref 22–32)
Calcium: 9.8 mg/dL (ref 8.9–10.3)
Chloride: 111 mmol/L (ref 98–111)
Creatinine, Ser: 0.5 mg/dL (ref 0.44–1.00)
GFR, Estimated: >60 mL/min (ref >60)
Glucose, Bld: 117 mg/dL — ABNORMAL HIGH (ref 70–99)
Potassium: 3.9 mmol/L (ref 3.5–5.1)
Sodium: 144 mmol/L (ref 135–145)
Total Bilirubin: 1 mg/dL (ref 0.0–1.2)
Total Protein: 6.7 g/dL (ref 6.5–8.1)

## 2024-06-23 LAB — CBC WITH DIFFERENTIAL/PLATELET
Abs Immature Granulocytes: 0.01 K/uL (ref 0.00–0.07)
Basophils Absolute: 0 K/uL (ref 0.0–0.1)
Basophils Relative: 0 %
Eosinophils Absolute: 0 K/uL (ref 0.0–0.5)
Eosinophils Relative: 0 %
HCT: 43.1 % (ref 36.0–46.0)
Hemoglobin: 13.4 g/dL (ref 12.0–15.0)
Immature Granulocytes: 0 %
Lymphocytes Relative: 15 %
Lymphs Abs: 1 K/uL (ref 0.7–4.0)
MCH: 31.9 pg (ref 26.0–34.0)
MCHC: 31.1 g/dL (ref 30.0–36.0)
MCV: 102.6 fL — ABNORMAL HIGH (ref 80.0–100.0)
Monocytes Absolute: 0.6 K/uL (ref 0.1–1.0)
Monocytes Relative: 9 %
Neutro Abs: 5.1 K/uL (ref 1.7–7.7)
Neutrophils Relative %: 76 %
Platelets: 127 K/uL — ABNORMAL LOW (ref 150–400)
RBC: 4.2 MIL/uL (ref 3.87–5.11)
RDW: 12.6 % (ref 11.5–15.5)
WBC: 6.8 K/uL (ref 4.0–10.5)
nRBC: 0 % (ref 0.0–0.2)

## 2024-06-23 LAB — URINALYSIS, ROUTINE W REFLEX MICROSCOPIC
Bilirubin Urine: NEGATIVE
Glucose, UA: NEGATIVE mg/dL
Hgb urine dipstick: NEGATIVE
Ketones, ur: NEGATIVE mg/dL
Leukocytes,Ua: NEGATIVE
Nitrite: NEGATIVE
Protein, ur: NEGATIVE mg/dL
Specific Gravity, Urine: >1.046 — ABNORMAL HIGH (ref 1.005–1.030)
pH: 7 (ref 5.0–8.0)

## 2024-06-23 LAB — MAGNESIUM: Magnesium: 2.1 mg/dL (ref 1.7–2.4)

## 2024-06-23 LAB — PHOSPHORUS: Phosphorus: 2.7 mg/dL (ref 2.5–4.6)

## 2024-06-23 MED ORDER — FENTANYL CITRATE PF 50 MCG/ML IJ SOSY
25.0000 ug | PREFILLED_SYRINGE | Freq: Once | INTRAMUSCULAR | Status: AC
  Administered 2024-06-23: 25 ug via INTRAVENOUS
  Filled 2024-06-23: qty 1

## 2024-06-23 MED ORDER — ALBUTEROL SULFATE HFA 108 (90 BASE) MCG/ACT IN AERS
2.0000 | INHALATION_SPRAY | Freq: Every day | RESPIRATORY_TRACT | Status: DC | PRN
  Administered 2024-06-25: 2 via RESPIRATORY_TRACT
  Filled 2024-06-23: qty 6.7

## 2024-06-23 MED ORDER — PANTOPRAZOLE SODIUM 40 MG PO TBEC
40.0000 mg | DELAYED_RELEASE_TABLET | Freq: Every day | ORAL | Status: DC
  Administered 2024-06-24 – 2024-06-27 (×3): 40 mg via ORAL
  Filled 2024-06-23 (×4): qty 1

## 2024-06-23 MED ORDER — LACTATED RINGERS IV BOLUS
1000.0000 mL | Freq: Once | INTRAVENOUS | Status: AC
  Administered 2024-06-23: 1000 mL via INTRAVENOUS

## 2024-06-23 MED ORDER — IOHEXOL 300 MG/ML  SOLN
100.0000 mL | Freq: Once | INTRAMUSCULAR | Status: AC | PRN
  Administered 2024-06-23: 100 mL via INTRAVENOUS

## 2024-06-23 NOTE — ED Triage Notes (Signed)
 Patient comes in with family via ems. Daughter states patient has complaints of shortness of breath at times, failure to thrive, hx of copd, current weight is down to 88lbs.

## 2024-06-23 NOTE — ED Notes (Signed)
 Patient is getting upset in triage because this nurse will not put her on oxygen because her oxygen was 100% with ems and 95 % here. Patient starts raising her voice requesting fluids. Explained to patient at this time, unable to give fluids because she is in triage and we will get her back as soon as possible.

## 2024-06-23 NOTE — TOC Initial Note (Signed)
 Transition of Care [facility]) - Initial/Assessment Note    Patient Details  Name: Michele Lucas MRN: [id number] Date of Birth: 27-Jul-1966  Transition of Care [facility]) CM/SW Contact:    Hartley KATHEE Robertson, LCSWA Phone Number: 06/23/2024, 7:27 PM  Clinical Narrative:                  CSW received consult for SNF/DME, spoke with pt's daughter and caretaker Brionna via phone, she states she stepped away from the hospital. Geraldene states she has concerns for pt's medical status as pt has been unable to gain weight and has been in pain. Geraldene states she would like to speak with MD about pt's medical status and ensure complete workup is done before speaking about dc options such as SNF. TOC will continue to follow and await PT/OT results.        Patient Goals and CMS Choice            Expected Discharge Plan and Services                                              Prior Living Arrangements/Services                       Activities of Daily Living      Permission Sought/Granted                  Emotional Assessment              Admission diagnosis:  dehydration Patient Active Problem List   Diagnosis Date Noted   Hemoptysis 06/13/2023   Acute respiratory failure with hypoxia and hypercapnia (HCC) 05/24/2022   COPD (chronic obstructive pulmonary disease) with acute bronchitis (HCC) 11/28/2021   Malnutrition of moderate degree 11/27/2021   COPD with acute exacerbation (HCC) 11/26/2021   Underweight 11/26/2021   Grade I diastolic dysfunction 11/26/2021   Protein-calorie malnutrition, severe 04/04/2018   Nicotine  abuse 04/04/2018   Acute respiratory failure with hypoxia (HCC) 04/03/2018   Acute bronchitis with COPD (HCC) 04/03/2018   PTSD (post-traumatic stress disorder) 04/03/2018   Neuropathic pain 04/03/2018   Fibromyalgia 04/03/2018   Chronic back pain 04/03/2018   Anxiety 04/03/2018   Thrombocytopenia (HCC) 04/03/2018   Acute hyponatremia  04/03/2018   Elevated lactic acid level 04/03/2018   Protein calorie malnutrition (HCC) 04/03/2018   Pneumothorax on left 10/06/2016   Chronic pain syndrome 04/02/2015   Generalized anxiety disorder 04/02/2015   Hypokalemia 04/02/2015   Normocytic anemia 04/02/2015   Abnormal urinalysis 04/02/2015   SOB (shortness of breath)    CAP (community acquired pneumonia) 04/01/2015   COPD exacerbation (HCC) 04/01/2015   PCP:  Phyllistine Lenis, MD Pharmacy:   [id number] - [place], [place] - [address] [place] [address] Phone: [phone] Fax: [phone]  [id number] - [place]),  - [address] [place] ([place]) [place] [address] Phone: [phone] Fax: [phone]  [facility] [id number] [place], [place] - [address] [place] [address] Phone: [phone] Fax: [phone]     Social Drivers of Health (SDOH) Social History: SDOH Screenings   Food Insecurity: No Food Insecurity (06/13/2023)  Housing: Low Risk  (06/13/2023)  Transportation Needs: No Transportation Needs (06/13/2023)  Utilities: Not At Risk (06/13/2023)  Tobacco Use:  High Risk (06/23/2024)   SDOH Interventions:     Readmission Risk Interventions    05/27/2022    4:29 PM 05/27/2022    4:26 PM  Readmission Risk Prevention Plan  Transportation Screening Complete Complete  PCP or Specialist Appt within 3-5 Days Complete Complete  HRI or Home Care Consult Complete Complete  Social Work Consult for Recovery Care Planning/Counseling Complete Complete  Palliative Care Screening Complete Complete  Medication Review Oceanographer) Complete Complete

## 2024-06-23 NOTE — ED Provider Notes (Addendum)
 [place] EMERGENCY DEPARTMENT AT [facility] Provider Note   CSN: [id number] Arrival date & time: 06/23/24  1153     Patient presents with: No chief complaint on file.   Michele Lucas is a 58 y.o. female.   This is a 58 year old female presenting emergency department with multiple complaints all seemingly subacute/chronic.  Has had decreased appetite for some time and has had progressive weight loss.  Complaining of stomach pain and feeling as though it is in knots for the past month or so.  Pain all over.  No specific spot.  No associated nausea vomiting or diarrhea.  Has had increased weakness, but is able to get out of bed ambulate to the bathroom.  But has progressively gotten harder to do so.  Being primarily seen by the [facility], and a nutritionist there told family members that she was critically malnourished.  Patient has been drinking ensures.  Has lost reportedly 10 pounds over the past 3 months.        Prior to Admission medications   Medication Sig Start Date End Date Taking? Authorizing Provider  albuterol  (PROVENTIL  HFA;VENTOLIN  HFA) 108 (90 Base) MCG/ACT inhaler Inhale 2 puffs into the lungs 5 (five) times daily as needed for wheezing or shortness of breath.     [provider]  albuterol  (PROVENTIL ) (2.5 MG/3ML) 0.083% nebulizer solution Take 3 mLs (2.5 mg total) by nebulization every 6 (six) hours as needed for wheezing or shortness of breath. 01/07/22   Kehrli, Kelsey F, PA-C  azithromycin  (ZITHROMAX  Z-PAK) 250 MG tablet 2 po day one, then 1 daily x 4 days 11/01/23   Nivia Colon, PA-C  benzonatate  (TESSALON ) 200 MG capsule Take 1 capsule (200 mg total) by mouth 3 (three) times daily as needed for cough. 05/27/22   Sheikh, Omair Latif, DO  fluticasone-salmeterol (WIXELA INHUB) 500-50 MCG/ACT AEPB Inhale 1 puff into the lungs in the morning and at bedtime.    [provider]  hydrOXYzine  (ATARAX ) 25 MG tablet Take 25 mg by mouth in the morning, at  noon, in the evening, and at bedtime.    [provider]  lactose free nutrition (BOOST PLUS) LIQD Take 237 mLs by mouth 3 (three) times daily with meals. 05/27/22   Sherrill Cable Latif, DO  magnesium  oxide (MAG-OX) 400 (240 Mg) MG tablet Take 400 mg by mouth 2 (two) times daily.    [provider]  mirtazapine  (REMERON ) 15 MG tablet Take 15 mg by mouth in the morning and at bedtime. 04/01/23   [provider]  Multiple Vitamin (MULTIVITAMIN WITH MINERALS) TABS tablet Take 1 tablet by mouth daily. Patient not taking: Reported on 06/13/2023 05/28/22   Sherrill Cable Latif, DO  oxyCODONE  (OXY IR/ROXICODONE ) 5 MG immediate release tablet Take 1 tablet (5 mg total) by mouth 3 (three) times daily as needed for severe pain. 06/15/23   Drusilla Sabas RAMAN, MD  pantoprazole  (PROTONIX ) 40 MG tablet Take 1 tablet (40 mg total) by mouth daily. 06/15/23 08/14/23  Drusilla Sabas RAMAN, MD  predniSONE  (DELTASONE ) 20 MG tablet 3 tabs po day one, then 2 tabs daily x 4 days 11/01/23   Nivia Colon, PA-C  Tiotropium Bromide  Monohydrate (SPIRIVA  RESPIMAT) 1.25 MCG/ACT AERS Inhale 2 each into the lungs daily.    [provider]    Allergies: Guaifenesin & derivatives, Cymbalta [duloxetine hcl], Ibuprofen , Neurontin [gabapentin], Pork-derived products, Prozac [fluoxetine hcl], Tylenol  [acetaminophen ], Ampicillin, Clindamycin /lincomycin, and Penicillins    Review of Systems  Updated Vital Signs  BP 126/81   Pulse 86   Temp 98.8 F (37.1 C)   Resp 20   Ht 5' 2 (1.575 m)   Wt 44.1 kg   SpO2 99%   BMI 17.78 kg/m   Physical Exam Vitals and nursing note reviewed.  Constitutional:      Comments: Frail-appearing  HENT:     Nose: Nose normal.     Mouth/Throat:     Mouth: Mucous membranes are moist.  Eyes:     Conjunctiva/sclera: Conjunctivae normal.  Cardiovascular:     Rate and Rhythm: Normal rate and regular rhythm.  Pulmonary:     Effort: Pulmonary effort is normal.     Breath sounds: Normal  breath sounds.  Abdominal:     General: Abdomen is flat. There is no distension.     Tenderness: There is no abdominal tenderness. There is no guarding or rebound.  Musculoskeletal:     Right lower leg: No edema.     Left lower leg: No edema.  Skin:    General: Skin is warm and dry.     Capillary Refill: Capillary refill takes less than 2 seconds.  Neurological:     Mental Status: She is alert and oriented to person, place, and time.  Psychiatric:        Mood and Affect: Mood normal.        Behavior: Behavior normal.     (all labs ordered are listed, but only abnormal results are displayed) Labs Reviewed  CBC WITH DIFFERENTIAL/PLATELET - Abnormal; Notable for the following components:      Result Value   MCV 102.6 (*)    Platelets 127 (*)    All other components within normal limits  COMPREHENSIVE METABOLIC PANEL WITH GFR - Abnormal; Notable for the following components:   Glucose, Bld 117 (*)    All other components within normal limits  URINALYSIS, ROUTINE W REFLEX MICROSCOPIC - Abnormal; Notable for the following components:   APPearance HAZY (*)    Specific Gravity, Urine >1.046 (*)    All other components within normal limits  MAGNESIUM   PHOSPHORUS    EKG: None  Radiology: DG Chest 2 View Result Date: 06/23/2024 CLINICAL DATA:  Shortness of breath EXAM: CHEST - 2 VIEW COMPARISON:  Chest x-ray 11/01/2023.  CT of the chest 06/13/2023 FINDINGS: The heart size and mediastinal contours are within normal limits. Nodular density projects over the left costophrenic angle similar to the prior study. This may represent a nipple shadow. The lungs are otherwise clear there is no pleural effusion or pneumothorax. The lungs are hyperinflated, unchanged. The visualized skeletal structures are unremarkable. IMPRESSION: 1. No active cardiopulmonary disease. 2. Hyperinflated lungs, unchanged. 3. Nodular density projects over the left costophrenic angle similar to the prior study. This may  represent a nipple shadow. Repeat chest x-ray with nipple markers is recommended. Electronically Signed   By: Greig Pique M.D.   On: 06/23/2024 16:43   CT ABDOMEN PELVIS W CONTRAST Result Date: 06/23/2024 CLINICAL DATA:  Bowel obstruction suspected. Shortness of breath. Failure to thrive. Weight loss. EXAM: CT ABDOMEN AND PELVIS WITH CONTRAST TECHNIQUE: Multidetector CT imaging of the abdomen and pelvis was performed using the standard protocol following bolus administration of intravenous contrast. RADIATION DOSE REDUCTION: This exam was performed according to the departmental dose-optimization program which includes automated exposure control, adjustment of the mA and/or kV according to patient size and/or use of iterative reconstruction technique. CONTRAST:  OMNIPAQUE  IOHEXOL  300 MG/ML  SOLN COMPARISON:  CT scan abdomen and pelvis from 02/13/2022. FINDINGS: Lower chest: There are patchy atelectatic changes in the visualized lung bases. No overt consolidation. No pleural effusion. The heart is normal in size. No pericardial effusion. Hepatobiliary: The liver is normal in size. Non-cirrhotic configuration. No suspicious mass. No intrahepatic or extrahepatic bile duct dilation. Gallbladder is surgically absent. Pancreas: Unremarkable. No pancreatic ductal dilatation or surrounding inflammatory changes. Spleen: Within normal limits. No focal lesion. Adrenals/Urinary Tract: Adrenal glands are unremarkable. No suspicious renal mass. There are several hypoattenuating structures in the left kidney with largest measuring up to 8 x 12 mm, which can be characterized as a simple cyst. There are at least 2, 1-1.5 mm nonobstructing calculi in the left kidney and a single 1-1.5 mm nonobstructing calculus in the right kidney lower pole. No hydroureteronephrosis or ureterolithiasis on either side. Urinary bladder is under distended, precluding optimal assessment. However, no large mass or stones identified. No  perivesical fat stranding. Stomach/Bowel: No disproportionate dilation of the small or large bowel loops. No evidence of abnormal bowel wall thickening or inflammatory changes. The appendix was not visualized; however there is no acute inflammatory process in the right lower quadrant. Vascular/Lymphatic: No ascites or pneumoperitoneum. No abdominal or pelvic lymphadenopathy, by size criteria. No aneurysmal dilation of the major abdominal arteries. There are moderate peripheral atherosclerotic vascular calcifications of the aorta and its major branches. Reproductive: Not well evaluated on this CT scan exam. There is probable retroverted uterus noted. No large adnexal mass seen. Other: The visualized soft tissues and abdominal wall are unremarkable. Musculoskeletal: No suspicious osseous lesions. There are mild multilevel degenerative changes in the visualized spine. IMPRESSION: 1. No acute inflammatory process identified within the abdomen or pelvis. No bowel obstruction. 2. Multiple other nonacute observations, as described above. Aortic Atherosclerosis (ICD10-I70.0). Electronically Signed   By: Ree Molt M.D.   On: 06/23/2024 16:39     Procedures   Medications Ordered in the ED  albuterol  (VENTOLIN  HFA) 108 (90 Base) MCG/ACT inhaler 2 puff (has no administration in time range)  pantoprazole  (PROTONIX ) EC tablet 40 mg (has no administration in time range)  fentaNYL  (SUBLIMAZE ) injection 25 mcg (25 mcg Intravenous Given 06/23/24 1637)  lactated ringers  bolus 1,000 mL (1,000 mLs Intravenous New Bag/Given 06/23/24 1639)  iohexol  (OMNIPAQUE ) 300 MG/ML solution 100 mL (100 mLs Intravenous Contrast Given 06/23/24 1608)    Clinical Course as of 06/23/24 2325  Wed Jun 23, 2024  1505 Per my independent chart review.  Normal CT abdomen pelvis in 2023.  Normal CTA chest in 2024 [TY]    Clinical Course User Index [TY] Neysa Caron PARAS, DO                                 Medical Decision Making This is a  58 year old female presenting emergency department with generalized weakness and failure to thrive.  She is afebrile, mildly elevated heart rate on arrival, but improved without intervention.  Hemodynamic stable.  Maintaining oxygen saturation on room air.  Clinically does not appear to be in distress, but does appear frail.  Current weight 44.1 kg..  She is tolerating p.o. with no nausea or vomiting.  Symptoms are seemingly subacute and have been progressive over the past several months.  Workup reassuring she has no fever or leukocytosis to suggest systemic infection.  No anemia to explain her weakness.  Comprehensive panel without dehydration or electrolyte abnormalities.  Normal kidney function.  Her total protein and albumin would not suggest severe malnutrition.  No transaminitis to suggest hepatobiliary disease.  Her magnesium  and phosphorus level normal.  UA without ketones, no urinary tract infection.  Given her complaint of worsening abdominal pain, did get CT abdomen pelvis.  No acute findings.  CT chest without pneumonia pneumothorax on my independent rotation.  EKG appears to be sinus rhythm.  Symptoms less likely cardiac in origin.  Discussed close follow-up with family, but they feel that she will not do well at home and continue to deteriorate.  Do not feel that she meets inpatient criteria. As patient and family do not feel safe going home, discussed TOC with possible placement versus home with home health.  Patient and family seemed interested either option. Will place in boarding status.  Amount and/or Complexity of Data Reviewed Labs: ordered. Radiology: ordered. ECG/medicine tests: ordered.  Risk Prescription drug management.      Final diagnoses:  Failure to thrive in adult    ED Discharge Orders     None          Neysa Caron PARAS, DO 06/23/24 1949    Neysa Caron PARAS, DO 06/23/24 2325

## 2024-06-24 ENCOUNTER — Observation Stay (HOSPITAL_COMMUNITY)

## 2024-06-24 ENCOUNTER — Other Ambulatory Visit: Payer: Self-pay

## 2024-06-24 ENCOUNTER — Emergency Department (HOSPITAL_COMMUNITY)

## 2024-06-24 DIAGNOSIS — R079 Chest pain, unspecified: Secondary | ICD-10-CM

## 2024-06-24 DIAGNOSIS — R0609 Other forms of dyspnea: Secondary | ICD-10-CM | POA: Diagnosis not present

## 2024-06-24 DIAGNOSIS — R0602 Shortness of breath: Secondary | ICD-10-CM

## 2024-06-24 LAB — ECHOCARDIOGRAM COMPLETE
AR max vel: 1.96 cm2
AV Area VTI: 1.81 cm2
AV Area mean vel: 1.81 cm2
AV Mean grad: 25 mmHg
AV Peak grad: 41.1 mmHg
Ao pk vel: 3.2 m/s
Area-P 1/2: 3.37 cm2
Calc EF: 67.8 %
Height: 62 in
MV M vel: 4.91 m/s
MV Peak grad: 96.4 mmHg
P 1/2 time: 310 ms
S' Lateral: 2.4 cm
Single Plane A2C EF: 64.4 %
Single Plane A4C EF: 69.1 %
Weight: 1555.57 [oz_av]

## 2024-06-24 LAB — FOLATE: Folate: 15.8 ng/mL (ref >5.9)

## 2024-06-24 LAB — TSH: TSH: 0.361 u[IU]/mL (ref 0.350–4.500)

## 2024-06-24 LAB — TROPONIN I (HIGH SENSITIVITY)
Troponin I (High Sensitivity): 3 ng/L (ref <18)
Troponin I (High Sensitivity): 3 ng/L (ref <18)

## 2024-06-24 LAB — VITAMIN B12: Vitamin B-12: 345 pg/mL (ref 180–914)

## 2024-06-24 LAB — CORTISOL-AM, BLOOD: Cortisol - AM: 14.2 ug/dL (ref 6.7–22.6)

## 2024-06-24 MED ORDER — LOPERAMIDE HCL 2 MG PO CAPS
4.0000 mg | ORAL_CAPSULE | Freq: Once | ORAL | Status: AC
  Administered 2024-06-24: 4 mg via ORAL
  Filled 2024-06-24: qty 2

## 2024-06-24 MED ORDER — SODIUM CHLORIDE 0.9 % IV SOLN: INTRAVENOUS | Status: DC

## 2024-06-24 MED ORDER — IOHEXOL 350 MG/ML SOLN
60.0000 mL | Freq: Once | INTRAVENOUS | Status: AC | PRN
  Administered 2024-06-24: 60 mL via INTRAVENOUS

## 2024-06-24 MED ORDER — GLUCERNA SHAKE PO LIQD
237.0000 mL | Freq: Three times a day (TID) | ORAL | Status: DC
  Administered 2024-06-24 – 2024-06-25 (×3): 237 mL via ORAL
  Filled 2024-06-24 (×5): qty 237

## 2024-06-24 MED ORDER — MELATONIN 5 MG PO TABS
5.0000 mg | ORAL_TABLET | Freq: Every evening | ORAL | Status: DC | PRN
  Administered 2024-06-24 – 2024-06-25 (×2): 5 mg via ORAL
  Filled 2024-06-24 (×2): qty 1

## 2024-06-24 MED ORDER — METHOCARBAMOL 500 MG PO TABS
500.0000 mg | ORAL_TABLET | Freq: Four times a day (QID) | ORAL | Status: DC | PRN
  Administered 2024-06-24 – 2024-06-27 (×10): 500 mg via ORAL
  Filled 2024-06-24 (×10): qty 1

## 2024-06-24 MED ORDER — METHYLPREDNISOLONE SODIUM SUCC 125 MG IJ SOLR
125.0000 mg | Freq: Once | INTRAMUSCULAR | Status: AC
  Administered 2024-06-24: 125 mg via INTRAVENOUS
  Filled 2024-06-24: qty 2

## 2024-06-24 MED ORDER — KETOROLAC TROMETHAMINE 15 MG/ML IJ SOLN
15.0000 mg | Freq: Once | INTRAMUSCULAR | Status: AC
  Administered 2024-06-24: 15 mg via INTRAVENOUS
  Filled 2024-06-24: qty 1

## 2024-06-24 MED ORDER — IPRATROPIUM-ALBUTEROL 0.5-2.5 (3) MG/3ML IN SOLN
3.0000 mL | Freq: Once | RESPIRATORY_TRACT | Status: AC
  Administered 2024-06-24: 3 mL via RESPIRATORY_TRACT
  Filled 2024-06-24: qty 3

## 2024-06-24 MED ORDER — HYDRALAZINE HCL 20 MG/ML IJ SOLN: 10.0000 mg | Freq: Four times a day (QID) | INTRAMUSCULAR | Status: DC | PRN

## 2024-06-24 MED ORDER — POLYETHYLENE GLYCOL 3350 17 G PO PACK: 17.0000 g | PACK | Freq: Every day | ORAL | Status: DC | PRN

## 2024-06-24 MED ORDER — OXYCODONE HCL 5 MG PO TABS
5.0000 mg | ORAL_TABLET | Freq: Four times a day (QID) | ORAL | Status: DC | PRN
  Administered 2024-06-24 – 2024-06-26 (×8): 5 mg via ORAL
  Filled 2024-06-24 (×8): qty 1

## 2024-06-24 MED ORDER — OXYCODONE HCL 5 MG PO TABS
5.0000 mg | ORAL_TABLET | Freq: Three times a day (TID) | ORAL | Status: DC | PRN
  Administered 2024-06-24: 5 mg via ORAL
  Filled 2024-06-24: qty 1

## 2024-06-24 MED ORDER — BISACODYL 5 MG PO TBEC: 5.0000 mg | DELAYED_RELEASE_TABLET | Freq: Every day | ORAL | Status: DC | PRN

## 2024-06-24 NOTE — ED Notes (Addendum)
 Pt c/o chest pain to this RN. C. Horton notified and instructed this RN to obtain EKG. EKG completed and handed to C. Horton.

## 2024-06-24 NOTE — Progress Notes (Signed)
 Pt not medically stable for SNF workup at this time. TOC will continue to follow.

## 2024-06-24 NOTE — Progress Notes (Signed)
   06/24/24 9177  PT Visit Information  Last PT Received On 06/24/24  Reason Eval/Treat Not Completed Patient not medically ready  History of Present Illness Michele Lucas is a 58 year old female presenting emergency department with multiple complaints all seemingly subacute/chronic.  Has had decreased appetite for some time and has had progressive weight loss.  Complaining of stomach pain and feeling as though it is in knots for the past month or so.  Pain all over.  No specific spot.  No associated nausea vomiting or diarrhea.  Has had increased weakness, but is able to get out of bed ambulate to the bathroom.  But has progressively gotten harder to do so.  Being primarily seen by the [facility], and a nutritionist there told family members that she was critically malnourished.  Patient has been drinking ensures.  Has lost reportedly 10 pounds over the past 3 months.   Discussed with MD, further medical workup indicated. Will follow up.  Stann, PT Acute Rehabilitation Services Office: [phone] 06/24/2024

## 2024-06-24 NOTE — H&P (Signed)
 [facility] History and Physical  Michele Lucas:[id number] DOB: July 22, 1966 DOA: 06/23/2024 PCP: Phyllistine Lenis, MD  Presented from: Home Chief Complaint: Failure to thrive, chest pain  History of Present Illness: Michele Lucas is a 58 y.o. female with PMH significant for COPD, emphysema, no longer smoking, Raynaud's syndrome, fibromyalgia, chronic back pain while serving in the [employer], PTSD. 7/16, patient was brought to the ED by family with complaint of shortness of breath, generalized weakness, failure to thrive.  At the time of my evaluation, patient was in bed.  Alert, awake, oriented x 3 and able to give details of the history.  Very sad affect and tearful at times.  Family not at bedside Patient states since her husband died 5 years ago, she has been in grief, lost significant weight and never been able to 'come back normal'. Over the years, she has lost about 88 pounds. Lives at home alone, daughter lives nearby. Patient complains of some dull aching epigastric pain all the time but denies any nausea, vomiting, diarrhea, difficulty swallowing or painful swallowing. Patient has fibromyalgia and is on oxycodone  3-4 times a day. Lately, she has also developed dyspnea more on exertion. She states she has been asking for some help at the [facility] but not been able to get seen.  With worsening symptoms, patient was brought to the ED.  In the ED, patient was afebrile, hemodynamically stable, not requiring supplemental oxygen Labs showed WC count 6.8, hemoglobin 13.4, MCV 103, sodium 144, renal functional normal, electrolytes normal enzymes normal, troponin normal Urinalysis showed hazy yellow color urine  CT angio chest without any evidence of pulm embolism or acute cardiopulmonary abnormality.   CT abdomen pelvis did not show any acute inflammatory process identified within the abdomen or pelvis. No bowel obstruction.  Review of Systems:  All systems were reviewed and were negative  unless otherwise mentioned in the HPI   Past medical history: Past Medical History:  Diagnosis Date   Anxiety    Chronic back pain    COPD, mild (HCC)    Emphysema lung (HCC)    Endometriosis    Fibromyalgia    Grade I diastolic dysfunction 11/26/2021   Kidney stones    Neuropathic pain    PTSD (post-traumatic stress disorder)     Past surgical history: Past Surgical History:  Procedure Laterality Date   CESAREAN SECTION     copolscopy     TONSILLECTOMY     TUBAL LIGATION      Social History:  reports that she quit smoking about 2 years ago. Her smoking use included cigarettes. She uses smokeless tobacco. She reports that she does not drink alcohol and does not use drugs.  Allergies:  Allergies  Allergen Reactions   Guaifenesin & Derivatives Shortness Of Breath    Mucinex brand   Cymbalta [Duloxetine Hcl] Other (See Comments)    Unknown    Ibuprofen  Other (See Comments)    Upset stomach   Neurontin [Gabapentin] Other (See Comments)    Reported by [facility] - slowed heart rate per pt   Pork-Derived Products Other (See Comments)    Patient prefers to not eat any pork/swine    Prozac [Fluoxetine Hcl] Other (See Comments)    Loss of appetite, sedation   Tylenol  [Acetaminophen ] Other (See Comments)    Upset stomach   Ampicillin Hives and Rash   Clindamycin /Lincomycin Swelling and Rash    Have these reactions in feet, ankles and legs.   Penicillins Hives and  Rash   Guaifenesin & derivatives, Cymbalta [duloxetine hcl], Ibuprofen , Neurontin [gabapentin], Pork-derived products, Prozac [fluoxetine hcl], Tylenol  [acetaminophen ], Ampicillin, Clindamycin /lincomycin, and Penicillins   Family history:  Family History  Problem Relation Age of Onset   Diabetes type II Other    Stroke Neg Hx     Physical Exam: Vitals:   06/24/24 0300 06/24/24 0552 06/24/24 1200 06/24/24 1232  BP:  126/78 115/79 119/71  Pulse: 81 85 83 92  Resp:  20 20 16   Temp:   98.3 F (36.8 C)  98.5 F (36.9 C)  TempSrc:  Oral  Oral  SpO2: 100% 100% 100% 100%  Weight:      Height:       Wt Readings from Last 3 Encounters:  06/23/24 44.1 kg  11/01/23 44.1 kg  06/13/23 44.1 kg   Body mass index is 17.78 kg/m.  General exam: Pleasant, middle-aged African-American female.  Thin built. Skin: No rashes, lesions or ulcers. HEENT: Atraumatic, normocephalic, no obvious bleeding Lungs: Clear to auscultation bilaterally,  CVS: S1, S2, no murmur,   GI/Abd: Soft, tender epigastrium, nondistended, bowel sound present,   CNS: Alert, awake, oriented x 3 Psychiatry: Sad affect, tearful Extremities: No pedal edema, no calf tenderness,    ----------------------------------------------------------------------------------------------------------------------------------------- ----------------------------------------------------------------------------------------------------------------------------------------- -----------------------------------------------------------------------------------------------------------------------------------------  Assessment/Plan: Principal Problem:   Dyspnea on exertion  Dyspnea on exertion Chest pain Likely part of failure to thrive, progressive deconditioning Troponin negative.  EKG unremarkable  CT angio chest unremarkable for PE or any other acute cardiopulmonary issues Obtain echocardiogram  Generalized weakness H/o fibromyalgia, PTSD Given history of fibromyalgia and persistence of symptoms since her husband died, I would suspect severe depression as the cause. Obtain TSH, cortisol, vitamin B12 and folic acid level. Obtain psychiatry consultation  Chronic back pain As needed oxycodone   Significant weight loss Severe malnutrition Epigastric tenderness Denies any dysphagia, odynophagia, nausea vomiting diarrhea but has mild to moderate tenderness in the epigastrium. CT abdomen pelvis did not show any acute  inflammatory process or bowel obstruction. Hemoglobin stable. Obtain barium esophagram at the least. Wants to try soft diet.  Dietitian consult  Macrocytosis Normal hemoglobin level. Obtain folic acid and vitamin B12 Recent Labs    11/01/23 1200 06/23/24 1328  HGB 12.8 13.4  MCV 102.2* 102.6*   Mobility: PT eval  Goals of care:   Code Status: Full Code    DVT prophylaxis: Allergy to pork products SCDs Start: 06/24/24 1131   Antimicrobials: None Fluid: NS at 75 mL/h Consultants: Psychiatry called Family Communication: None at bedside  Status: Observation Level of care:  Telemetry   Patient is from: Home Anticipated d/c to: Pending clinical course  Diet:  Diet Order             DIET SOFT Fluid consistency: Thin  Diet effective now                    ------------------------------------------------------------------------------------- Severity of Illness: The appropriate patient status for this patient is OBSERVATION. Observation status is judged to be reasonable and necessary in order to provide the required intensity of service to ensure the patient's safety. The patient's presenting symptoms, physical exam findings, and initial radiographic and laboratory data in the context of their medical condition is felt to place them at decreased risk for further clinical deterioration. Furthermore, it is anticipated that the patient will be medically stable for discharge from the hospital within 2 midnights of admission.  -------------------------------------------------------------------------------------   Home Meds: Prior to Admission medications   Medication Sig Start  Date End Date Taking? Authorizing Provider  albuterol  (PROVENTIL  HFA;VENTOLIN  HFA) 108 (90 Base) MCG/ACT inhaler Inhale 2 puffs into the lungs 5 (five) times daily as needed for wheezing or shortness of breath.     [provider]  albuterol  (PROVENTIL ) (2.5 MG/3ML) 0.083% nebulizer solution  Take 3 mLs (2.5 mg total) by nebulization every 6 (six) hours as needed for wheezing or shortness of breath. 01/07/22   Kehrli, Kelsey F, PA-C  azithromycin  (ZITHROMAX  Z-PAK) 250 MG tablet 2 po day one, then 1 daily x 4 days 11/01/23   Nivia Colon, PA-C  benzonatate  (TESSALON ) 200 MG capsule Take 1 capsule (200 mg total) by mouth 3 (three) times daily as needed for cough. 05/27/22   Sheikh, Omair Latif, DO  fluticasone-salmeterol (WIXELA INHUB) 500-50 MCG/ACT AEPB Inhale 1 puff into the lungs in the morning and at bedtime.    [provider]  hydrOXYzine  (ATARAX ) 25 MG tablet Take 25 mg by mouth in the morning, at noon, in the evening, and at bedtime.    [provider]  lactose free nutrition (BOOST PLUS) LIQD Take 237 mLs by mouth 3 (three) times daily with meals. 05/27/22   Sherrill Cable Latif, DO  magnesium  oxide (MAG-OX) 400 (240 Mg) MG tablet Take 400 mg by mouth 2 (two) times daily.    [provider]  mirtazapine  (REMERON ) 15 MG tablet Take 15 mg by mouth in the morning and at bedtime. 04/01/23   [provider]  Multiple Vitamin (MULTIVITAMIN WITH MINERALS) TABS tablet Take 1 tablet by mouth daily. Patient not taking: Reported on 06/13/2023 05/28/22   Sherrill Cable Latif, DO  oxyCODONE  (OXY IR/ROXICODONE ) 5 MG immediate release tablet Take 1 tablet (5 mg total) by mouth 3 (three) times daily as needed for severe pain. 06/15/23   Drusilla Sabas RAMAN, MD  pantoprazole  (PROTONIX ) 40 MG tablet Take 1 tablet (40 mg total) by mouth daily. 06/15/23 08/14/23  Drusilla Sabas RAMAN, MD  predniSONE  (DELTASONE ) 20 MG tablet 3 tabs po day one, then 2 tabs daily x 4 days 11/01/23   Nivia Colon, PA-C  Tiotropium Bromide  Monohydrate (SPIRIVA  RESPIMAT) 1.25 MCG/ACT AERS Inhale 2 each into the lungs daily.    [provider]    Labs on Admission:   CBC: Recent Labs  Lab 06/23/24 1328  WBC 6.8  NEUTROABS 5.1  HGB 13.4  HCT 43.1  MCV 102.6*  PLT 127*    Basic Metabolic  Panel: Recent Labs  Lab 06/23/24 1328  NA 144  K 3.9  CL 111  CO2 25  GLUCOSE 117*  BUN 13  CREATININE 0.50  CALCIUM 9.8  MG 2.1  PHOS 2.7    Liver Function Tests: Recent Labs  Lab 06/23/24 1328  AST 18  ALT 11  ALKPHOS 45  BILITOT 1.0  PROT 6.7  ALBUMIN 3.8   No results for input(s): LIPASE, AMYLASE in the last 168 hours. No results for input(s): AMMONIA in the last 168 hours.  Cardiac Enzymes: No results for input(s): CKTOTAL, CKMB, CKMBINDEX, TROPONINI in the last 168 hours.  BNP (last 3 results) Recent Labs    11/01/23 0806  BNP 109.1*    ProBNP (last 3 results) No results for input(s): PROBNP in the last 8760 hours.  CBG: No results for input(s): GLUCAP in the last 168 hours.  Lipase     Component Value Date/Time   LIPASE 29 02/13/2022 2048     Urinalysis    Component Value Date/Time   COLORURINE  YELLOW 06/23/2024 1816   APPEARANCEUR HAZY (A) 06/23/2024 1816   LABSPEC >1.046 (H) 06/23/2024 1816   PHURINE 7.0 06/23/2024 1816   GLUCOSEU NEGATIVE 06/23/2024 1816   HGBUR NEGATIVE 06/23/2024 1816   BILIRUBINUR NEGATIVE 06/23/2024 1816   KETONESUR NEGATIVE 06/23/2024 1816   PROTEINUR NEGATIVE 06/23/2024 1816   UROBILINOGEN 1.0 10/01/2015 1225   NITRITE NEGATIVE 06/23/2024 1816   LEUKOCYTESUR NEGATIVE 06/23/2024 1816     Drugs of Abuse     Component Value Date/Time   LABOPIA NONE DETECTED 05/24/2022 0927   COCAINSCRNUR NONE DETECTED 05/24/2022 0927   LABBENZ NONE DETECTED 05/24/2022 0927   AMPHETMU NONE DETECTED 05/24/2022 0927   THCU NONE DETECTED 05/24/2022 0927   LABBARB NONE DETECTED 05/24/2022 9072      Radiological Exams on Admission: CT Angio Chest PE W and/or Wo Contrast Result Date: 06/24/2024 CLINICAL DATA:  Chest pain. EXAM: CT ANGIOGRAPHY CHEST WITH CONTRAST TECHNIQUE: Multidetector CT imaging of the chest was performed using the standard protocol during bolus administration of intravenous contrast.  Multiplanar CT image reconstructions and MIPs were obtained to evaluate the vascular anatomy. RADIATION DOSE REDUCTION: This exam was performed according to the departmental dose-optimization program which includes automated exposure control, adjustment of the mA and/or kV according to patient size and/or use of iterative reconstruction technique. CONTRAST:  60mL OMNIPAQUE  IOHEXOL  350 MG/ML SOLN COMPARISON:  None Available. FINDINGS: Cardiovascular: Satisfactory opacification of the pulmonary arteries to the segmental level. No evidence of pulmonary embolism. Normal heart size. No pericardial effusion. Mediastinum/Nodes: No enlarged mediastinal, hilar, or axillary lymph nodes. Thyroid gland, trachea, and esophagus demonstrate no significant findings. Lungs/Pleura: No pneumothorax or pleural effusion is noted. Mild emphysematous disease is noted in the upper lobes. Minimal bilateral posterior basilar subsegmental atelectasis is noted. Upper Abdomen: No acute abnormality. Musculoskeletal: No chest wall abnormality. No acute or significant osseous findings. Review of the MIP images confirms the above findings. IMPRESSION: No definite evidence of pulmonary embolism. Aortic Atherosclerosis (ICD10-I70.0) and Emphysema (ICD10-J43.9). Electronically Signed   By: Lynwood Landy Raddle M.D.   On: 06/24/2024 09:57   DG Chest 2 View Result Date: 06/23/2024 CLINICAL DATA:  Shortness of breath EXAM: CHEST - 2 VIEW COMPARISON:  Chest x-ray 11/01/2023.  CT of the chest 06/13/2023 FINDINGS: The heart size and mediastinal contours are within normal limits. Nodular density projects over the left costophrenic angle similar to the prior study. This may represent a nipple shadow. The lungs are otherwise clear there is no pleural effusion or pneumothorax. The lungs are hyperinflated, unchanged. The visualized skeletal structures are unremarkable. IMPRESSION: 1. No active cardiopulmonary disease. 2. Hyperinflated lungs, unchanged. 3. Nodular  density projects over the left costophrenic angle similar to the prior study. This may represent a nipple shadow. Repeat chest x-ray with nipple markers is recommended. Electronically Signed   By: Greig Pique M.D.   On: 06/23/2024 16:43   CT ABDOMEN PELVIS W CONTRAST Result Date: 06/23/2024 CLINICAL DATA:  Bowel obstruction suspected. Shortness of breath. Failure to thrive. Weight loss. EXAM: CT ABDOMEN AND PELVIS WITH CONTRAST TECHNIQUE: Multidetector CT imaging of the abdomen and pelvis was performed using the standard protocol following bolus administration of intravenous contrast. RADIATION DOSE REDUCTION: This exam was performed according to the departmental dose-optimization program which includes automated exposure control, adjustment of the mA and/or kV according to patient size and/or use of iterative reconstruction technique. CONTRAST:  100mL OMNIPAQUE  IOHEXOL  300 MG/ML  SOLN COMPARISON:  CT scan abdomen and pelvis from 02/13/2022. FINDINGS: Lower chest:  There are patchy atelectatic changes in the visualized lung bases. No overt consolidation. No pleural effusion. The heart is normal in size. No pericardial effusion. Hepatobiliary: The liver is normal in size. Non-cirrhotic configuration. No suspicious mass. No intrahepatic or extrahepatic bile duct dilation. Gallbladder is surgically absent. Pancreas: Unremarkable. No pancreatic ductal dilatation or surrounding inflammatory changes. Spleen: Within normal limits. No focal lesion. Adrenals/Urinary Tract: Adrenal glands are unremarkable. No suspicious renal mass. There are several hypoattenuating structures in the left kidney with largest measuring up to 8 x 12 mm, which can be characterized as a simple cyst. There are at least 2, 1-1.5 mm nonobstructing calculi in the left kidney and a single 1-1.5 mm nonobstructing calculus in the right kidney lower pole. No hydroureteronephrosis or ureterolithiasis on either side. Urinary bladder is under distended,  precluding optimal assessment. However, no large mass or stones identified. No perivesical fat stranding. Stomach/Bowel: No disproportionate dilation of the small or large bowel loops. No evidence of abnormal bowel wall thickening or inflammatory changes. The appendix was not visualized; however there is no acute inflammatory process in the right lower quadrant. Vascular/Lymphatic: No ascites or pneumoperitoneum. No abdominal or pelvic lymphadenopathy, by size criteria. No aneurysmal dilation of the major abdominal arteries. There are moderate peripheral atherosclerotic vascular calcifications of the aorta and its major branches. Reproductive: Not well evaluated on this CT scan exam. There is probable retroverted uterus noted. No large adnexal mass seen. Other: The visualized soft tissues and abdominal wall are unremarkable. Musculoskeletal: No suspicious osseous lesions. There are mild multilevel degenerative changes in the visualized spine. IMPRESSION: 1. No acute inflammatory process identified within the abdomen or pelvis. No bowel obstruction. 2. Multiple other nonacute observations, as described above. Aortic Atherosclerosis (ICD10-I70.0). Electronically Signed   By: Ree Molt M.D.   On: 06/23/2024 16:39     Signed, Chapman Rota, MD [facility] 06/24/2024

## 2024-06-24 NOTE — Plan of Care (Addendum)
 Patient is alert and oriented X4. Patient admitted to 1616, lunch ordered, diet advanced, oxycodone  administered for 8/10 pain. Daughter at the bedside and able to assist in admission questions. Patient's PIV intact and VSS. Will continue to monitor.  Problem: Education: Goal: Knowledge of General Education information will improve Description: Including pain rating scale, medication(s)/side effects and non-pharmacologic comfort measures Outcome: Progressing   Problem: Health Behavior/Discharge Planning: Goal: Ability to manage health-related needs will improve Outcome: Progressing   Problem: Clinical Measurements: Goal: Ability to maintain clinical measurements within normal limits will improve Outcome: Progressing Goal: Will remain free from infection Outcome: Progressing Goal: Diagnostic test results will improve Outcome: Progressing Goal: Respiratory complications will improve Outcome: Progressing Goal: Cardiovascular complication will be avoided Outcome: Progressing   Problem: Activity: Goal: Risk for activity intolerance will decrease Outcome: Progressing   Problem: Nutrition: Goal: Adequate nutrition will be maintained Outcome: Progressing   Problem: Coping: Goal: Level of anxiety will decrease Outcome: Progressing   Problem: Elimination: Goal: Will not experience complications related to bowel motility Outcome: Progressing Goal: Will not experience complications related to urinary retention Outcome: Progressing   Problem: Pain Managment: Goal: General experience of comfort will improve and/or be controlled Outcome: Progressing   Problem: Safety: Goal: Ability to remain free from injury will improve Outcome: Progressing   Problem: Skin Integrity: Goal: Risk for impaired skin integrity will decrease Outcome: Progressing

## 2024-06-24 NOTE — Progress Notes (Signed)
 OT Cancellation Note  Patient Details Name: Michele Lucas MRN: [id number] DOB: 1966-08-03   Cancelled Treatment:    Reason Eval/Treat Not Completed: Medical issues which prohibited therapy  OT attempted visit. MD with patient and requesting therapy re-check later day following additional medical w/u. Will follow up as schedule and medical stability allows.   Sevrin Sally OT/L Acute Rehabilitation Department  [phone]    06/24/2024, 8:25 AM

## 2024-06-24 NOTE — Progress Notes (Signed)
  Echocardiogram 2D Echocardiogram has been performed.  Michele Lucas 06/24/2024, 4:11 PM

## 2024-06-24 NOTE — ED Provider Notes (Signed)
 Emergency Medicine Observation Re-evaluation Note  Michele Lucas is a 58 y.o. female, seen on rounds today.  Pt initially presented to the ED for complaints of No chief complaint on file. Currently, the patient is awaiting placement.  Presented with concern for abdominal pain, failure to thrive.  CT abdomen pelvis without acute abnormalities and no significant abnormalities on labs.  Reports that she has ongoing chest pain and shortness of breath.  She was previously on home oxygen, however had a 6-minute walk test in May 2024 at which time she was found to not qualify for oxygen at night when sleeping or with ambulation.  They placed an order to discontinue her home oxygen.  Per nursing staff here, she was placed on 3 L of oxygen for comfort.    She reports she has had right-sided chest pain for the past 2 months.  Physical Exam  BP 126/78   Pulse 85   Temp 98.3 F (36.8 C) (Oral)   Resp 20   Ht 5' 2 (1.575 m)   Wt 44.1 kg   SpO2 100%   BMI 17.78 kg/m  Physical Exam General: NAD Cardiac: RR Lungs: even unlabored Psych: NA  ED Course / MDM  EKG:EKG Interpretation Date/Time:  Wednesday June 23 2024 15:30:13 EDT Ventricular Rate:  90 PR Interval:  121 QRS Duration:  69 QT Interval:  363 QTC Calculation: 445 R Axis:   77  Text Interpretation: Sinus rhythm Right atrial enlargement Consider left ventricular hypertrophy Confirmed by Bari Pfeiffer ([id number]) on 06/24/2024 4:36:24 AM  I have reviewed the labs performed to date as well as medications administered while in observation.  Recent changes in the last 24 hours include arrival to ED.  Plan   Yesterday, she had evaluation with CT abdomen pelvis and a chest x-ray.  Her EKG that was completed this morning shows no acute abnormalities, no signs of arrhythmia or acute ST changes.  Will evaluate with troponin, and given dyspnea, will place CT PE study.  Observed off of oxygen and saturations ok on room air, however with  ambulation down to 94%.    Troponin was evaluated and normal.  CT PE study was normal with emphysematous changes.  With ambulation she developed worsening chest pain, was only able to ambulate a short period time and had a desaturation to 89%.  Given this, I feel admission for chest pain observation is appropriate.  Does have wheezing, consider COPD exacerbation, given solumedrol and nebs.      [name], MD 06/24/24 2202

## 2024-06-25 ENCOUNTER — Inpatient Hospital Stay (HOSPITAL_COMMUNITY)

## 2024-06-25 DIAGNOSIS — Z91014 Allergy to mammalian meats: Secondary | ICD-10-CM | POA: Diagnosis not present

## 2024-06-25 DIAGNOSIS — Z01818 Encounter for other preprocedural examination: Secondary | ICD-10-CM | POA: Diagnosis not present

## 2024-06-25 DIAGNOSIS — R0609 Other forms of dyspnea: Secondary | ICD-10-CM | POA: Diagnosis present

## 2024-06-25 DIAGNOSIS — I340 Nonrheumatic mitral (valve) insufficiency: Secondary | ICD-10-CM | POA: Diagnosis not present

## 2024-06-25 DIAGNOSIS — Q2381 Bicuspid aortic valve: Secondary | ICD-10-CM | POA: Diagnosis not present

## 2024-06-25 DIAGNOSIS — Z681 Body mass index (BMI) 19 or less, adult: Secondary | ICD-10-CM | POA: Diagnosis not present

## 2024-06-25 DIAGNOSIS — J90 Pleural effusion, not elsewhere classified: Secondary | ICD-10-CM | POA: Diagnosis present

## 2024-06-25 DIAGNOSIS — D7589 Other specified diseases of blood and blood-forming organs: Secondary | ICD-10-CM | POA: Diagnosis present

## 2024-06-25 DIAGNOSIS — F419 Anxiety disorder, unspecified: Secondary | ICD-10-CM | POA: Diagnosis present

## 2024-06-25 DIAGNOSIS — I350 Nonrheumatic aortic (valve) stenosis: Secondary | ICD-10-CM

## 2024-06-25 DIAGNOSIS — F4310 Post-traumatic stress disorder, unspecified: Secondary | ICD-10-CM | POA: Diagnosis present

## 2024-06-25 DIAGNOSIS — Z87891 Personal history of nicotine dependence: Secondary | ICD-10-CM | POA: Diagnosis not present

## 2024-06-25 DIAGNOSIS — I352 Nonrheumatic aortic (valve) stenosis with insufficiency: Secondary | ICD-10-CM | POA: Diagnosis present

## 2024-06-25 DIAGNOSIS — K222 Esophageal obstruction: Secondary | ICD-10-CM | POA: Diagnosis present

## 2024-06-25 DIAGNOSIS — E43 Unspecified severe protein-calorie malnutrition: Secondary | ICD-10-CM | POA: Diagnosis present

## 2024-06-25 DIAGNOSIS — I351 Nonrheumatic aortic (valve) insufficiency: Secondary | ICD-10-CM

## 2024-06-25 DIAGNOSIS — M797 Fibromyalgia: Secondary | ICD-10-CM | POA: Diagnosis present

## 2024-06-25 DIAGNOSIS — Z79899 Other long term (current) drug therapy: Secondary | ICD-10-CM | POA: Diagnosis not present

## 2024-06-25 DIAGNOSIS — Z888 Allergy status to other drugs, medicaments and biological substances status: Secondary | ICD-10-CM | POA: Diagnosis not present

## 2024-06-25 DIAGNOSIS — I7300 Raynaud's syndrome without gangrene: Secondary | ICD-10-CM | POA: Diagnosis present

## 2024-06-25 DIAGNOSIS — F331 Major depressive disorder, recurrent, moderate: Secondary | ICD-10-CM | POA: Diagnosis present

## 2024-06-25 DIAGNOSIS — Z880 Allergy status to penicillin: Secondary | ICD-10-CM | POA: Diagnosis not present

## 2024-06-25 DIAGNOSIS — J439 Emphysema, unspecified: Secondary | ICD-10-CM | POA: Diagnosis present

## 2024-06-25 DIAGNOSIS — J449 Chronic obstructive pulmonary disease, unspecified: Secondary | ICD-10-CM | POA: Diagnosis not present

## 2024-06-25 DIAGNOSIS — F17210 Nicotine dependence, cigarettes, uncomplicated: Secondary | ICD-10-CM | POA: Diagnosis present

## 2024-06-25 DIAGNOSIS — R079 Chest pain, unspecified: Secondary | ICD-10-CM | POA: Diagnosis not present

## 2024-06-25 DIAGNOSIS — R627 Adult failure to thrive: Secondary | ICD-10-CM

## 2024-06-25 DIAGNOSIS — Z634 Disappearance and death of family member: Secondary | ICD-10-CM | POA: Diagnosis not present

## 2024-06-25 DIAGNOSIS — K2970 Gastritis, unspecified, without bleeding: Secondary | ICD-10-CM | POA: Diagnosis present

## 2024-06-25 DIAGNOSIS — G8929 Other chronic pain: Secondary | ICD-10-CM | POA: Diagnosis present

## 2024-06-25 DIAGNOSIS — M549 Dorsalgia, unspecified: Secondary | ICD-10-CM | POA: Diagnosis present

## 2024-06-25 DIAGNOSIS — F411 Generalized anxiety disorder: Secondary | ICD-10-CM | POA: Diagnosis not present

## 2024-06-25 DIAGNOSIS — Z886 Allergy status to analgesic agent status: Secondary | ICD-10-CM | POA: Diagnosis not present

## 2024-06-25 LAB — BASIC METABOLIC PANEL WITH GFR
Anion gap: 6 (ref 5–15)
BUN: 17 mg/dL (ref 6–20)
CO2: 28 mmol/L (ref 22–32)
Calcium: 9.3 mg/dL (ref 8.9–10.3)
Chloride: 105 mmol/L (ref 98–111)
Creatinine, Ser: 0.37 mg/dL — ABNORMAL LOW (ref 0.44–1.00)
GFR, Estimated: >60 mL/min (ref >60)
Glucose, Bld: 87 mg/dL (ref 70–99)
Potassium: 4.1 mmol/L (ref 3.5–5.1)
Sodium: 139 mmol/L (ref 135–145)

## 2024-06-25 LAB — CBC
HCT: 40.3 % (ref 36.0–46.0)
Hemoglobin: 12.4 g/dL (ref 12.0–15.0)
MCH: 32.3 pg (ref 26.0–34.0)
MCHC: 30.8 g/dL (ref 30.0–36.0)
MCV: 104.9 fL — ABNORMAL HIGH (ref 80.0–100.0)
Platelets: 106 K/uL — ABNORMAL LOW (ref 150–400)
RBC: 3.84 MIL/uL — ABNORMAL LOW (ref 3.87–5.11)
RDW: 12.3 % (ref 11.5–15.5)
WBC: 9.4 K/uL (ref 4.0–10.5)
nRBC: 0 % (ref 0.0–0.2)

## 2024-06-25 LAB — HIV ANTIBODY (ROUTINE TESTING W REFLEX): HIV Screen 4th Generation wRfx: NONREACTIVE

## 2024-06-25 MED ORDER — ENSURE PLUS HIGH PROTEIN PO LIQD: 237.0000 mL | Freq: Three times a day (TID) | ORAL | Status: DC

## 2024-06-25 MED ORDER — KETOROLAC TROMETHAMINE 15 MG/ML IJ SOLN
15.0000 mg | Freq: Once | INTRAMUSCULAR | Status: AC
  Administered 2024-06-25: 15 mg via INTRAVENOUS
  Filled 2024-06-25: qty 1

## 2024-06-25 MED ORDER — OXYCODONE HCL 5 MG PO TABS: 5.0000 mg | ORAL_TABLET | Freq: Once | ORAL | Status: DC

## 2024-06-25 MED ORDER — UMECLIDINIUM BROMIDE 62.5 MCG/ACT IN AEPB
1.0000 | INHALATION_SPRAY | Freq: Every day | RESPIRATORY_TRACT | Status: DC
  Administered 2024-06-25 – 2024-06-27 (×3): 1 via RESPIRATORY_TRACT
  Filled 2024-06-25: qty 7

## 2024-06-25 MED ORDER — ADULT MULTIVITAMIN W/MINERALS CH
1.0000 | ORAL_TABLET | Freq: Every day | ORAL | Status: DC
  Administered 2024-06-27: 1 via ORAL
  Filled 2024-06-25: qty 1

## 2024-06-25 MED ORDER — TIOTROPIUM BROMIDE MONOHYDRATE 1.25 MCG/ACT IN AERS: 2.0000 | INHALATION_SPRAY | Freq: Every morning | RESPIRATORY_TRACT | Status: DC

## 2024-06-25 MED ORDER — BOOST PLUS PO LIQD
237.0000 mL | Freq: Three times a day (TID) | ORAL | Status: DC
  Administered 2024-06-25 – 2024-06-27 (×4): 237 mL via ORAL
  Filled 2024-06-25 (×7): qty 237

## 2024-06-25 MED ORDER — ALBUTEROL SULFATE (2.5 MG/3ML) 0.083% IN NEBU
INHALATION_SOLUTION | RESPIRATORY_TRACT | Status: AC
  Filled 2024-06-25: qty 3

## 2024-06-25 MED ORDER — FUROSEMIDE 20 MG PO TABS
20.0000 mg | ORAL_TABLET | Freq: Once | ORAL | Status: AC
  Administered 2024-06-25: 20 mg via ORAL
  Filled 2024-06-25: qty 1

## 2024-06-25 MED ORDER — MIRTAZAPINE 15 MG PO TBDP
15.0000 mg | ORAL_TABLET | Freq: Every day | ORAL | Status: DC
  Administered 2024-06-25 – 2024-06-26 (×2): 15 mg via ORAL
  Filled 2024-06-25 (×2): qty 1

## 2024-06-25 MED ORDER — FLUTICASONE FUROATE-VILANTEROL 200-25 MCG/ACT IN AEPB
1.0000 | INHALATION_SPRAY | Freq: Every day | RESPIRATORY_TRACT | Status: DC
  Administered 2024-06-25 – 2024-06-27 (×3): 1 via RESPIRATORY_TRACT
  Filled 2024-06-25: qty 28

## 2024-06-25 NOTE — Progress Notes (Signed)
 Initial Nutrition Assessment  DOCUMENTATION CODES:   Severe malnutrition in context of chronic illness  INTERVENTION:  - Soft diet per MD. - Boost Plus po TID, each supplement provides 360 kcal and 14 grams of protein  - Add Multivitamin with minerals daily.  - MD planning to add Remeron  for appetite.  - Monitor weight trends.  - Monitor oral intake closely to assess for need for nutrition support (Cortrak).   NUTRITION DIAGNOSIS:   Severe Malnutrition related to chronic illness as evidenced by severe fat depletion, severe muscle depletion.  GOAL:   Patient will meet greater than or equal to 90% of their needs   MONITOR:   PO intake, Supplement acceptance, Diet advancement, Weight trends, Skin  REASON FOR ASSESSMENT:   Consult Assessment of nutrition requirement/status  ASSESSMENT:   58 y.o. female with PMH significant for COPD, emphysema, Raynaud's syndrome, fibromyalgia, chronic back pain while serving in the [employer], PTSD who presented with complaint of shortness of breath, generalized weakness, failure to thrive.   Patient reports she has had a really hard time the past 5 years, since the death of her husband.  Has had difficulty maintaining her weight and eating well due to no appetite and progressively worsening weakness over time.   Per EMR, weight has actually been stable the past year between 92-97#. It had been reported by patient on admission she has lost 88# over the years. However, patient has not weighed at more than 100# since 2021 and has not been weighed higher than 126# in the past 10 years.  Patient shares she tries to eat several meals during the day but is limited by poor appetite and early satiety. Also reports a lot of things taste like metal, which is exacerbated by metal utensils. Thankfully, she reports she does drink either Boost Plus or Glucerna (reports is supplied by the [facility]) 5-6 times a day. Does not take a multivitamin as she states she  already has a lot of pills to take each day. Suspect patient may be at risk for vitamin/mineral deficiencies. However, if she is truly consuming 5-6 Boost daily, she would be meeting RDI's.  Patient on a soft diet this AM but made NPO after RN noticed patient coughing a lot with liquids.  Esophagram ordered for this afternoon. Results showing roughly 50% distal esophageal narrowing. It is noted a 13 mm barium tablet would not pass.  Discussed concerns with patient's nutritional status with MD. At this time, MD planning to continue Soft diet and add Remeron  for appetite. Will add plastic utensils on patient trays per her report of issues with metallic taste.  Discussed could trial Cortrak to supplement intake as suspect patient may not be able get adequate intake by mouth. Per MD, holding off at this time. Will need for monitor oral intake closely.   Medications reviewed and include: -  Labs reviewed:  - Vitamin B12: WNL Folate: WNL   NUTRITION - FOCUSED PHYSICAL EXAM:  Flowsheet Row Most Recent Value  Orbital Region Moderate depletion  Upper Arm Region Severe depletion  Thoracic and Lumbar Region Severe depletion  Buccal Region Mild depletion  Temple Region Moderate depletion  Clavicle Bone Region Severe depletion  Clavicle and Acromion Bone Region Severe depletion  Scapular Bone Region Unable to assess  Dorsal Hand Moderate depletion  Patellar Region Severe depletion  Anterior Thigh Region Severe depletion  Posterior Calf Region Moderate depletion  Edema (RD Assessment) None  Hair Reviewed  Eyes Reviewed  Mouth Reviewed  [mostly  edentulous]  Skin Reviewed  [VERY dry, flaky]  Nails Reviewed    Diet Order:   Diet Order             DIET SOFT Room service appropriate? Yes; Fluid consistency: Thin  Diet effective now                   EDUCATION NEEDS:  Education needs have been addressed  Skin:  Skin Assessment: Reviewed RN Assessment  Last BM:  PTA  Height:   Ht Readings from Last 1 Encounters:  06/23/24 5' 2 (1.575 m)   Weight:  Wt Readings from Last 1 Encounters:  06/23/24 44.1 kg   Ideal Body Weight:  50 kg  BMI:  Body mass index is 17.78 kg/m.  Estimated Nutritional Needs:  Kcal:  1750-2000 kcals Protein:  75-90 grams Fluid:  >/= 1.8L    Trude Ned RD, LDN Contact via Secure Chat.

## 2024-06-25 NOTE — Plan of Care (Signed)

## 2024-06-25 NOTE — Progress Notes (Signed)
 PROGRESS NOTE  Michele Lucas  DOB: 12/19/1965  PCP: Phyllistine Lenis, MD FMW:[id number]  DOA: 06/23/2024  LOS: 0 days  Hospital Day: 3  Brief narrative: Michele Lucas is a 58 y.o. female with PMH significant for COPD, emphysema, no longer smoking, Raynaud's syndrome, fibromyalgia, chronic back pain while serving in the [employer], PTSD. 7/16, patient was brought to the ED by family with complaint of shortness of breath, generalized weakness, failure to thrive.  At the time of my evaluation, patient was in bed.  Alert, awake, oriented x 3 and able to give details of the history.  Very sad affect and tearful at times.  Family not at bedside Patient states since her husband died 5 years ago, she has been in grief, lost significant weight and never been able to 'come back to normal'. Over the years, she has lost about 88 pounds. Lives at home alone, daughter lives nearby. Patient complains of some dull aching epigastric pain all the time but denies any nausea, vomiting, diarrhea, difficulty swallowing or painful swallowing. Patient has fibromyalgia and is on oxycodone  3-4 times a day. Lately, she has also developed dyspnea more on exertion. She states she has been asking for some help at the [facility] but not been able to get seen.  With worsening symptoms, patient was brought to the ED.  In the ED, patient was afebrile, hemodynamically stable, not requiring supplemental oxygen Labs showed WC count 6.8, hemoglobin 13.4, MCV 103, sodium 144, renal functional normal, electrolytes normal enzymes normal, troponin normal Urinalysis showed hazy yellow color urine  CT angio chest without any evidence of pulm embolism or acute cardiopulmonary abnormality.   CT abdomen pelvis did not show any acute inflammatory process identified within the abdomen or pelvis. No bowel obstruction.  Admitted to [facility] Echocardiogram showed bicuspid aortic valve, severe AR, moderate AAS, mild to moderate MR  Subjective: Patient  was seen and examined this morning. Middle-aged African-American female.  This morning she was in distress from vomiting.  She states she felt some gritty sensation in the back of her throat after nebulization.  It led her to gag and subsequently vomiting. She reports she took her meal last night without any issue. Pending psychiatry evaluation We discussed the echo findings suggesting severe valvulopathy.  Assessment/Plan: Dyspnea on exertion Severe AI, moderate AS, mild to moderate MR Presented with dyspnea on exertion, failure to thrive, physical deconditioning  Echo findings as above showing severe AI, moderate AS, moderate MR Does not look volume overloaded on exam. Cardiology consulted. Recommended TEE versus cardiac MRI Continue to monitor  ??  Left pleural effusion Echocardiogram showed large left pleural effusion. Thoracentesis ordered but on ultrasound, no fluid pocket was noted.  Midsternal chest pain Significant weight loss Severe malnutrition On admission, patient reported constant midsternal and right-sided chest pain. Troponin negative, EKG unremarkable.  Likely noncardiac etiology of chest pain. Denies any dysphagia, odynophagia. Given significant epigastric tenderness, I have ordered esophagram. Dietitian consulted.  Started on Remeron  Patient is currently on soft diet.  Generalized weakness H/o fibromyalgia, PTSD Given history of fibromyalgia and persistence of symptoms since her husband died, I would suspect severe depression as the cause. Obtain TSH, cortisol, vitamin B12 and folic acid level. Psychiatry consultation was called.  Chronic back pain As needed oxycodone   Macrocytosis Normal hemoglobin level. Normal folic acid and vitamin B12 level Recent Labs    11/01/23 1200 06/23/24 1328 06/24/24 1349 06/25/24 0953  HGB 12.8 13.4  --  12.4  MCV 102.2* 102.6*  --  104.9*  VITAMINB12  --   --  345  --   FOLATE  --   --  15.8  --    Mobility: PT  eval  Goals of care:   Code Status: Full Code    DVT prophylaxis: Allergy to pork products SCDs Start: 06/24/24 1131   Antimicrobials: None Fluid: Stop IV hydration Consultants: Psychiatry, cardiology Family Communication: None at bedside  Status: Patient Level of care:  Telemetry   Patient is from: Home Anticipated d/c to: Pending clinical course  Diet:  Diet Order             DIET SOFT Room service appropriate? Yes; Fluid consistency: Thin  Diet effective now                   Scheduled Meds:  feeding supplement  237 mL Oral TID BM   fluticasone furoate-vilanterol  1 puff Inhalation Daily   mirtazapine   15 mg Oral QHS   pantoprazole   40 mg Oral Daily   umeclidinium bromide   1 puff Inhalation Daily    PRN meds: albuterol , bisacodyl , hydrALAZINE , melatonin, methocarbamol , oxyCODONE , polyethylene glycol   Infusions:     Antimicrobials: Anti-infectives (From admission, onward)    None       Objective: Vitals:   06/25/24 0540 06/25/24 0640  BP: 126/70   Pulse: 85   Resp: 20   Temp: 98.1 F (36.7 C)   SpO2: 95% 94%    Intake/Output Summary (Last 24 hours) at 06/25/2024 1416 Last data filed at 06/24/2024 1600 Gross per 24 hour  Intake 347.02 ml  Output --  Net 347.02 ml   Filed Weights   06/23/24 1723  Weight: 44.1 kg   Weight change:  Body mass index is 17.78 kg/m.   Physical Exam: General exam: Pleasant, middle-aged African-American female.  Thin built. Skin: No rashes, lesions or ulcers. HEENT: Atraumatic, normocephalic, no obvious bleeding Lungs: Clear to auscultation bilaterally,  CVS: S1, S2, no murmur,   GI/Abd: Soft, tender epigastrium, nondistended, bowel sound present,   CNS: Alert, awake, oriented x 3 Psychiatry: Sad affect, tearful Extremities: No pedal edema, no calf tenderness.  Data Review: I have personally reviewed the laboratory data and studies available.  F/u labs ordered Unresulted Labs (From admission,  onward)     Start     Ordered   06/26/24 0500  CBC with Differential/Platelet  Tomorrow morning,   R        06/25/24 1409   06/26/24 0500  Basic metabolic panel with GFR  Tomorrow morning,   R        06/25/24 1409   06/25/24 0500  HIV Antibody (routine testing w rflx)  (HIV Antibody (Routine testing w reflex) panel)  Tomorrow morning,   R        06/24/24 1133           Signed, Chapman Rota, MD [facility] 06/25/2024

## 2024-06-25 NOTE — H&P (View-Only) (Signed)
 Referring Provider: Dr. Arlice Primary Care Physician:  Phyllistine Lenis, MD Primary Gastroenterologist:  [place]  Reason for Consultation:  Abnormal barium swallow; Weight loss  HPI: Michele Lucas is a 58 y.o. female with over 50 pound weight loss over the last 5 years that she attributes to grief from her husband passing away.  She cannot quantify how much weight she has lost in the last year. Has had 2 episodes during that time where she felt like water hung up when she tried to swallow with but denies any other trouble swallowing liquids and denies any trouble swallowing foods.  Denies nausea vomiting.  Denies heartburn.  Reports abdominal pain daily for years mainly in her epigastric region stating that she has fibromyalgia.  Denies black stools or rectal bleeding.  Barium swallow shows significant narrowing of the distal esophagus of approximately 50% and a barium tablet would not pass.  Abd/pelvis CT negative for any acute abnormalities.   Past Medical History:  Diagnosis Date   Anxiety    Chronic back pain    COPD, mild (HCC)    Emphysema lung (HCC)    Endometriosis    Fibromyalgia    Grade I diastolic dysfunction 11/26/2021   Kidney stones    Neuropathic pain    PTSD (post-traumatic stress disorder)     Past Surgical History:  Procedure Laterality Date   CESAREAN SECTION     copolscopy     TONSILLECTOMY     TUBAL LIGATION      Prior to Admission medications   Medication Sig Start Date End Date Taking? Authorizing Provider  albuterol  (PROVENTIL  HFA;VENTOLIN  HFA) 108 (90 Base) MCG/ACT inhaler Inhale 2 puffs into the lungs 5 (five) times daily.   Yes [provider]  albuterol  (PROVENTIL ) (2.5 MG/3ML) 0.083% nebulizer solution Take 3 mLs (2.5 mg total) by nebulization every 6 (six) hours as needed for wheezing or shortness of breath. 01/07/22  Yes Kehrli, Kelsey F, PA-C  benzonatate  (TESSALON ) 200 MG capsule Take 1 capsule (200 mg total) by mouth 3 (three) times daily  as needed for cough. 05/27/22  Yes Sheikh, Omair Latif, DO  fluticasone-salmeterol (WIXELA INHUB) 500-50 MCG/ACT AEPB Inhale 1 puff into the lungs in the morning and at bedtime.   Yes [provider]  hydrOXYzine  (ATARAX ) 25 MG tablet Take 25 mg by mouth in the morning, at noon, in the evening, and at bedtime.   Yes [provider]  lactose free nutrition (BOOST PLUS) LIQD Take 237 mLs by mouth 3 (three) times daily with meals. Patient taking differently: Take 237 mLs by mouth See admin instructions. Drink 237 ml's by mouth (CHOCOLATE) three times a day with meals 05/27/22  Yes Sheikh, Omair Latif, DO  loperamide  (IMODIUM ) 2 MG capsule Take 2 mg by mouth as needed for diarrhea or loose stools.   Yes [provider]  oxyCODONE  (OXY IR/ROXICODONE ) 5 MG immediate release tablet Take 1 tablet (5 mg total) by mouth 3 (three) times daily as needed for severe pain. Patient taking differently: Take 5 mg by mouth 3 (three) times daily. 06/15/23  Yes Drusilla Sabas RAMAN, MD  predniSONE  (DELTASONE ) 10 MG tablet Take 20 mg by mouth See admin instructions. Take 20 mg by mouth with food once a day as directed for COPD flares   Yes [provider]  Tiotropium Bromide  Monohydrate (SPIRIVA  RESPIMAT) 1.25 MCG/ACT AERS Inhale 2 puffs into the lungs in the morning.   Yes [provider]  azithromycin  (ZITHROMAX  Z-PAK) 250 MG tablet  2 po day one, then 1 daily x 4 days Patient not taking: Reported on 06/24/2024 11/01/23   Nivia Colon, PA-C  Multiple Vitamin (MULTIVITAMIN WITH MINERALS) TABS tablet Take 1 tablet by mouth daily. Patient not taking: Reported on 06/24/2024 05/28/22   Sherrill Cable Latif, DO  pantoprazole  (PROTONIX ) 40 MG tablet Take 1 tablet (40 mg total) by mouth daily. Patient not taking: Reported on 06/24/2024 06/15/23 06/24/24  Drusilla Sabas RAMAN, MD  predniSONE  (DELTASONE ) 20 MG tablet 3 tabs po day one, then 2 tabs daily x 4 days Patient not taking: Reported on 06/24/2024 11/01/23    Nivia Colon, PA-C    Scheduled Meds:  fluticasone furoate-vilanterol  1 puff Inhalation Daily   lactose free nutrition  237 mL Oral TID BM   mirtazapine   15 mg Oral QHS   [START ON 06/26/2024] multivitamin with minerals  1 tablet Oral Daily   pantoprazole   40 mg Oral Daily   umeclidinium bromide   1 puff Inhalation Daily   Continuous Infusions: PRN Meds:.albuterol , bisacodyl , hydrALAZINE , melatonin, methocarbamol , oxyCODONE , polyethylene glycol  Allergies as of 06/23/2024 - Reviewed 06/23/2024  Allergen Reaction Noted   Guaifenesin & derivatives Shortness Of Breath 12/05/2012   Cymbalta [duloxetine hcl] Other (See Comments) 01/07/2022   Ibuprofen   05/22/2016   Neurontin [gabapentin] Other (See Comments) 04/03/2018   Pork-derived products  05/26/2022   Prozac [fluoxetine hcl] Other (See Comments) 11/26/2021   Tylenol  [acetaminophen ]  05/22/2016   Ampicillin Hives and Rash 03/15/2012   Clindamycin /lincomycin Swelling and Rash 12/05/2012   Penicillins Hives and Rash 11/26/2021    Family History  Problem Relation Age of Onset   Diabetes type II Other    Stroke Neg Hx     Social History   Socioeconomic History   Marital status: Married    Spouse name: Not on file   Number of children: Not on file   Years of education: Not on file   Highest education level: Not on file  Occupational History   Not on file  Tobacco Use   Smoking status: Former    Current packs/day: 0.00    Types: Cigarettes    Quit date: 11/25/2021    Years since quitting: 2.5   Smokeless tobacco: Current   Tobacco comments:    Previously documented in a pack per day-she now reports 4 to 5 cigarettes/day  Vaping Use   Vaping status: Never Used  Substance and Sexual Activity   Alcohol use: No   Drug use: No   Sexual activity: Yes    Birth control/protection: Surgical  Other Topics Concern   Not on file  Social History Narrative   Not on file   Social Drivers of Health   Financial Resource  Strain: Not on file  Food Insecurity: No Food Insecurity (06/24/2024)   Hunger Vital Sign    Worried About Running Out of Food in the Last Year: Never true    Ran Out of Food in the Last Year: Never true  Transportation Needs: No Transportation Needs (06/24/2024)   PRAPARE - Administrator, Civil Service (Medical): No    Lack of Transportation (Non-Medical): No  Physical Activity: Not on file  Stress: Not on file  Social Connections: Not on file  Intimate Partner Violence: Not At Risk (06/24/2024)   Humiliation, Afraid, Rape, and Kick questionnaire    Fear of Current or Ex-Partner: No    Emotionally Abused: No    Physically Abused: No    Sexually Abused: No  Review of Systems: All negative except as stated above in HPI.  Physical Exam: Vital signs: Vitals:   06/25/24 0540 06/25/24 0640  BP: 126/70   Pulse: 85   Resp: 20   Temp: 98.1 F (36.7 C)   SpO2: 95% 94%     General: Thin, lethargic, no acute distress, tearful  Head: normocephalic, atraumatic Eyes: anicteric sclera ENT: oropharynx clear Neck: supple, nontender Lungs:  Clear throughout to auscultation.   No wheezes, crackles, or rhonchi. No acute distress. Heart:  Regular rate and rhythm; no murmurs, clicks, rubs,  or gallops. Abdomen: Epigastric tenderness with guarding, soft, nondistended, positive bowel sounds Rectal:  Deferred Ext: no edema  GI:  Lab Results: Recent Labs    06/23/24 1328 06/25/24 0953  WBC 6.8 9.4  HGB 13.4 12.4  HCT 43.1 40.3  PLT 127* 106*   BMET Recent Labs    06/23/24 1328 06/25/24 0953  NA 144 139  K 3.9 4.1  CL 111 105  CO2 25 28  GLUCOSE 117* 87  BUN 13 17  CREATININE 0.50 0.37*  CALCIUM 9.8 9.3   LFT Recent Labs    06/23/24 1328  PROT 6.7  ALBUMIN 3.8  AST 18  ALT 11  ALKPHOS 45  BILITOT 1.0   PT/INR No results for input(s): LABPROT, INR in the last 72 hours.   Studies/Results: DG ESOPHAGUS W SINGLE CM (SOL OR THIN BA) Result Date:  06/25/2024 CLINICAL DATA:  58 year old female with a history of Raynaud's syndrome, fibromyalgia, failure to thrive and significant weight loss over the past several years. Patient reports dull epigastric aching for the past week but denies any dysphagia, nausea, vomiting, or reflux. EXAM: ESOPHAGUS/BARIUM SWALLOW/TABLET STUDY TECHNIQUE: Single contrast examination was performed using thin liquid barium. This exam was performed by [facility] PA-C, and was supervised and interpreted by Dr. Newell Eke. FLUOROSCOPY: Radiation Exposure Index (as provided by the fluoroscopic device): 4.6 mGy Kerma COMPARISON:  CT chest 06/24/2024. FINDINGS: Swallowing: Not formally assessed. Pharynx: Not formally assessed. Esophagus: Approximate 50% narrowing of the distal esophagus, through which a 13 mm barium tablet would not pass. Esophageal motility: Not assessed due to 45 degree recumbent position. Hiatal Hernia: None visualized. Gastroesophageal reflux: None visualized with coughing or Valsalva. Ingested 13mm barium tablet: Tablet became stuck in the distal esophagus and would not pass with additional water intake or thin barium. Other: Procedure performed at ~ 45 degree table tilt as patient is unable to lie flat. IMPRESSION: 1. Roughly 50% distal esophageal narrowing, through which a 13 mm barium tablet would not pass. 2. Otherwise limited exam due to patient condition. Electronically Signed   By: Newell Eke M.D.   On: 06/25/2024 14:43       Impression/Plan: 59 year old woman with severe weight loss that she attributes to grieving from her husband passing away 5 years ago.  Other than 2 isolated incidents where she felt like water would not go down she denies any other episodes of trouble swallowing liquids and denies any trouble swallowing foods.  Barium swallow shows significant narrowing of the distal esophagus and failure of a barium tablet passing.  Despite her lack of solid food dysphagia she needs an  upper endoscopy at some point this hospitalization.  Due to her severe valvular issues on echocardiogram we will await cardiology's opinion regarding the feasibility of doing an upper endoscopy in the near future.  Clear liquid diet.  If upper endoscopy not planned this weekend then can advance diet but Dr.Vreeland will  f/u tomorrow.    LOS: 0 days   Jerrell JAYSON Sol  06/25/2024, 5:21 PM  Questions please call [phone]

## 2024-06-25 NOTE — Consult Note (Addendum)
 Referring Provider: Dr. Arlice Primary Care Physician:  Phyllistine Lenis, MD Primary Gastroenterologist:  [place]  Reason for Consultation:  Abnormal barium swallow; Weight loss  HPI: Michele Lucas is a 58 y.o. female with over 50 pound weight loss over the last 5 years that she attributes to grief from her husband passing away.  She cannot quantify how much weight she has lost in the last year. Has had 2 episodes during that time where she felt like water hung up when she tried to swallow with but denies any other trouble swallowing liquids and denies any trouble swallowing foods.  Denies nausea vomiting.  Denies heartburn.  Reports abdominal pain daily for years mainly in her epigastric region stating that she has fibromyalgia.  Denies black stools or rectal bleeding.  Barium swallow shows significant narrowing of the distal esophagus of approximately 50% and a barium tablet would not pass.  Abd/pelvis CT negative for any acute abnormalities.   Past Medical History:  Diagnosis Date   Anxiety    Chronic back pain    COPD, mild (HCC)    Emphysema lung (HCC)    Endometriosis    Fibromyalgia    Grade I diastolic dysfunction 11/26/2021   Kidney stones    Neuropathic pain    PTSD (post-traumatic stress disorder)     Past Surgical History:  Procedure Laterality Date   CESAREAN SECTION     copolscopy     TONSILLECTOMY     TUBAL LIGATION      Prior to Admission medications   Medication Sig Start Date End Date Taking? Authorizing Provider  albuterol  (PROVENTIL  HFA;VENTOLIN  HFA) 108 (90 Base) MCG/ACT inhaler Inhale 2 puffs into the lungs 5 (five) times daily.   Yes [provider]  albuterol  (PROVENTIL ) (2.5 MG/3ML) 0.083% nebulizer solution Take 3 mLs (2.5 mg total) by nebulization every 6 (six) hours as needed for wheezing or shortness of breath. 01/07/22  Yes Kehrli, Kelsey F, PA-C  benzonatate  (TESSALON ) 200 MG capsule Take 1 capsule (200 mg total) by mouth 3 (three) times daily  as needed for cough. 05/27/22  Yes Sheikh, Omair Latif, DO  fluticasone-salmeterol (WIXELA INHUB) 500-50 MCG/ACT AEPB Inhale 1 puff into the lungs in the morning and at bedtime.   Yes [provider]  hydrOXYzine  (ATARAX ) 25 MG tablet Take 25 mg by mouth in the morning, at noon, in the evening, and at bedtime.   Yes [provider]  lactose free nutrition (BOOST PLUS) LIQD Take 237 mLs by mouth 3 (three) times daily with meals. Patient taking differently: Take 237 mLs by mouth See admin instructions. Drink 237 ml's by mouth (CHOCOLATE) three times a day with meals 05/27/22  Yes Sheikh, Omair Latif, DO  loperamide  (IMODIUM ) 2 MG capsule Take 2 mg by mouth as needed for diarrhea or loose stools.   Yes [provider]  oxyCODONE  (OXY IR/ROXICODONE ) 5 MG immediate release tablet Take 1 tablet (5 mg total) by mouth 3 (three) times daily as needed for severe pain. Patient taking differently: Take 5 mg by mouth 3 (three) times daily. 06/15/23  Yes Drusilla Sabas RAMAN, MD  predniSONE  (DELTASONE ) 10 MG tablet Take 20 mg by mouth See admin instructions. Take 20 mg by mouth with food once a day as directed for COPD flares   Yes [provider]  Tiotropium Bromide  Monohydrate (SPIRIVA  RESPIMAT) 1.25 MCG/ACT AERS Inhale 2 puffs into the lungs in the morning.   Yes [provider]  azithromycin  (ZITHROMAX  Z-PAK) 250 MG tablet  2 po day one, then 1 daily x 4 days Patient not taking: Reported on 06/24/2024 11/01/23   Nivia Colon, PA-C  Multiple Vitamin (MULTIVITAMIN WITH MINERALS) TABS tablet Take 1 tablet by mouth daily. Patient not taking: Reported on 06/24/2024 05/28/22   Sherrill Cable Latif, DO  pantoprazole  (PROTONIX ) 40 MG tablet Take 1 tablet (40 mg total) by mouth daily. Patient not taking: Reported on 06/24/2024 06/15/23 06/24/24  Drusilla Sabas RAMAN, MD  predniSONE  (DELTASONE ) 20 MG tablet 3 tabs po day one, then 2 tabs daily x 4 days Patient not taking: Reported on 06/24/2024 11/01/23    Nivia Colon, PA-C    Scheduled Meds:  fluticasone furoate-vilanterol  1 puff Inhalation Daily   lactose free nutrition  237 mL Oral TID BM   mirtazapine   15 mg Oral QHS   [START ON 06/26/2024] multivitamin with minerals  1 tablet Oral Daily   pantoprazole   40 mg Oral Daily   umeclidinium bromide   1 puff Inhalation Daily   Continuous Infusions: PRN Meds:.albuterol , bisacodyl , hydrALAZINE , melatonin, methocarbamol , oxyCODONE , polyethylene glycol  Allergies as of 06/23/2024 - Reviewed 06/23/2024  Allergen Reaction Noted   Guaifenesin & derivatives Shortness Of Breath 12/05/2012   Cymbalta [duloxetine hcl] Other (See Comments) 01/07/2022   Ibuprofen   05/22/2016   Neurontin [gabapentin] Other (See Comments) 04/03/2018   Pork-derived products  05/26/2022   Prozac [fluoxetine hcl] Other (See Comments) 11/26/2021   Tylenol  [acetaminophen ]  05/22/2016   Ampicillin Hives and Rash 03/15/2012   Clindamycin /lincomycin Swelling and Rash 12/05/2012   Penicillins Hives and Rash 11/26/2021    Family History  Problem Relation Age of Onset   Diabetes type II Other    Stroke Neg Hx     Social History   Socioeconomic History   Marital status: Married    Spouse name: Not on file   Number of children: Not on file   Years of education: Not on file   Highest education level: Not on file  Occupational History   Not on file  Tobacco Use   Smoking status: Former    Current packs/day: 0.00    Types: Cigarettes    Quit date: 11/25/2021    Years since quitting: 2.5   Smokeless tobacco: Current   Tobacco comments:    Previously documented in a pack per day-she now reports 4 to 5 cigarettes/day  Vaping Use   Vaping status: Never Used  Substance and Sexual Activity   Alcohol use: No   Drug use: No   Sexual activity: Yes    Birth control/protection: Surgical  Other Topics Concern   Not on file  Social History Narrative   Not on file   Social Drivers of Health   Financial Resource  Strain: Not on file  Food Insecurity: No Food Insecurity (06/24/2024)   Hunger Vital Sign    Worried About Running Out of Food in the Last Year: Never true    Ran Out of Food in the Last Year: Never true  Transportation Needs: No Transportation Needs (06/24/2024)   PRAPARE - Administrator, Civil Service (Medical): No    Lack of Transportation (Non-Medical): No  Physical Activity: Not on file  Stress: Not on file  Social Connections: Not on file  Intimate Partner Violence: Not At Risk (06/24/2024)   Humiliation, Afraid, Rape, and Kick questionnaire    Fear of Current or Ex-Partner: No    Emotionally Abused: No    Physically Abused: No    Sexually Abused: No  Review of Systems: All negative except as stated above in HPI.  Physical Exam: Vital signs: Vitals:   06/25/24 0540 06/25/24 0640  BP: 126/70   Pulse: 85   Resp: 20   Temp: 98.1 F (36.7 C)   SpO2: 95% 94%     General: Thin, lethargic, no acute distress, tearful  Head: normocephalic, atraumatic Eyes: anicteric sclera ENT: oropharynx clear Neck: supple, nontender Lungs:  Clear throughout to auscultation.   No wheezes, crackles, or rhonchi. No acute distress. Heart:  Regular rate and rhythm; no murmurs, clicks, rubs,  or gallops. Abdomen: Epigastric tenderness with guarding, soft, nondistended, positive bowel sounds Rectal:  Deferred Ext: no edema  GI:  Lab Results: Recent Labs    06/23/24 1328 06/25/24 0953  WBC 6.8 9.4  HGB 13.4 12.4  HCT 43.1 40.3  PLT 127* 106*   BMET Recent Labs    06/23/24 1328 06/25/24 0953  NA 144 139  K 3.9 4.1  CL 111 105  CO2 25 28  GLUCOSE 117* 87  BUN 13 17  CREATININE 0.50 0.37*  CALCIUM 9.8 9.3   LFT Recent Labs    06/23/24 1328  PROT 6.7  ALBUMIN 3.8  AST 18  ALT 11  ALKPHOS 45  BILITOT 1.0   PT/INR No results for input(s): LABPROT, INR in the last 72 hours.   Studies/Results: DG ESOPHAGUS W SINGLE CM (SOL OR THIN BA) Result Date:  06/25/2024 CLINICAL DATA:  58 year old female with a history of Raynaud's syndrome, fibromyalgia, failure to thrive and significant weight loss over the past several years. Patient reports dull epigastric aching for the past week but denies any dysphagia, nausea, vomiting, or reflux. EXAM: ESOPHAGUS/BARIUM SWALLOW/TABLET STUDY TECHNIQUE: Single contrast examination was performed using thin liquid barium. This exam was performed by [facility] PA-C, and was supervised and interpreted by Dr. Newell Eke. FLUOROSCOPY: Radiation Exposure Index (as provided by the fluoroscopic device): 4.6 mGy Kerma COMPARISON:  CT chest 06/24/2024. FINDINGS: Swallowing: Not formally assessed. Pharynx: Not formally assessed. Esophagus: Approximate 50% narrowing of the distal esophagus, through which a 13 mm barium tablet would not pass. Esophageal motility: Not assessed due to 45 degree recumbent position. Hiatal Hernia: None visualized. Gastroesophageal reflux: None visualized with coughing or Valsalva. Ingested 13mm barium tablet: Tablet became stuck in the distal esophagus and would not pass with additional water intake or thin barium. Other: Procedure performed at ~ 45 degree table tilt as patient is unable to lie flat. IMPRESSION: 1. Roughly 50% distal esophageal narrowing, through which a 13 mm barium tablet would not pass. 2. Otherwise limited exam due to patient condition. Electronically Signed   By: Newell Eke M.D.   On: 06/25/2024 14:43       Impression/Plan: 59 year old woman with severe weight loss that she attributes to grieving from her husband passing away 5 years ago.  Other than 2 isolated incidents where she felt like water would not go down she denies any other episodes of trouble swallowing liquids and denies any trouble swallowing foods.  Barium swallow shows significant narrowing of the distal esophagus and failure of a barium tablet passing.  Despite her lack of solid food dysphagia she needs an  upper endoscopy at some point this hospitalization.  Due to her severe valvular issues on echocardiogram we will await cardiology's opinion regarding the feasibility of doing an upper endoscopy in the near future.  Clear liquid diet.  If upper endoscopy not planned this weekend then can advance diet but Dr.Vreeland will  f/u tomorrow.    LOS: 0 days   Jerrell JAYSON Sol  06/25/2024, 5:21 PM  Questions please call [phone]

## 2024-06-25 NOTE — Evaluation (Signed)
 Physical Therapy Evaluation Patient Details Name: Michele Lucas MRN: [id number] DOB: 01-Jul-1966 Today's Date: 06/25/2024  History of Present Illness  Michele Lucas is a 58 year old female presenting emergency department 06/23/2024 and now admitted with DOE/chest pain, generalized muscle weakness and severe malnutrition. PMH: PTSD, fibromyalgia, anxiety, chronic back pain, emphysem  Clinical Impression    Pt admitted with above diagnosis.  Pt currently with functional limitations due to the deficits listed below (see PT Problem List). Pt in bed when PT arrived. Pt stated feeling nauseated this am and unable to participate with OT. Pt performed supine to sit mod I with HOB elevated, sit to stand  from EOB to RW with S and short amb bout of 4 feet with RW and CGA. Pt encouraged to sit up in recliner for 1 hr and verbalized understanding and all needs in place.  Pt will benefit from acute skilled PT to increase their independence and safety with mobility to allow discharge.         If plan is discharge home, recommend the following: A little help with walking and/or transfers;A little help with bathing/dressing/bathroom;Assistance with cooking/housework;Assist for transportation;Help with stairs or ramp for entrance   Can travel by private vehicle        Equipment Recommendations None recommended by PT  Recommendations for Other Services       Functional Status Assessment Patient has had a recent decline in their functional status and demonstrates the ability to make significant improvements in function in a reasonable and predictable amount of time.     Precautions / Restrictions Precautions Precautions: Fall Restrictions Weight Bearing Restrictions Per Provider Order: No      Mobility  Bed Mobility Overal bed mobility: Modified Independent                  Transfers Overall transfer level: Needs assistance Equipment used: Rolling walker (2 wheels) Transfers: Sit to/from  Stand, Bed to chair/wheelchair/BSC Sit to Stand: Supervision Stand pivot transfers: Supervision         General transfer comment: min cues    Ambulation/Gait Ambulation/Gait assistance: Contact guard assist Gait Distance (Feet): 4 Feet Assistive device: Rolling walker (2 wheels) Gait Pattern/deviations: Step-through pattern, Trunk flexed Gait velocity: decreased     General Gait Details: short amb bout due to pt reporting not feeling well this am, min cues for posture and Rw managment  Stairs            Wheelchair Mobility     Tilt Bed    Modified Rankin (Stroke Patients Only)       Balance Overall balance assessment: Mild deficits observed, not formally tested                                           Pertinent Vitals/Pain Pain Assessment Pain Assessment: No/denies pain    Home Living Family/patient expects to be discharged to:: Private residence Living Arrangements: Alone Available Help at Discharge: Family Type of Home: Apartment Home Access: Level entry       Home Layout: One level Home Equipment: Agricultural consultant (2 wheels);Cane - single point;Shower seat      Prior Function Prior Level of Function : Needs assist       Physical Assist : ADLs (physical)   ADLs (physical): IADLs Mobility Comments: pt reports mod I with SPC for ambulation in home and community ADLs Comments: Daughter assists  with household management, meal prep,  IADLs     Extremity/Trunk Assessment        Lower Extremity Assessment Lower Extremity Assessment: Generalized weakness       Communication   Communication Communication: No apparent difficulties    Cognition Arousal: Alert Behavior During Therapy: WFL for tasks assessed/performed   PT - Cognitive impairments: No apparent impairments                         Following commands: Intact       Cueing       General Comments General comments (skin integrity, edema, etc.):  2 L/min supplemental O2 and 99% with reports of SOB    Exercises     Assessment/Plan    PT Assessment Patient needs continued PT services  PT Problem List Decreased strength;Decreased activity tolerance;Decreased balance;Decreased mobility;Pain;Cardiopulmonary status limiting activity       PT Treatment Interventions DME instruction;Gait training;Functional mobility training;Therapeutic activities;Therapeutic exercise;Balance training;Neuromuscular re-education;Patient/family education    PT Goals (Current goals can be found in the Care Plan section)  Acute Rehab PT Goals Patient Stated Goal: to be able to have supplemental O2 in home setting and get stronger PT Goal Formulation: With patient Time For Goal Achievement: 07/12/24 Potential to Achieve Goals: Good    Frequency Min 3X/week     Co-evaluation               AM-PAC PT 6 Clicks Mobility  Outcome Measure Help needed turning from your back to your side while in a flat bed without using bedrails?: None Help needed moving from lying on your back to sitting on the side of a flat bed without using bedrails?: None Help needed moving to and from a bed to a chair (including a wheelchair)?: A Little Help needed standing up from a chair using your arms (e.g., wheelchair or bedside chair)?: A Little Help needed to walk in hospital room?: A Little Help needed climbing 3-5 steps with a railing? : A Lot 6 Click Score: 19    End of Session Equipment Utilized During Treatment: Gait belt;Oxygen Activity Tolerance: Patient limited by fatigue Patient left: in chair;with call bell/phone within reach;with chair alarm set Nurse Communication: Mobility status PT Visit Diagnosis: Unsteadiness on feet (R26.81);Other abnormalities of gait and mobility (R26.89);Muscle weakness (generalized) (M62.81);Difficulty in walking, not elsewhere classified (R26.2)    Time: 8988-8968 PT Time Calculation (min) (ACUTE ONLY): 20 min   Charges:    PT Evaluation $PT Eval Low Complexity: 1 Low   PT General Charges $$ ACUTE PT VISIT: 1 Visit         [place], PT Acute Rehab   [place] VEAR Drone 06/25/2024, 1:23 PM

## 2024-06-25 NOTE — Plan of Care (Signed)
  Problem: Education: Goal: Knowledge of General Education information will improve Description: Including pain rating scale, medication(s)/side effects and non-pharmacologic comfort measures Outcome: Progressing   Problem: Health Behavior/Discharge Planning: Goal: Ability to manage health-related needs will improve Outcome: Progressing   Problem: Nutrition: Goal: Adequate nutrition will be maintained Outcome: Progressing   Problem: Coping: Goal: Level of anxiety will decrease Outcome: Progressing   Problem: Elimination: Goal: Will not experience complications related to bowel motility Outcome: Progressing Goal: Will not experience complications related to urinary retention Outcome: Progressing   Problem: Safety: Goal: Ability to remain free from injury will improve Outcome: Progressing   Problem: Skin Integrity: Goal: Risk for impaired skin integrity will decrease Outcome: Progressing

## 2024-06-25 NOTE — Progress Notes (Signed)
 OT Cancellation Note  Patient Details Name: Michele Lucas MRN: [id number] DOB: [birth year]   Cancelled Treatment:    Reason Eval/Treat Not Completed: Medical issues which prohibited therapy OT attempted evaluation again this am. Patient in significant pain and vomiting currently. OT care coord with nursing who reports awaiting new MD orders. Will continue to follow when able to participate.   Azzan Butler OT/L Acute Rehabilitation Department  [phone]   06/25/2024, 8:47 AM

## 2024-06-25 NOTE — Consult Note (Signed)
 [facility] Psychiatric Consult Initial  Patient Name: .Michele Lucas  MRN: [id number]  DOB: 12-Aug-1966  Consult Order details:  Orders (From admission, onward)     Start     Ordered   06/24/24 1317  IP CONSULT TO PSYCHIATRY       Ordering Provider: Arlice Reichert, MD  Provider:  (Not yet assigned)  Question Answer Comment  Location [facility]   Reason for Consult? severe depression significantly affecting the quality of life unable to get any help from [facility]      06/24/24 1317             Mode of Visit: In person    Psychiatry Consult Evaluation  Service Date: June 25, 2024 LOS:  LOS: 0 days  Chief Complaint I've been going down since my husband passed in 2020.  Primary Psychiatric Diagnoses  MDD, severe, without psychosis 2.  GAD   Assessment  Michele Lucas is a 58 y.o. female admitted: Medicallyfor 06/23/2024 11:57 AM for PMH significant for COPD, emphysema, no longer smoking, Raynaud's syndrome, fibromyalgia, chronic back pain while serving in the [employer], PTSD.  7/16, patient was brought to the ED by family with complaint of shortness of breath, generalized weakness, failure to thrive. At the time of my evaluation, patient was in bed.  Alert, awake, oriented x 3 and able to give details of the history.  Very sad affect and tearful at times.  Family not at bedside.  Patient states since her husband died 5 years ago, she has been in grief, lost significant weight and never been able to 'come back normal'. Over the years, she has lost about 88 pounds. Lives at home alone, daughter lives nearby, per Dr. Reichert on admission.  Her current presentation of dysphoric mood, low motivation and energy, and an occasional, passive suicidal thought is most consistent with MDD. Current outpatient psychotropic medications include none.  On initial examination, patient was vomiting with the nurse in the process of giving medication.  Visited again and the assessment got  interrupted due to her needing to leave for a swallow test, will continue follow up tomorrow. Please see plan below for detailed recommendations.   Diagnoses:  Active Hospital problems: Principal Problem:   Dyspnea on exertion Active Problems:   Major depressive disorder, recurrent episode, moderate (HCC)    Plan   ## Psychiatric Medication Recommendations:  Will discuss with her tomorrow  ## Medical Decision Making Capacity: Not specifically addressed in this encounter  ## Further Work-up:  -- most recent EKG on 06/25/2027 had QtC of 443 -- Pertinent labwork reviewed earlier this admission includes: CBC, chem panel, U/A, and EKG  ## Disposition:-- There are no psychiatric contraindications to discharge at this time  ## Behavioral / Environmental: - No specific recommendations at this time.    ## Safety and Observation Level:  - Based on my clinical evaluation, I estimate the patient to be at low risk of self harm in the current setting. - At this time, we recommend  routine. This decision is based on my review of the chart including patient's history and current presentation, interview of the patient, mental status examination, and consideration of suicide risk including evaluating suicidal ideation, plan, intent, suicidal or self-harm behaviors, risk factors, and protective factors. This judgment is based on our ability to directly address suicide risk, implement suicide prevention strategies, and develop a safety plan while the patient is in the clinical setting. Please contact our team if there is a  concern that risk level has changed.  CSSR Risk Category:C-SSRS RISK CATEGORY: No Risk  Suicide Risk Assessment: Patient has following modifiable risk factors for suicide: untreated depression and current symptoms: anxiety/panic, insomnia, impulsivity, anhedonia, hopelessness, which we are addressing by recommending medications and therapy when a full assessment can be made  tomorrow. Patient has following non-modifiable or demographic risk factors for suicide: none Patient has the following protective factors against suicide: Supportive family, no history of suicide attempts, and no history of NSSIB  Thank you for this consult request. Recommendations have been communicated to the primary team.  We will continue to follow at this time.   Sharlot Becker, NP       History of Present Illness  Relevant Aspects of [facility] Course:  Admitted on 06/23/2024 for PMH significant for COPD, emphysema, no longer smoking, Raynaud's syndrome, fibromyalgia, chronic back pain while serving in the [employer], PTSD.  7/16, patient was brought to the ED by family with complaint of shortness of breath, generalized weakness, failure to thrive. At the time of my evaluation, patient was in bed.  Alert, awake, oriented x 3 and able to give details of the history.  Very sad affect and tearful at times.  Family not at bedside.  Patient states since her husband died 5 years ago, she has been in grief, lost significant weight and never been able to 'come back normal'. Over the years, she has lost about 88 pounds. Lives at home alone, daughter lives nearby, per Dr. Chapman on admission.  Patient Report:  Patient was initially vomiting on the first visit.  The second visit she was sitting in a recliner awaiting a swallow study.  She reported her symptoms started after her husband passed in 2020 after she took care of him since his illness began in 2000.  I tried to bounce back but just not there.  Now, she lives alone with low motivation and energy, N/V with significant weight loss, low appetite, and dysphoric mood affecting her functioning.  Anxiety has always been high with constant worry that she cannot control, restless feelings with panic attacks every couple of days.  No hallucinations or paranoia.  Trauma with all that referring to symptoms of flashbacks and nightmares.  Discussed  care at the [facility] and she stated they did not help her.  When the [facility] and other outpatient clinics discussed there at her [facility] location (this provider worked there), she said, I didn't have time for that, I was taking care of my husband.  The assessment was cut short due to transport arrival to take her to her swallow study, will follow up tomorrow.  Psych ROS:  Depression: high Anxiety:  high Mania (lifetime and current): none Psychosis: (lifetime and current): none  Review of Systems  Constitutional:  Positive for malaise/fatigue.  HENT: Negative.    Eyes: Negative.   Respiratory: Negative.    Cardiovascular: Negative.   Gastrointestinal:  Positive for nausea and vomiting.  Genitourinary: Negative.   Musculoskeletal: Negative.   Skin: Negative.   Neurological: Negative.   Endo/Heme/Allergies: Negative.   Psychiatric/Behavioral:  Positive for depression. The patient is nervous/anxious.      Psychiatric and Social History  Psychiatric History:  Information collected from patient and chart.  Prev Dx/Sx: PTSD, anxiety Current Psych Provider: none Home Meds (current): no psych medications Previous Med Trials: mirtazapine  Therapy: none  Prior Psych Hospitalization: none  Prior Self Harm: none Prior Violence: none  Family Psych History: none Family Hx suicide: none  Social History:  Occupational Hx: past Doctor, hospital Hx: none Living Situation: lives alone  Access to weapons/lethal means: none   Substance History Denies substance abuse  Exam Findings  Physical Exam: completed by the MD, reviewed by this practitioner. Vital Signs:  Temp:  [98.1 F (36.7 C)-98.5 F (36.9 C)] 98.1 F (36.7 C) (07/18 0540) Pulse Rate:  [81-92] 85 (07/18 0540) Resp:  [16-20] 20 (07/18 0540) BP: (110-133)/(69-79) 126/70 (07/18 0540) SpO2:  [94 %-100 %] 94 % (07/18 0640) Blood pressure 126/70, pulse 85, temperature 98.1 F (36.7 C), temperature source Oral, resp.  rate 20, height 5' 2 (1.575 m), weight 44.1 kg, SpO2 94%. Body mass index is 17.78 kg/m.  Physical Exam  Mental Status Exam: General Appearance: Disheveled  Orientation:  Full (Time, Place, and Person)  Memory:  Immediate;   Fair Recent;   Fair Remote;   Fair  Concentration:  Concentration: Fair and Attention Span: Fair  Recall:  Fair  Attention  Fair  Eye Contact:  Fair  Speech:  Clear and Coherent  Language:  Good  Volume:  Normal  Mood: anxious and depressed  Affect:  Congruent  Thought Process:  Coherent  Thought Content:  Logical  Suicidal Thoughts:  No  Homicidal Thoughts:  No  Judgement:  Fair  Insight:  Fair  Psychomotor Activity:  Decreased  Akathisia:  No  Fund of Knowledge:  Good      Assets:  Housing Leisure Time Resilience Social Support  Cognition:  WNL  ADL's:  Intact  AIMS (if indicated):        Other History   These have been pulled in through the EMR, reviewed, and updated if appropriate.  Family History:  The patient's family history includes Diabetes type II in an other family member.  Medical History: Past Medical History:  Diagnosis Date   Anxiety    Chronic back pain    COPD, mild (HCC)    Emphysema lung (HCC)    Endometriosis    Fibromyalgia    Grade I diastolic dysfunction 11/26/2021   Kidney stones    Neuropathic pain    PTSD (post-traumatic stress disorder)     Surgical History: Past Surgical History:  Procedure Laterality Date   CESAREAN SECTION     copolscopy     TONSILLECTOMY     TUBAL LIGATION       Medications:   Current Facility-Administered Medications:    0.9 %  sodium chloride  infusion, , Intravenous, Continuous, Dahal, Binaya, MD, Last Rate: 75 mL/hr at 06/24/24 1503, Infusion Verify at 06/24/24 1503   albuterol  (VENTOLIN  HFA) 108 (90 Base) MCG/ACT inhaler 2 puff, 2 puff, Inhalation, 5 X Daily PRN, Neysa Caron PARAS, DO, 2 puff at 06/25/24 9360   bisacodyl  (DULCOLAX) EC tablet 5 mg, 5 mg, Oral, Daily PRN,  Dahal, Binaya, MD   feeding supplement (GLUCERNA SHAKE) (GLUCERNA SHAKE) liquid 237 mL, 237 mL, Oral, TID BM, Dahal, Binaya, MD, 237 mL at 06/25/24 0828   fluticasone furoate-vilanterol (BREO ELLIPTA) 200-25 MCG/ACT 1 puff, 1 puff, Inhalation, Daily, Andrez, Abigail, NP, 1 puff at 06/25/24 9188   hydrALAZINE  (APRESOLINE ) injection 10 mg, 10 mg, Intravenous, Q6H PRN, Dahal, Binaya, MD   melatonin tablet 5 mg, 5 mg, Oral, QHS PRN, Chavez, Abigail, NP, 5 mg at 06/24/24 2351   methocarbamol  (ROBAXIN ) tablet 500 mg, 500 mg, Oral, Q6H PRN, Chavez, Abigail, NP, 500 mg at 06/25/24 0602   oxyCODONE  (Oxy IR/ROXICODONE ) immediate release tablet 5 mg, 5 mg, Oral, Q6H  PRN, Dahal, Binaya, MD, 5 mg at 06/25/24 0544   pantoprazole  (PROTONIX ) EC tablet 40 mg, 40 mg, Oral, Daily, Young, Travis J, DO, 40 mg at 06/25/24 9171   polyethylene glycol (MIRALAX  / GLYCOLAX ) packet 17 g, 17 g, Oral, Daily PRN, Dahal, Binaya, MD   umeclidinium bromide  (INCRUSE ELLIPTA ) 62.5 MCG/ACT 1 puff, 1 puff, Inhalation, Daily, Dahal, Binaya, MD, 1 puff at 06/25/24 0812  Allergies: Allergies  Allergen Reactions   Guaifenesin & Derivatives Shortness Of Breath and Other (See Comments)    Mucinex brand   Pork-Derived Products Other (See Comments)    Patient prefers to not eat any pork/swine    Robafen Dm [Guaifenesin-Dm] Shortness Of Breath   Cymbalta [Duloxetine Hcl] Nausea And Vomiting   Ibuprofen  Other (See Comments)    Upset stomach- abdominal pain   Neurontin [Gabapentin] Other (See Comments)    Reported by [facility] - slowed heart rate, -per pt   Prozac [Fluoxetine Hcl] Other (See Comments)    Loss of appetite, sedation   Tylenol  [Acetaminophen ] Other (See Comments)    Upset stomach   Ampicillin Hives, Nausea Only, Rash and Other (See Comments)    Severe nausea and WELTS   Clindamycin /Lincomycin Swelling, Rash and Other (See Comments)    Had these reactions in feet, ankles and legs.   Penicillins  Hives, Rash and Other (See Comments)    And WELTS    Sharlot Becker, NP

## 2024-06-25 NOTE — Consult Note (Addendum)
 Cardiology Consultation   Patient ID: Aliscia Clayton MRN: [id number]; DOB: 1966-11-16  Admit date: 06/23/2024 Date of Consult: 06/25/2024  PCP:  Phyllistine Lenis, MD   [facility] Providers Cardiologist:  None     Patient Profile: Juanette Urizar is a 58 y.o. female with a hx of anxiety, COPD/emphysema, Raynaud's syndrome, fibromyalgia, chronic back pain, PTSD who is being seen 06/25/2024 for the evaluation of aortic regurgitation at the request of Dr. Arlice.  History of Present Illness: Ms. Demeo is a 58 year old with above medical history. Per chart review, does not appear that patient has ever been seen by a cardiologist.   Previously underwent echocardiogram in 05/2022 while admitted with a COPD exacerbation and PNA. Echocardiogram showed EF 60-65%, no regional wall motion abnormalities, grade I DD, normal RV systolic function. AV was not well seen, but valve was calcified and on PSL images bows like it may be bicuspid. Moderate AI and moderate AS.   Patient presented to the ED on 7/16. Daughter reported that patient had intermittent episodes of shortness of breath, failure to thrive. Weight down to 88 lbs. In the ED, patient reported decreased appetite that had been going on for a long time. Had stomach pain, weakness. Followed by a nutritionist through the [facility], family members were told that she was critically malnourished. In the ED, CMP was within normal limits aside from glucose 117. CBC showed normal WBC, hemoglobin. Platelets 127. CXR showed no active disease, unchanged hyperinflated lungs. CT abdomen pelvis showed no acute inflammatory process within the abdomen or pelvis. hsTn negative x2. TSH and AM cortisol within normal limits.   Echocardiogram 7/17 showed suspected bicuspid aortic valve, heavily calcified and thickened. Severe AI, moderate AS. EF 65-70%, no regional wall motion abnormalities, grade I DD, normal RV systolic function, mild-moderate MR.   On interview today,  patient denies any known cardiac history and has never been seen by cardiologist.  Does report that a long time ago she had to have an echocardiogram through the [facility] and the technologist told her that she had 2 heart murmurs.  She denies ever being worked up further for these.  Reports that she has been having a hard time for the past 5 years or so ever since her husband passed away.  This is caused her a lot of grief in her life.  She has been trying to eat and stay active but this has been a struggle for her.  [facility] but has not felt like she got the resources she needed.  She has been losing quite a bit of weight.  Her daughter takes care of her, besides if she does not feel like she has much social support.    Reports that she has COPD and has somewhat chronic shortness of breath.  Recently, feels like her breathing has been a bit worse than usual.  She has also been having issues with orthopnea, cannot sleep unless her head is propped up.  Denies lower extremity swelling.  She has been having persistent right sided chest discomfort.  This pain is constant.  Not positional or related to exertion.  Past Medical History:  Diagnosis Date   Anxiety    Chronic back pain    COPD, mild (HCC)    Emphysema lung (HCC)    Endometriosis    Fibromyalgia    Grade I diastolic dysfunction 11/26/2021   Kidney stones    Neuropathic pain    PTSD (post-traumatic stress disorder)  Past Surgical History:  Procedure Laterality Date   CESAREAN SECTION     copolscopy     TONSILLECTOMY     TUBAL LIGATION       Home Medications:  Prior to Admission medications   Medication Sig Start Date End Date Taking? Authorizing Provider  albuterol  (PROVENTIL  HFA;VENTOLIN  HFA) 108 (90 Base) MCG/ACT inhaler Inhale 2 puffs into the lungs 5 (five) times daily.   Yes [provider]  albuterol  (PROVENTIL ) (2.5 MG/3ML) 0.083% nebulizer solution Take 3 mLs (2.5 mg total) by nebulization  every 6 (six) hours as needed for wheezing or shortness of breath. 01/07/22  Yes Kehrli, Kelsey F, PA-C  benzonatate  (TESSALON ) 200 MG capsule Take 1 capsule (200 mg total) by mouth 3 (three) times daily as needed for cough. 05/27/22  Yes Sheikh, Omair Latif, DO  fluticasone-salmeterol (WIXELA INHUB) 500-50 MCG/ACT AEPB Inhale 1 puff into the lungs in the morning and at bedtime.   Yes [provider]  hydrOXYzine  (ATARAX ) 25 MG tablet Take 25 mg by mouth in the morning, at noon, in the evening, and at bedtime.   Yes [provider]  lactose free nutrition (BOOST PLUS) LIQD Take 237 mLs by mouth 3 (three) times daily with meals. Patient taking differently: Take 237 mLs by mouth See admin instructions. Drink 237 ml's by mouth (CHOCOLATE) three times a day with meals 05/27/22  Yes Sheikh, Omair Latif, DO  loperamide  (IMODIUM ) 2 MG capsule Take 2 mg by mouth as needed for diarrhea or loose stools.   Yes [provider]  oxyCODONE  (OXY IR/ROXICODONE ) 5 MG immediate release tablet Take 1 tablet (5 mg total) by mouth 3 (three) times daily as needed for severe pain. Patient taking differently: Take 5 mg by mouth 3 (three) times daily. 06/15/23  Yes Drusilla Sabas RAMAN, MD  predniSONE  (DELTASONE ) 10 MG tablet Take 20 mg by mouth See admin instructions. Take 20 mg by mouth with food once a day as directed for COPD flares   Yes [provider]  Tiotropium Bromide  Monohydrate (SPIRIVA  RESPIMAT) 1.25 MCG/ACT AERS Inhale 2 puffs into the lungs in the morning.   Yes [provider]  azithromycin  (ZITHROMAX  Z-PAK) 250 MG tablet 2 po day one, then 1 daily x 4 days Patient not taking: Reported on 06/24/2024 11/01/23   Nivia Colon, PA-C  Multiple Vitamin (MULTIVITAMIN WITH MINERALS) TABS tablet Take 1 tablet by mouth daily. Patient not taking: Reported on 06/24/2024 05/28/22   Sherrill Cable Latif, DO  pantoprazole  (PROTONIX ) 40 MG tablet Take 1 tablet (40 mg total) by mouth daily. Patient  not taking: Reported on 06/24/2024 06/15/23 06/24/24  Drusilla Sabas RAMAN, MD  predniSONE  (DELTASONE ) 20 MG tablet 3 tabs po day one, then 2 tabs daily x 4 days Patient not taking: Reported on 06/24/2024 11/01/23   Nivia Colon, PA-C    Scheduled Meds:  feeding supplement (GLUCERNA SHAKE)  237 mL Oral TID BM   fluticasone furoate-vilanterol  1 puff Inhalation Daily   pantoprazole   40 mg Oral Daily   umeclidinium bromide   1 puff Inhalation Daily   Continuous Infusions:  sodium chloride  75 mL/hr at 06/24/24 1503   PRN Meds: albuterol , bisacodyl , hydrALAZINE , melatonin, methocarbamol , oxyCODONE , polyethylene glycol  Allergies:    Allergies  Allergen Reactions   Guaifenesin & Derivatives Shortness Of Breath and Other (See Comments)    Mucinex brand   Pork-Derived Products Other (See Comments)    Patient prefers to not eat any pork/swine    Robafen Dm [  Guaifenesin-Dm] Shortness Of Breath   Cymbalta [Duloxetine Hcl] Nausea And Vomiting   Ibuprofen  Other (See Comments)    Upset stomach- abdominal pain   Neurontin [Gabapentin] Other (See Comments)    Reported by [facility] - slowed heart rate, -per pt   Prozac [Fluoxetine Hcl] Other (See Comments)    Loss of appetite, sedation   Tylenol  [Acetaminophen ] Other (See Comments)    Upset stomach   Ampicillin Hives, Nausea Only, Rash and Other (See Comments)    Severe nausea and WELTS   Clindamycin /Lincomycin Swelling, Rash and Other (See Comments)    Had these reactions in feet, ankles and legs.   Penicillins Hives, Rash and Other (See Comments)    And WELTS    Social History:   Social History   Socioeconomic History   Marital status: Married    Spouse name: Not on file   Number of children: Not on file   Years of education: Not on file   Highest education level: Not on file  Occupational History   Not on file  Tobacco Use   Smoking status: Former    Current packs/day: 0.00    Types: Cigarettes    Quit date:  11/25/2021    Years since quitting: 2.5   Smokeless tobacco: Current   Tobacco comments:    Previously documented in a pack per day-she now reports 4 to 5 cigarettes/day  Vaping Use   Vaping status: Never Used  Substance and Sexual Activity   Alcohol use: No   Drug use: No   Sexual activity: Yes    Birth control/protection: Surgical  Other Topics Concern   Not on file  Social History Narrative   Not on file   Social Drivers of Health   Financial Resource Strain: Not on file  Food Insecurity: No Food Insecurity (06/24/2024)   Hunger Vital Sign    Worried About Running Out of Food in the Last Year: Never true    Ran Out of Food in the Last Year: Never true  Transportation Needs: No Transportation Needs (06/24/2024)   PRAPARE - Administrator, Civil Service (Medical): No    Lack of Transportation (Non-Medical): No  Physical Activity: Not on file  Stress: Not on file  Social Connections: Not on file  Intimate Partner Violence: Not At Risk (06/24/2024)   Humiliation, Afraid, Rape, and Kick questionnaire    Fear of Current or Ex-Partner: No    Emotionally Abused: No    Physically Abused: No    Sexually Abused: No    Family History:   Family History  Problem Relation Age of Onset   Diabetes type II Other    Stroke Neg Hx      ROS:  Please see the history of present illness.  All other ROS reviewed and negative.     Physical Exam/Data: Vitals:   06/24/24 2008 06/25/24 0001 06/25/24 0540 06/25/24 0640  BP: 118/71 133/76 126/70   Pulse: 85 81 85   Resp: 20 18 20    Temp: 98.3 F (36.8 C)  98.1 F (36.7 C)   TempSrc: Oral  Oral   SpO2: 96% 94% 95% 94%  Weight:      Height:        Intake/Output Summary (Last 24 hours) at 06/25/2024 0835 Last data filed at 06/24/2024 1600 Gross per 24 hour  Intake 347.02 ml  Output --  Net 347.02 ml      06/23/2024    5:23 PM 11/01/2023  9:32 AM 11/01/2023    9:04 AM  Last 3 Weights  Weight (lbs) 97 lb 3.6 oz 97  lb 3.6 oz 97 lb 3.6 oz  Weight (kg) 44.1 kg 44.1 kg 44.1 kg     Body mass index is 17.78 kg/m.  General:  Thin, frail female. Sitting upright in the bed in no acute distress. Eating breakfast  HEENT: normal Neck: no JVD Cardiac:  normal S1, S2; RRR; grade 2/6 systolic murmur and 1/6 diastolic murmur  Lungs:  clear to auscultation bilaterally, no wheezing, rhonchi or rales  Abd: soft, nontender  Ext: no edema in BLE  Musculoskeletal:  No deformities  Skin: warm and dry  Neuro:  CNs 2-12 intact, no focal abnormalities noted Psych:  Normal affect   EKG: Sinus rhythm, heart rate 93 bpm, possible RA enlargement Telemetry:  Telemetry was personally reviewed and demonstrates:  NSR   Relevant CV Studies: Cardiac Studies & Procedures   ______________________________________________________________________________________________     ECHOCARDIOGRAM  ECHOCARDIOGRAM COMPLETE 06/24/2024  Narrative ECHOCARDIOGRAM REPORT    Patient Name:   IMUNIQUE SAMAD Date of Exam: 06/24/2024 Medical Rec #:  [id number]     Height:       62.0 in Accession #:    [id number]    Weight:       97.2 lb Date of Birth:  Dec 31, 1965      BSA:          1.407 m Patient Age:    57 years      BP:           119/71 mmHg Patient Gender: F             HR:           79 bpm. Exam Location:  Inpatient  Procedure: 2D Echo, Cardiac Doppler and Color Doppler (Both Spectral and Color Flow Doppler were utilized during procedure).  Indications:    R07.9* Chest pain, unspecified; R06.02 SOB  History:        Patient has prior history of Echocardiogram examinations, most recent 05/24/2022. COPD, Aortic Valve Disease, Signs/Symptoms:Shortness of Breath and Dyspnea; Risk Factors:Current Smoker.  Sonographer:    Ellouise Mose RDCS Referring Phys: [id number] [facility]  IMPRESSIONS   1. Suspect bicuspid aortic valve, heavily calcififed and thickened, severe AR, AS likley more moderate. The aortic valve is calcified. Aortic valve  regurgitation is severe. Moderate aortic valve stenosis. Aortic valve mean gradient measures 25.0 mmHg. Aortic valve Vmax measures 3.20 m/s. 2. Left ventricular ejection fraction, by estimation, is 65 to 70%. The left ventricle has normal function. The left ventricle has no regional wall motion abnormalities. Left ventricular diastolic parameters are consistent with Grade I diastolic dysfunction (impaired relaxation). 3. Right ventricular systolic function is normal. The right ventricular size is normal. 4. Large pleural effusion. 5. The mitral valve is degenerative. Mild to moderate mitral valve regurgitation. No evidence of mitral stenosis.  FINDINGS Left Ventricle: Left ventricular ejection fraction, by estimation, is 65 to 70%. The left ventricle has normal function. The left ventricle has no regional wall motion abnormalities. The left ventricular internal cavity size was normal in size. There is no left ventricular hypertrophy. Left ventricular diastolic parameters are consistent with Grade I diastolic dysfunction (impaired relaxation).  Right Ventricle: The right ventricular size is normal. No increase in right ventricular wall thickness. Right ventricular systolic function is normal.  Left Atrium: Left atrial size was normal in size.  Right Atrium: Right atrial size was normal in size.  Pericardium: There is no evidence of pericardial effusion.  Mitral Valve: The mitral valve is degenerative in appearance. Mild to moderate mitral valve regurgitation. No evidence of mitral valve stenosis.  Tricuspid Valve: The tricuspid valve is normal in structure. Tricuspid valve regurgitation is mild . No evidence of tricuspid stenosis.  Aortic Valve: Suspect bicuspid aortic valve, heavily calcififed and thickened, severe AR, AS likley more moderate. The aortic valve is calcified. Aortic valve regurgitation is severe. Aortic regurgitation PHT measures 310 msec. Moderate aortic stenosis is present.  Aortic valve mean gradient measures 25.0 mmHg. Aortic valve peak gradient measures 41.1 mmHg. Aortic valve area, by VTI measures 1.81 cm.  Pulmonic Valve: The pulmonic valve was normal in structure. Pulmonic valve regurgitation is not visualized. No evidence of pulmonic stenosis.  Aorta: The aortic root and ascending aorta are structurally normal, with no evidence of dilitation.  IAS/Shunts: No atrial level shunt detected by color flow Doppler.  Additional Comments: There is a large pleural effusion.   LEFT VENTRICLE PLAX 2D LVIDd:         3.70 cm     Diastology LVIDs:         2.40 cm     LV e' medial:    8.05 cm/s LV PW:         1.10 cm     LV E/e' medial:  10.5 LV IVS:        1.00 cm     LV e' lateral:   8.59 cm/s LVOT diam:     2.10 cm     LV E/e' lateral: 9.9 LV SV:         117 LV SV Index:   83 LVOT Area:     3.46 cm  LV Volumes (MOD) LV vol d, MOD A2C: 89.9 ml LV vol d, MOD A4C: 74.8 ml LV vol s, MOD A2C: 32.0 ml LV vol s, MOD A4C: 23.1 ml LV SV MOD A2C:     57.9 ml LV SV MOD A4C:     74.8 ml LV SV MOD BP:      58.3 ml  IVC IVC diam: 2.30 cm  LEFT ATRIUM           Index        RIGHT ATRIUM           Index LA diam:      2.10 cm 1.49 cm/m   RA Area:     12.10 cm LA Vol (A2C): 20.3 ml 14.43 ml/m  RA Volume:   29.60 ml  21.04 ml/m LA Vol (A4C): 11.8 ml 8.39 ml/m AORTIC VALVE AV Area (Vmax):    1.96 cm AV Area (Vmean):   1.81 cm AV Area (VTI):     1.81 cm AV Vmax:           320.40 cm/s AV Vmean:          228.000 cm/s AV VTI:            0.650 m AV Peak Grad:      41.1 mmHg AV Mean Grad:      25.0 mmHg LVOT Vmax:         181.00 cm/s LVOT Vmean:        119.000 cm/s LVOT VTI:          0.339 m LVOT/AV VTI ratio: 0.52 AI PHT:            310 msec  AORTA Ao Root diam: 3.10 cm Ao Asc diam:  3.10 cm  MITRAL VALVE               TRICUSPID VALVE MV Area (PHT): 3.37 cm    TR Peak grad:   25.2 mmHg MV Decel Time: 225 msec    TR Vmax:        251.00 cm/s MR Peak  grad: 96.4 mmHg MR Mean grad: 52.0 mmHg    SHUNTS MR Vmax:      491.00 cm/s  Systemic VTI:  0.34 m MR Vmean:     327.0 cm/s   Systemic Diam: 2.10 cm MV E velocity: 84.80 cm/s MV A velocity: 73.70 cm/s MV E/A ratio:  1.15  Morene Brownie Electronically signed by Morene Brownie Signature Date/Time: 06/24/2024/5:54:18 PM    Final          ______________________________________________________________________________________________       Laboratory Data: High Sensitivity Troponin:   Recent Labs  Lab 06/24/24 0855 06/24/24 1348  TROPONINIHS 3 3     Chemistry Recent Labs  Lab 06/23/24 1328  NA 144  K 3.9  CL 111  CO2 25  GLUCOSE 117*  BUN 13  CREATININE 0.50  CALCIUM 9.8  MG 2.1  GFRNONAA >60  ANIONGAP 8    Recent Labs  Lab 06/23/24 1328  PROT 6.7  ALBUMIN 3.8  AST 18  ALT 11  ALKPHOS 45  BILITOT 1.0   Lipids No results for input(s): CHOL, TRIG, HDL, LABVLDL, LDLCALC, CHOLHDL in the last 168 hours.  Hematology Recent Labs  Lab 06/23/24 1328  WBC 6.8  RBC 4.20  HGB 13.4  HCT 43.1  MCV 102.6*  MCH 31.9  MCHC 31.1  RDW 12.6  PLT 127*   Thyroid  Recent Labs  Lab 06/24/24 1348  TSH 0.361    BNPNo results for input(s): BNP, PROBNP in the last 168 hours.  DDimer No results for input(s): DDIMER in the last 168 hours.  Radiology/Studies:  ECHOCARDIOGRAM COMPLETE Result Date: 06/24/2024    ECHOCARDIOGRAM REPORT   Patient Name:   CHARMAYNE ODELL Date of Exam: 06/24/2024 Medical Rec #:  [id number]     Height:       62.0 in Accession #:    [id number]    Weight:       97.2 lb Date of Birth:  1966/11/24      BSA:          1.407 m Patient Age:    57 years      BP:           119/71 mmHg Patient Gender: F             HR:           79 bpm. Exam Location:  Inpatient Procedure: 2D Echo, Cardiac Doppler and Color Doppler (Both Spectral and Color            Flow Doppler were utilized during procedure). Indications:    R07.9* Chest pain,  unspecified; R06.02 SOB  History:        Patient has prior history of Echocardiogram examinations, most                 recent 05/24/2022. COPD, Aortic Valve Disease,                 Signs/Symptoms:Shortness of Breath and Dyspnea; Risk                 Factors:Current Smoker.  Sonographer:    Ellouise Mose RDCS Referring Phys: [id number] [facility]  IMPRESSIONS  1. Suspect bicuspid aortic valve, heavily calcififed and thickened, severe AR, AS likley more moderate. The aortic valve is calcified. Aortic valve regurgitation is severe. Moderate aortic valve stenosis. Aortic valve mean gradient measures 25.0 mmHg. Aortic valve Vmax measures 3.20 m/s.  2. Left ventricular ejection fraction, by estimation, is 65 to 70%. The left ventricle has normal function. The left ventricle has no regional wall motion abnormalities. Left ventricular diastolic parameters are consistent with Grade I diastolic dysfunction (impaired relaxation).  3. Right ventricular systolic function is normal. The right ventricular size is normal.  4. Large pleural effusion.  5. The mitral valve is degenerative. Mild to moderate mitral valve regurgitation. No evidence of mitral stenosis. FINDINGS  Left Ventricle: Left ventricular ejection fraction, by estimation, is 65 to 70%. The left ventricle has normal function. The left ventricle has no regional wall motion abnormalities. The left ventricular internal cavity size was normal in size. There is  no left ventricular hypertrophy. Left ventricular diastolic parameters are consistent with Grade I diastolic dysfunction (impaired relaxation). Right Ventricle: The right ventricular size is normal. No increase in right ventricular wall thickness. Right ventricular systolic function is normal. Left Atrium: Left atrial size was normal in size. Right Atrium: Right atrial size was normal in size. Pericardium: There is no evidence of pericardial effusion. Mitral Valve: The mitral valve is degenerative in appearance. Mild  to moderate mitral valve regurgitation. No evidence of mitral valve stenosis. Tricuspid Valve: The tricuspid valve is normal in structure. Tricuspid valve regurgitation is mild . No evidence of tricuspid stenosis. Aortic Valve: Suspect bicuspid aortic valve, heavily calcififed and thickened, severe AR, AS likley more moderate. The aortic valve is calcified. Aortic valve regurgitation is severe. Aortic regurgitation PHT measures 310 msec. Moderate aortic stenosis is present. Aortic valve mean gradient measures 25.0 mmHg. Aortic valve peak gradient measures 41.1 mmHg. Aortic valve area, by VTI measures 1.81 cm. Pulmonic Valve: The pulmonic valve was normal in structure. Pulmonic valve regurgitation is not visualized. No evidence of pulmonic stenosis. Aorta: The aortic root and ascending aorta are structurally normal, with no evidence of dilitation. IAS/Shunts: No atrial level shunt detected by color flow Doppler. Additional Comments: There is a large pleural effusion.  LEFT VENTRICLE PLAX 2D LVIDd:         3.70 cm     Diastology LVIDs:         2.40 cm     LV e' medial:    8.05 cm/s LV PW:         1.10 cm     LV E/e' medial:  10.5 LV IVS:        1.00 cm     LV e' lateral:   8.59 cm/s LVOT diam:     2.10 cm     LV E/e' lateral: 9.9 LV SV:         117 LV SV Index:   83 LVOT Area:     3.46 cm  LV Volumes (MOD) LV vol d, MOD A2C: 89.9 ml LV vol d, MOD A4C: 74.8 ml LV vol s, MOD A2C: 32.0 ml LV vol s, MOD A4C: 23.1 ml LV SV MOD A2C:     57.9 ml LV SV MOD A4C:     74.8 ml LV SV MOD BP:      58.3 ml IVC IVC diam: 2.30 cm LEFT ATRIUM           Index        RIGHT ATRIUM  Index LA diam:      2.10 cm 1.49 cm/m   RA Area:     12.10 cm LA Vol (A2C): 20.3 ml 14.43 ml/m  RA Volume:   29.60 ml  21.04 ml/m LA Vol (A4C): 11.8 ml 8.39 ml/m  AORTIC VALVE AV Area (Vmax):    1.96 cm AV Area (Vmean):   1.81 cm AV Area (VTI):     1.81 cm AV Vmax:           320.40 cm/s AV Vmean:          228.000 cm/s AV VTI:            0.650  m AV Peak Grad:      41.1 mmHg AV Mean Grad:      25.0 mmHg LVOT Vmax:         181.00 cm/s LVOT Vmean:        119.000 cm/s LVOT VTI:          0.339 m LVOT/AV VTI ratio: 0.52 AI PHT:            310 msec  AORTA Ao Root diam: 3.10 cm Ao Asc diam:  3.10 cm MITRAL VALVE               TRICUSPID VALVE MV Area (PHT): 3.37 cm    TR Peak grad:   25.2 mmHg MV Decel Time: 225 msec    TR Vmax:        251.00 cm/s MR Peak grad: 96.4 mmHg MR Mean grad: 52.0 mmHg    SHUNTS MR Vmax:      491.00 cm/s  Systemic VTI:  0.34 m MR Vmean:     327.0 cm/s   Systemic Diam: 2.10 cm MV E velocity: 84.80 cm/s MV A velocity: 73.70 cm/s MV E/A ratio:  1.15 Morene Brownie Electronically signed by Morene Brownie Signature Date/Time: 06/24/2024/5:54:18 PM    Final    CT Angio Chest PE W and/or Wo Contrast Result Date: 06/24/2024 CLINICAL DATA:  Chest pain. EXAM: CT ANGIOGRAPHY CHEST WITH CONTRAST TECHNIQUE: Multidetector CT imaging of the chest was performed using the standard protocol during bolus administration of intravenous contrast. Multiplanar CT image reconstructions and MIPs were obtained to evaluate the vascular anatomy. RADIATION DOSE REDUCTION: This exam was performed according to the departmental dose-optimization program which includes automated exposure control, adjustment of the mA and/or kV according to patient size and/or use of iterative reconstruction technique. CONTRAST:  60mL OMNIPAQUE  IOHEXOL  350 MG/ML SOLN COMPARISON:  None Available. FINDINGS: Cardiovascular: Satisfactory opacification of the pulmonary arteries to the segmental level. No evidence of pulmonary embolism. Normal heart size. No pericardial effusion. Mediastinum/Nodes: No enlarged mediastinal, hilar, or axillary lymph nodes. Thyroid gland, trachea, and esophagus demonstrate no significant findings. Lungs/Pleura: No pneumothorax or pleural effusion is noted. Mild emphysematous disease is noted in the upper lobes. Minimal bilateral posterior basilar subsegmental  atelectasis is noted. Upper Abdomen: No acute abnormality. Musculoskeletal: No chest wall abnormality. No acute or significant osseous findings. Review of the MIP images confirms the above findings. IMPRESSION: No definite evidence of pulmonary embolism. Aortic Atherosclerosis (ICD10-I70.0) and Emphysema (ICD10-J43.9). Electronically Signed   By: Lynwood Landy Raddle M.D.   On: 06/24/2024 09:57   DG Chest 2 View Result Date: 06/23/2024 CLINICAL DATA:  Shortness of breath EXAM: CHEST - 2 VIEW COMPARISON:  Chest x-ray 11/01/2023.  CT of the chest 06/13/2023 FINDINGS: The heart size and mediastinal contours are within normal limits. Nodular density projects over the left  costophrenic angle similar to the prior study. This may represent a nipple shadow. The lungs are otherwise clear there is no pleural effusion or pneumothorax. The lungs are hyperinflated, unchanged. The visualized skeletal structures are unremarkable. IMPRESSION: 1. No active cardiopulmonary disease. 2. Hyperinflated lungs, unchanged. 3. Nodular density projects over the left costophrenic angle similar to the prior study. This may represent a nipple shadow. Repeat chest x-ray with nipple markers is recommended. Electronically Signed   By: Greig Pique M.D.   On: 06/23/2024 16:43   CT ABDOMEN PELVIS W CONTRAST Result Date: 06/23/2024 CLINICAL DATA:  Bowel obstruction suspected. Shortness of breath. Failure to thrive. Weight loss. EXAM: CT ABDOMEN AND PELVIS WITH CONTRAST TECHNIQUE: Multidetector CT imaging of the abdomen and pelvis was performed using the standard protocol following bolus administration of intravenous contrast. RADIATION DOSE REDUCTION: This exam was performed according to the departmental dose-optimization program which includes automated exposure control, adjustment of the mA and/or kV according to patient size and/or use of iterative reconstruction technique. CONTRAST:  OMNIPAQUE  IOHEXOL  300 MG/ML  SOLN COMPARISON:  CT scan  abdomen and pelvis from 02/13/2022. FINDINGS: Lower chest: There are patchy atelectatic changes in the visualized lung bases. No overt consolidation. No pleural effusion. The heart is normal in size. No pericardial effusion. Hepatobiliary: The liver is normal in size. Non-cirrhotic configuration. No suspicious mass. No intrahepatic or extrahepatic bile duct dilation. Gallbladder is surgically absent. Pancreas: Unremarkable. No pancreatic ductal dilatation or surrounding inflammatory changes. Spleen: Within normal limits. No focal lesion. Adrenals/Urinary Tract: Adrenal glands are unremarkable. No suspicious renal mass. There are several hypoattenuating structures in the left kidney with largest measuring up to 8 x 12 mm, which can be characterized as a simple cyst. There are at least 2, 1-1.5 mm nonobstructing calculi in the left kidney and a single 1-1.5 mm nonobstructing calculus in the right kidney lower pole. No hydroureteronephrosis or ureterolithiasis on either side. Urinary bladder is under distended, precluding optimal assessment. However, no large mass or stones identified. No perivesical fat stranding. Stomach/Bowel: No disproportionate dilation of the small or large bowel loops. No evidence of abnormal bowel wall thickening or inflammatory changes. The appendix was not visualized; however there is no acute inflammatory process in the right lower quadrant. Vascular/Lymphatic: No ascites or pneumoperitoneum. No abdominal or pelvic lymphadenopathy, by size criteria. No aneurysmal dilation of the major abdominal arteries. There are moderate peripheral atherosclerotic vascular calcifications of the aorta and its major branches. Reproductive: Not well evaluated on this CT scan exam. There is probable retroverted uterus noted. No large adnexal mass seen. Other: The visualized soft tissues and abdominal wall are unremarkable. Musculoskeletal: No suspicious osseous lesions. There are mild multilevel degenerative  changes in the visualized spine. IMPRESSION: 1. No acute inflammatory process identified within the abdomen or pelvis. No bowel obstruction. 2. Multiple other nonacute observations, as described above. Aortic Atherosclerosis (ICD10-I70.0). Electronically Signed   By: Ree Molt M.D.   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 Mild-moderate MR  - Patient currently admitted with failure to thrive. Will need patient's weight and strength up before pursing any invasive cardiac testing and procedures.  Can plan  outpatient cardiac MRI to quantify AI - Given dose of Lasix today.  She appears euvolemic, would hold off on further diuresis  Chest pain  - Patient reports constant right sided chest pain. Not positional, pleuritic, or exertional. hsTn negative x2. Echo with EF 65-70%, no wall motion abnormalities  - Pain is atypical for a cardiac source, no further cardiac workup at this time   Preop evaluation  - Prior to EGD.  She reported dysphagia and esophagram shows 50% distal esophageal narrowing.  May be contributing to her weight loss.  EGD is low risk procedure from cardiac standpoint, no further workup recommended prior to procedure  otherwise per primary  - generalized weakness, failure to thrive  - fibromyalgia, PTSD  - Chronic back pain   Risk Assessment/Risk Scores:   [place]  Heart Association (NYHA) Functional Class NYHA Class II    For questions or updates, please contact [facility] Please consult [email or web address] for contact info under    Signed, Rollo FABIENE Louder, PA-C  06/25/2024 8:35 AM  Patient seen and examined.  Agree with above  documentation.  Ms. Pagan is a 58 year old female with a history of COPD, Raynaud's, fibromyalgia, anxiety, PTSD who we were consulted for evaluation of aortic regurgitation at the request of Dr. Arlice.  During admission for COVID-19 infection in 05/2022 underwent echocardiogram which showed possible bicuspid valve with moderate AI and moderate AS.  She presented to the ED this admission on 7/16 with failure to thrive.  Weight was down to 88 pounds.  She reported poor appetite.  Echocardiogram 7/17 showed suspected bicuspid aortic valve, severe AI, moderate AS, EF 65 to 70%.  On exam, patient is cachectic, alert and oriented, regular rate and rhythm, 2/6 systolic murmur, lungs CTAB, no LE edema or JVD.  For her aortic valve disease, has moderate AS, severe AI on TTE.  On my review I think the AI may be more moderate.  No LV dilatation to suggest severe AI.  She is not a good candidate for any invasive procedures at this point given her cachexia.  Would also not be a TEE candidate with her esophageal stenosis for which she is undergoing GI workup.  Would suggest cardiac MRI as an outpatient to quantify her AI.  Her aortic valve disease will continue to progress and at some point she will need valve replacement.  Discussed importance of working on her nutrition and gaining weight to be better candidate for valve replacement in the future.  In regards to preop evaluation prior to EGD, given low risk procedure, no further cardiac workup recommended.    Lonni LITTIE Nanas, MD

## 2024-06-25 NOTE — Procedures (Signed)
 Patient presents for therapeutic thoracentesis. I was present during limited US  of the chest to evaluate for pleural effusion. US  limited shows no pleural fluid, consolidation, or atelectatic lung. There is no adequate pocket of fluid to safely proceed with thoracentesis. Due to circumstances unable to perform a safe thoracentesis.   Procedure not performed.   Brendalyn Vallely M Gerrianne Aydelott PA-C 06/25/2024 1:12 PM

## 2024-06-26 ENCOUNTER — Encounter (HOSPITAL_COMMUNITY): Payer: Self-pay | Admitting: Internal Medicine

## 2024-06-26 ENCOUNTER — Inpatient Hospital Stay (HOSPITAL_COMMUNITY): Admitting: Anesthesiology

## 2024-06-26 ENCOUNTER — Encounter (HOSPITAL_COMMUNITY)
Admission: EM | Disposition: A | Discharge status: Home Health | Payer: Self-pay | Source: Home / Self Care | Attending: Internal Medicine

## 2024-06-26 DIAGNOSIS — Z87891 Personal history of nicotine dependence: Secondary | ICD-10-CM | POA: Diagnosis not present

## 2024-06-26 DIAGNOSIS — K222 Esophageal obstruction: Secondary | ICD-10-CM | POA: Diagnosis not present

## 2024-06-26 DIAGNOSIS — J449 Chronic obstructive pulmonary disease, unspecified: Secondary | ICD-10-CM

## 2024-06-26 DIAGNOSIS — K2970 Gastritis, unspecified, without bleeding: Secondary | ICD-10-CM

## 2024-06-26 DIAGNOSIS — R0609 Other forms of dyspnea: Secondary | ICD-10-CM | POA: Diagnosis not present

## 2024-06-26 HISTORY — PX: ESOPHAGOGASTRODUODENOSCOPY: SHX5428

## 2024-06-26 HISTORY — PX: BONE BIOPSY: SHX375

## 2024-06-26 HISTORY — PX: ESOPHAGEAL DILATION: SHX303

## 2024-06-26 LAB — BASIC METABOLIC PANEL WITH GFR
Anion gap: 6 (ref 5–15)
BUN: 17 mg/dL (ref 6–20)
CO2: 28 mmol/L (ref 22–32)
Calcium: 8.7 mg/dL — ABNORMAL LOW (ref 8.9–10.3)
Chloride: 106 mmol/L (ref 98–111)
Creatinine, Ser: 0.55 mg/dL (ref 0.44–1.00)
GFR, Estimated: >60 mL/min (ref >60)
Glucose, Bld: 84 mg/dL (ref 70–99)
Potassium: 4 mmol/L (ref 3.5–5.1)
Sodium: 140 mmol/L (ref 135–145)

## 2024-06-26 LAB — CBC WITH DIFFERENTIAL/PLATELET
Abs Immature Granulocytes: 0.02 K/uL (ref 0.00–0.07)
Basophils Absolute: 0 K/uL (ref 0.0–0.1)
Basophils Relative: 0 %
Eosinophils Absolute: 0.1 K/uL (ref 0.0–0.5)
Eosinophils Relative: 2 %
HCT: 40.9 % (ref 36.0–46.0)
Hemoglobin: 12.7 g/dL (ref 12.0–15.0)
Immature Granulocytes: 0 %
Lymphocytes Relative: 28 %
Lymphs Abs: 1.6 K/uL (ref 0.7–4.0)
MCH: 32.3 pg (ref 26.0–34.0)
MCHC: 31.1 g/dL (ref 30.0–36.0)
MCV: 104.1 fL — ABNORMAL HIGH (ref 80.0–100.0)
Monocytes Absolute: 0.5 K/uL (ref 0.1–1.0)
Monocytes Relative: 8 %
Neutro Abs: 3.4 K/uL (ref 1.7–7.7)
Neutrophils Relative %: 62 %
Platelets: 111 K/uL — ABNORMAL LOW (ref 150–400)
RBC: 3.93 MIL/uL (ref 3.87–5.11)
RDW: 12.1 % (ref 11.5–15.5)
WBC: 5.6 K/uL (ref 4.0–10.5)
nRBC: 0 % (ref 0.0–0.2)

## 2024-06-26 LAB — MAGNESIUM: Magnesium: 2 mg/dL (ref 1.7–2.4)

## 2024-06-26 SURGERY — EGD (ESOPHAGOGASTRODUODENOSCOPY)
Anesthesia: Monitor Anesthesia Care

## 2024-06-26 MED ORDER — SODIUM CHLORIDE 0.9 % IV SOLN: INTRAVENOUS | Status: DC | PRN

## 2024-06-26 MED ORDER — PHENYLEPHRINE HCL (PRESSORS) 10 MG/ML IV SOLN
INTRAVENOUS | Status: DC | PRN
  Administered 2024-06-26: 160 ug via INTRAVENOUS

## 2024-06-26 MED ORDER — PROPOFOL 500 MG/50ML IV EMUL
INTRAVENOUS | Status: DC | PRN
  Administered 2024-06-26: 100 ug/kg/min via INTRAVENOUS

## 2024-06-26 MED ORDER — OXYCODONE HCL 5 MG PO TABS
5.0000 mg | ORAL_TABLET | Freq: Once | ORAL | Status: AC
  Administered 2024-06-26: 5 mg via ORAL
  Filled 2024-06-26: qty 1

## 2024-06-26 MED ORDER — OXYCODONE HCL 5 MG PO TABS
10.0000 mg | ORAL_TABLET | Freq: Four times a day (QID) | ORAL | Status: DC | PRN
  Administered 2024-06-26 – 2024-06-27 (×3): 10 mg via ORAL
  Filled 2024-06-26 (×3): qty 2

## 2024-06-26 MED ORDER — PROPOFOL 10 MG/ML IV BOLUS
INTRAVENOUS | Status: DC | PRN
Start: 2024-06-26 — End: 2024-06-26
  Administered 2024-06-26 (×2): 20 mg via INTRAVENOUS

## 2024-06-26 NOTE — Progress Notes (Signed)
 OT Cancellation Note  Patient Details Name: Michele Lucas MRN: [id number] DOB: 06/10/1966   Cancelled Treatment:    Reason Eval/Treat Not Completed: Patient at procedure or test/ unavailable (EGD) OT will continue to follow for evaluation as schedule allows.  Leita PARAS Gabe Glace 06/26/2024, 11:06 AM  Leita DEL OTR/L Acute Rehabilitation Services Office: [phone]

## 2024-06-26 NOTE — Anesthesia Preprocedure Evaluation (Signed)
 Anesthesia Evaluation  Patient identified by MRN, date of birth, ID band Patient awake    Reviewed: Allergy & Precautions, H&P , NPO status , Patient's Chart, lab work & pertinent test results  Airway Mallampati: II   Neck ROM: full    Dental   Pulmonary COPD, former smoker   breath sounds clear to auscultation       Cardiovascular negative cardio ROS  Rhythm:regular Rate:Normal     Neuro/Psych  PSYCHIATRIC DISORDERS Anxiety Depression       GI/Hepatic   Endo/Other    Renal/GU stones     Musculoskeletal  (+)  Fibromyalgia -  Abdominal   Peds  Hematology   Anesthesia Other Findings   Reproductive/Obstetrics                              Anesthesia Physical Anesthesia Plan  ASA: 2  Anesthesia Plan: MAC   Post-op Pain Management:    Induction: Intravenous  PONV Risk Score and Plan: 2 and Propofol  infusion and Treatment may vary due to age or medical condition  Airway Management Planned: Nasal Cannula  Additional Equipment:   Intra-op Plan:   Post-operative Plan:   Informed Consent: I have reviewed the patients History and Physical, chart, labs and discussed the procedure including the risks, benefits and alternatives for the proposed anesthesia with the patient or authorized representative who has indicated his/her understanding and acceptance.     Dental advisory given  Plan Discussed with: CRNA, Anesthesiologist and Surgeon  Anesthesia Plan Comments:         Anesthesia Quick Evaluation

## 2024-06-26 NOTE — Progress Notes (Signed)
 BRIEF PROGRESS NOTE:   EGD completed today, findings of esophageal stenosis, severe. Balloon dilation performed to 9 mm with some mild, mucosal disruption. Recommend full liquid diet for now given severity of esophageal stenosis. Needs repeat EGD with [facility] in 4 weeks for repeat therapy. Discussed this with patient. Will follow up on biopsies. [facility] to sign off, available as needed for questions, plan to follow up with patient for repeat EGD in outpatient setting.   Estefana Keas, DO [facility]

## 2024-06-26 NOTE — Interval H&P Note (Signed)
 History and Physical Interval Note:  06/26/2024 10:01 AM  Michele Lucas  has presented today for surgery, with the diagnosis of Abnormal esophagram, unintentional weight loss.  The various methods of treatment have been discussed with the patient and family. After consideration of risks, benefits and other options for treatment, the patient has consented to  Procedure(s): EGD (ESOPHAGOGASTRODUODENOSCOPY) (N/A) as a surgical intervention.  The patient's history has been reviewed, patient examined, no change in status, stable for surgery.  I have reviewed the patient's chart and labs.  Questions were answered to the patient's satisfaction.     [name]

## 2024-06-26 NOTE — Plan of Care (Addendum)
 EGD completed today, pt c/o 10/10 generalized/abd pain, full liquid diet continued. Pt tolerated diet well. Prn pain medication given, prn oxycodone  increased from 5mg  to 10mg  with more relief. Fall and safety precautions maintained.   Problem: Education: Goal: Knowledge of General Education information will improve Description: Including pain rating scale, medication(s)/side effects and non-pharmacologic comfort measures Outcome: Progressing   Problem: Health Behavior/Discharge Planning: Goal: Ability to manage health-related needs will improve Outcome: Progressing   Problem: Clinical Measurements: Goal: Ability to maintain clinical measurements within normal limits will improve Outcome: Progressing Goal: Will remain free from infection Outcome: Progressing Goal: Diagnostic test results will improve Outcome: Progressing Goal: Respiratory complications will improve Outcome: Progressing Goal: Cardiovascular complication will be avoided Outcome: Progressing   Problem: Activity: Goal: Risk for activity intolerance will decrease Outcome: Progressing   Problem: Nutrition: Goal: Adequate nutrition will be maintained Outcome: Progressing   Problem: Coping: Goal: Level of anxiety will decrease Outcome: Progressing   Problem: Elimination: Goal: Will not experience complications related to bowel motility Outcome: Progressing Goal: Will not experience complications related to urinary retention Outcome: Progressing   Problem: Pain Managment: Goal: General experience of comfort will improve and/or be controlled Outcome: Progressing   Problem: Safety: Goal: Ability to remain free from injury will improve Outcome: Progressing   Problem: Skin Integrity: Goal: Risk for impaired skin integrity will decrease Outcome: Progressing

## 2024-06-26 NOTE — Consult Note (Signed)
 The client was visited this am and was noted that she was having and EGD, will continue to try and complete the assessment that was interrupted yesterday.  Sharlot Becker, PMHNP

## 2024-06-26 NOTE — TOC Progression Note (Signed)
 Transition of Care [facility]) - Progression Note    Patient Details  Name: Michele Lucas MRN: [id number] Date of Birth: 03/27/1966  [facility]) CM/SW Contact  Lorraine LILLETTE Fenton, LCSW Phone Number: 06/26/2024, 9:44 AM  Clinical Narrative:     CSW went to meet with pt to offer HHPT as recommended, pt out of room for test.  TOC will follow up when pt available.     Barriers to Discharge: Continued Medical Work up  Expected Discharge Plan and Services                                               Social Determinants of Health (SDOH) Interventions SDOH Screenings   Food Insecurity: No Food Insecurity (06/24/2024)  Housing: Low Risk  (06/24/2024)  Transportation Needs: No Transportation Needs (06/24/2024)  Utilities: Not At Risk (06/24/2024)  Tobacco Use: High Risk (06/26/2024)    Readmission Risk Interventions    05/27/2022    4:29 PM 05/27/2022    4:26 PM  Readmission Risk Prevention Plan  Transportation Screening Complete Complete  PCP or Specialist Appt within 3-5 Days Complete Complete  HRI or Home Care Consult Complete Complete  Social Work Consult for Recovery Care Planning/Counseling Complete Complete  Palliative Care Screening Complete Complete  Medication Review Oceanographer) Complete Complete

## 2024-06-26 NOTE — Progress Notes (Signed)
 PROGRESS NOTE    Michele Lucas  FMW:[id number] DOB: Oct 05, 1966 DOA: 06/23/2024 PCP: Phyllistine Lenis, MD   Brief Narrative: 58 y.o. female with PMH significant for COPD, emphysema, no longer smoking, Raynaud's syndrome, fibromyalgia, chronic back pain while serving in the [employer], PTSD. 7/16, patient was brought to the ED by family with complaint of shortness of breath, generalized weakness, failure to thrive.   At the time of my evaluation, patient was in bed.  Alert, awake, oriented x 3 and able to give details of the history.  Very sad affect and tearful at times.  Family not at bedside Patient states since her husband died 5 years ago, she has been in grief, lost significant weight and never been able to 'come back to normal'. Over the years, she has lost about 88 pounds. Lives at home alone, daughter lives nearby. Patient complains of some dull aching epigastric pain all the time but denies any nausea, vomiting, diarrhea, difficulty swallowing or painful swallowing. Patient has fibromyalgia and is on oxycodone  3-4 times a day. Lately, she has also developed dyspnea more on exertion. She states she has been asking for some help at the [facility] but not been able to get seen.   With worsening symptoms, patient was brought to the ED.  In the ED, patient was afebrile, hemodynamically stable, not requiring supplemental oxygen Labs showed WC count 6.8, hemoglobin 13.4, MCV 103, sodium 144, renal functional normal, electrolytes normal enzymes normal, troponin normal Urinalysis showed hazy yellow color urine   CT angio chest without any evidence of pulm embolism or acute cardiopulmonary abnormality.   CT abdomen pelvis did not show any acute inflammatory process identified within the abdomen or pelvis. No bowel obstruction.   Admitted to [facility] Echocardiogram showed bicuspid aortic valve, severe AR, moderate AAS, mild to moderate MR  Assessment & Plan:   Principal Problem:   Dyspnea on  exertion Active Problems:   Major depressive disorder, recurrent episode, moderate (HCC)   Protein-calorie malnutrition, severe   Aortic valve regurgitation   Aortic valve stenosis   FTT (failure to thrive) in adult   Preop examination    Dyspnea on exertion Severe AI, moderate AS, mild to moderate MR Presented with dyspnea on exertion, failure to thrive, physical deconditioning  Echo findings as above showing severe AI, moderate AS, moderate MR Cardiology consulted. Recommended TEE bicuspid aortic valve, severe AR, moderate AS,EF 65 . Mild-moderate MR   cardiac MRI as outpatient  Continue to monitor   Depression/Grief -psych following  ??  Left pleural effusion Echocardiogram showed large left pleural effusion. Thoracentesis ordered but on ultrasound, no fluid pocket was noted.   Midsternal chest pain Significant weight loss Severe malnutrition On admission, patient reported constant midsternal and right-sided chest pain. Troponin negative, EKG unremarkable.  Likely noncardiac etiology of chest pain. Denies any dysphagia, odynophagia. Given significant epigastric tenderness, I have ordered esophagram. Dietitian consulted.  Started on Remeron  Patient is currently on soft diet.   Generalized weakness H/o fibromyalgia, PTSD Given history of fibromyalgia and persistence of symptoms since her husband died, I would suspect severe depression as the cause. Obtain TSH, cortisol, vitamin B12 and folic acid level. Psychiatry consultation was called.   Chronic back pain As needed oxycodone    Macrocytosis Normal hemoglobin level. Normal folic acid and vitamin B12 level   Nutrition Problem: Severe Malnutrition Etiology: chronic illness  Signs/Symptoms: severe fat depletion, severe muscle depletion  Interventions: Refer to RD note for recommendations, Boost Plus, MVI  Estimated body mass index  is 17.78 kg/m as calculated from the following:   Height as of this encounter: 5'  2 (1.575 m).   Weight as of this encounter: 44.1 kg.  DVT prophylaxis: scd Code Status: full Family Communication:none Disposition Plan:  Status is: Inpatient Consultants:  Psychiatry, cardiology GI   Procedures: egd 7/19 Antimicrobials: none  Subjective: Resting in bed has o2 on did not have it pta Egd 7/19 findings of esophageal stenosis severe. Balloon dilation performed to 9 mm with some mild, mucosal disruption. Recommend full liquid diet for now given severity of esophageal stenosis. Needs repeat EGD with [facility] in 4 weeks for repeat therapy   Objective: Vitals:   06/26/24 1053 06/26/24 1055 06/26/24 1100 06/26/24 1110  BP: (!) 149/48 (!) 149/48 (!) 122/50 (!) 129/49  Pulse: 63 61 74 70  Resp: 17 16 20 18   Temp:      TempSrc:      SpO2: 99% 100% 100% 99%  Weight:      Height:        Intake/Output Summary (Last 24 hours) at 06/26/2024 1304 Last data filed at 06/26/2024 1047 Gross per 24 hour  Intake 400 ml  Output --  Net 400 ml   Filed Weights   06/23/24 1723  Weight: 44.1 kg    Examination:  General exam: Appears IN NAD Respiratory system: Clear to auscultation. Respiratory effort normal. Cardiovascular system: S1 & S2 heard, RRR. No JVD, murmurs, rubs, gallops or clicks. No pedal edema. Gastrointestinal system: Abdomen is nondistended, soft and nontender. No organomegaly or masses felt. Normal bowel sounds heard. Central nervous system: Alert and oriented. No focal neurological deficits. Extremities: no edema   Data Reviewed: I have personally reviewed following labs and imaging studies  CBC: Recent Labs  Lab 06/23/24 1328 06/25/24 0953 06/26/24 0638  WBC 6.8 9.4 5.6  NEUTROABS 5.1  --  3.4  HGB 13.4 12.4 12.7  HCT 43.1 40.3 40.9  MCV 102.6* 104.9* 104.1*  PLT 127* 106* 111*   Basic Metabolic Panel: Recent Labs  Lab 06/23/24 1328 06/25/24 0953 06/26/24 0638  NA 144 139 140  K 3.9 4.1 4.0  CL 111 105 106  CO2 25 28 28   GLUCOSE 117*  87 84  BUN 13 17 17   CREATININE 0.50 0.37* 0.55  CALCIUM 9.8 9.3 8.7*  MG 2.1  --  2.0  PHOS 2.7  --   --    GFR: Estimated Creatinine Clearance: 52.7 mL/min (by C-G formula based on SCr of 0.55 mg/dL). Liver Function Tests: Recent Labs  Lab 06/23/24 1328  AST 18  ALT 11  ALKPHOS 45  BILITOT 1.0  PROT 6.7  ALBUMIN 3.8   No results for input(s): LIPASE, AMYLASE in the last 168 hours. No results for input(s): AMMONIA in the last 168 hours. Coagulation Profile: No results for input(s): INR, PROTIME in the last 168 hours. Cardiac Enzymes: No results for input(s): CKTOTAL, CKMB, CKMBINDEX, TROPONINI in the last 168 hours. BNP (last 3 results) No results for input(s): PROBNP in the last 8760 hours. HbA1C: No results for input(s): HGBA1C in the last 72 hours. CBG: No results for input(s): GLUCAP in the last 168 hours. Lipid Profile: No results for input(s): CHOL, HDL, LDLCALC, TRIG, CHOLHDL, LDLDIRECT in the last 72 hours. Thyroid Function Tests: Recent Labs    06/24/24 1348  TSH 0.361   Anemia Panel: Recent Labs    06/24/24 1349  VITAMINB12 345  FOLATE 15.8   Sepsis Labs: No results for input(s):  PROCALCITON, LATICACIDVEN in the last 168 hours.  No results found for this or any previous visit (from the past 240 hours).       Radiology Studies: DG ESOPHAGUS W SINGLE CM (SOL OR THIN BA) Result Date: 06/25/2024 CLINICAL DATA:  58 year old female with a history of Raynaud's syndrome, fibromyalgia, failure to thrive and significant weight loss over the past several years. Patient reports dull epigastric aching for the past week but denies any dysphagia, nausea, vomiting, or reflux. EXAM: ESOPHAGUS/BARIUM SWALLOW/TABLET STUDY TECHNIQUE: Single contrast examination was performed using thin liquid barium. This exam was performed by [facility] PA-C, and was supervised and interpreted by Dr. Newell Eke. FLUOROSCOPY: Radiation  Exposure Index (as provided by the fluoroscopic device): 4.6 mGy Kerma COMPARISON:  CT chest 06/24/2024. FINDINGS: Swallowing: Not formally assessed. Pharynx: Not formally assessed. Esophagus: Approximate 50% narrowing of the distal esophagus, through which a 13 mm barium tablet would not pass. Esophageal motility: Not assessed due to 45 degree recumbent position. Hiatal Hernia: None visualized. Gastroesophageal reflux: None visualized with coughing or Valsalva. Ingested 13mm barium tablet: Tablet became stuck in the distal esophagus and would not pass with additional water intake or thin barium. Other: Procedure performed at ~ 45 degree table tilt as patient is unable to lie flat. IMPRESSION: 1. Roughly 50% distal esophageal narrowing, through which a 13 mm barium tablet would not pass. 2. Otherwise limited exam due to patient condition. Electronically Signed   By: Newell Eke M.D.   On: 06/25/2024 14:43   US  CHEST (PLEURAL EFFUSION) Result Date: 06/25/2024 CLINICAL DATA:  Limited ultrasound of the chest done prior to possible thoracentesis. EXAM: CHEST ULTRASOUND COMPARISON:  CTA CHEST - 06/24/24 FINDINGS: No pleural effusions are identified by ultrasound. IMPRESSION: No pleural effusion.  No thoracentesis performed. Electronically Signed   By: Marcey Moan M.D.   On: 06/25/2024 13:19   ECHOCARDIOGRAM COMPLETE Result Date: 06/24/2024    ECHOCARDIOGRAM REPORT   Patient Name:   REBACCA VOTAW Date of Exam: 06/24/2024 Medical Rec #:  [id number]     Height:       62.0 in Accession #:    [id number]    Weight:       97.2 lb Date of Birth:  [birth year]      BSA:          1.407 m Patient Age:    57 years      BP:           119/71 mmHg Patient Gender: F             HR:           79 bpm. Exam Location:  Inpatient Procedure: 2D Echo, Cardiac Doppler and Color Doppler (Both Spectral and Color            Flow Doppler were utilized during procedure). Indications:    R07.9* Chest pain, unspecified; R06.02 SOB  History:         Patient has prior history of Echocardiogram examinations, most                 recent 05/24/2022. COPD, Aortic Valve Disease,                 Signs/Symptoms:Shortness of Breath and Dyspnea; Risk                 Factors:Current Smoker.  Sonographer:    Ellouise Mose RDCS Referring Phys: [id number] [facility] IMPRESSIONS  1. Suspect bicuspid aortic valve, heavily calcififed  and thickened, severe AR, AS likley more moderate. The aortic valve is calcified. Aortic valve regurgitation is severe. Moderate aortic valve stenosis. Aortic valve mean gradient measures 25.0 mmHg. Aortic valve Vmax measures 3.20 m/s.  2. Left ventricular ejection fraction, by estimation, is 65 to 70%. The left ventricle has normal function. The left ventricle has no regional wall motion abnormalities. Left ventricular diastolic parameters are consistent with Grade I diastolic dysfunction (impaired relaxation).  3. Right ventricular systolic function is normal. The right ventricular size is normal.  4. Large pleural effusion.  5. The mitral valve is degenerative. Mild to moderate mitral valve regurgitation. No evidence of mitral stenosis. FINDINGS  Left Ventricle: Left ventricular ejection fraction, by estimation, is 65 to 70%. The left ventricle has normal function. The left ventricle has no regional wall motion abnormalities. The left ventricular internal cavity size was normal in size. There is  no left ventricular hypertrophy. Left ventricular diastolic parameters are consistent with Grade I diastolic dysfunction (impaired relaxation). Right Ventricle: The right ventricular size is normal. No increase in right ventricular wall thickness. Right ventricular systolic function is normal. Left Atrium: Left atrial size was normal in size. Right Atrium: Right atrial size was normal in size. Pericardium: There is no evidence of pericardial effusion. Mitral Valve: The mitral valve is degenerative in appearance. Mild to moderate mitral valve  regurgitation. No evidence of mitral valve stenosis. Tricuspid Valve: The tricuspid valve is normal in structure. Tricuspid valve regurgitation is mild . No evidence of tricuspid stenosis. Aortic Valve: Suspect bicuspid aortic valve, heavily calcififed and thickened, severe AR, AS likley more moderate. The aortic valve is calcified. Aortic valve regurgitation is severe. Aortic regurgitation PHT measures 310 msec. Moderate aortic stenosis is present. Aortic valve mean gradient measures 25.0 mmHg. Aortic valve peak gradient measures 41.1 mmHg. Aortic valve area, by VTI measures 1.81 cm. Pulmonic Valve: The pulmonic valve was normal in structure. Pulmonic valve regurgitation is not visualized. No evidence of pulmonic stenosis. Aorta: The aortic root and ascending aorta are structurally normal, with no evidence of dilitation. IAS/Shunts: No atrial level shunt detected by color flow Doppler. Additional Comments: There is a large pleural effusion.  LEFT VENTRICLE PLAX 2D LVIDd:         3.70 cm     Diastology LVIDs:         2.40 cm     LV e' medial:    8.05 cm/s LV PW:         1.10 cm     LV E/e' medial:  10.5 LV IVS:        1.00 cm     LV e' lateral:   8.59 cm/s LVOT diam:     2.10 cm     LV E/e' lateral: 9.9 LV SV:         117 LV SV Index:   83 LVOT Area:     3.46 cm  LV Volumes (MOD) LV vol d, MOD A2C: 89.9 ml LV vol d, MOD A4C: 74.8 ml LV vol s, MOD A2C: 32.0 ml LV vol s, MOD A4C: 23.1 ml LV SV MOD A2C:     57.9 ml LV SV MOD A4C:     74.8 ml LV SV MOD BP:      58.3 ml IVC IVC diam: 2.30 cm LEFT ATRIUM           Index        RIGHT ATRIUM  Index LA diam:      2.10 cm 1.49 cm/m   RA Area:     12.10 cm LA Vol (A2C): 20.3 ml 14.43 ml/m  RA Volume:   29.60 ml  21.04 ml/m LA Vol (A4C): 11.8 ml 8.39 ml/m  AORTIC VALVE AV Area (Vmax):    1.96 cm AV Area (Vmean):   1.81 cm AV Area (VTI):     1.81 cm AV Vmax:           320.40 cm/s AV Vmean:          228.000 cm/s AV VTI:            0.650 m AV Peak Grad:       41.1 mmHg AV Mean Grad:      25.0 mmHg LVOT Vmax:         181.00 cm/s LVOT Vmean:        119.000 cm/s LVOT VTI:          0.339 m LVOT/AV VTI ratio: 0.52 AI PHT:            310 msec  AORTA Ao Root diam: 3.10 cm Ao Asc diam:  3.10 cm MITRAL VALVE               TRICUSPID VALVE MV Area (PHT): 3.37 cm    TR Peak grad:   25.2 mmHg MV Decel Time: 225 msec    TR Vmax:        251.00 cm/s MR Peak grad: 96.4 mmHg MR Mean grad: 52.0 mmHg    SHUNTS MR Vmax:      491.00 cm/s  Systemic VTI:  0.34 m MR Vmean:     327.0 cm/s   Systemic Diam: 2.10 cm MV E velocity: 84.80 cm/s MV A velocity: 73.70 cm/s MV E/A ratio:  1.15 Morene Brownie Electronically signed by Morene Brownie Signature Date/Time: 06/24/2024/5:54:18 PM    Final     Scheduled Meds:  fluticasone  furoate-vilanterol  1 puff Inhalation Daily   lactose free nutrition  237 mL Oral TID BM   mirtazapine   15 mg Oral QHS   multivitamin with minerals  1 tablet Oral Daily   pantoprazole   40 mg Oral Daily   umeclidinium bromide   1 puff Inhalation Daily   Continuous Infusions:   LOS: 1 day    Almarie KANDICE Hoots, MD 06/26/2024, 1:04 PM

## 2024-06-26 NOTE — Op Note (Signed)
 [facility] Patient Name: Michele Lucas Procedure Date: 06/26/2024 MRN: [id number] Attending MD: Estefana Keas DO, DO, [id number] Date of Birth: 1966-04-12 CSN: [id number] Age: 58 Admit Type: Inpatient Procedure:                Upper GI endoscopy Indications:              Abnormal cine-esophagram, Weight loss Providers:                Estefana Keas DO, DO, Almarie Goo LPN, LPN,                            Felice Sar, Technician Referring MD:              Medicines:                See the Anesthesia note for documentation of the                            administered medications Complications:            No immediate complications. Estimated Blood Loss:     Estimated blood loss was minimal. Procedure:                Pre-Anesthesia Assessment:                           - ASA Grade Assessment: II - A patient with mild                            systemic disease.                           - The risks and benefits of the procedure and the                            sedation options and risks were discussed with the                            patient. All questions were answered and informed                            consent was obtained.                           After obtaining informed consent, the endoscope was                            passed under direct vision. Throughout the                            procedure, the patient's blood pressure, pulse, and                            oxygen saturations were monitored continuously. The                            [id number]-[id number] ([id number]) Olympus endoscope was  introduced                            through the mouth, and advanced to the second part                            of duodenum. The upper GI endoscopy was                            accomplished without difficulty. The patient                            tolerated the procedure well. Scope In: Scope Out: Findings:      One benign-appearing, intrinsic severe  (stenosis; an endoscope cannot       pass) stenosis was found 40 cm from the incisors. This stenosis measured       8 mm (inner diameter) x less than one cm (in length). The stenosis was       traversed after downsizing scope. A TTS dilator was passed through the       scope. Dilation with an 07-17-09 mm balloon dilator was performed to 9 mm.       The dilation site was examined and showed mild mucosal disruption.       Estimated blood loss was minimal. Biopsies were taken with a cold       forceps for histology.      Scattered mild inflammation characterized by congestion (edema) and       erythema was found in the gastric body and in the gastric antrum.       Biopsies were taken with a cold forceps for Helicobacter pylori testing.      The examined duodenum was normal. Impression:               - Benign-appearing esophageal stenosis. Dilated.                            Biopsied.                           - Gastritis. Biopsied.                           - Normal examined duodenum. Moderate Sedation:      See the other procedure note for documentation of moderate sedation with       intraservice time. Recommendation:           - Return patient to hospital ward for ongoing care.                           - Full liquid diet for 2 weeks.                           - Continue present medications.                           - Await pathology results.                           -  Repeat upper endoscopy in 4 weeks for retreatment. Procedure Code(s):        --- Professional ---                           [phone], Esophagogastroduodenoscopy, flexible,                            transoral; with transendoscopic balloon dilation of                            esophagus (less than 30 mm diameter) Diagnosis Code(s):        --- Professional ---                           K22.2, Esophageal obstruction                           K29.70, Gastritis, unspecified, without bleeding                           R63.4,  Abnormal weight loss                           R93.3, Abnormal findings on diagnostic imaging of                            other parts of digestive tract CPT copyright 2022 [employer]. All rights reserved. The codes documented in this report are preliminary and upon coder review may  be revised to meet current compliance requirements. Dr Estefana Keas, DO Estefana Keas DO, DO 06/26/2024 10:53:01 AM Number of Addenda: 0

## 2024-06-26 NOTE — Transfer of Care (Signed)
 Immediate Anesthesia Transfer of Care Note  Patient: Michele Lucas  Procedure(s) Performed: EGD (ESOPHAGOGASTRODUODENOSCOPY) BIOPSY, GI DILATION, ESOPHAGUS  Patient Location: PACU  Anesthesia Type:MAC  Level of Consciousness: drowsy and patient cooperative  Airway & Oxygen Therapy: Patient Spontanous Breathing and Patient connected to nasal cannula oxygen  Post-op Assessment: Report given to RN and Post -op Vital signs reviewed and stable  Post vital signs: Reviewed and stable  Last Vitals:  Vitals Value Taken Time  BP 97/43 06/26/24 10:49  Temp    Pulse 76 06/26/24 10:50  Resp 18 06/26/24 10:50  SpO2 98 % 06/26/24 10:50  Vitals shown include unfiled device data.  Last Pain:  Vitals:   06/26/24 0937  TempSrc: Temporal  PainSc: 10-Worst pain ever      Patients Stated Pain Goal: 0 (06/25/24 0800)  Complications: No notable events documented.

## 2024-06-27 DIAGNOSIS — F331 Major depressive disorder, recurrent, moderate: Secondary | ICD-10-CM

## 2024-06-27 DIAGNOSIS — F411 Generalized anxiety disorder: Secondary | ICD-10-CM

## 2024-06-27 DIAGNOSIS — R0609 Other forms of dyspnea: Secondary | ICD-10-CM | POA: Diagnosis not present

## 2024-06-27 DIAGNOSIS — I062 Rheumatic aortic stenosis with insufficiency: Secondary | ICD-10-CM

## 2024-06-27 DIAGNOSIS — I7300 Raynaud's syndrome without gangrene: Secondary | ICD-10-CM

## 2024-06-27 MED ORDER — MIRTAZAPINE 15 MG PO TBDP: 15.0000 mg | ORAL_TABLET | Freq: Every day | ORAL | 0 refills | Status: AC

## 2024-06-27 MED ORDER — HYDROXYZINE HCL 25 MG PO TABS: 25.0000 mg | ORAL_TABLET | Freq: Three times a day (TID) | ORAL | Status: DC | PRN

## 2024-06-27 NOTE — Progress Notes (Signed)
 Patient Saturations on Room Air at Rest = 96%  Patient Saturations on Room Air while Ambulating = 92%  Patient Saturations on 2 Liters of oxygen while Ambulating = 97% :

## 2024-06-27 NOTE — Progress Notes (Signed)
 Message sent to office staff to arrange outpatient cardiac MRI for AI evaluation and follow up with Dr Kate.

## 2024-06-27 NOTE — Consult Note (Signed)
 [facility] Psychiatric Consult Follow Up  Patient Name: .Michele Lucas  MRN: [id number]  DOB: Jun 14, 1966  Consult Order details:  Orders (From admission, onward)     Start     Ordered   06/24/24 1317  IP CONSULT TO PSYCHIATRY       Ordering Provider: Arlice Reichert, MD  Provider:  (Not yet assigned)  Question Answer Comment  Location [facility]   Reason for Consult? severe depression significantly affecting the quality of life unable to get any help from [facility]      06/24/24 1317             Mode of Visit: In person    Psychiatry Consult Evaluation  Service Date: June 27, 2024 LOS:  LOS: 2 days  Chief Complaint I'm feeling a little better.  Primary Psychiatric Diagnoses  MDD, moderate 2.  GAD   Assessment  Michele Lucas is a 58 y.o. female admitted: Medicallyfor 06/23/2024 11:57 AM for PMH significant for COPD, emphysema, no longer smoking, Raynaud's syndrome, fibromyalgia, chronic back pain while serving in the [employer], PTSD.  7/16, patient was brought to the ED by family with complaint of shortness of breath, generalized weakness, failure to thrive. At the time of my evaluation, patient was in bed.  Alert, awake, oriented x 3 and able to give details of the history.  Very sad affect and tearful at times.  Family not at bedside.  Patient states since her husband died 5 years ago, she has been in grief, lost significant weight and never been able to 'come back normal'. Over the years, she has lost about 88 pounds. Lives at home alone, daughter lives nearby, per Dr. Reichert on admission.  Her current presentation of dysphoric mood, low motivation and energy, and an occasional, passive suicidal thought is most consistent with MDD. Current outpatient psychotropic medications include none.  On initial examination, patient was vomiting with the nurse in the process of giving medication.  Visited again and the assessment got interrupted due to her needing to leave  for a swallow test, will continue follow up tomorrow. Please see plan below for detailed recommendations.   Diagnoses:  Active Hospital problems: Principal Problem:   Dyspnea on exertion Active Problems:   Major depressive disorder, recurrent episode, moderate (HCC)   Protein-calorie malnutrition, severe   Aortic valve regurgitation   Aortic valve stenosis   FTT (failure to thrive) in adult   Preop examination    Plan   ## Psychiatric Medication Recommendations:  Continue hydroxyzine  PRN Restart Remeron  15 mg at bedtime for sleep and appetite which has been helpful in the distant past. Declined therapy, recommended ACT at the [facility] where she receives her care.  ## Medical Decision Making Capacity: Not specifically addressed in this encounter  ## Further Work-up:  -- most recent EKG on 06/25/2027 had QtC of 443 -- Pertinent labwork reviewed earlier this admission includes: CBC, chem panel, U/A, and EKG  ## Disposition:-- There are no psychiatric contraindications to discharge at this time  ## Behavioral / Environmental: - No specific recommendations at this time.    ## Safety and Observation Level:  - Based on my clinical evaluation, I estimate the patient to be at low risk of self harm in the current setting. - At this time, we recommend  routine. This decision is based on my review of the chart including patient's history and current presentation, interview of the patient, mental status examination, and consideration of suicide risk including evaluating suicidal  ideation, plan, intent, suicidal or self-harm behaviors, risk factors, and protective factors. This judgment is based on our ability to directly address suicide risk, implement suicide prevention strategies, and develop a safety plan while the patient is in the clinical setting. Please contact our team if there is a concern that risk level has changed.  CSSR Risk Category:C-SSRS RISK CATEGORY: No Risk  Suicide Risk  Assessment: Patient has following modifiable risk factors for suicide: untreated depression and current symptoms: anxiety/panic, insomnia, impulsivity, anhedonia, hopelessness, which we are addressing by recommending medications and therapy when a full assessment can be made tomorrow. Patient has following non-modifiable or demographic risk factors for suicide: none Patient has the following protective factors against suicide: Supportive family, no history of suicide attempts, and no history of NSSIB  Thank you for this consult request. Recommendations have been communicated to the primary team.  We will signoff at this time.   Michele Becker, NP       History of Present Illness  Relevant Aspects of [facility] Course:  Admitted on 06/23/2024 for PMH significant for COPD, emphysema, no longer smoking, Raynaud's syndrome, fibromyalgia, chronic back pain while serving in the [employer], PTSD.  7/16, patient was brought to the ED by family with complaint of shortness of breath, generalized weakness, failure to thrive. At the time of my evaluation, patient was in bed.  Alert, awake, oriented x 3 and able to give details of the history.  Very sad affect and tearful at times.  Family not at bedside.  Patient states since her husband died 5 years ago, she has been in grief, lost significant weight and never been able to 'come back normal'. Over the years, she has lost about 88 pounds. Lives at home alone, daughter lives nearby, per Dr. Chapman on admission.  Patient Report:  Patient was initially vomiting on the first visit.  The second visit she was sitting in a recliner awaiting a swallow study.  She reported her symptoms started after her husband passed in 2020 after she took care of him since his illness began in 2000.  I tried to bounce back but just not there.  Now, she lives alone with low motivation and energy, N/V with significant weight loss, low appetite, and dysphoric mood affecting her  functioning.  Anxiety has always been high with constant worry that she cannot control, restless feelings with panic attacks every couple of days.  No hallucinations or paranoia.  Trauma with all that referring to symptoms of flashbacks and nightmares.  Discussed care at the [facility] and she stated they did not help her.  When the [facility] and other outpatient clinics discussed there at her [facility] location (this provider worked there), she said, I didn't have time for that, I was taking care of my husband.  The assessment was cut short due to transport arrival to take her to her swallow study, will follow up tomorrow.  06/27/2024: The client reported feeling a little better today and had questions about her esophageal stenosis which this provider went over the before and after pictures on her paperwork with her.  Encouraged her to also discuss with her MD that rounded today.  Her depression and anxiety has been going on for years.  Discussed therapy and she did not want to go back there again, r/t traumas.  This provider let her know about ACT therapy that is offered at the [facility] as she worked there.  ACT therapy is about accepting and moving forward without dredging  up the past.  She may consider it in the future.  Denied suicidal ideations, past suicide attempts, and psychiatric hospitalizations.  Her appetite is typically good except prior to admission and concerned that she continues to not gain weight.  Discussed strategies of smoothies with her supplemental shakes that she takes from the [facility].  One of her daughters is now taking over my care at the [facility] and had also mentioned this technique for her to gain weight.    She did report the hydroxyzine  helps her anxiety and prevents panic attacks.  Her sleep is bad and always has been.  When discussed medications, she did think the mirtazapine  was helpful and agreeable to restart it.    This month and next are always more difficult for her as her husband  passed 5 years ago on 7/15 with her birthday and wedding anniversary on 8/8.  Encouraged grief therapy in the community are there many through churches.  She is planning on getting out of her house more as she lives alone with two daughters and 6 grand children that live near her.  Discussed this would be especially helpful in the next few weeks with anniversary dates coming up.  Psych ROS:  Depression: moderate Anxiety:  moderate Mania (lifetime and current): none Psychosis: (lifetime and current): none  Review of Systems  Constitutional:  Positive for malaise/fatigue.  HENT: Negative.    Eyes: Negative.   Respiratory: Negative.    Cardiovascular: Negative.   Gastrointestinal: Negative.   Genitourinary: Negative.   Musculoskeletal: Negative.   Skin: Negative.   Neurological: Negative.   Endo/Heme/Allergies: Negative.   Psychiatric/Behavioral:  Positive for depression. The patient is nervous/anxious.      Psychiatric and Social History  Psychiatric History:  Information collected from patient and chart.  Prev Dx/Sx: PTSD, anxiety Current Psych Provider: none Home Meds (current): no psych medications Previous Med Trials: mirtazapine  Therapy: none  Prior Psych Hospitalization: none  Prior Self Harm: none Prior Violence: none  Family Psych History: none Family Hx suicide: none  Social History:  Occupational Hx: past Doctor, hospital Hx: none Living Situation: lives alone  Access to weapons/lethal means: none   Substance History Denies substance abuse  Exam Findings  Physical Exam: completed by the MD, reviewed by this practitioner. Vital Signs:  Temp:  [97.8 F (36.6 C)-98.2 F (36.8 C)] 97.9 F (36.6 C) (07/20 0458) Pulse Rate:  [61-93] 92 (07/20 0458) Resp:  [13-20] 14 (07/20 0458) BP: (93-149)/(43-68) 93/68 (07/20 0458) SpO2:  [95 %-100 %] 97 % (07/20 0458) Blood pressure 93/68, pulse 92, temperature 97.9 F (36.6 C), temperature source Oral, resp.  rate 14, height 5' 2 (1.575 m), weight 44.1 kg, SpO2 97%. Body mass index is 17.78 kg/m.  Physical Exam  Mental Status Exam: General Appearance: Casual  Orientation:  Full (Time, Place, and Person)  Memory:  Good  Concentration:  Good  Recall:  Good  Attention  Good  Eye Contact:  Good  Speech:  Clear and Coherent  Language:  Good  Volume:  Normal  Mood: anxious and depressed  Affect:  Congruent  Thought Process:  Coherent  Thought Content:  Logical  Suicidal Thoughts:  No  Homicidal Thoughts:  No  Judgement:  Good  Insight:  Fair  Psychomotor Activity:  Decreased  Akathisia:  No  Fund of Knowledge:  Good      Assets:  Housing Leisure Time Resilience Social Support  Cognition:  WNL  ADL's:  Intact  AIMS (if indicated):  Other History   These have been pulled in through the EMR, reviewed, and updated if appropriate.  Family History:  The patient's family history includes Diabetes type II in an other family member.  Medical History: Past Medical History:  Diagnosis Date   Anxiety    Chronic back pain    COPD, mild (HCC)    Emphysema lung (HCC)    Endometriosis    Fibromyalgia    Grade I diastolic dysfunction 11/26/2021   Kidney stones    Neuropathic pain    PTSD (post-traumatic stress disorder)     Surgical History: Past Surgical History:  Procedure Laterality Date   BONE BIOPSY  06/26/2024   Procedure: BIOPSY, GI;  Surgeon: Kriss Estefana DEL, DO;  Location: [facility];  Service: Gastroenterology;;   CESAREAN SECTION     copolscopy     ESOPHAGEAL DILATION  06/26/2024   Procedure: DILATION, ESOPHAGUS;  Surgeon: Kriss Estefana DEL, DO;  Location: [facility];  Service: Gastroenterology;;   ESOPHAGOGASTRODUODENOSCOPY N/A 06/26/2024   Procedure: EGD (ESOPHAGOGASTRODUODENOSCOPY);  Surgeon: Kriss Estefana DEL, DO;  Location: [facility];  Service: Gastroenterology;  Laterality: N/A;   TONSILLECTOMY     TUBAL LIGATION       Medications:    Current Facility-Administered Medications:    albuterol  (VENTOLIN  HFA) 108 (90 Base) MCG/ACT inhaler 2 puff, 2 puff, Inhalation, 5 X Daily PRN, Kriss Estefana DEL, DO, 2 puff at 06/25/24 9360   bisacodyl  (DULCOLAX) EC tablet 5 mg, 5 mg, Oral, Daily PRN, Kriss Estefana DEL, DO   fluticasone  furoate-vilanterol (BREO ELLIPTA ) 200-25 MCG/ACT 1 puff, 1 puff, Inhalation, Daily, Kriss Estefana DEL, DO, 1 puff at 06/26/24 0834   hydrALAZINE  (APRESOLINE ) injection 10 mg, 10 mg, Intravenous, Q6H PRN, Kriss Estefana DEL, DO   lactose free nutrition (BOOST PLUS) liquid 237 mL, 237 mL, Oral, TID BM, Vreeland, Claire H, DO, 237 mL at 06/26/24 2020   melatonin tablet 5 mg, 5 mg, Oral, QHS PRN, Vreeland, Claire H, DO, 5 mg at 06/25/24 2130   methocarbamol  (ROBAXIN ) tablet 500 mg, 500 mg, Oral, Q6H PRN, Kriss Estefana DEL, DO, 500 mg at 06/27/24 9746   mirtazapine  (REMERON  SOL-TAB) disintegrating tablet 15 mg, 15 mg, Oral, QHS, Kriss Estefana DEL, DO, 15 mg at 06/26/24 2115   multivitamin with minerals tablet 1 tablet, 1 tablet, Oral, Daily, Vreeland, Claire H, DO   oxyCODONE  (Oxy IR/ROXICODONE ) immediate release tablet 10 mg, 10 mg, Oral, Q6H PRN, Will Almarie MATSU, MD, 10 mg at 06/27/24 0253   pantoprazole  (PROTONIX ) EC tablet 40 mg, 40 mg, Oral, Daily, Vreeland, Claire H, DO, 40 mg at 06/25/24 9171   polyethylene glycol (MIRALAX  / GLYCOLAX ) packet 17 g, 17 g, Oral, Daily PRN, Kriss Estefana H, DO   umeclidinium bromide  (INCRUSE ELLIPTA ) 62.5 MCG/ACT 1 puff, 1 puff, Inhalation, Daily, Kriss Estefana DEL, DO, 1 puff at 06/26/24 9165  Allergies: Allergies  Allergen Reactions   Guaifenesin & Derivatives Shortness Of Breath and Other (See Comments)    Mucinex brand   Pork-Derived Products Other (See Comments)    Patient prefers to not eat any pork/swine    Robafen Dm [Guaifenesin-Dm] Shortness Of Breath   Cymbalta [Duloxetine Hcl] Nausea And Vomiting   Ibuprofen  Other (See Comments)    Upset  stomach- abdominal pain   Neurontin [Gabapentin] Other (See Comments)    Reported by [facility] - slowed heart rate, -per pt   Prozac [Fluoxetine Hcl] Other (See Comments)    Loss of appetite, sedation  Tylenol  [Acetaminophen ] Other (See Comments)    Upset stomach   Ampicillin Hives, Nausea Only, Rash and Other (See Comments)    Severe nausea and WELTS   Clindamycin /Lincomycin Swelling, Rash and Other (See Comments)    Had these reactions in feet, ankles and legs.   Penicillins Hives, Rash and Other (See Comments)    And WELTS    Michele Becker, NP

## 2024-06-27 NOTE — Progress Notes (Signed)
 RT attempted to complete scheduled treatment- PT is currently working with Physical Therapy.

## 2024-06-27 NOTE — TOC Progression Note (Signed)
 Transition of Care [facility]) - Progression Note    Patient Details  Name: Michele Lucas MRN: [id number] Date of Birth: 09-26-1966  [facility] CM/SW Contact  Lorraine LILLETTE Fenton, LCSW Phone Number: 06/27/2024, 11:45 AM  Clinical Narrative:    CSW met with pt at bedside, daughter also present.  Discussed HHPT recommendation, pt agreeable. Daughter states they prefer [facility], CSW agreed to refer. CSW also offered condolences as pt is a widow and record indicates grief struggle, recommended grief counseling or motivational counseling to help her in her present state. Mentioned providers for counseling and how daughter can research even virtual service.  CSW contacted Joane with [facility]-accepted referral, added to AVS, then emailed [facility] referral to designated address on form.  No other TOC needs.     Barriers to Discharge: No Barriers Identified  Expected Discharge Plan and Services         Expected Discharge Date: 06/27/24                                     Social Determinants of Health (SDOH) Interventions SDOH Screenings   Food Insecurity: No Food Insecurity (06/24/2024)  Housing: Low Risk  (06/24/2024)  Transportation Needs: No Transportation Needs (06/24/2024)  Utilities: Not At Risk (06/24/2024)  Tobacco Use: High Risk (06/26/2024)    Readmission Risk Interventions    05/27/2022    4:29 PM 05/27/2022    4:26 PM  Readmission Risk Prevention Plan  Transportation Screening Complete Complete  PCP or Specialist Appt within 3-5 Days Complete Complete  HRI or Home Care Consult Complete Complete  Social Work Consult for Recovery Care Planning/Counseling Complete Complete  Palliative Care Screening Complete Complete  Medication Review Oceanographer) Complete Complete

## 2024-06-27 NOTE — Progress Notes (Signed)
  Progress Note  Patient Name: Michele Lucas Date of Encounter: 06/27/2024 [facility] Cardiologist: None   Interval Summary   No cardiovascular complaints. EGD yesterday showed benign esophageal stenosis which was dilated and gastritis.  Vital Signs Vitals:   06/26/24 1110 06/26/24 1315 06/26/24 2102 06/27/24 0458  BP: (!) 129/49 100/66 102/63 93/68  Pulse: 70 89 93 92  Resp: 18 16 16 14   Temp:  98 F (36.7 C) 97.9 F (36.6 C) 97.9 F (36.6 C)  TempSrc:  Oral Oral Oral  SpO2: 99% 100% 100% 97%  Weight:      Height:        Intake/Output Summary (Last 24 hours) at 06/27/2024 0952 Last data filed at 06/26/2024 1300 Gross per 24 hour  Intake 640 ml  Output --  Net 640 ml      06/23/2024    5:23 PM 11/01/2023    9:32 AM 11/01/2023    9:04 AM  Last 3 Weights  Weight (lbs) 97 lb 3.6 oz 97 lb 3.6 oz 97 lb 3.6 oz  Weight (kg) 44.1 kg 44.1 kg 44.1 kg      Telemetry/ECG  Normal sinus rhythm- Personally Reviewed  I reviewed her echocardiogram.  I actually think the aortic valve is probably trileaflet, when reviewing the images obtained from the subcostal views.  Both the aortic valve and the mitral valve commissures are unusually thick and partially calcified, more suggestive of postinflammatory changes (rheumatic heart disease for example).  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  I agree with Dr. Kate that the aortic insufficiency is not severe based on the echocardiographic findings.  Physical Exam  GEN: No acute distress.   Neck: No JVD Cardiac: RRR, 3/6 early peaking aortic ejection murmur and 2/6 diastolic decrescendo murmur at the left lower sternal border, no apical murmurs, rubs, or gallops.  Respiratory: Clear to auscultation bilaterally. GI: Soft, nontender, non-distended  MS: No edema  Assessment & Plan  Check screening immunological  studies for Libman-Sacks endocarditis (ANA, lupus anticoagulant, anticardiolipin antibodies). Outpatient MRI as suggested in Dr. Alvan note will clarify the anatomy of the aortic valve and quantitate severity of the aortic insufficiency. There is no evidence of left ventricular dilation or dysfunction. The aortic valve disease should be monitored with yearly echo but does not require intervention at this time. Eventually she will require aortic valve replacement, probably when the aortic stenosis becomes severe.  At her age this would typically be a surgical procedure, but in view of her comorbid conditions and poor nutritional status, TAVR may be considered. Blood pressure does not allow vasodilators for AI. [facility] will sign off.   The patient is ready for discharge today from a cardiac standpoint. Medication Recommendations: Per primary team Other recommendations (labs, testing, etc): Outpatient cardiac MRI, we will schedule.  Repeat echocardiogram in 1 year Follow up as an outpatient: Will schedule follow-up after cardiac MRI. For questions or updates, please contact [facility] Please consult [email or web address] for contact info under       Signed, Jerel Balding, MD

## 2024-06-27 NOTE — Progress Notes (Signed)
 Second attempt to deliver scheduled respiratory treatment- PT is currently in the shower.

## 2024-06-27 NOTE — Discharge Summary (Signed)
 Physician Discharge Summary  Michele Lucas FMW:[id number] DOB: Jun 04, 1966 DOA: 06/23/2024  PCP: Phyllistine Lenis, MD  Admit date: 06/23/2024 Discharge date: 06/27/2024  Admitted From: Home Disposition: Home  Recommendations for Outpatient Follow-up:  Follow up with PCP in 1-2 weeks Follow-up with [facility] gastroenterology for repeat endoscopy 4 weeks for esophageal stricture Follow-up with cardiology outpatient for outpatient cardiac MRI and further monitoring of severe AI, moderate AS Repeat echocardiogram 1 year Please follow up on the following pending results: Biopsy results, H. pylori testing pending from EGD at time of discharge  Home Health: PT Equipment/Devices: None  Discharge Condition: Stable CODE STATUS: Full code Diet recommendation: Full liquid diet x 2 weeks per GI then can transition to soft diet  History of present illness:  Michele Lucas is a 58 year old female with past medical history significant for COPD/emphysema, Raynaud's syndrome, fibromyalgia, chronic back pain, PTSD, history of tobacco abuse in remission who presented to [facility] ED on 06/23/2024 with shortness of breath, generalized weakness, epigastric pain.  Patient endorses dyspnea more on exertion.  Endorses roughly 88 pound weight loss over several years, reports her husband died approximately 5 years ago and has been in grief.  Denies nausea, no vomiting, no diarrhea, no difficulty swallowing or painful swallowing.  In the ED, temperature 90.2 F, HR 108, RR 16, BP 105/80, SpO2 95% on room air.  WBC 6.8, hemoglobin 13.4, platelet count 127.  Sodium 144, potassium 3.9, chloride 111, CO2 25, glucose 117, BUN 13, creatinine 0.50.  AST 18, ALT 11, total bilirubin 1.0.  Urinalysis unrevealing.  Troponin within normal limits.  CT abdomen/pelvis with contrast with no acute inflammatory process, no bowel obstruction.  CT angiogram chest negative for pulmonary embolism.  TRH was consulted for admission for further evaluation  and management of epigastric pain, dyspnea on exertion.  Hospital course:  Dyspnea on exertion secondary to severe aortic insufficiency, moderate aortic stenosis Patient presenting with shortness of breath on exertion.  Patient is afebrile without leukocytosis with appropriate oxygenation on room air.  CT chest angiogram negative for pulmonary embolism.  TTE with LVEF 65 to 70%, suspected bicuspid aortic valve, severe AI, moderate AAS, RV systolic function normal, mild/moderate mitral regurgitation.  Seen by cardiology with recommendation of outpatient cardiac MRI for AI evaluation.  Aortic valve disease to be monitored yearly by echo but does not require intervention at this time per cardiology.  Outpatient follow-up with cardiology, Dr. Kate.   Esophageal stricture s/p dilation Patient underwent EGD by Dr. Kriss 06/26/2024 with findings of 1 benign-appearing intrinsic severe stenosis found 40 cm from the incisors s/p dilation and biopsy; scattered mild inflammation/erythema gastric body and gastric antrum s/p biopsies for H. pylori testing.  GI recommends full liquid diet x 2 weeks.  Pathology results pending at time of discharge.  Will need repeat upper endoscopy 4 weeks for retreatment.  Outpatient follow-up with GI.  COPD/emphysema Continue home fluticasone -salmeterol inhaler 1 puff twice daily, Spiriva  Respimat 2 puffs daily.  Amatory to screen at time of discharge with no desaturation below 92% with exertion.  Outpatient follow-up with pulmonology.  Chronic back pain/fibromyalgia Continue oxycodone .  Outpatient follow-up with PCP.  PTSD Continue hydroxyzine ; started on Remeron  by psychiatry.  Outpatient follow-up with psychiatry at the [facility].  History of tobacco abuse in remission Continue to encourage abstinence.  Discharge Diagnoses:  Principal Problem:   Dyspnea on exertion Active Problems:   Major depressive disorder, recurrent episode, moderate (HCC)   Protein-calorie  malnutrition, severe   Aortic valve regurgitation  Aortic valve stenosis   FTT (failure to thrive) in adult   Preop examination   Aortic valve stenosis and insufficiency, rheumatic   Raynaud's disease without gangrene    Discharge Instructions  Discharge Instructions     Call MD for:  difficulty breathing, headache or visual disturbances   Complete by: As directed    Call MD for:  extreme fatigue   Complete by: As directed    Call MD for:  persistant dizziness or light-headedness   Complete by: As directed    Call MD for:  persistant nausea and vomiting   Complete by: As directed    Call MD for:  severe uncontrolled pain   Complete by: As directed    Call MD for:  temperature >100.4   Complete by: As directed    Diet full liquid   Complete by: As directed    For 2 weeks per gastroenterology; then can transition to a soft diet   Increase activity slowly   Complete by: As directed       Allergies as of 06/27/2024       Reactions   Guaifenesin & Derivatives Shortness Of Breath, Other (See Comments)   Mucinex brand   Pork-derived Products Other (See Comments)   Patient prefers to not eat any pork/swine   Robafen Dm [guaifenesin-dm] Shortness Of Breath   Cymbalta [duloxetine Hcl] Nausea And Vomiting   Ibuprofen  Other (See Comments)   Upset stomach- abdominal pain   Neurontin [gabapentin] Other (See Comments)   Reported by [facility] - slowed heart rate, -per pt   Prozac [fluoxetine Hcl] Other (See Comments)   Loss of appetite, sedation   Tylenol  [acetaminophen ] Other (See Comments)   Upset stomach   Ampicillin Hives, Nausea Only, Rash, Other (See Comments)   Severe nausea and WELTS   Clindamycin /lincomycin Swelling, Rash, Other (See Comments)   Had these reactions in feet, ankles and legs.   Penicillins Hives, Rash, Other (See Comments)   And WELTS        Medication List     STOP taking these medications    azithromycin  250 MG  tablet Commonly known as: Zithromax  Z-Pak   multivitamin with minerals Tabs tablet   pantoprazole  40 MG tablet Commonly known as: Protonix        TAKE these medications    albuterol  108 (90 Base) MCG/ACT inhaler Commonly known as: VENTOLIN  HFA Inhale 2 puffs into the lungs 5 (five) times daily.   albuterol  (2.5 MG/3ML) 0.083% nebulizer solution Commonly known as: PROVENTIL  Take 3 mLs (2.5 mg total) by nebulization every 6 (six) hours as needed for wheezing or shortness of breath.   benzonatate  200 MG capsule Commonly known as: TESSALON  Take 1 capsule (200 mg total) by mouth 3 (three) times daily as needed for cough.   hydrOXYzine  25 MG tablet Commonly known as: ATARAX  Take 25 mg by mouth in the morning, at noon, in the evening, and at bedtime.   lactose free nutrition Liqd Take 237 mLs by mouth 3 (three) times daily with meals. What changed:  when to take this additional instructions   loperamide  2 MG capsule Commonly known as: IMODIUM  Take 2 mg by mouth as needed for diarrhea or loose stools.   oxyCODONE  5 MG immediate release tablet Commonly known as: Oxy IR/ROXICODONE  Take 1 tablet (5 mg total) by mouth 3 (three) times daily as needed for severe pain. What changed: when to take this   predniSONE  10 MG tablet Commonly known as:  DELTASONE  Take 20 mg by mouth See admin instructions. Take 20 mg by mouth with food once a day as directed for COPD flares What changed: Another medication with the same name was removed. Continue taking this medication, and follow the directions you see here.   Spiriva  Respimat 1.25 MCG/ACT Aers Generic drug: Tiotropium Bromide  Monohydrate Inhale 2 puffs into the lungs in the morning.   Wixela Inhub 500-50 MCG/ACT Aepb Generic drug: fluticasone -salmeterol Inhale 1 puff into the lungs in the morning and at bedtime.        Follow-up Information     Phyllistine Lenis, MD. Schedule an appointment as soon as possible for a visit in 1  week(s).   Specialty: Internal Medicine Contact information: [address] [place] [address] [phone]         Kate Lonni CROME, MD. Schedule an appointment as soon as possible for a visit.   Specialties: Cardiology, Radiology Contact information: [address] [place] [address] [phone]         Kriss Estefana DEL, DO. Schedule an appointment as soon as possible for a visit.   Specialty: Gastroenterology Why: Will need repeat EGD 4 weeks for further evaluation of esophageal stricture Contact information: [address] [place] [address] [phone]                Allergies  Allergen Reactions   Guaifenesin & Derivatives Shortness Of Breath and Other (See Comments)    Mucinex brand   Pork-Derived Products Other (See Comments)    Patient prefers to not eat any pork/swine    Robafen Dm [Guaifenesin-Dm] Shortness Of Breath   Cymbalta [Duloxetine Hcl] Nausea And Vomiting   Ibuprofen  Other (See Comments)    Upset stomach- abdominal pain   Neurontin [Gabapentin] Other (See Comments)    Reported by [facility] - slowed heart rate, -per pt   Prozac [Fluoxetine Hcl] Other (See Comments)    Loss of appetite, sedation   Tylenol  [Acetaminophen ] Other (See Comments)    Upset stomach   Ampicillin Hives, Nausea Only, Rash and Other (See Comments)    Severe nausea and WELTS   Clindamycin /Lincomycin Swelling, Rash and Other (See Comments)    Had these reactions in feet, ankles and legs.   Penicillins Hives, Rash and Other (See Comments)    And WELTS    Consultations: [facility] gastroenterology Cardiology Psychiatry   Procedures/Studies: DG ESOPHAGUS W SINGLE CM (SOL OR THIN BA) Result Date: 06/25/2024 CLINICAL DATA:  58 year old female with a history of Raynaud's syndrome, fibromyalgia, failure to thrive and significant weight loss over the past several years. Patient reports dull epigastric aching for the  past week but denies any dysphagia, nausea, vomiting, or reflux. EXAM: ESOPHAGUS/BARIUM SWALLOW/TABLET STUDY TECHNIQUE: Single contrast examination was performed using thin liquid barium. This exam was performed by [facility] PA-C, and was supervised and interpreted by Dr. Newell Eke. FLUOROSCOPY: Radiation Exposure Index (as provided by the fluoroscopic device): 4.6 mGy Kerma COMPARISON:  CT chest 06/24/2024. FINDINGS: Swallowing: Not formally assessed. Pharynx: Not formally assessed. Esophagus: Approximate 50% narrowing of the distal esophagus, through which a 13 mm barium tablet would not pass. Esophageal motility: Not assessed due to 45 degree recumbent position. Hiatal Hernia: None visualized. Gastroesophageal reflux: None visualized with coughing or Valsalva. Ingested 13mm barium tablet: Tablet became stuck in the distal esophagus and would not pass with additional water intake or thin barium. Other: Procedure performed at ~ 45 degree table tilt as patient is  unable to lie flat. IMPRESSION: 1. Roughly 50% distal esophageal narrowing, through which a 13 mm barium tablet would not pass. 2. Otherwise limited exam due to patient condition. Electronically Signed   By: Newell Eke M.D.   On: 06/25/2024 14:43   US  CHEST (PLEURAL EFFUSION) Result Date: 06/25/2024 CLINICAL DATA:  Limited ultrasound of the chest done prior to possible thoracentesis. EXAM: CHEST ULTRASOUND COMPARISON:  CTA CHEST - 06/24/24 FINDINGS: No pleural effusions are identified by ultrasound. IMPRESSION: No pleural effusion.  No thoracentesis performed. Electronically Signed   By: Marcey Moan M.D.   On: 06/25/2024 13:19   ECHOCARDIOGRAM COMPLETE Result Date: 06/24/2024    ECHOCARDIOGRAM REPORT   Patient Name:   POPPI SCANTLING Date of Exam: 06/24/2024 Medical Rec #:  [id number]     Height:       62.0 in Accession #:    [id number]    Weight:       97.2 lb Date of Birth:  1966/04/01      BSA:          1.407 m Patient Age:    57  years      BP:           119/71 mmHg Patient Gender: F             HR:           79 bpm. Exam Location:  Inpatient Procedure: 2D Echo, Cardiac Doppler and Color Doppler (Both Spectral and Color            Flow Doppler were utilized during procedure). Indications:    R07.9* Chest pain, unspecified; R06.02 SOB  History:        Patient has prior history of Echocardiogram examinations, most                 recent 05/24/2022. COPD, Aortic Valve Disease,                 Signs/Symptoms:Shortness of Breath and Dyspnea; Risk                 Factors:Current Smoker.  Sonographer:    Ellouise Mose RDCS Referring Phys: [id number] [facility] IMPRESSIONS  1. Suspect bicuspid aortic valve, heavily calcififed and thickened, severe AR, AS likley more moderate. The aortic valve is calcified. Aortic valve regurgitation is severe. Moderate aortic valve stenosis. Aortic valve mean gradient measures 25.0 mmHg. Aortic valve Vmax measures 3.20 m/s.  2. Left ventricular ejection fraction, by estimation, is 65 to 70%. The left ventricle has normal function. The left ventricle has no regional wall motion abnormalities. Left ventricular diastolic parameters are consistent with Grade I diastolic dysfunction (impaired relaxation).  3. Right ventricular systolic function is normal. The right ventricular size is normal.  4. Large pleural effusion.  5. The mitral valve is degenerative. Mild to moderate mitral valve regurgitation. No evidence of mitral stenosis. FINDINGS  Left Ventricle: Left ventricular ejection fraction, by estimation, is 65 to 70%. The left ventricle has normal function. The left ventricle has no regional wall motion abnormalities. The left ventricular internal cavity size was normal in size. There is  no left ventricular hypertrophy. Left ventricular diastolic parameters are consistent with Grade I diastolic dysfunction (impaired relaxation). Right Ventricle: The right ventricular size is normal. No increase in right ventricular wall  thickness. Right ventricular systolic function is normal. Left Atrium: Left atrial size was normal in size. Right Atrium: Right atrial size was normal in size. Pericardium: There is no evidence  of pericardial effusion. Mitral Valve: The mitral valve is degenerative in appearance. Mild to moderate mitral valve regurgitation. No evidence of mitral valve stenosis. Tricuspid Valve: The tricuspid valve is normal in structure. Tricuspid valve regurgitation is mild . No evidence of tricuspid stenosis. Aortic Valve: Suspect bicuspid aortic valve, heavily calcififed and thickened, severe AR, AS likley more moderate. The aortic valve is calcified. Aortic valve regurgitation is severe. Aortic regurgitation PHT measures 310 msec. Moderate aortic stenosis is present. Aortic valve mean gradient measures 25.0 mmHg. Aortic valve peak gradient measures 41.1 mmHg. Aortic valve area, by VTI measures 1.81 cm. Pulmonic Valve: The pulmonic valve was normal in structure. Pulmonic valve regurgitation is not visualized. No evidence of pulmonic stenosis. Aorta: The aortic root and ascending aorta are structurally normal, with no evidence of dilitation. IAS/Shunts: No atrial level shunt detected by color flow Doppler. Additional Comments: There is a large pleural effusion.  LEFT VENTRICLE PLAX 2D LVIDd:         3.70 cm     Diastology LVIDs:         2.40 cm     LV e' medial:    8.05 cm/s LV PW:         1.10 cm     LV E/e' medial:  10.5 LV IVS:        1.00 cm     LV e' lateral:   8.59 cm/s LVOT diam:     2.10 cm     LV E/e' lateral: 9.9 LV SV:         117 LV SV Index:   83 LVOT Area:     3.46 cm  LV Volumes (MOD) LV vol d, MOD A2C: 89.9 ml LV vol d, MOD A4C: 74.8 ml LV vol s, MOD A2C: 32.0 ml LV vol s, MOD A4C: 23.1 ml LV SV MOD A2C:     57.9 ml LV SV MOD A4C:     74.8 ml LV SV MOD BP:      58.3 ml IVC IVC diam: 2.30 cm LEFT ATRIUM           Index        RIGHT ATRIUM           Index LA diam:      2.10 cm 1.49 cm/m   RA Area:     12.10 cm LA  Vol (A2C): 20.3 ml 14.43 ml/m  RA Volume:   29.60 ml  21.04 ml/m LA Vol (A4C): 11.8 ml 8.39 ml/m  AORTIC VALVE AV Area (Vmax):    1.96 cm AV Area (Vmean):   1.81 cm AV Area (VTI):     1.81 cm AV Vmax:           320.40 cm/s AV Vmean:          228.000 cm/s AV VTI:            0.650 m AV Peak Grad:      41.1 mmHg AV Mean Grad:      25.0 mmHg LVOT Vmax:         181.00 cm/s LVOT Vmean:        119.000 cm/s LVOT VTI:          0.339 m LVOT/AV VTI ratio: 0.52 AI PHT:            310 msec  AORTA Ao Root diam: 3.10 cm Ao Asc diam:  3.10 cm MITRAL VALVE  TRICUSPID VALVE MV Area (PHT): 3.37 cm    TR Peak grad:   25.2 mmHg MV Decel Time: 225 msec    TR Vmax:        251.00 cm/s MR Peak grad: 96.4 mmHg MR Mean grad: 52.0 mmHg    SHUNTS MR Vmax:      491.00 cm/s  Systemic VTI:  0.34 m MR Vmean:     327.0 cm/s   Systemic Diam: 2.10 cm MV E velocity: 84.80 cm/s MV A velocity: 73.70 cm/s MV E/A ratio:  1.15 Morene Brownie Electronically signed by Morene Brownie Signature Date/Time: 06/24/2024/5:54:18 PM    Final    CT Angio Chest PE W and/or Wo Contrast Result Date: 06/24/2024 CLINICAL DATA:  Chest pain. EXAM: CT ANGIOGRAPHY CHEST WITH CONTRAST TECHNIQUE: Multidetector CT imaging of the chest was performed using the standard protocol during bolus administration of intravenous contrast. Multiplanar CT image reconstructions and MIPs were obtained to evaluate the vascular anatomy. RADIATION DOSE REDUCTION: This exam was performed according to the departmental dose-optimization program which includes automated exposure control, adjustment of the mA and/or kV according to patient size and/or use of iterative reconstruction technique. CONTRAST:  60mL OMNIPAQUE  IOHEXOL  350 MG/ML SOLN COMPARISON:  None Available. FINDINGS: Cardiovascular: Satisfactory opacification of the pulmonary arteries to the segmental level. No evidence of pulmonary embolism. Normal heart size. No pericardial effusion. Mediastinum/Nodes: No enlarged  mediastinal, hilar, or axillary lymph nodes. Thyroid gland, trachea, and esophagus demonstrate no significant findings. Lungs/Pleura: No pneumothorax or pleural effusion is noted. Mild emphysematous disease is noted in the upper lobes. Minimal bilateral posterior basilar subsegmental atelectasis is noted. Upper Abdomen: No acute abnormality. Musculoskeletal: No chest wall abnormality. No acute or significant osseous findings. Review of the MIP images confirms the above findings. IMPRESSION: No definite evidence of pulmonary embolism. Aortic Atherosclerosis (ICD10-I70.0) and Emphysema (ICD10-J43.9). Electronically Signed   By: Lynwood Landy Raddle M.D.   On: 06/24/2024 09:57   DG Chest 2 View Result Date: 06/23/2024 CLINICAL DATA:  Shortness of breath EXAM: CHEST - 2 VIEW COMPARISON:  Chest x-ray 11/01/2023.  CT of the chest 06/13/2023 FINDINGS: The heart size and mediastinal contours are within normal limits. Nodular density projects over the left costophrenic angle similar to the prior study. This may represent a nipple shadow. The lungs are otherwise clear there is no pleural effusion or pneumothorax. The lungs are hyperinflated, unchanged. The visualized skeletal structures are unremarkable. IMPRESSION: 1. No active cardiopulmonary disease. 2. Hyperinflated lungs, unchanged. 3. Nodular density projects over the left costophrenic angle similar to the prior study. This may represent a nipple shadow. Repeat chest x-ray with nipple markers is recommended. Electronically Signed   By: Greig Pique M.D.   On: 06/23/2024 16:43   CT ABDOMEN PELVIS W CONTRAST Result Date: 06/23/2024 CLINICAL DATA:  Bowel obstruction suspected. Shortness of breath. Failure to thrive. Weight loss. EXAM: CT ABDOMEN AND PELVIS WITH CONTRAST TECHNIQUE: Multidetector CT imaging of the abdomen and pelvis was performed using the standard protocol following bolus administration of intravenous contrast. RADIATION DOSE REDUCTION: This exam was  performed according to the departmental dose-optimization program which includes automated exposure control, adjustment of the mA and/or kV according to patient size and/or use of iterative reconstruction technique. CONTRAST:  OMNIPAQUE  IOHEXOL  300 MG/ML  SOLN COMPARISON:  CT scan abdomen and pelvis from 02/13/2022. FINDINGS: Lower chest: There are patchy atelectatic changes in the visualized lung bases. No overt consolidation. No pleural effusion. The heart is normal in size. No pericardial  effusion. Hepatobiliary: The liver is normal in size. Non-cirrhotic configuration. No suspicious mass. No intrahepatic or extrahepatic bile duct dilation. Gallbladder is surgically absent. Pancreas: Unremarkable. No pancreatic ductal dilatation or surrounding inflammatory changes. Spleen: Within normal limits. No focal lesion. Adrenals/Urinary Tract: Adrenal glands are unremarkable. No suspicious renal mass. There are several hypoattenuating structures in the left kidney with largest measuring up to 8 x 12 mm, which can be characterized as a simple cyst. There are at least 2, 1-1.5 mm nonobstructing calculi in the left kidney and a single 1-1.5 mm nonobstructing calculus in the right kidney lower pole. No hydroureteronephrosis or ureterolithiasis on either side. Urinary bladder is under distended, precluding optimal assessment. However, no large mass or stones identified. No perivesical fat stranding. Stomach/Bowel: No disproportionate dilation of the small or large bowel loops. No evidence of abnormal bowel wall thickening or inflammatory changes. The appendix was not visualized; however there is no acute inflammatory process in the right lower quadrant. Vascular/Lymphatic: No ascites or pneumoperitoneum. No abdominal or pelvic lymphadenopathy, by size criteria. No aneurysmal dilation of the major abdominal arteries. There are moderate peripheral atherosclerotic vascular calcifications of the aorta and its major branches.  Reproductive: Not well evaluated on this CT scan exam. There is probable retroverted uterus noted. No large adnexal mass seen. Other: The visualized soft tissues and abdominal wall are unremarkable. Musculoskeletal: No suspicious osseous lesions. There are mild multilevel degenerative changes in the visualized spine. IMPRESSION: 1. No acute inflammatory process identified within the abdomen or pelvis. No bowel obstruction. 2. Multiple other nonacute observations, as described above. Aortic Atherosclerosis (ICD10-I70.0). Electronically Signed   By: Ree Molt M.D.   On: 06/23/2024 16:39     Subjective: Patient seen examined bedside, sitting in bedside chair.  No complaints this morning and wanting to discharge home.  Seen by cardiology with plan for outpatient cardiac MRI and then follow-up thereafter.  [facility]  No other complaints or concerns at this time.  Underwent ambulatory O2 screening with no desaturation below 92% with exertion.  Denies headache, no visual changes, no chest pain, no palpitations, no current shortness of breath at rest, no abdominal pain, no fever/chills/night sweats, no nausea/vomiting/diarrhea, no focal weakness, no fatigue, no paresthesia.  No acute events overnight per nursing.  Discharge Exam: Vitals:   06/26/24 2102 06/27/24 0458  BP: 102/63 93/68  Pulse: 93 92  Resp: 16 14  Temp: 97.9 F (36.6 C) 97.9 F (36.6 C)  SpO2: 100% 97%   Vitals:   06/26/24 1110 06/26/24 1315 06/26/24 2102 06/27/24 0458  BP: (!) 129/49 100/66 102/63 93/68  Pulse: 70 89 93 92  Resp: 18 16 16 14   Temp:  98 F (36.7 C) 97.9 F (36.6 C) 97.9 F (36.6 C)  TempSrc:  Oral Oral Oral  SpO2: 99% 100% 100% 97%  Weight:      Height:        Physical Exam: GEN: NAD, alert and oriented x 3, chronically ill in appearance HEENT: NCAT, PERRL, EOMI, sclera clear, MMM PULM: CTAB w/o wheezes/crackles, normal respiratory effort, on room  air CV: RRR w/o M/G/R GI: abd soft, NTND, NABS, no R/G/M MSK: no peripheral edema, muscle strength globally intact 5/5 bilateral upper/lower extremities NEURO: CN II-XII intact, no focal deficits, sensation to light touch intact PSYCH: normal mood/affect Integumentary: dry/intact, no rashes or wounds    The results of significant diagnostics from this hospitalization (including imaging, microbiology, ancillary and laboratory) are listed  below for reference.     Microbiology: No results found for this or any previous visit (from the past 240 hours).   Labs: BNP (last 3 results) Recent Labs    11/01/23 0806  BNP 109.1*   Basic Metabolic Panel: Recent Labs  Lab 06/23/24 1328 06/25/24 0953 06/26/24 0638  NA 144 139 140  K 3.9 4.1 4.0  CL 111 105 106  CO2 25 28 28   GLUCOSE 117* 87 84  BUN 13 17 17   CREATININE 0.50 0.37* 0.55  CALCIUM 9.8 9.3 8.7*  MG 2.1  --  2.0  PHOS 2.7  --   --    Liver Function Tests: Recent Labs  Lab 06/23/24 1328  AST 18  ALT 11  ALKPHOS 45  BILITOT 1.0  PROT 6.7  ALBUMIN 3.8   No results for input(s): LIPASE, AMYLASE in the last 168 hours. No results for input(s): AMMONIA in the last 168 hours. CBC: Recent Labs  Lab 06/23/24 1328 06/25/24 0953 06/26/24 0638  WBC 6.8 9.4 5.6  NEUTROABS 5.1  --  3.4  HGB 13.4 12.4 12.7  HCT 43.1 40.3 40.9  MCV 102.6* 104.9* 104.1*  PLT 127* 106* 111*   Cardiac Enzymes: No results for input(s): CKTOTAL, CKMB, CKMBINDEX, TROPONINI in the last 168 hours. BNP: Invalid input(s): POCBNP CBG: No results for input(s): GLUCAP in the last 168 hours. D-Dimer No results for input(s): DDIMER in the last 72 hours. Hgb A1c No results for input(s): HGBA1C in the last 72 hours. Lipid Profile No results for input(s): CHOL, HDL, LDLCALC, TRIG, CHOLHDL, LDLDIRECT in the last 72 hours. Thyroid function studies Recent Labs    06/24/24 1348  TSH 0.361   Anemia work  up Recent Labs    06/24/24 1349  VITAMINB12 345  FOLATE 15.8   Urinalysis    Component Value Date/Time   COLORURINE YELLOW 06/23/2024 1816   APPEARANCEUR HAZY (A) 06/23/2024 1816   LABSPEC >1.046 (H) 06/23/2024 1816   PHURINE 7.0 06/23/2024 1816   GLUCOSEU NEGATIVE 06/23/2024 1816   HGBUR NEGATIVE 06/23/2024 1816   BILIRUBINUR NEGATIVE 06/23/2024 1816   KETONESUR NEGATIVE 06/23/2024 1816   PROTEINUR NEGATIVE 06/23/2024 1816   UROBILINOGEN 1.0 10/01/2015 1225   NITRITE NEGATIVE 06/23/2024 1816   LEUKOCYTESUR NEGATIVE 06/23/2024 1816   Sepsis Labs Recent Labs  Lab 06/23/24 1328 06/25/24 0953 06/26/24 0638  WBC 6.8 9.4 5.6   Microbiology No results found for this or any previous visit (from the past 240 hours).   Time coordinating discharge: Over 30 minutes  SIGNED:   Camellia PARAS [place], DO  [facility] 06/27/2024, 11:17 AM

## 2024-06-27 NOTE — Plan of Care (Signed)

## 2024-06-27 NOTE — Evaluation (Signed)
 Occupational Therapy Evaluation Patient Details Name: Michele Lucas MRN: [id number] DOB: 08-09-1966 Today's Date: 06/27/2024   History of Present Illness   Michele Lucas is a 57 year old female presenting emergency department 06/23/2024 and now admitted with DOE/chest pain, generalized muscle weakness and severe malnutrition. EGD 7/19 revealing esophageal stenosis with balloon dilation performed. PMH: PTSD, fibromyalgia, anxiety, chronic back pain, emphysema.     Clinical Impressions PTA patient independent with mobility using cane, most ADLs but reports her daughter assists with IADLs and showers. Admitted for above and presents with problem list below.  She requires supervision for transfers and mobility, ADLs using RW. On 2L via [place] upon entry but tolerated RA during ADLs and mobility with Spo2 >91%.  HR up to 120 with limited activity. Initiated education on energy conservation techniques. Based on performance today, pt will best benefit from continued OT services acutely and after dc at [facility] level to optimize independence and safety with ADLs, IADLs and mobility.     If plan is discharge home, recommend the following:   A little help with walking and/or transfers;A little help with bathing/dressing/bathroom;Assist for transportation;Assistance with cooking/housework     Functional Status Assessment   Patient has had a recent decline in their functional status and demonstrates the ability to make significant improvements in function in a reasonable and predictable amount of time.     Equipment Recommendations   BSC/3in1     Recommendations for Other Services         Precautions/Restrictions   Precautions Precautions: Fall Recall of Precautions/Restrictions: Intact Precaution/Restrictions Comments: watch HR Restrictions Weight Bearing Restrictions Per Provider Order: No     Mobility Bed Mobility Overal bed mobility: Modified Independent                   Transfers Overall transfer level: Needs assistance Equipment used: Rolling walker (2 wheels) Transfers: Sit to/from Stand Sit to Stand: Supervision                  Balance Overall balance assessment: Mild deficits observed, not formally tested                                         ADL either performed or assessed with clinical judgement   ADL Overall ADL's : Needs assistance/impaired     Grooming: Supervision/safety;Standing           Upper Body Dressing : Set up;Sitting   Lower Body Dressing: Supervision/safety;Sit to/from stand   Toilet Transfer: Supervision/safety;Ambulation Toilet Transfer Details (indicate cue type and reason): RW         Functional mobility during ADLs: Supervision/safety;Rolling walker (2 wheels)       Vision   Vision Assessment?: No apparent visual deficits     Perception         Praxis         Pertinent Vitals/Pain Pain Assessment Pain Assessment: Faces Faces Pain Scale: Hurts even more Pain Location: hips, legs (chronic pain) Pain Descriptors / Indicators: Discomfort Pain Intervention(s): Limited activity within patient's tolerance, Monitored during session, Repositioned     Extremity/Trunk Assessment Upper Extremity Assessment Upper Extremity Assessment: Generalized weakness   Lower Extremity Assessment Lower Extremity Assessment: Defer to PT evaluation       Communication Communication Communication: No apparent difficulties   Cognition Arousal: Alert Behavior During Therapy: WFL for tasks assessed/performed Cognition: No apparent impairments  Following commands: Intact       Cueing  General Comments   Cueing Techniques: Verbal cues  pt on 2L supplemental O2 via [place] 97%,  at rest RA SpO2 97% maintained >91% during mobility.  HR up to 120 with minimal activity.   Exercises     Shoulder Instructions      Home Living Family/patient  expects to be discharged to:: Private residence Living Arrangements: Alone Available Help at Discharge: Family Type of Home: Apartment Home Access: Level entry     Home Layout: One level     Bathroom Shower/Tub: Chief Strategy Officer: Standard     Home Equipment: Agricultural consultant (2 wheels);Cane - single point;Shower seat          Prior Functioning/Environment Prior Level of Function : Needs assist             Mobility Comments: pt reports mod I with SPC for ambulation in home and community ADLs Comments: Daughter provides supervision for showers and assists with household management, meal prep,  IADLs; pt manage basic ADLs    OT Problem List: Decreased strength;Decreased activity tolerance;Impaired balance (sitting and/or standing);Decreased knowledge of use of DME or AE;Decreased knowledge of precautions   OT Treatment/Interventions: Self-care/ADL training;Therapeutic exercise;DME and/or AE instruction;Energy conservation;Therapeutic activities;Patient/family education;Balance training      OT Goals(Current goals can be found in the care plan section)   Acute Rehab OT Goals Patient Stated Goal: home OT Goal Formulation: With patient Time For Goal Achievement: 07/11/24 Potential to Achieve Goals: Good   OT Frequency:  Min 2X/week    Co-evaluation              AM-PAC OT 6 Clicks Daily Activity     Outcome Measure Help from another person eating meals?: None Help from another person taking care of personal grooming?: A Little Help from another person toileting, which includes using toliet, bedpan, or urinal?: A Little Help from another person bathing (including washing, rinsing, drying)?: A Little Help from another person to put on and taking off regular upper body clothing?: A Little Help from another person to put on and taking off regular lower body clothing?: A Little 6 Click Score: 19   End of Session Equipment Utilized During  Treatment: Rolling walker (2 wheels) Nurse Communication: Mobility status  Activity Tolerance: Patient tolerated treatment well Patient left: in chair;with call bell/phone within reach  OT Visit Diagnosis: Other abnormalities of gait and mobility (R26.89);Muscle weakness (generalized) (M62.81)                Time: 9166-9147 OT Time Calculation (min): 19 min Charges:  OT General Charges $OT Visit: 1 Visit OT Evaluation $OT Eval Low Complexity: 1 Low  Etta NOVAK, OT Acute Rehabilitation Services Office [phone] Secure Chat Preferred    Etta GORMAN [place] 06/27/2024, 9:49 AM

## 2024-06-27 NOTE — Plan of Care (Signed)

## 2024-06-28 ENCOUNTER — Telehealth: Payer: Self-pay

## 2024-06-28 DIAGNOSIS — I351 Nonrheumatic aortic (valve) insufficiency: Secondary | ICD-10-CM

## 2024-06-28 DIAGNOSIS — I350 Nonrheumatic aortic (valve) stenosis: Secondary | ICD-10-CM

## 2024-06-28 DIAGNOSIS — I062 Rheumatic aortic stenosis with insufficiency: Secondary | ICD-10-CM

## 2024-06-28 NOTE — Telephone Encounter (Signed)
 Left a message for the pt to call back..   Cardiac MRI order placed and sent to precert.  H/H done 06/26/24 and WNL.

## 2024-06-28 NOTE — Anesthesia Postprocedure Evaluation (Signed)
 Anesthesia Post Note  Patient: Michele Lucas  Procedure(s) Performed: EGD (ESOPHAGOGASTRODUODENOSCOPY) BIOPSY, GI DILATION, ESOPHAGUS     Patient location during evaluation: Endoscopy Anesthesia Type: MAC Level of consciousness: awake and alert Pain management: pain level controlled Vital Signs Assessment: post-procedure vital signs reviewed and stable Respiratory status: spontaneous breathing, nonlabored ventilation, respiratory function stable and patient connected to nasal cannula oxygen Cardiovascular status: stable and blood pressure returned to baseline Postop Assessment: no apparent nausea or vomiting Anesthetic complications: no   No notable events documented.  Last Vitals:  Vitals:   06/26/24 2102 06/27/24 0458  BP: 102/63 93/68  Pulse: 93 92  Resp: 16 14  Temp: 36.6 C 36.6 C  SpO2: 100% 97%    Last Pain:  Vitals:   06/27/24 1021  TempSrc:   PainSc: 8                  Chenika Nevils S

## 2024-06-28 NOTE — Telephone Encounter (Signed)
-----   Message from Shirl Fruits sent at 06/27/2024 10:05 AM EDT ----- Regarding: MRI and follow up Please arrange outpatient cardiac MRI for AI evaluation and then follow up with Dr Kate, per Dr Francyne request. Thanks.

## 2024-06-29 ENCOUNTER — Ambulatory Visit: Payer: Self-pay | Admitting: Cardiovascular Disease

## 2024-06-29 LAB — FANA STAINING PATTERNS: CENTROMERE AB: 3 — ABNORMAL HIGH

## 2024-06-29 LAB — SURGICAL PATHOLOGY

## 2024-06-29 LAB — ANTINUCLEAR ANTIBODIES, IFA: ANA Ab, IFA: POSITIVE — AB

## 2024-06-30 LAB — CARDIOLIPIN ANTIBODIES, IGM+IGG
Anticardiolipin IgG: <9 GPL U/mL (ref 0–14)
Anticardiolipin IgM: <9 [MPL'U]/mL (ref 0–12)

## 2024-06-30 NOTE — Telephone Encounter (Signed)
 Left message for pt to call.

## 2024-07-01 NOTE — Telephone Encounter (Addendum)
 Left message on mobile number stating to call for instructions for MRI.  Pt has been scheduled for 07/08/24 and per appointment notes: appointment was scheduled with the patient.    You are scheduled for Cardiac MRI at the location below.  Please arrive for your appointment at ______________ . ?  [facility] [address] [place], [place] [address] Please take advantage of the free valet parking available at the [facility] and [employer] (Entrance C).  Proceed to the [facility] Radiology Department (First Floor) for check-in.   OR   [facility] [address] [place], [place] [address] Please go to the [facility] and check-in with the desk attendant.   Magnetic resonance imaging (MRI) is a painless test that produces images of the inside of the body without using Xrays.  During an MRI, strong magnets and radio waves work together in a Data processing manager to form detailed images.   MRI images may provide more details about a medical condition than X-rays, CT scans, and ultrasounds can provide.  You may be given earphones to listen for instructions.  You may eat a light breakfast and take medications as ordered with the exception of furosemide , hydrochlorothiazide, chlorthalidone or spironolactone (or any other fluid pill). If you are undergoing a stress MRI, please avoid stimulants for 12 hr prior to test. (I.e. Caffeine, nicotine , chocolate, or antihistamine medications)  If your provider has ordered anti-anxiety medications for this test, then you will need a driver.  An IV will be inserted into one of your veins. Contrast material will be injected into your IV. It will leave your body through your urine within a day. You may be told to drink plenty of fluids to help flush the contrast material out of your system.  You will be asked to remove all metal, including: Watch, jewelry, and other metal objects including hearing aids, hair pieces  and dentures. Also wearable glucose monitoring systems (ie. Freestyle Libre and Omnipods) (Braces and fillings normally are not a problem.)   TEST WILL TAKE APPROXIMATELY 1 HOUR  PLEASE NOTIFY SCHEDULING AT LEAST 24 HOURS IN ADVANCE IF YOU ARE UNABLE TO KEEP YOUR APPOINTMENT. [phone]  For more information and frequently asked questions, please visit our website : [email or web address]  Please call the Cardiac Imaging Nurse Navigators with any questions/concerns. [phone] Office

## 2024-07-08 ENCOUNTER — Other Ambulatory Visit: Payer: Self-pay | Admitting: Cardiovascular Disease

## 2024-07-08 ENCOUNTER — Ambulatory Visit (HOSPITAL_COMMUNITY)
Admission: RE | Admit: 2024-07-08 | Discharge: 2024-07-08 | Disposition: A | Discharge status: Home / Self Care | Payer: Medicare (Managed Care) | Source: Ambulatory Visit | Attending: Cardiovascular Disease | Admitting: Cardiovascular Disease

## 2024-07-08 DIAGNOSIS — I062 Rheumatic aortic stenosis with insufficiency: Secondary | ICD-10-CM

## 2024-07-08 DIAGNOSIS — I351 Nonrheumatic aortic (valve) insufficiency: Secondary | ICD-10-CM | POA: Diagnosis present

## 2024-07-08 DIAGNOSIS — I350 Nonrheumatic aortic (valve) stenosis: Secondary | ICD-10-CM | POA: Diagnosis present

## 2024-08-05 ENCOUNTER — Other Ambulatory Visit: Payer: Self-pay | Admitting: Internal Medicine

## 2024-08-06 ENCOUNTER — Telehealth: Payer: Self-pay

## 2024-08-06 ENCOUNTER — Encounter (HOSPITAL_COMMUNITY): Payer: Self-pay | Admitting: Internal Medicine

## 2024-08-06 ENCOUNTER — Telehealth: Payer: Self-pay | Admitting: Adult Health

## 2024-08-06 NOTE — Telephone Encounter (Signed)
   Patient Name: Michele Lucas  DOB: February 07, 1966 MRN: [id number]  Primary Cardiologist: Lonni LITTIE Nanas, MD  Chart reviewed as part of pre-operative protocol coverage. Given past medical history and time since last visit, based on ACC/AHA guidelines, [name] is at acceptable risk for the planned procedure without further cardiovascular testing.   The patient was advised that if she develops new symptoms prior to surgery to contact our office to arrange for a follow-up visit, and she verbalized understanding.  I will route this recommendation to the requesting party via Epic fax function and remove from pre-op pool.  Please call with questions.  Lamarr Satterfield, NP 08/06/2024, 1:31 PM

## 2024-08-06 NOTE — Telephone Encounter (Signed)
 Per Dr. Kate : the procedure is an EGD, this is a low risk procedure, she just had it done last month as well and did fine. she does not need to be seen, no further cardiac work-up recommended before her procedure.

## 2024-08-06 NOTE — Telephone Encounter (Signed)
   Pre-operative Risk Assessment    Patient Name: Michele Lucas  DOB: [birth year] MRN: [id number]   Date of last office visit: NONE Date of next office visit: NONE   Request for Surgical Clearance    Procedure:  ENDOSCOPY WITH DILATATION  Date of Surgery:  Clearance 08/10/24                                Surgeon:  DR ESTEFANA KEAS Surgeon's Group or Practice Name:  [facility] Phone number:  [phone] Fax number:  [phone]   Type of Clearance Requested:   - Medical    Type of Anesthesia:  PROPOFOL    Additional requests/questions:    SignedLucie DELENA Ku   08/06/2024, 11:57 AM

## 2024-08-06 NOTE — Telephone Encounter (Signed)
   Name: Michele Lucas  DOB: May 03, 1966  MRN: [id number]  Primary Cardiologist: Lonni LITTIE Nanas, MD  Chart reviewed as part of pre-operative protocol coverage. Because of [name]'s past medical history and time since last visit, she will require a follow-up in-office visit in order to better assess preoperative cardiovascular risk.She was recently hospitalized and should have f/u appt with Dr. Nanas first per discharge notes. Do not see appointment in chart.   Pre-op covering staff: - Please schedule appointment and call patient to inform them. If patient already had an upcoming appointment within acceptable timeframe, please add pre-op clearance to the appointment notes so provider is aware. - Please contact requesting surgeon's office via preferred method (i.e, phone, fax) to inform them of need for appointment prior to surgery.    Lamarr Satterfield, NP  08/06/2024, 12:13 PM

## 2024-08-06 NOTE — Telephone Encounter (Signed)
 1st attempt : Called patient, NA, left message to contact office to schedule in office appointment for preop clearance.

## 2024-08-10 ENCOUNTER — Ambulatory Visit (HOSPITAL_BASED_OUTPATIENT_CLINIC_OR_DEPARTMENT_OTHER): Payer: Medicare (Managed Care) | Admitting: Anesthesiology

## 2024-08-10 ENCOUNTER — Encounter (HOSPITAL_COMMUNITY)
Admission: RE | Disposition: A | Discharge status: Home / Self Care | Payer: Self-pay | Source: Home / Self Care | Attending: Internal Medicine

## 2024-08-10 ENCOUNTER — Ambulatory Visit (HOSPITAL_COMMUNITY)
Admission: RE | Admit: 2024-08-10 | Discharge: 2024-08-10 | Disposition: A | Discharge status: Home / Self Care | Payer: Medicare (Managed Care) | Attending: Internal Medicine | Admitting: Internal Medicine

## 2024-08-10 ENCOUNTER — Ambulatory Visit (HOSPITAL_COMMUNITY): Payer: Medicare (Managed Care) | Admitting: Anesthesiology

## 2024-08-10 ENCOUNTER — Encounter (HOSPITAL_COMMUNITY): Payer: Self-pay | Admitting: Internal Medicine

## 2024-08-10 ENCOUNTER — Other Ambulatory Visit: Payer: Self-pay

## 2024-08-10 DIAGNOSIS — K222 Esophageal obstruction: Secondary | ICD-10-CM

## 2024-08-10 DIAGNOSIS — Z87891 Personal history of nicotine dependence: Secondary | ICD-10-CM | POA: Insufficient documentation

## 2024-08-10 DIAGNOSIS — J449 Chronic obstructive pulmonary disease, unspecified: Secondary | ICD-10-CM | POA: Diagnosis not present

## 2024-08-10 DIAGNOSIS — M797 Fibromyalgia: Secondary | ICD-10-CM | POA: Diagnosis not present

## 2024-08-10 DIAGNOSIS — F418 Other specified anxiety disorders: Secondary | ICD-10-CM | POA: Diagnosis not present

## 2024-08-10 DIAGNOSIS — I352 Nonrheumatic aortic (valve) stenosis with insufficiency: Secondary | ICD-10-CM | POA: Diagnosis not present

## 2024-08-10 DIAGNOSIS — F419 Anxiety disorder, unspecified: Secondary | ICD-10-CM | POA: Insufficient documentation

## 2024-08-10 DIAGNOSIS — Z7951 Long term (current) use of inhaled steroids: Secondary | ICD-10-CM | POA: Diagnosis not present

## 2024-08-10 HISTORY — PX: BALLOON DILATION: SHX5330

## 2024-08-10 HISTORY — PX: ESOPHAGOGASTRODUODENOSCOPY: SHX5428

## 2024-08-10 SURGERY — EGD (ESOPHAGOGASTRODUODENOSCOPY)
Anesthesia: Monitor Anesthesia Care

## 2024-08-10 MED ORDER — PROPOFOL 10 MG/ML IV BOLUS
INTRAVENOUS | Status: DC | PRN
Start: 2024-08-10 — End: 2024-08-10
  Administered 2024-08-10: 125 ug/kg/min via INTRAVENOUS
  Administered 2024-08-10: 60 mg via INTRAVENOUS
  Administered 2024-08-10: 30 mg via INTRAVENOUS

## 2024-08-10 MED ORDER — FENTANYL CITRATE (PF) 100 MCG/2ML IJ SOLN
INTRAMUSCULAR | Status: AC
Start: 2024-08-10 — End: 2024-08-10
  Filled 2024-08-10: qty 2

## 2024-08-10 MED ORDER — SODIUM CHLORIDE 0.9 % IV SOLN: INTRAVENOUS | Status: DC

## 2024-08-10 MED ORDER — LACTATED RINGERS IV SOLN: INTRAVENOUS | Status: DC | PRN

## 2024-08-10 MED ORDER — FENTANYL CITRATE (PF) 100 MCG/2ML IJ SOLN
INTRAMUSCULAR | Status: DC | PRN
  Administered 2024-08-10 (×2): 50 ug via INTRAVENOUS

## 2024-08-10 MED ORDER — PHENYLEPHRINE 80 MCG/ML (10ML) SYRINGE FOR IV PUSH (FOR BLOOD PRESSURE SUPPORT)
PREFILLED_SYRINGE | INTRAVENOUS | Status: DC | PRN
  Administered 2024-08-10 (×2): 80 ug via INTRAVENOUS
  Administered 2024-08-10: 40 ug via INTRAVENOUS
  Administered 2024-08-10: 80 ug via INTRAVENOUS

## 2024-08-10 MED ORDER — LIDOCAINE 2% (20 MG/ML) 5 ML SYRINGE
INTRAMUSCULAR | Status: DC | PRN
  Administered 2024-08-10: 60 mg via INTRAVENOUS

## 2024-08-10 NOTE — Anesthesia Procedure Notes (Signed)
 Date/Time: 08/10/2024 10:34 AM  Performed by: Para Jerelene CROME, CRNAOxygen Delivery Method: Nasal cannula Comments: OptiFlow Nasal Cannula.

## 2024-08-10 NOTE — H&P (Signed)
 [facility] Outpatient H&P  Subjective: Michele Lucas is a 58 y.o. female who presents for EGD with dilatation. Known esophageal stenosis on EGD 06/26/24. Planned EGD outpatient at [facility] 08/06/24 though cancelled given increased cardiac risk and need to complete in hospital based setting. Has atypical chest pain, not with exertion, pending further cardiac workup. No significant shortness of breath. No significant abdominal pain. Had dysphagia to chicken and pizza yesterday.  Objective: General: Awake and alert, non-toxic in appearance, cachectic  Cardio: Regular rate and rhythm  Pulm: Clear to auscultation, no conversational dyspnea Abdomen: Soft, non-tender to palpation, bowel sounds appreciated    Assessment:  Dysphagia Esophageal stenosis  Plan:  -Recommend EGD with dilatation, discussed benefits, alternatives, risks of bleeding/infection/perforation/anesthesia, she verbalized understanding and elected to proceed -Further recommendations to follow pending procedure  Estefana Keas, DO [facility]

## 2024-08-10 NOTE — Op Note (Signed)
 [facility] Patient Name: Michele Lucas Procedure Date: 08/10/2024 MRN: [id number] Attending MD: Estefana Keas DO, DO, [id number] Date of Birth: Feb 25, 1966 CSN: [id number] Age: 58 Admit Type: Outpatient Procedure:                Upper GI endoscopy Indications:              For therapy of esophageal stenosis, Esophageal                            dysphagia, Failure to thrive Providers:                Estefana Keas DO, DO, Particia Fischer, RN, Corky Czech, Technician Referring MD:              Medicines:                See the Anesthesia note for documentation of the                            administered medications Complications:            No immediate complications. Estimated Blood Loss:     Estimated blood loss was minimal. Procedure:                Pre-Anesthesia Assessment:                           - ASA Grade Assessment: IV - A patient with severe                            systemic disease that is a constant threat to life.                           - The risks and benefits of the procedure and the                            sedation options and risks were discussed with the                            patient. All questions were answered and informed                            consent was obtained.                           After obtaining informed consent, the endoscope was                            passed under direct vision. Throughout the                            procedure, the patient's blood pressure, pulse, and  oxygen saturations were monitored continuously. The                            [id number]-[id number] ([id number]) Olympus endoscope was introduced                            through the mouth, and advanced to the second part                            of duodenum. The upper GI endoscopy was                            accomplished without difficulty. The patient                            tolerated the  procedure well. Scope In: Scope Out: Findings:      One benign-appearing, intrinsic severe (stenosis; an endoscope cannot       pass) stenosis was found 35 cm from the incisors. This stenosis measured       8 mm (inner diameter) x less than one cm (in length). The stenosis was       traversed after downsizing scope. A TTS dilator was passed through the       scope. Dilation with an 07-17-09 mm x 5.5 cm CRE balloon dilator was       performed to 10 mm. The dilation site was examined and showed mild       mucosal disruption. Estimated blood loss was minimal. Standard endoscope       not able to traverse esophageal stenosis after dilation with CRE balloon       10 mm.      The entire examined stomach was normal.      The examined duodenum was normal. Impression:               - Benign-appearing esophageal stenosis. Dilated.                           - Normal stomach.                           - Normal examined duodenum.                           - No specimens collected. Moderate Sedation:      Monitored anesthesia care provided by anesthesia department. Recommendation:           - Discharge patient to home.                           - Resume previous diet.                           - Continue present medications.                           - Repeat upper endoscopy in 3 weeks for retreatment.                           -  Return to GI office in 2 months.                           - Telephone GI office if symptomatic PRN. Procedure Code(s):        --- Professional ---                           [phone], Esophagogastroduodenoscopy, flexible,                            transoral; with transendoscopic balloon dilation of                            esophagus (less than 30 mm diameter) Diagnosis Code(s):        --- Professional ---                           K22.2, Esophageal obstruction                           R13.14, Dysphagia, pharyngoesophageal phase                           R62.7, Adult  failure to thrive CPT copyright 2022 American Medical Association. All rights reserved. The codes documented in this report are preliminary and upon coder review may  be revised to meet current compliance requirements. Dr Estefana Keas, DO Estefana Keas DO, DO 08/10/2024 11:23:23 AM Number of Addenda: 0

## 2024-08-10 NOTE — Discharge Instructions (Signed)

## 2024-08-10 NOTE — Transfer of Care (Addendum)
 Immediate Anesthesia Transfer of Care Note  Patient: Ellise Kovack  Procedure(s) Performed: EGD (ESOPHAGOGASTRODUODENOSCOPY) BALLOON DILATION  Patient Location: Endoscopy Unit  Anesthesia Type:MAC  Level of Consciousness: awake and patient cooperative  Airway & Oxygen Therapy: Patient Spontanous Breathing  Post-op Assessment: Report given to RN and Post -op Vital signs reviewed and stable  Post vital signs: Reviewed and stable  Last Vitals:  Vitals Value Taken Time  BP 112/59 08/10/24 11:10  Temp 36.4 08/10/24   11:09  Pulse 95 08/10/24 11:11  Resp 20 08/10/24 11:11  SpO2 99 % 08/10/24 11:11  Vitals shown include unfiled device data.  Last Pain:  Vitals:   08/10/24 0900  TempSrc: Temporal  PainSc: 0-No pain      Patients Stated Pain Goal: 0 (08/10/24 0900)  Complications: No notable events documented.

## 2024-08-10 NOTE — Anesthesia Preprocedure Evaluation (Addendum)
 Anesthesia Evaluation  Patient identified by MRN, date of birth, ID band Patient awake    Reviewed: Allergy & Precautions, NPO status , Patient's Chart, lab work & pertinent test results  Airway Mallampati: II  TM Distance: >3 FB Neck ROM: Full    Dental  (+) Edentulous Upper, Edentulous Lower   Pulmonary COPD,  COPD inhaler, former smoker   Pulmonary exam normal        Cardiovascular + Valvular Problems/Murmurs AS and AI  Rhythm:Regular Rate:Normal + Systolic murmurs and + Diastolic murmurs ECHO: 1. Suspect bicuspid aortic valve, heavily calcififed and thickened, severe AR, AS likley more moderate. The aortic valve is calcified. Aortic valve regurgitation is severe. Moderate aortic valve stenosis. Aortic valve mean gradient measures 25. 0 mmHg. Aortic valve Vmax measures 3. 20 m/ s. 2. Left ventricular ejection fraction, by estimation, is 65 to 70% . The left ventricle has normal function. The left ventricle has no regional wall motion abnormalities. Left ventricular diastolic parameters are consistent with Grade I diastolic dysfunction ( impaired relaxation) . 3. Right ventricular systolic function is normal. The right ventricular size is normal. 4. Large pleural effusion. 5. The mitral valve is degenerative. Mild to moderate mitral valve regurgitation. No evidence of mitral stenosis.   Neuro/Psych  PSYCHIATRIC DISORDERS Anxiety Depression     Neuromuscular disease    GI/Hepatic ,,,(+)     substance abuse    Endo/Other  negative endocrine ROS    Renal/GU Renal disease     Musculoskeletal  (+)  Fibromyalgia -, narcotic dependent  Abdominal   Peds  Hematology negative hematology ROS (+)   Anesthesia Other Findings Esophageal stenosis Unintentional weight loss  Reproductive/Obstetrics                              Anesthesia Physical Anesthesia Plan  ASA: 4  Anesthesia Plan: MAC   Post-op  Pain Management:    Induction:   PONV Risk Score and Plan: 2 and Propofol  infusion and Treatment may vary due to age or medical condition  Airway Management Planned: Nasal Cannula  Additional Equipment:   Intra-op Plan:   Post-operative Plan:   Informed Consent: I have reviewed the patients History and Physical, chart, labs and discussed the procedure including the risks, benefits and alternatives for the proposed anesthesia with the patient or authorized representative who has indicated his/her understanding and acceptance.     Dental advisory given  Plan Discussed with: CRNA  Anesthesia Plan Comments:          Anesthesia Quick Evaluation

## 2024-08-11 NOTE — Anesthesia Postprocedure Evaluation (Signed)
 Anesthesia Post Note  Patient: Michele Lucas  Procedure(s) Performed: EGD (ESOPHAGOGASTRODUODENOSCOPY) BALLOON DILATION     Patient location during evaluation: Endoscopy Anesthesia Type: MAC Level of consciousness: awake Pain management: pain level controlled Vital Signs Assessment: post-procedure vital signs reviewed and stable Respiratory status: spontaneous breathing, nonlabored ventilation and respiratory function stable Cardiovascular status: blood pressure returned to baseline and stable Postop Assessment: no apparent nausea or vomiting Anesthetic complications: no   No notable events documented.  Last Vitals:  Vitals:   08/10/24 1120 08/10/24 1130  BP: (!) 103/59 (!) 108/44  Pulse: 85 81  Resp: 14 19  Temp:    SpO2: 97% 96%    Last Pain:  Vitals:   08/10/24 1110  TempSrc:   PainSc: 0-No pain                 Ralyn Stlaurent P Ramonica Grigg

## 2024-08-12 ENCOUNTER — Other Ambulatory Visit: Payer: Self-pay | Admitting: Internal Medicine

## 2024-08-12 ENCOUNTER — Encounter (HOSPITAL_COMMUNITY): Payer: Self-pay | Admitting: Internal Medicine

## 2024-08-30 ENCOUNTER — Ambulatory Visit (HOSPITAL_COMMUNITY): Payer: Medicare (Managed Care) | Admitting: Anesthesiology

## 2024-08-30 ENCOUNTER — Ambulatory Visit (HOSPITAL_COMMUNITY)
Admission: RE | Admit: 2024-08-30 | Discharge: 2024-08-30 | Disposition: A | Discharge status: Home / Self Care | Payer: Medicare (Managed Care) | Attending: Internal Medicine | Admitting: Internal Medicine

## 2024-08-30 ENCOUNTER — Encounter (HOSPITAL_COMMUNITY): Payer: Self-pay | Admitting: Internal Medicine

## 2024-08-30 ENCOUNTER — Other Ambulatory Visit: Payer: Self-pay

## 2024-08-30 ENCOUNTER — Other Ambulatory Visit: Payer: Self-pay | Admitting: Internal Medicine

## 2024-08-30 ENCOUNTER — Encounter (HOSPITAL_COMMUNITY)
Admission: RE | Disposition: A | Discharge status: Home / Self Care | Payer: Self-pay | Source: Home / Self Care | Attending: Internal Medicine

## 2024-08-30 DIAGNOSIS — Z87891 Personal history of nicotine dependence: Secondary | ICD-10-CM | POA: Insufficient documentation

## 2024-08-30 DIAGNOSIS — K2970 Gastritis, unspecified, without bleeding: Secondary | ICD-10-CM

## 2024-08-30 DIAGNOSIS — M797 Fibromyalgia: Secondary | ICD-10-CM | POA: Diagnosis not present

## 2024-08-30 DIAGNOSIS — G8929 Other chronic pain: Secondary | ICD-10-CM | POA: Insufficient documentation

## 2024-08-30 DIAGNOSIS — Z79891 Long term (current) use of opiate analgesic: Secondary | ICD-10-CM | POA: Insufficient documentation

## 2024-08-30 DIAGNOSIS — I351 Nonrheumatic aortic (valve) insufficiency: Secondary | ICD-10-CM

## 2024-08-30 DIAGNOSIS — J449 Chronic obstructive pulmonary disease, unspecified: Secondary | ICD-10-CM | POA: Diagnosis not present

## 2024-08-30 DIAGNOSIS — I38 Endocarditis, valve unspecified: Secondary | ICD-10-CM | POA: Insufficient documentation

## 2024-08-30 DIAGNOSIS — I352 Nonrheumatic aortic (valve) stenosis with insufficiency: Secondary | ICD-10-CM | POA: Insufficient documentation

## 2024-08-30 DIAGNOSIS — K222 Esophageal obstruction: Secondary | ICD-10-CM | POA: Diagnosis present

## 2024-08-30 HISTORY — PX: ESOPHAGOGASTRODUODENOSCOPY: SHX5428

## 2024-08-30 SURGERY — EGD (ESOPHAGOGASTRODUODENOSCOPY)
Anesthesia: Monitor Anesthesia Care

## 2024-08-30 MED ORDER — PROPOFOL 10 MG/ML IV BOLUS
INTRAVENOUS | Status: DC | PRN
  Administered 2024-08-30 (×2): 30 mg via INTRAVENOUS

## 2024-08-30 MED ORDER — SODIUM CHLORIDE 0.9 % IV SOLN: INTRAVENOUS | Status: DC

## 2024-08-30 MED ORDER — OXYCODONE HCL 5 MG/5ML PO SOLN
ORAL | Status: AC
  Filled 2024-08-30: qty 5

## 2024-08-30 MED ORDER — PROPOFOL 500 MG/50ML IV EMUL
INTRAVENOUS | Status: DC | PRN
  Administered 2024-08-30: 80 ug/kg/min via INTRAVENOUS

## 2024-08-30 MED ORDER — SODIUM CHLORIDE 0.9 % IV SOLN
12.5000 mg | INTRAVENOUS | Status: DC | PRN
  Filled 2024-08-30: qty 0.5

## 2024-08-30 MED ORDER — HYDROMORPHONE HCL 1 MG/ML IJ SOLN: 0.2500 mg | INTRAMUSCULAR | Status: DC | PRN

## 2024-08-30 MED ORDER — OXYCODONE HCL 5 MG PO TABS: 5.0000 mg | ORAL_TABLET | Freq: Once | ORAL | Status: AC | PRN

## 2024-08-30 MED ORDER — OXYCODONE HCL 5 MG PO TABS
ORAL_TABLET | ORAL | Status: AC
  Filled 2024-08-30: qty 1

## 2024-08-30 MED ORDER — OXYCODONE HCL 5 MG/5ML PO SOLN
5.0000 mg | Freq: Once | ORAL | Status: AC | PRN
  Administered 2024-08-30: 5 mg via ORAL

## 2024-08-30 MED ORDER — SODIUM CHLORIDE 0.9 % IV SOLN: INTRAVENOUS | Status: DC | PRN

## 2024-08-30 NOTE — H&P (Signed)
 [facility] H&P  Subjective: Michele Lucas is a 58 y.o. female who presents for EGD with dilatation. Known esophageal stenosis. Last EGD 08/10/24. Procedure at hospital given increased cardiac risk, pending further cardiac workup. Has chronic chest pain, shortness of breath. She is concerned about her current weight, she noted that she is eating. She is inquiring about a percutaneous feeding tube, has evaluation by nutritionist in the future.    Objective: General: Awake and alert, non-toxic in appearance Cardio: Regular rate and rhythm  Pulm: Clear to auscultation, no conversational dyspnea Abdomen: Soft, mild tender to palpation, bowel sounds appreciated    Assessment:  Esophageal stenosis  Plan:  -EGD with dilatation today, discussed benefits, alternatives and risks of bleeding/infection/perforation/missed lesion/anesthesia, she verbalized understanding and elected to proceed -Further recommendations to follow pending procedure  Estefana Keas, DO [facility]

## 2024-08-30 NOTE — Discharge Instructions (Signed)

## 2024-08-30 NOTE — Transfer of Care (Signed)
 Immediate Anesthesia Transfer of Care Note  Patient: Michele Lucas  Procedure(s) Performed: EGD (ESOPHAGOGASTRODUODENOSCOPY) Balloon dilation wire-guided  Patient Location: PACU  Anesthesia Type:MAC  Level of Consciousness: awake  Airway & Oxygen Therapy: Patient Spontanous Breathing and Patient connected to face mask oxygen  Post-op Assessment: Report given to RN and Post -op Vital signs reviewed and stable  Post vital signs: Reviewed and stable  Last Vitals:  Vitals Value Taken Time  BP 125/100 08/30/24 08:09  Temp    Pulse 105 08/30/24 08:12  Resp 23 08/30/24 08:12  SpO2 99 % 08/30/24 08:12  Vitals shown include unfiled device data.  Last Pain:  Vitals:   08/30/24 0655  TempSrc: Temporal  PainSc: 0-No pain         Complications: No notable events documented.

## 2024-08-30 NOTE — Op Note (Addendum)
 [facility] Patient Name: Michele Lucas Procedure Date : 08/30/2024 MRN: [id number] Attending MD: Estefana Keas DO, DO, [id number] Date of Birth: 05-11-1966 CSN: [id number] Age: 58 Admit Type: Outpatient Procedure:                Upper GI endoscopy Indications:              For therapy of esophageal stenosis Providers:                Estefana Keas DO, DO, Willy Hummer, RN, Corky Czech, Technician Referring MD:              Medicines:                See the Anesthesia note for documentation of the                            administered medications Complications:            No immediate complications. Estimated Blood Loss:     Estimated blood loss was minimal. Procedure:                Pre-Anesthesia Assessment:                           - ASA Grade Assessment: IV - A patient with severe                            systemic disease that is a constant threat to life.                           - The risks and benefits of the procedure and the                            sedation options and risks were discussed with the                            patient. All questions were answered and informed                            consent was obtained.                           After obtaining informed consent, the endoscope was                            passed under direct vision. Throughout the                            procedure, the patient's blood pressure, pulse, and                            oxygen saturations were monitored continuously. The  [id number]-H190 ([id number]) Olympus endoscope was introduced                            through the mouth, and advanced to the second part                            of duodenum. The upper GI endoscopy was                            accomplished without difficulty. The patient                            tolerated the procedure well. Scope In: Scope Out: Findings:      One  benign-appearing, intrinsic severe (stenosis; an endoscope cannot       pass) stenosis was found 38 to 39 cm from the incisors. This stenosis       measured 8 mm (inner diameter) x 1 cm (in length). The stenosis was       traversed after downsizing scope. A TTS dilator was passed through the       scope. Dilation with an 07-17-09 mm balloon dilator was performed to 10       mm. The dilation site was examined and showed mild mucosal disruption.       Estimated blood loss was minimal.      Mild inflammation characterized by erythema was found in the gastric       antrum.      The examined duodenum was normal. Impression:               - Benign-appearing esophageal stenosis. Dilated.                           - Gastritis.                           - Normal examined duodenum.                           - No specimens collected. Moderate Sedation:      Monitored anesthesia care provided by anesthesia department. Recommendation:           - Discharge patient to home.                           - Resume previous diet.                           - Continue present medications.                           - Follow up with dietician/nutrition therapy as                            planned.                           - Recommend esophageal manometry study given  persistent stenosis despite multiple dilatation                            attempts. Procedure Code(s):        --- Professional ---                           [phone], Esophagogastroduodenoscopy, flexible,                            transoral; with transendoscopic balloon dilation of                            esophagus (less than 30 mm diameter) Diagnosis Code(s):        --- Professional ---                           K22.2, Esophageal obstruction                           K29.70, Gastritis, unspecified, without bleeding CPT copyright 2022 American Medical Association. All rights reserved. The codes documented in this  report are preliminary and upon coder review may  be revised to meet current compliance requirements. Dr Estefana Keas, DO Estefana Keas DO, DO 08/30/2024 8:13:34 AM Number of Addenda: 0

## 2024-08-30 NOTE — Anesthesia Postprocedure Evaluation (Signed)
 Anesthesia Post Note  Patient: Michele Lucas  Procedure(s) Performed: EGD (ESOPHAGOGASTRODUODENOSCOPY) Balloon dilation wire-guided     Patient location during evaluation: PACU Anesthesia Type: MAC Level of consciousness: awake and alert Pain management: pain level controlled Vital Signs Assessment: post-procedure vital signs reviewed and stable Respiratory status: spontaneous breathing, nonlabored ventilation and respiratory function stable Cardiovascular status: blood pressure returned to baseline and stable Postop Assessment: no apparent nausea or vomiting Anesthetic complications: no   No notable events documented.  Last Vitals:  Vitals:   08/30/24 0841 08/30/24 0859  BP:    Pulse: 88   Resp: 20   Temp:    SpO2: 100% 91%    Last Pain:  Vitals:   08/30/24 0859  TempSrc:   PainSc: 8                  Butler Levander Pinal

## 2024-08-30 NOTE — Anesthesia Preprocedure Evaluation (Addendum)
 Anesthesia Evaluation  Patient identified by MRN, date of birth, ID band Patient awake    Reviewed: Allergy & Precautions, NPO status , Patient's Chart, lab work & pertinent test results  Airway Mallampati: II  TM Distance: >3 FB Neck ROM: Full    Dental  (+) Edentulous Upper, Edentulous Lower   Pulmonary COPD,  COPD inhaler, former smoker   Pulmonary exam normal        Cardiovascular + Valvular Problems/Murmurs AS and AI  Rhythm:Regular Rate:Normal + Systolic murmurs and + Diastolic murmurs ECHO: 1. Suspect bicuspid aortic valve, heavily calcififed and thickened, severe AR, AS likley more moderate. The aortic valve is calcified. Aortic valve regurgitation is severe. Moderate aortic valve stenosis. Aortic valve mean gradient measures 25. 0 mmHg. Aortic valve Vmax measures 3. 20 m/ s. 2. Left ventricular ejection fraction, by estimation, is 65 to 70% . The left ventricle has normal function. The left ventricle has no regional wall motion abnormalities. Left ventricular diastolic parameters are consistent with Grade I diastolic dysfunction ( impaired relaxation) . 3. Right ventricular systolic function is normal. The right ventricular size is normal. 4. Large pleural effusion. 5. The mitral valve is degenerative. Mild to moderate mitral valve regurgitation. No evidence of mitral stenosis.   Neuro/Psych  PSYCHIATRIC DISORDERS Anxiety Depression     Neuromuscular disease    GI/Hepatic ,,,(+)     substance abuse    Endo/Other  negative endocrine ROS    Renal/GU Renal disease     Musculoskeletal  (+)  Fibromyalgia -, narcotic dependent  Abdominal   Peds  Hematology negative hematology ROS (+)   Anesthesia Other Findings Esophageal stenosis Unintentional weight loss  Reproductive/Obstetrics                              Anesthesia Physical Anesthesia Plan  ASA: 4  Anesthesia Plan: MAC   Post-op  Pain Management:    Induction:   PONV Risk Score and Plan: 2 and Propofol  infusion and Treatment may vary due to age or medical condition  Airway Management Planned: Nasal Cannula  Additional Equipment:   Intra-op Plan:   Post-operative Plan:   Informed Consent: I have reviewed the patients History and Physical, chart, labs and discussed the procedure including the risks, benefits and alternatives for the proposed anesthesia with the patient or authorized representative who has indicated his/her understanding and acceptance.     Dental advisory given  Plan Discussed with: CRNA  Anesthesia Plan Comments:          Anesthesia Quick Evaluation

## 2024-09-01 ENCOUNTER — Encounter (HOSPITAL_COMMUNITY): Payer: Self-pay | Admitting: Internal Medicine

## 2024-09-08 ENCOUNTER — Ambulatory Visit (HOSPITAL_COMMUNITY)
Admission: RE | Admit: 2024-09-08 | Discharge: 2024-09-08 | Disposition: A | Discharge status: Home / Self Care | Payer: Medicare (Managed Care) | Attending: Internal Medicine | Admitting: Internal Medicine

## 2024-09-08 ENCOUNTER — Encounter (HOSPITAL_COMMUNITY)
Admission: RE | Disposition: A | Discharge status: Home / Self Care | Payer: Self-pay | Source: Home / Self Care | Attending: Internal Medicine

## 2024-09-08 ENCOUNTER — Encounter (HOSPITAL_COMMUNITY): Payer: Self-pay | Admitting: Internal Medicine

## 2024-09-08 DIAGNOSIS — K222 Esophageal obstruction: Secondary | ICD-10-CM | POA: Diagnosis present

## 2024-09-08 HISTORY — PX: ESOPHAGEAL MANOMETRY: SHX5429

## 2024-09-08 SURGERY — MANOMETRY, ESOPHAGUS

## 2024-09-08 MED ORDER — LIDOCAINE VISCOUS HCL 2 % MT SOLN
15.0000 mL | Freq: Once | OROMUCOSAL | Status: DC
  Filled 2024-09-08: qty 15

## 2024-09-08 SURGICAL SUPPLY — 2 items
FACESHIELD LNG OPTICON STERILE (SAFETY) IMPLANT
GLOVE BIO SURGEON STRL SZ8 (GLOVE) ×4 IMPLANT

## 2024-09-08 NOTE — Progress Notes (Signed)
 Esophageal manometry performed per protocol.  Patient tolerated well.

## 2024-09-09 ENCOUNTER — Encounter (HOSPITAL_COMMUNITY): Payer: Self-pay | Admitting: Internal Medicine

## 2024-10-04 HISTORY — PX: GASTROSTOMY W/ FEEDING TUBE: SUR642

## 2024-10-19 ENCOUNTER — Other Ambulatory Visit: Payer: Self-pay | Admitting: Gastroenterology

## 2024-10-27 ENCOUNTER — Encounter (HOSPITAL_COMMUNITY): Payer: Self-pay | Admitting: Gastroenterology

## 2024-10-27 NOTE — Progress Notes (Signed)
 Attempted to obtain medical history for pre op call via telephone, unable to reach at this time. HIPAA compliant voicemail message left requesting return call to pre surgical testing department.

## 2024-11-01 ENCOUNTER — Encounter (HOSPITAL_COMMUNITY): Payer: Self-pay | Admitting: Gastroenterology

## 2024-11-02 ENCOUNTER — Ambulatory Visit (HOSPITAL_COMMUNITY): Payer: Medicare (Managed Care)

## 2024-11-02 ENCOUNTER — Ambulatory Visit (HOSPITAL_COMMUNITY): Payer: Medicare (Managed Care) | Admitting: Anesthesiology

## 2024-11-02 ENCOUNTER — Encounter (HOSPITAL_COMMUNITY): Payer: Self-pay | Admitting: Gastroenterology

## 2024-11-02 ENCOUNTER — Ambulatory Visit (HOSPITAL_COMMUNITY)
Admission: RE | Admit: 2024-11-02 | Discharge: 2024-11-02 | Disposition: A | Discharge status: Home / Self Care | Payer: Medicare (Managed Care) | Attending: Gastroenterology | Admitting: Gastroenterology

## 2024-11-02 ENCOUNTER — Other Ambulatory Visit: Payer: Self-pay

## 2024-11-02 ENCOUNTER — Encounter (HOSPITAL_COMMUNITY)
Admission: RE | Disposition: A | Discharge status: Home / Self Care | Payer: Self-pay | Source: Home / Self Care | Attending: Gastroenterology

## 2024-11-02 DIAGNOSIS — J449 Chronic obstructive pulmonary disease, unspecified: Secondary | ICD-10-CM

## 2024-11-02 DIAGNOSIS — Z79899 Other long term (current) drug therapy: Secondary | ICD-10-CM | POA: Insufficient documentation

## 2024-11-02 DIAGNOSIS — F419 Anxiety disorder, unspecified: Secondary | ICD-10-CM

## 2024-11-02 DIAGNOSIS — K449 Diaphragmatic hernia without obstruction or gangrene: Secondary | ICD-10-CM | POA: Insufficient documentation

## 2024-11-02 DIAGNOSIS — F17210 Nicotine dependence, cigarettes, uncomplicated: Secondary | ICD-10-CM | POA: Insufficient documentation

## 2024-11-02 DIAGNOSIS — Z87891 Personal history of nicotine dependence: Secondary | ICD-10-CM | POA: Diagnosis not present

## 2024-11-02 DIAGNOSIS — Z931 Gastrostomy status: Secondary | ICD-10-CM | POA: Diagnosis not present

## 2024-11-02 DIAGNOSIS — R1310 Dysphagia, unspecified: Secondary | ICD-10-CM | POA: Insufficient documentation

## 2024-11-02 DIAGNOSIS — K2980 Duodenitis without bleeding: Secondary | ICD-10-CM | POA: Insufficient documentation

## 2024-11-02 DIAGNOSIS — Z7951 Long term (current) use of inhaled steroids: Secondary | ICD-10-CM | POA: Insufficient documentation

## 2024-11-02 DIAGNOSIS — K222 Esophageal obstruction: Secondary | ICD-10-CM | POA: Insufficient documentation

## 2024-11-02 HISTORY — PX: ESOPHAGOGASTRODUODENOSCOPY: SHX5428

## 2024-11-02 HISTORY — PX: SAVORY DILATION: SHX5439

## 2024-11-02 SURGERY — EGD (ESOPHAGOGASTRODUODENOSCOPY)
Anesthesia: Monitor Anesthesia Care

## 2024-11-02 MED ORDER — ESMOLOL HCL 100 MG/10ML IV SOLN
INTRAVENOUS | Status: DC | PRN
  Administered 2024-11-02 (×2): 20 mg via INTRAVENOUS

## 2024-11-02 MED ORDER — PHENYLEPHRINE 80 MCG/ML (10ML) SYRINGE FOR IV PUSH (FOR BLOOD PRESSURE SUPPORT)
PREFILLED_SYRINGE | INTRAVENOUS | Status: DC | PRN
  Administered 2024-11-02: 80 ug via INTRAVENOUS

## 2024-11-02 MED ORDER — FENTANYL CITRATE (PF) 100 MCG/2ML IJ SOLN
INTRAMUSCULAR | Status: DC | PRN
Start: 2024-11-02 — End: 2024-11-02
  Administered 2024-11-02: 50 ug via INTRAVENOUS
  Administered 2024-11-02 (×2): 25 ug via INTRAVENOUS

## 2024-11-02 MED ORDER — LIDOCAINE 2% (20 MG/ML) 5 ML SYRINGE
INTRAMUSCULAR | Status: DC | PRN
  Administered 2024-11-02: 40 mg via INTRAVENOUS

## 2024-11-02 MED ORDER — FENTANYL CITRATE (PF) 100 MCG/2ML IJ SOLN
INTRAMUSCULAR | Status: AC
  Filled 2024-11-02: qty 2

## 2024-11-02 MED ORDER — PROPOFOL 500 MG/50ML IV EMUL
INTRAVENOUS | Status: DC | PRN
  Administered 2024-11-02: 180 ug/kg/min via INTRAVENOUS

## 2024-11-02 MED ORDER — PROPOFOL 10 MG/ML IV BOLUS
INTRAVENOUS | Status: DC | PRN
  Administered 2024-11-02 (×2): 20 mg via INTRAVENOUS

## 2024-11-02 MED ORDER — SODIUM CHLORIDE 0.9 % IV SOLN: INTRAVENOUS | Status: DC

## 2024-11-02 NOTE — Progress Notes (Signed)
 Michele Lucas 9:49 AM  Subjective: Patient seen and examined and case discussed with Dr. Kriss and her hospital computer chart reviewed and her case discussed with her family member as well and we rediscussed the case and procedure and answered all of their questions  Objective: Vital signs stable afebrile no acute distress abdomen is sore PEG site okay has some extra bumpers which we discussed  Assessment: Esophageal stricture  Plan: Okay to proceed with endoscopy and probable dilation with anesthesia assistance  [facility]  office [phone] After 5PM or if no answer call [phone]

## 2024-11-02 NOTE — Discharge Instructions (Addendum)
 Call if GI quesYOU HAD AN ENDOSCOPIC PROCEDURE TODAY: Refer to the procedure report and other information in the discharge instructions given to you for any specific questions about what was found during the examination. If this information does not answer your questions, please call [facility] office at [phone] to clarify.   YOU SHOULD EXPECT: Some feelings of bloating in the abdomen. Passage of more gas than usual. Walking can help get rid of the air that was put into your GI tract during the procedure and reduce the bloating. If you had a lower endoscopy (such as a colonoscopy or flexible sigmoidoscopy) you may notice spotting of blood in your stool or on the toilet paper. Some abdominal soreness may be present for a day or two, also.  DIET: Your first meal following the procedure should be a light meal and then it is ok to progress to your normal diet. A half-sandwich or bowl of soup is an example of a good first meal. Heavy or fried foods are harder to digest and may make you feel nauseous or bloated. Drink plenty of fluids but you should avoid alcoholic beverages for 24 hours. If you had a esophageal dilation, please see attached instructions for diet.    ACTIVITY: Your care partner should take you home directly after the procedure. You should plan to take it easy, moving slowly for the rest of the day. You can resume normal activity the day after the procedure however YOU SHOULD NOT DRIVE, use power tools, machinery or perform tasks that involve climbing or major physical exertion for 24 hours (because of the sedation medicines used during the test).   SYMPTOMS TO REPORT IMMEDIATELY: A gastroenterologist can be reached at any hour. Please call [phone]  for any of the following symptoms:  Following lower endoscopy (colonoscopy, flexible sigmoidoscopy) Excessive amounts of blood in the stool  Significant tenderness, worsening of abdominal pains  Swelling of the abdomen that is new, acute   Fever of 100 or higher  Following upper endoscopy (EGD, EUS, ERCP, esophageal dilation) Vomiting of blood or coffee ground material  New, significant abdominal pain  New, significant chest pain or pain under the shoulder blades  Painful or persistently difficult swallowing  New shortness of breath  Black, tarry-looking or red, bloody stools  FOLLOW UP:  If any biopsies were taken you will be contacted by phone or by letter within the next 1-3 weeks. Call [phone]  if you have not heard about the biopsies in 3 weeks.  Please also call with any specific questions about appointments or follow up tests. YOU HAD AN ENDOSCOPIC PROCEDURE TODAY: Refer to the procedure report and other information in the discharge instructions given to you for any specific questions about what was found during the examination. If this information does not answer your questions, please call [facility] office at [phone] to clarify.   YOU SHOULD EXPECT: Some feelings of bloating in the abdomen. Passage of more gas than usual. Walking can help get rid of the air that was put into your GI tract during the procedure and reduce the bloating. If you had a lower endoscopy (such as a colonoscopy or flexible sigmoidoscopy) you may notice spotting of blood in your stool or on the toilet paper. Some abdominal soreness may be present for a day or two, also.  DIET: Your first meal following the procedure should be a light meal and then it is ok to progress to your normal diet. A half-sandwich or bowl of  soup is an example of a good first meal. Heavy or fried foods are harder to digest and may make you feel nauseous or bloated. Drink plenty of fluids but you should avoid alcoholic beverages for 24 hours. If you had a esophageal dilation, please see attached instructions for diet.    ACTIVITY: Your care partner should take you home directly after the procedure. You should plan to take it easy, moving slowly for the rest of the day.  You can resume normal activity the day after the procedure however YOU SHOULD NOT DRIVE, use power tools, machinery or perform tasks that involve climbing or major physical exertion for 24 hours (because of the sedation medicines used during the test).   SYMPTOMS TO REPORT IMMEDIATELY: A gastroenterologist can be reached at any hour. Please call [phone]  for any of the following symptoms:  Following lower endoscopy (colonoscopy, flexible sigmoidoscopy) Excessive amounts of blood in the stool  Significant tenderness, worsening of abdominal pains  Swelling of the abdomen that is new, acute  Fever of 100 or higher  Following upper endoscopy (EGD, EUS, ERCP, esophageal dilation) Vomiting of blood or coffee ground material  New, significant abdominal pain  New, significant chest pain or pain under the shoulder blades  Painful or persistently difficult swallowing  New shortness of breath  Black, tarry-looking or red, bloody stools  FOLLOW UP:  If any biopsies were taken you will be contacted by phone or by letter within the next 1-3 weeks. Call [phone]  if you have not heard about the biopsies in 3 weeks.  Please also call with any specific questions about appointments or follow up tests. tion or problem otherwise clear liquids only for 2 hours and when doing well may slowly advance to full liquids and then soft solids and follow-up with Dr. Kriss in 1 month but call sooner if needed

## 2024-11-02 NOTE — Op Note (Signed)
 [facility] Patient Name: Michele Lucas Procedure Date: 11/02/2024 MRN: [id number] Attending MD: Oliva Boots , MD, [id number] Date of Birth: 1966/04/27 CSN: [id number] Age: 58 Admit Type: Outpatient Procedure:                Upper GI endoscopy Indications:              Dysphagia, For therapy of esophageal stricture Providers:                Oliva Boots, MD, Gregoria Pierce, RN, Curtistine Bishop, Technician Referring MD:              Medicines:                Monitored Anesthesia Care Complications:            No immediate complications. Estimated Blood Loss:     Estimated blood loss was minimal. Procedure:                Pre-Anesthesia Assessment:                           - Prior to the procedure, a History and Physical                            was performed, and patient medications and                            allergies were reviewed. The patient's tolerance of                            previous anesthesia was also reviewed. The risks                            and benefits of the procedure and the sedation                            options and risks were discussed with the patient.                            All questions were answered, and informed consent                            was obtained. Prior Anticoagulants: The patient has                            taken no anticoagulant or antiplatelet agents. ASA                            Grade Assessment: III - A patient with severe                            systemic disease. After reviewing the risks and  benefits, the patient was deemed in satisfactory                            condition to undergo the procedure.                           After obtaining informed consent, the endoscope was                            passed under direct vision. Throughout the                            procedure, the patient's blood pressure, pulse, and                             oxygen saturations were monitored continuously. The                            GIF-[id number] ([id number]) Olympus endoscope was                            introduced through the mouth, and advanced to the                            second part of duodenum. The GIF-H190 ([id number])                            Olympus endoscope was introduced through the and                            advanced to the. The upper GI endoscopy was                            accomplished without difficulty. The patient                            tolerated the procedure well. Scope In: Scope Out: Findings:      The larynx was normal.      A small hiatal hernia was present.      One benign-appearing, intrinsic moderate (circumferential scarring or       stenosis; an endoscope may pass) stenosis was found. The stenosis was       easily traversed with the ultraslim scope. We completed the endoscopy       and then A guidewire was placed under fluoroscopic guidance and the       scope was withdrawn. Dilation was performed with a Savary dilator with       no resistance at 8 mm, 10 mm, 12 mm and 13 mm. Only the last dilator had       a scant amount of heme so we switched to the regular endoscope and the       dilation site was examined following endoscope reinsertion and showed       mild mucosal disruption and moderate improvement in luminal narrowing.       The regular upper endoscope passed easily through the esophagus and  strictured area      The PEG was found on the greater curvature of the stomach. And was       deemed by pushing and pulling a little externally to be of okay tension       and the stomach mucosa was evaluated underneath the balloon without       significant ulcer      Patchy mild inflammation characterized by erythema was found in the       duodenal bulb.      The second portion of the duodenum was normal.      The cardia and gastric fundus were normal on retroflexion. Impression:                - Normal larynx.                           - Small hiatal hernia.                           - Benign-appearing esophageal stenosis. Dilated to                            13 mm.                           - The PEG was found in the stomach.                           - Duodenitis.                           - Normal second portion of the duodenum.                           - No specimens collected. Moderate Sedation:      Not Applicable - Patient had care per Anesthesia. Recommendation:           - Patient has a contact number available for                            emergencies. The signs and symptoms of potential                            delayed complications were discussed with the                            patient. Return to normal activities tomorrow.                            Written discharge instructions were provided to the                            patient.                           - Clear liquid diet for 2 hours. If doing well may  have soft solids or at least try full liquids first                           - Continue present medications.                           - Return to GI clinic in 4 weeks. Follow-up with                            Dr. Kriss                           - Telephone GI clinic if symptomatic PRN. Procedure Code(s):        --- Professional ---                           [phone], Esophagogastroduodenoscopy, flexible,                            transoral; with insertion of guide wire followed by                            passage of dilator(s) through esophagus over guide                            wire Diagnosis Code(s):        --- Professional ---                           K44.9, Diaphragmatic hernia without obstruction or                            gangrene                           K22.2, Esophageal obstruction                           Z93.1, Gastrostomy status                           K29.80, Duodenitis without  bleeding                           R13.10, Dysphagia, unspecified CPT copyright 2022 American Medical Association. All rights reserved. The codes documented in this report are preliminary and upon coder review may  be revised to meet current compliance requirements. Oliva Boots, MD 11/02/2024 11:46:56 AM This report has been signed electronically. Number of Addenda: 0

## 2024-11-02 NOTE — Transfer of Care (Signed)
 Immediate Anesthesia Transfer of Care Note  Patient: Michele Lucas  Procedure(s) Performed: EGD (ESOPHAGOGASTRODUODENOSCOPY) EGD, WITH DILATION USING SAVARY-GILLIARD DILATOR OVER GUIDEWIRE  Patient Location: PACU and Endoscopy Unit  Anesthesia Type:MAC  Level of Consciousness: awake and alert   Airway & Oxygen Therapy: Patient Spontanous Breathing and Patient connected to nasal cannula oxygen  Post-op Assessment: Report given to RN and Post -op Vital signs reviewed and stable  Post vital signs: Reviewed and stable  Last Vitals:  Vitals Value Taken Time  BP 97/54 11/02/24 11:47  Temp    Pulse 106 11/02/24 11:49  Resp 18 11/02/24 11:49  SpO2 100 % 11/02/24 11:49  Vitals shown include unfiled device data.  Last Pain:  Vitals:   11/02/24 0913  TempSrc: Temporal  PainSc: 10-Worst pain ever         Complications: No notable events documented.

## 2024-11-02 NOTE — Anesthesia Preprocedure Evaluation (Signed)
 Anesthesia Evaluation  Patient identified by MRN, date of birth, ID band Patient awake    Reviewed: Allergy & Precautions, NPO status , Patient's Chart, lab work & pertinent test results  Airway Mallampati: III  TM Distance: >3 FB Neck ROM: Full    Dental  (+) Edentulous Upper, Edentulous Lower   Pulmonary COPD, former smoker   breath sounds clear to auscultation       Cardiovascular +CHF  + Valvular Problems/Murmurs AS  Rhythm:Regular Rate:Normal + Systolic murmurs TTE (06/2024): 1. Suspect bicuspid aortic valve, heavily calcififed and thickened,  severe AR, AS likley more moderate. The aortic valve is calcified. Aortic  valve regurgitation is severe. Moderate aortic valve stenosis. Aortic  valve mean gradient measures 25.0 mmHg.  Aortic valve Vmax measures 3.20 m/s.   2. Left ventricular ejection fraction, by estimation, is 65 to 70%. The  left ventricle has normal function. The left ventricle has no regional  wall motion abnormalities. Left ventricular diastolic parameters are  consistent with Grade I diastolic  dysfunction (impaired relaxation).   3. Right ventricular systolic function is normal. The right ventricular  size is normal.   4. Large pleural effusion.   5. The mitral valve is degenerative. Mild to moderate mitral valve  regurgitation. No evidence of mitral stenosis.     Neuro/Psych  PSYCHIATRIC DISORDERS Anxiety        GI/Hepatic   Endo/Other  neg diabetes    Renal/GU Renal disease     Musculoskeletal  (+)  Fibromyalgia -  Abdominal   Peds  Hematology   Anesthesia Other Findings   Reproductive/Obstetrics                              Anesthesia Physical Anesthesia Plan  ASA: 4  Anesthesia Plan: MAC   Post-op Pain Management:    Induction: Intravenous  PONV Risk Score and Plan: 2 and Treatment may vary due to age or medical condition  Airway Management  Planned: Natural Airway and Nasal Cannula  Additional Equipment: None  Intra-op Plan:   Post-operative Plan:   Informed Consent:      Dental advisory given  Plan Discussed with: CRNA  Anesthesia Plan Comments:         Anesthesia Quick Evaluation

## 2024-11-02 NOTE — Anesthesia Postprocedure Evaluation (Signed)
 Anesthesia Post Note  Patient: Michele Lucas  Procedure(s) Performed: EGD (ESOPHAGOGASTRODUODENOSCOPY) EGD, WITH DILATION USING SAVARY-GILLIARD DILATOR OVER GUIDEWIRE     Patient location during evaluation: Endoscopy Anesthesia Type: MAC Level of consciousness: awake Pain management: pain level controlled Vital Signs Assessment: post-procedure vital signs reviewed and stable Respiratory status: spontaneous breathing Cardiovascular status: blood pressure returned to baseline Postop Assessment: no apparent nausea or vomiting Anesthetic complications: no   No notable events documented.  Last Vitals:  Vitals:   11/02/24 1210 11/02/24 1212  BP: 113/62   Pulse: 88 92  Resp: 17 16  Temp:    SpO2: 95% 93%    Last Pain:  Vitals:   11/02/24 1202  TempSrc:   PainSc: 10-Worst pain ever                 Lauraine KATHEE [place]

## 2024-11-03 ENCOUNTER — Encounter (HOSPITAL_COMMUNITY): Payer: Self-pay | Admitting: Gastroenterology

## 2024-12-07 ENCOUNTER — Other Ambulatory Visit: Payer: Self-pay | Admitting: Internal Medicine

## 2024-12-22 ENCOUNTER — Encounter (HOSPITAL_COMMUNITY): Payer: Self-pay | Admitting: Internal Medicine

## 2024-12-23 NOTE — Anesthesia Preprocedure Evaluation (Addendum)
"                                    Anesthesia Evaluation  Patient identified by MRN, date of birth, ID band Patient awake    Reviewed: Allergy & Precautions, NPO status , Patient's Chart, lab work & pertinent test results  Airway Mallampati: II  TM Distance: >3 FB Neck ROM: Full    Dental  (+) Dental Advisory Given, Edentulous Lower, Edentulous Upper   Pulmonary COPD,  COPD inhaler, former smoker   Pulmonary exam normal breath sounds clear to auscultation       Cardiovascular + Valvular Problems/Murmurs AS and AI  Rhythm:Regular Rate:Normal + Systolic murmurs and + Diastolic murmurs Echo 06/24/24: 1. Suspect bicuspid aortic valve, heavily calcififed and thickened,  severe AR, AS likley more moderate. The aortic valve is calcified. Aortic  valve regurgitation is severe. Moderate aortic valve stenosis. Aortic  valve mean gradient measures 25.0 mmHg.  Aortic valve Vmax measures 3.20 m/s.   2. Left ventricular ejection fraction, by estimation, is 65 to 70%. The  left ventricle has normal function. The left ventricle has no regional  wall motion abnormalities. Left ventricular diastolic parameters are  consistent with Grade I diastolic  dysfunction (impaired relaxation).   3. Right ventricular systolic function is normal. The right ventricular  size is normal.   4. Large pleural effusion.   5. The mitral valve is degenerative. Mild to moderate mitral valve  regurgitation. No evidence of mitral stenosis.     Neuro/Psych  PSYCHIATRIC DISORDERS Anxiety Depression    negative neurological ROS     GI/Hepatic Neg liver ROS,GERD  Medicated,,Esophageal stenosis   Endo/Other  negative endocrine ROS    Renal/GU negative Renal ROS     Musculoskeletal  (+)  Fibromyalgia -  Abdominal   Peds  Hematology negative hematology ROS (+)   Anesthesia Other Findings Day of surgery medications reviewed with the patient.  Reproductive/Obstetrics                               Anesthesia Physical Anesthesia Plan  ASA: 4  Anesthesia Plan: MAC   Post-op Pain Management:    Induction: Intravenous  PONV Risk Score and Plan: 2 and TIVA and Treatment may vary due to age or medical condition  Airway Management Planned: Natural Airway and Simple Face Mask  Additional Equipment:   Intra-op Plan:   Post-operative Plan:   Informed Consent: I have reviewed the patients History and Physical, chart, labs and discussed the procedure including the risks, benefits and alternatives for the proposed anesthesia with the patient or authorized representative who has indicated his/her understanding and acceptance.     Dental advisory given  Plan Discussed with: CRNA and Anesthesiologist  Anesthesia Plan Comments: (See PAT note from 1/15)         Anesthesia Quick Evaluation  "

## 2024-12-23 NOTE — Progress Notes (Signed)
 " Case: [id number] Date/Time: 01/04/25 1058   Procedures:      EGD (ESOPHAGOGASTRODUODENOSCOPY)     EGD, WITH DILATION USING SAVARY-GILLIARD DILATOR OVER GUIDEWIRE   Anesthesia type: Monitor Anesthesia Care   Pre-op diagnosis: Esophageal stenosis - K22.2   [facility]   Surgeons: Kriss Estefana DEL, DO       DISCUSSION: Michele Lucas is a 59 yo female with PMH of severe AI, moderate AS, mild-mod mitral regurgitation, HFpEF, COPD, esophageal stenosis s/p multiple dilations, anxiety, obesity.  Patient with esophageal stenosis and is s/p dilation in 06/2024, 08/2024 x 2, and 10/2024. She had a PEG placed some time in September or October. Now scheduled for repeat dilation.  She follows with Cardiology at the [facility]. She has been seen by Cardiology while inpatient at [facility] in July. Echo showed severe AI and moderate AS however it was felt her AI  was in the more moderate range. Cardiac MRI done on 07/08/24 reported moderate AI. She has since been following with Cardiology at the [facility]. Unable to view notes however she has been referred to Cardiothoracic surgery at [facility] and has an appointment on Feb 17.  Per Dr. Kate regarding EGD in September (see 8/29 telephone note):   Per Dr. Kate : the procedure is an EGD, this is a low risk procedure, she just had it done last month as well and did fine. she does not need to be seen, no further cardiac work-up recommended before her procedure.    Reviewed with Dr. Tilford. Ok to proceed.   Cardiac MRI 07/08/24:  IMPRESSION: 1. Normal LV size, no hypertrophy, and normal systolic function (EF 52%)   2.  Normal RV size and systolic function (EF 53%)   3.  Moderate aortic regurgitation (regurgitant fraction 32%)    Echo 06/24/24:  IMPRESSIONS    1. Suspect bicuspid aortic valve, heavily calcififed and thickened, severe AR, AS likley more moderate. The aortic valve is calcified. Aortic valve regurgitation is severe.  Moderate aortic valve stenosis. Aortic valve mean gradient measures 25.0 mmHg. Aortic valve Vmax measures 3.20 m/s.  2. Left ventricular ejection fraction, by estimation, is 65 to 70%. The left ventricle has normal function. The left ventricle has no regional wall motion abnormalities. Left ventricular diastolic parameters are consistent with Grade I diastolic dysfunction (impaired relaxation).  3. Right ventricular systolic function is normal. The right ventricular size is normal.  4. Large pleural effusion.  5. The mitral valve is degenerative. Mild to moderate mitral valve regurgitation. No evidence of mitral stenosis.   EKG 06/24/24:  Sinus rhythm RAE Consider LVH  Past Medical History:  Diagnosis Date   Anxiety    Chronic back pain    COPD, mild (HCC)    Emphysema lung (HCC)    Endometriosis    Fibromyalgia    Grade I diastolic dysfunction 11/26/2021   Kidney stones    Neuropathic pain    PTSD (post-traumatic stress disorder)     Past Surgical History:  Procedure Laterality Date   BALLOON DILATION N/A 08/10/2024   Procedure: BALLOON DILATION;  Surgeon: Kriss Estefana DEL, DO;  [facility];  Service: Gastroenterology;  Laterality: N/A;   BONE BIOPSY  06/26/2024   Procedure: BIOPSY, GI;  Surgeon: Kriss Estefana DEL, DO;  [facility];  Service: Gastroenterology;;   CESAREAN SECTION     copolscopy     ESOPHAGEAL DILATION  06/26/2024   Procedure: DILATION, ESOPHAGUS;  Surgeon: Kriss Estefana  H, DO;  [facility];  Service: Gastroenterology;;   ESOPHAGEAL MANOMETRY N/A 09/08/2024   Procedure: MANOMETRY, ESOPHAGUS;  Surgeon: Kriss Estefana DEL, DO;  [facility];  Service: Gastroenterology;  Laterality: N/A;   ESOPHAGOGASTRODUODENOSCOPY N/A 06/26/2024   Procedure: EGD (ESOPHAGOGASTRODUODENOSCOPY);  Surgeon: Kriss Estefana DEL, DO;  Location: [facility];  Service: Gastroenterology;  Laterality: N/A;    ESOPHAGOGASTRODUODENOSCOPY N/A 08/10/2024   Procedure: EGD (ESOPHAGOGASTRODUODENOSCOPY);  Surgeon: Kriss Estefana DEL, DO;  Location: [facility];  Service: Gastroenterology;  Laterality: N/A;   ESOPHAGOGASTRODUODENOSCOPY N/A 08/30/2024   Procedure: EGD (ESOPHAGOGASTRODUODENOSCOPY);  Surgeon: Kriss Estefana DEL, DO;  Location: [facility] END[facility]COPY;  Service: Gastroenterology;  Laterality: N/A;   ESOPHAGOGASTRODUODENOSCOPY N/A 11/02/2024   Procedure: EGD (ESOPHAGOGASTRODUODENOSCOPY);  Surgeon: Rosalie Kitchens, MD;  Location: [facility];  Service: Gastroenterology;  Laterality: N/A;   GASTROSTOMY W/ FEEDING TUBE  10/04/2024   [facility]   SAVORY DILATION N/A 11/02/2024   Procedure: EGD, WITH DILATION USING SAVARY-GILLIARD DILATOR OVER GUIDEWIRE;  Surgeon: Rosalie Kitchens, MD;  [facility];  Service: Gastroenterology;  Laterality: N/A;   TONSILLECTOMY     TUBAL LIGATION      MEDICATIONS:  albuterol  (PROVENTIL  HFA;VENTOLIN  HFA) 108 (90 Base) MCG/ACT inhaler   albuterol  (PROVENTIL ) (2.5 MG/3ML) 0.083% nebulizer solution   benzonatate  (TESSALON ) 200 MG capsule   fluticasone -salmeterol (WIXELA INHUB) 500-50 MCG/ACT AEPB   hydrOXYzine  (ATARAX ) 25 MG tablet   lactose free nutrition (BOOST PLUS) LIQD   loperamide  (IMODIUM ) 2 MG capsule   mirtazapine  (REMERON  SOL-TAB) 15 MG disintegrating tablet   oxyCODONE  (OXY IR/ROXICODONE ) 5 MG immediate release tablet   pantoprazole  (PROTONIX ) 40 MG tablet   predniSONE  (DELTASONE ) 10 MG tablet   Tiotropium Bromide  Monohydrate (SPIRIVA  RESPIMAT) 1.25 MCG/ACT AERS   Burnard CHRISTELLA Odis DEVONNA [facility] Short Stay/Anesthesiology [facility] Phone [phone] 12/23/2024 12:06 PM         "

## 2025-01-04 ENCOUNTER — Ambulatory Visit (HOSPITAL_COMMUNITY): Payer: Self-pay | Admitting: Anesthesiology

## 2025-01-04 ENCOUNTER — Ambulatory Visit (HOSPITAL_COMMUNITY)
Admission: RE | Admit: 2025-01-04 | Discharge: 2025-01-04 | Disposition: A | Discharge status: Home / Self Care | Attending: Internal Medicine | Admitting: Internal Medicine

## 2025-01-04 ENCOUNTER — Other Ambulatory Visit: Payer: Self-pay

## 2025-01-04 ENCOUNTER — Encounter (HOSPITAL_COMMUNITY): Payer: Self-pay | Admitting: Internal Medicine

## 2025-01-04 ENCOUNTER — Encounter (HOSPITAL_COMMUNITY)
Admission: RE | Disposition: A | Discharge status: Home / Self Care | Payer: Self-pay | Source: Home / Self Care | Attending: Internal Medicine

## 2025-01-04 DIAGNOSIS — K219 Gastro-esophageal reflux disease without esophagitis: Secondary | ICD-10-CM | POA: Insufficient documentation

## 2025-01-04 DIAGNOSIS — M797 Fibromyalgia: Secondary | ICD-10-CM | POA: Diagnosis not present

## 2025-01-04 DIAGNOSIS — F419 Anxiety disorder, unspecified: Secondary | ICD-10-CM

## 2025-01-04 DIAGNOSIS — I352 Nonrheumatic aortic (valve) stenosis with insufficiency: Secondary | ICD-10-CM | POA: Insufficient documentation

## 2025-01-04 DIAGNOSIS — K222 Esophageal obstruction: Secondary | ICD-10-CM

## 2025-01-04 DIAGNOSIS — I5032 Chronic diastolic (congestive) heart failure: Secondary | ICD-10-CM | POA: Insufficient documentation

## 2025-01-04 DIAGNOSIS — F32A Depression, unspecified: Secondary | ICD-10-CM | POA: Diagnosis not present

## 2025-01-04 DIAGNOSIS — J449 Chronic obstructive pulmonary disease, unspecified: Secondary | ICD-10-CM | POA: Diagnosis not present

## 2025-01-04 DIAGNOSIS — I080 Rheumatic disorders of both mitral and aortic valves: Secondary | ICD-10-CM | POA: Diagnosis not present

## 2025-01-04 DIAGNOSIS — Z87891 Personal history of nicotine dependence: Secondary | ICD-10-CM

## 2025-01-04 DIAGNOSIS — Z931 Gastrostomy status: Secondary | ICD-10-CM | POA: Insufficient documentation

## 2025-01-04 MED ORDER — PROPOFOL 500 MG/50ML IV EMUL
INTRAVENOUS | Status: DC | PRN
  Administered 2025-01-04: 160 ug/kg/min via INTRAVENOUS

## 2025-01-04 MED ORDER — LIDOCAINE 2% (20 MG/ML) 5 ML SYRINGE
INTRAMUSCULAR | Status: DC | PRN
  Administered 2025-01-04: 100 mg via INTRAVENOUS

## 2025-01-04 MED ORDER — FENTANYL CITRATE (PF) 100 MCG/2ML IJ SOLN
50.0000 ug | Freq: Once | INTRAMUSCULAR | Status: AC
  Administered 2025-01-04: 50 ug via INTRAVENOUS

## 2025-01-04 MED ORDER — SODIUM CHLORIDE 0.9 % IV SOLN: INTRAVENOUS | Status: DC

## 2025-01-04 MED ORDER — FENTANYL CITRATE (PF) 100 MCG/2ML IJ SOLN
INTRAMUSCULAR | Status: AC
  Filled 2025-01-04: qty 2

## 2025-01-04 MED ORDER — ESMOLOL HCL 100 MG/10ML IV SOLN
INTRAVENOUS | Status: DC | PRN
  Administered 2025-01-04: 20 mg via INTRAVENOUS
  Administered 2025-01-04: 30 mg via INTRAVENOUS

## 2025-01-04 MED ORDER — ONDANSETRON HCL 4 MG/2ML IJ SOLN
INTRAMUSCULAR | Status: AC
  Filled 2025-01-04: qty 2

## 2025-01-04 MED ORDER — ONDANSETRON HCL 4 MG/2ML IJ SOLN
4.0000 mg | Freq: Once | INTRAMUSCULAR | Status: AC
  Administered 2025-01-04: 4 mg via INTRAVENOUS

## 2025-01-04 NOTE — H&P (Signed)
 [facility] H&P  Subjective: Michele Lucas is a 59 y.o. female who presents for EGD with dilatation. Multiple previous EGD for dilatation of benign esophageal stenosis, most recent EGD 11/02/24 with Dr. Rosalie for Savary dilation with fluoroscopy, dilated to 13 mm. [facility], having some pain. She has been recommended to use 4 cans formula per day with PEG tube, she has not been able to complete this on a daily basis given inability to administer formula herself. She is attempting oral intake, doing well. She has both chronic chest pain and shortness of breath.   Objective: General: Awake and alert, non-toxic in appearance, cachexia  Cardio: Tachycardic rate and regular rhythm  Pulm: Clear to auscultation, no conversational dyspnea Abdomen: Soft, mildly tender to palpation surrounding PEG tube, PEG tube clean/dry/intact   Assessment:  Esophageal stenosis Protein calorie malnutrition  Plan:  -EGD with dilation today, plan for Savary dilation, discussed risks of procedure including bleeding/infection/perforation/missed lesion/anesthesia, she verbalized understanding and elected to proceed -Recommended [facility] -Further recommendations to follow pending EGD  Estefana Keas, DO [facility] Gastroenterology

## 2025-01-04 NOTE — Transfer of Care (Signed)
 Immediate Anesthesia Transfer of Care Note  Patient: Michele Lucas  Procedure(s) Performed: EGD (ESOPHAGOGASTRODUODENOSCOPY) EGD, WITH DILATION USING SAVARY-GILLIARD DILATOR OVER GUIDEWIRE  Patient Location: PACU and Endoscopy Unit  Anesthesia Type:MAC  Level of Consciousness: awake and alert   Airway & Oxygen Therapy: Patient Spontanous Breathing  Post-op Assessment: Report given to RN and Post -op Vital signs reviewed and stable  Post vital signs: Reviewed and stable  Last Vitals:  Vitals Value Taken Time  BP 117/60 01/04/25 11:13  Temp    Pulse 117 01/04/25 11:16  Resp 28 01/04/25 11:16  SpO2 93 % 01/04/25 11:16  Vitals shown include unfiled device data.  Last Pain:  Vitals:   01/04/25 1023  TempSrc: Temporal  PainSc: 0-No pain      Patients Stated Pain Goal: 0 (01/04/25 1023)  Complications: No notable events documented.

## 2025-01-04 NOTE — Op Note (Signed)
 [facility] Patient Name: Michele Lucas Procedure Date: 01/04/2025 MRN: [id number] Attending MD: Estefana Keas DO, DO, [id number] Date of Birth: 07-Sep-1966 CSN: [id number] Age: 59 Admit Type: Ambulatory Procedure:                Upper GI endoscopy Indications:              For therapy of esophageal stenosis Providers:                Estefana Keas DO, DO, Hoy Penner, RN, Fairy Marina, Technician Referring MD:              Medicines:                See the Anesthesia note for documentation of the                            administered medications Complications:            No immediate complications. Estimated Blood Loss:     Estimated blood loss was minimal. Procedure:                Pre-Anesthesia Assessment:                           - ASA Grade Assessment: IV - A patient with severe                            systemic disease that is a constant threat to life.                           - The risks and benefits of the procedure and the                            sedation options and risks were discussed with the                            patient. All questions were answered and informed                            consent was obtained.                           After obtaining informed consent, the endoscope was                            passed under direct vision. Throughout the                            procedure, the patient's blood pressure, pulse, and                            oxygen saturations were monitored continuously. The  [id number]-[id number] ([id number]) Olympus endoscope was introduced                            through the mouth, and advanced to the second part                            of duodenum. The upper GI endoscopy was                            accomplished without difficulty. The patient                            tolerated the procedure well. Scope In: Scope Out: Findings:      One  benign-appearing, intrinsic moderate (circumferential scarring or       stenosis; an endoscope may pass) stenosis was found in the distal       esophagus. This stenosis measured 9 mm (inner diameter) x less than one       cm (in length). The stenosis was traversed. A guidewire was placed and       the scope was withdrawn. Dilation was performed with a Savary dilator       with no resistance at 11 mm and 12 mm and mild resistance at 13 mm and       14 mm. The dilation site was examined following endoscope reinsertion       and showed moderate mucosal disruption and mild improvement in luminal       narrowing. Mucosal disruption was also noted in middle esophagus.      Scattered minimal inflammation characterized by erythema was found in       the gastric antrum.      There was evidence of a gastrostomy present in the gastric body. This       was characterized by edema.      The examined duodenum was normal. Impression:               - Benign-appearing esophageal stenosis. Dilated.                           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                           - Normal examined duodenum.                           - No specimens collected. Moderate Sedation:      Monitored anesthesia care provided by anesthesia department. Recommendation:           - Discharge patient to home.                           - Resume previous diet.                           - Continue present medications.                           - [facility].  [facility] discomfort.                           - Repeat upper endoscopy in 2 months for                            retreatment. Procedure Code(s):        --- Professional ---                           [phone], Esophagogastroduodenoscopy, flexible,                            transoral; with insertion of guide wire followed by                            passage of  dilator(s) through esophagus over guide                            wire Diagnosis Code(s):        --- Professional ---                           K22.2, Esophageal obstruction                           Z93.1, Gastrostomy status CPT copyright 2022 [employer]. All rights reserved. The codes documented in this report are preliminary and upon coder review may  be revised to meet current compliance requirements. Dr Estefana Keas, DO Estefana Keas DO, DO 01/04/2025 11:19:00 AM Number of Addenda: 0

## 2025-01-04 NOTE — Discharge Instructions (Signed)

## 2025-01-04 NOTE — Anesthesia Postprocedure Evaluation (Signed)
"   Anesthesia Post Note  Patient: Michele Lucas  Procedure(s) Performed: EGD (ESOPHAGOGASTRODUODENOSCOPY) EGD, WITH DILATION USING SAVARY-GILLIARD DILATOR OVER GUIDEWIRE     Patient location during evaluation: Endoscopy Anesthesia Type: MAC Level of consciousness: oriented, awake and alert and awake Pain management: pain level controlled Vital Signs Assessment: post-procedure vital signs reviewed and stable Respiratory status: spontaneous breathing, nonlabored ventilation and respiratory function stable Cardiovascular status: blood pressure returned to baseline and stable Postop Assessment: no headache, no backache and no apparent nausea or vomiting Anesthetic complications: no   No notable events documented.  Last Vitals:  Vitals:   01/04/25 1140 01/04/25 1150  BP: (!) 118/94 123/72  Pulse: (!) 108 99  Resp: 17 12  Temp:    SpO2: 95% 95%    Last Pain:  Vitals:   01/04/25 1150  TempSrc:   PainSc: 3                  Garnette FORBES [place]      "

## 2025-01-05 ENCOUNTER — Telehealth (HOSPITAL_COMMUNITY): Payer: Self-pay

## 2025-01-05 NOTE — Telephone Encounter (Signed)
 Called pt for post-op follow-up call. Pt reports that she had a 6/10 headache upon waking up this morning, worsened by looking down, improved with acetaminophen  and rest. She states that this has not occurred with previous procedures. Instructed pt to maintain adequate hydration, use acetaminophen  as directed, and rest. Pt demonstrated understanding  She also reports that she had pain with swallowing last night after she came home from the hospital. She states that this also has not occurred with previous procedures. She states that this has been improving with rest, no difficulty swallowing or tolerating liquids/solids at this time.   Instructed pt to contact MD Vreeland's office if sx persist or worsen. Instructed pt that if there is a sudden severe worsening of sx, to seek care in the emergency department. Pt demonstrated understanding.  MD Kriss notified of above, acknowledged update.  Ozell VEAR Pouch, RN 01/05/25 10:31 AM

## 2025-01-06 ENCOUNTER — Encounter (HOSPITAL_COMMUNITY): Payer: Self-pay | Admitting: Internal Medicine
